# Patient Record
Sex: Female | Born: 1945 | Race: White | Hispanic: No | Marital: Married | Smoking: Never smoker
Health system: Southern US, Community
[De-identification: ages and names within clinical notes are randomized; demographics above are authoritative.]

## PROBLEM LIST (undated history)

## (undated) ENCOUNTER — Inpatient Hospital Stay: Payer: Medicare Other

## (undated) DIAGNOSIS — I509 Heart failure, unspecified: Secondary | ICD-10-CM

## (undated) DIAGNOSIS — C801 Malignant (primary) neoplasm, unspecified: Secondary | ICD-10-CM

## (undated) DIAGNOSIS — B019 Varicella without complication: Secondary | ICD-10-CM

## (undated) DIAGNOSIS — I00 Rheumatic fever without heart involvement: Secondary | ICD-10-CM

## (undated) DIAGNOSIS — E119 Type 2 diabetes mellitus without complications: Secondary | ICD-10-CM

## (undated) DIAGNOSIS — I219 Acute myocardial infarction, unspecified: Secondary | ICD-10-CM

## (undated) DIAGNOSIS — G4730 Sleep apnea, unspecified: Secondary | ICD-10-CM

## (undated) DIAGNOSIS — N390 Urinary tract infection, site not specified: Secondary | ICD-10-CM

## (undated) DIAGNOSIS — Z9861 Coronary angioplasty status: Secondary | ICD-10-CM

## (undated) DIAGNOSIS — C50919 Malignant neoplasm of unspecified site of unspecified female breast: Secondary | ICD-10-CM

## (undated) DIAGNOSIS — I519 Heart disease, unspecified: Secondary | ICD-10-CM

## (undated) DIAGNOSIS — I2510 Atherosclerotic heart disease of native coronary artery without angina pectoris: Secondary | ICD-10-CM

## (undated) DIAGNOSIS — N1831 Chronic kidney disease, stage 3a: Secondary | ICD-10-CM

## (undated) DIAGNOSIS — K219 Gastro-esophageal reflux disease without esophagitis: Secondary | ICD-10-CM

## (undated) DIAGNOSIS — Z973 Presence of spectacles and contact lenses: Secondary | ICD-10-CM

## (undated) DIAGNOSIS — M8580 Other specified disorders of bone density and structure, unspecified site: Secondary | ICD-10-CM

## (undated) DIAGNOSIS — J449 Chronic obstructive pulmonary disease, unspecified: Secondary | ICD-10-CM

## (undated) DIAGNOSIS — M1990 Unspecified osteoarthritis, unspecified site: Secondary | ICD-10-CM

## (undated) DIAGNOSIS — E785 Hyperlipidemia, unspecified: Secondary | ICD-10-CM

## (undated) DIAGNOSIS — I10 Essential (primary) hypertension: Secondary | ICD-10-CM

## (undated) DIAGNOSIS — K635 Polyp of colon: Secondary | ICD-10-CM

## (undated) DIAGNOSIS — I208 Other forms of angina pectoris: Secondary | ICD-10-CM

## (undated) DIAGNOSIS — T7840XA Allergy, unspecified, initial encounter: Secondary | ICD-10-CM

## (undated) DIAGNOSIS — T8859XA Other complications of anesthesia, initial encounter: Secondary | ICD-10-CM

## (undated) HISTORY — DX: Urinary tract infection, site not specified: N39.0

## (undated) HISTORY — DX: Malignant (primary) neoplasm, unspecified: C80.1

## (undated) HISTORY — DX: Unspecified osteoarthritis, unspecified site: M19.90

## (undated) HISTORY — PX: TUBAL LIGATION: SHX77

## (undated) HISTORY — DX: Essential (primary) hypertension: I10

## (undated) HISTORY — DX: Heart disease, unspecified: I51.9

## (undated) HISTORY — PX: TONSILLECTOMY: SUR1361

## (undated) HISTORY — DX: Other specified disorders of bone density and structure, unspecified site: M85.80

## (undated) HISTORY — DX: Polyp of colon: K63.5

## (undated) HISTORY — DX: Rheumatic fever without heart involvement: I00

## (undated) HISTORY — DX: Hyperlipidemia, unspecified: E78.5

## (undated) HISTORY — DX: Varicella without complication: B01.9

## (undated) HISTORY — DX: Chronic obstructive pulmonary disease, unspecified: J44.9

## (undated) HISTORY — PX: BREAST BIOPSY: SHX20

## (undated) HISTORY — DX: Heart failure, unspecified: I50.9

## (undated) HISTORY — DX: Allergy, unspecified, initial encounter: T78.40XA

## (undated) HISTORY — PX: BREAST SURGERY: SHX581

## (undated) HISTORY — DX: Atherosclerotic heart disease of native coronary artery without angina pectoris: I25.10

## (undated) HISTORY — DX: Gastro-esophageal reflux disease without esophagitis: K21.9

---
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
## 1989-11-16 DIAGNOSIS — C50919 Malignant neoplasm of unspecified site of unspecified female breast: Secondary | ICD-10-CM

## 1989-11-16 DIAGNOSIS — C801 Malignant (primary) neoplasm, unspecified: Secondary | ICD-10-CM

## 1989-11-16 HISTORY — DX: Malignant neoplasm of unspecified site of unspecified female breast: C50.919

## 1989-11-16 HISTORY — PX: MASTECTOMY: SHX3

## 1989-11-16 HISTORY — DX: Malignant (primary) neoplasm, unspecified: C80.1

## 2004-09-30 ENCOUNTER — Ambulatory Visit: Payer: Self-pay

## 2004-10-31 ENCOUNTER — Ambulatory Visit: Payer: Self-pay

## 2005-07-02 ENCOUNTER — Encounter: Payer: Self-pay | Admitting: General Practice

## 2005-07-17 ENCOUNTER — Encounter: Payer: Self-pay | Admitting: General Practice

## 2005-12-23 ENCOUNTER — Ambulatory Visit: Payer: Self-pay | Admitting: Family Medicine

## 2006-01-27 ENCOUNTER — Ambulatory Visit: Payer: Self-pay | Admitting: Otolaryngology

## 2006-09-27 ENCOUNTER — Ambulatory Visit: Payer: Self-pay | Admitting: Gynecology

## 2006-10-18 ENCOUNTER — Ambulatory Visit: Payer: Self-pay | Admitting: Gynecology

## 2006-11-16 DIAGNOSIS — I2510 Atherosclerotic heart disease of native coronary artery without angina pectoris: Secondary | ICD-10-CM

## 2006-11-16 HISTORY — DX: Atherosclerotic heart disease of native coronary artery without angina pectoris: I25.10

## 2007-02-10 ENCOUNTER — Ambulatory Visit: Payer: Self-pay | Admitting: Internal Medicine

## 2007-02-11 ENCOUNTER — Other Ambulatory Visit: Payer: Self-pay

## 2007-02-11 ENCOUNTER — Inpatient Hospital Stay: Payer: Self-pay | Admitting: Internal Medicine

## 2007-02-13 ENCOUNTER — Other Ambulatory Visit: Payer: Self-pay

## 2007-02-13 ENCOUNTER — Inpatient Hospital Stay: Payer: Self-pay | Admitting: Internal Medicine

## 2007-03-02 ENCOUNTER — Ambulatory Visit: Payer: Self-pay | Admitting: Gastroenterology

## 2007-03-03 ENCOUNTER — Ambulatory Visit: Payer: Self-pay | Admitting: Gastroenterology

## 2007-03-17 ENCOUNTER — Encounter: Payer: Self-pay | Admitting: Internal Medicine

## 2007-04-17 ENCOUNTER — Encounter: Payer: Self-pay | Admitting: Internal Medicine

## 2007-05-17 ENCOUNTER — Encounter: Payer: Self-pay | Admitting: Internal Medicine

## 2007-06-16 ENCOUNTER — Ambulatory Visit: Payer: Self-pay | Admitting: Internal Medicine

## 2007-06-17 ENCOUNTER — Encounter: Payer: Self-pay | Admitting: Internal Medicine

## 2007-07-18 ENCOUNTER — Encounter: Payer: Self-pay | Admitting: Internal Medicine

## 2007-08-04 ENCOUNTER — Ambulatory Visit: Payer: Self-pay | Admitting: Family Medicine

## 2007-08-17 ENCOUNTER — Encounter: Payer: Self-pay | Admitting: Internal Medicine

## 2007-10-04 ENCOUNTER — Ambulatory Visit: Payer: Self-pay | Admitting: Urology

## 2007-10-12 ENCOUNTER — Inpatient Hospital Stay: Payer: Self-pay | Admitting: Internal Medicine

## 2007-10-12 ENCOUNTER — Other Ambulatory Visit: Payer: Self-pay

## 2007-10-20 ENCOUNTER — Other Ambulatory Visit: Payer: Self-pay

## 2007-10-21 ENCOUNTER — Ambulatory Visit: Payer: Self-pay | Admitting: Family Medicine

## 2007-11-01 ENCOUNTER — Ambulatory Visit: Payer: Self-pay | Admitting: Family Medicine

## 2007-11-16 ENCOUNTER — Ambulatory Visit: Payer: Self-pay | Admitting: Family Medicine

## 2008-01-31 ENCOUNTER — Ambulatory Visit: Payer: Self-pay | Admitting: Internal Medicine

## 2008-02-15 DIAGNOSIS — K635 Polyp of colon: Secondary | ICD-10-CM

## 2008-02-15 HISTORY — DX: Polyp of colon: K63.5

## 2008-02-21 ENCOUNTER — Ambulatory Visit: Payer: Self-pay | Admitting: Gastroenterology

## 2008-06-06 ENCOUNTER — Ambulatory Visit: Payer: Self-pay | Admitting: Internal Medicine

## 2008-10-17 ENCOUNTER — Ambulatory Visit: Payer: Self-pay | Admitting: Internal Medicine

## 2008-11-26 ENCOUNTER — Ambulatory Visit: Payer: Self-pay | Admitting: Internal Medicine

## 2008-12-05 LAB — HM COLONOSCOPY: HM Colonoscopy: NORMAL

## 2009-02-19 ENCOUNTER — Ambulatory Visit: Payer: Self-pay | Admitting: Internal Medicine

## 2009-02-26 ENCOUNTER — Ambulatory Visit: Payer: Self-pay | Admitting: Internal Medicine

## 2009-08-16 ENCOUNTER — Ambulatory Visit: Payer: Self-pay | Admitting: Oncology

## 2009-08-30 ENCOUNTER — Ambulatory Visit: Payer: Self-pay | Admitting: Oncology

## 2009-09-16 ENCOUNTER — Ambulatory Visit: Payer: Self-pay | Admitting: Oncology

## 2009-10-21 ENCOUNTER — Ambulatory Visit: Payer: Self-pay | Admitting: Internal Medicine

## 2009-11-16 DIAGNOSIS — M8580 Other specified disorders of bone density and structure, unspecified site: Secondary | ICD-10-CM

## 2009-11-16 HISTORY — DX: Other specified disorders of bone density and structure, unspecified site: M85.80

## 2009-11-27 ENCOUNTER — Ambulatory Visit: Payer: Self-pay | Admitting: Internal Medicine

## 2010-03-10 ENCOUNTER — Other Ambulatory Visit: Payer: Self-pay | Admitting: Internal Medicine

## 2010-05-13 ENCOUNTER — Other Ambulatory Visit: Payer: Self-pay | Admitting: Internal Medicine

## 2010-05-13 ENCOUNTER — Ambulatory Visit: Payer: Self-pay | Admitting: Internal Medicine

## 2010-06-02 ENCOUNTER — Ambulatory Visit: Payer: Self-pay | Admitting: Internal Medicine

## 2010-12-01 ENCOUNTER — Ambulatory Visit: Payer: Self-pay | Admitting: Internal Medicine

## 2011-02-10 ENCOUNTER — Other Ambulatory Visit: Payer: Self-pay | Admitting: Internal Medicine

## 2011-08-04 ENCOUNTER — Other Ambulatory Visit: Payer: Self-pay | Admitting: Internal Medicine

## 2011-08-05 MED ORDER — OMEPRAZOLE 20 MG PO CPDR: 20.0000 mg | DELAYED_RELEASE_CAPSULE | Freq: Every day | ORAL | Status: DC

## 2011-11-04 ENCOUNTER — Other Ambulatory Visit: Payer: Self-pay | Admitting: *Deleted

## 2011-11-04 MED ORDER — AMLODIPINE BESYLATE 5 MG PO TABS: 5.0000 mg | ORAL_TABLET | Freq: Every day | ORAL | Status: DC

## 2011-11-04 MED ORDER — SPIRONOLACTONE 50 MG PO TABS: 50.0000 mg | ORAL_TABLET | Freq: Every day | ORAL | Status: DC

## 2011-11-17 HISTORY — PX: CORONARY ANGIOPLASTY WITH STENT PLACEMENT: SHX49

## 2011-11-20 ENCOUNTER — Encounter: Payer: Self-pay | Admitting: Internal Medicine

## 2011-11-20 ENCOUNTER — Ambulatory Visit (INDEPENDENT_AMBULATORY_CARE_PROVIDER_SITE_OTHER): Payer: PRIVATE HEALTH INSURANCE | Admitting: Internal Medicine

## 2011-11-20 DIAGNOSIS — M810 Age-related osteoporosis without current pathological fracture: Secondary | ICD-10-CM

## 2011-11-20 DIAGNOSIS — I209 Angina pectoris, unspecified: Secondary | ICD-10-CM

## 2011-11-20 DIAGNOSIS — Z13220 Encounter for screening for lipoid disorders: Secondary | ICD-10-CM

## 2011-11-20 DIAGNOSIS — I208 Other forms of angina pectoris: Secondary | ICD-10-CM

## 2011-11-20 DIAGNOSIS — Z0000 Encounter for general adult medical examination without abnormal findings: Secondary | ICD-10-CM

## 2011-11-20 DIAGNOSIS — Z124 Encounter for screening for malignant neoplasm of cervix: Secondary | ICD-10-CM

## 2011-11-20 DIAGNOSIS — M8580 Other specified disorders of bone density and structure, unspecified site: Secondary | ICD-10-CM

## 2011-11-20 DIAGNOSIS — Z1211 Encounter for screening for malignant neoplasm of colon: Secondary | ICD-10-CM

## 2011-11-20 DIAGNOSIS — H6690 Otitis media, unspecified, unspecified ear: Secondary | ICD-10-CM

## 2011-11-20 DIAGNOSIS — M949 Disorder of cartilage, unspecified: Secondary | ICD-10-CM

## 2011-11-20 DIAGNOSIS — H748X9 Other specified disorders of middle ear and mastoid, unspecified ear: Secondary | ICD-10-CM

## 2011-11-20 DIAGNOSIS — E785 Hyperlipidemia, unspecified: Secondary | ICD-10-CM

## 2011-11-20 DIAGNOSIS — C801 Malignant (primary) neoplasm, unspecified: Secondary | ICD-10-CM

## 2011-11-20 MED ORDER — ISOSORBIDE MONONITRATE ER 30 MG PO TB24: 60.0000 mg | ORAL_TABLET | Freq: Every day | ORAL | Status: DC

## 2011-11-20 MED ORDER — HYDROCODONE-ACETAMINOPHEN 5-500 MG PO TABS: 1.0000 | ORAL_TABLET | Freq: Four times a day (QID) | ORAL | Status: AC | PRN

## 2011-11-20 MED ORDER — AMOXICILLIN-POT CLAVULANATE 875-125 MG PO TABS: 1.0000 | ORAL_TABLET | Freq: Two times a day (BID) | ORAL | Status: AC

## 2011-11-20 NOTE — Progress Notes (Signed)
Subjective:    Patient ID: Stacie Mcdonald, female    DOB: [birth year], 66 y.o.   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  She has 3 complaints today.  Her first is that she has had sinus congestion, cough, voice changes and left ear pain for over 2 weeks with no improvement except in the ear pain which increased initially and then imporved a few days ago. Her second complaint is of chronic neck pain accompanied by decreased and painful ROM.  She has a history of prophylactic mastectomy and attributes the pain to the disruption of muscles on her anterior chest and shoulder.  The pain is improved with weekly massage. Her third compliant is neck and jaw pain that is brought on with mild exertion.  It has been occurring several times per day , without diaphoresis or nausea.  She is scheduled to see he cardiologist on Monday.   Past Medical History  Diagnosis Date  . Arthritis   . Asthma   . Cancer   . Chicken pox   . GERD (gastroesophageal reflux disease)   . Allergy   . Heart disease   . Hypertension   . Hyperlipidemia   . Rheumatic fever   . Urinary tract infection   . CAD (coronary artery disease)     50% LAD occlusion 2009,  stent in mid LAD 2008 for 95%     Past Surgical History  Procedure Date  . Breast surgery     right mastectomy    Review of Systems  Constitutional: Negative for fever, chills, appetite change, fatigue and unexpected weight change.  HENT: Negative for ear pain, congestion, sore throat, trouble swallowing, neck pain, voice change and sinus pressure.   Eyes: Negative for visual disturbance.  Respiratory: Negative for cough, shortness of breath, wheezing and stridor.   Cardiovascular: Negative for chest pain, palpitations and leg swelling.  Gastrointestinal: Negative for nausea, vomiting, abdominal pain, diarrhea, constipation, blood in stool, abdominal distention and anal  bleeding.  Genitourinary: Negative for dysuria and flank pain.  Musculoskeletal: Negative for myalgias, arthralgias and gait problem.  Skin: Negative for color change and rash.  Neurological: Negative for dizziness and headaches.  Hematological: Negative for adenopathy. Does not bruise/bleed easily.  Psychiatric/Behavioral: Negative for suicidal ideas, sleep disturbance and dysphoric mood. The patient is not nervous/anxious.    BP 140/86  Pulse 96  Temp(Src) 98.6 F (37 C) (Oral)  Resp 14  Ht 5\' 4"  (1.626 m)  Wt 173 lb (78.472 kg)  BMI 29.70 kg/m2  SpO2 97%     Objective:   Physical Exam  Constitutional: She is oriented to person, place, and time. She appears well-developed and well-nourished.  HENT:  Right Ear: Hearing and tympanic membrane normal.  Left Ear: There is hemotympanum.  Mouth/Throat: Oropharynx is clear and moist.  Eyes: EOM are normal. Pupils are equal, round, and reactive to light. No scleral icterus.  Neck: Normal range of motion. Neck supple. No JVD present. No thyromegaly present.  Cardiovascular: Normal rate, regular rhythm, normal heart sounds and intact distal pulses.   Pulmonary/Chest: Effort normal and breath sounds normal.  Abdominal: Soft. Bowel sounds are normal. She exhibits no mass. There is no tenderness.  Genitourinary: Vagina normal and uterus normal. No vaginal discharge found.  Musculoskeletal: Normal range of motion. She exhibits no edema.  Lymphadenopathy:    She  has no cervical adenopathy.  Neurological: She is alert and oriented to person, place, and time.  Skin: Skin is warm and dry.  Psychiatric: She has a normal mood and affect.       Assessment & Plan:   Angina of effort She is fully aware that her current symptoms are highly suggestive of angian adn is scheduled to see Dr. Juliann Pares on Monday/  Hemotympanum Her TM appears intact but is blood red.  Given recent symptoms, I suspect she ruptured and has started to heal.  will cover  with augmentin and decongestant and refer to Vernie Murders for evaluation.   Screening for cervical cancer She is not due for PAP smear, so pelvic was deferred.   Hyperlipidemia LDL goal < 70 LDL was 70, HDL 47 by March 2012.  Normal LFTs  Osteopenia due to cancer therapy T score -2.1 by last DEXA Jan 2011.  Continue alendronate,  Repeat this year  Breast Cancer S/p Right mastectomy, XRT and chemo in in 1991. Unilateral mammogams on left due every Jan .     Updated Medication List Outpatient Encounter Prescriptions as of 11/20/2011  Medication Sig Dispense Refill  . alendronate (FOSAMAX) 70 MG tablet Take 70 mg by mouth every 7 (seven) days. Take with a full glass of water on an empty stomach.       Marland Kitchen amLODipine (NORVASC) 5 MG tablet Take 1 tablet (5 mg total) by mouth daily.  30 tablet  11  . aspirin 81 MG tablet Take 160 mg by mouth daily.        . budesonide-formoterol (SYMBICORT) 80-4.5 MCG/ACT inhaler Inhale into the lungs. 2 inhalations 2 x day       . clopidogrel (PLAVIX) 75 MG tablet Take 75 mg by mouth daily.        . isosorbide mononitrate (IMDUR) 30 MG 24 hr tablet Take 2 tablets (60 mg total) by mouth daily.  30 tablet  11  . levalbuterol (XOPENEX HFA) 45 MCG/ACT inhaler Inhale 1-2 puffs into the lungs every 4 (four) hours as needed.        Marland Kitchen losartan (COZAAR) 100 MG tablet Take 100 mg by mouth daily.        . metoprolol (TOPROL XL) 200 MG 24 hr tablet Take 200 mg by mouth daily.        . montelukast (SINGULAIR) 10 MG tablet Take 10 mg by mouth daily.        . niacin 500 MG tablet Take 500 mg by mouth daily.        . nitrofurantoin (MACRODANTIN) 100 MG capsule Take 100 mg by mouth daily.        Marland Kitchen omeprazole (PRILOSEC) 20 MG capsule Take 1 capsule (20 mg total) by mouth daily.  30 capsule  3  . pravastatin (PRAVACHOL) 40 MG tablet Take 40 mg by mouth daily.        Marland Kitchen spironolactone (ALDACTONE) 50 MG tablet Take 1 tablet (50 mg total) by mouth daily.  30 tablet  2  . DISCONTD:  isosorbide mononitrate (IMDUR) 30 MG 24 hr tablet Take 30 mg by mouth daily.        Marland Kitchen amoxicillin-clavulanate (AUGMENTIN) 875-125 MG per tablet Take 1 tablet by mouth 2 (two) times daily.  14 tablet  0  . HYDROcodone-acetaminophen (VICODIN) 5-500 MG per tablet Take 1 tablet by mouth every 6 (six) hours as needed (cough).  30 tablet  0

## 2011-11-22 ENCOUNTER — Encounter: Payer: Self-pay | Admitting: Internal Medicine

## 2011-11-22 DIAGNOSIS — E785 Hyperlipidemia, unspecified: Secondary | ICD-10-CM | POA: Insufficient documentation

## 2011-11-22 DIAGNOSIS — Z860101 Personal history of adenomatous and serrated colon polyps: Secondary | ICD-10-CM | POA: Insufficient documentation

## 2011-11-22 DIAGNOSIS — Z86010 Personal history of colonic polyps: Secondary | ICD-10-CM | POA: Insufficient documentation

## 2011-11-22 DIAGNOSIS — Z853 Personal history of malignant neoplasm of breast: Secondary | ICD-10-CM | POA: Insufficient documentation

## 2011-11-22 DIAGNOSIS — I208 Other forms of angina pectoris: Secondary | ICD-10-CM | POA: Insufficient documentation

## 2011-11-22 DIAGNOSIS — M810 Age-related osteoporosis without current pathological fracture: Secondary | ICD-10-CM | POA: Insufficient documentation

## 2011-11-22 DIAGNOSIS — I2089 Other forms of angina pectoris: Secondary | ICD-10-CM | POA: Insufficient documentation

## 2011-11-22 DIAGNOSIS — Z124 Encounter for screening for malignant neoplasm of cervix: Secondary | ICD-10-CM | POA: Insufficient documentation

## 2011-11-22 DIAGNOSIS — I2510 Atherosclerotic heart disease of native coronary artery without angina pectoris: Secondary | ICD-10-CM | POA: Insufficient documentation

## 2011-11-22 DIAGNOSIS — M818 Other osteoporosis without current pathological fracture: Secondary | ICD-10-CM | POA: Insufficient documentation

## 2011-11-22 DIAGNOSIS — H748X9 Other specified disorders of middle ear and mastoid, unspecified ear: Secondary | ICD-10-CM | POA: Insufficient documentation

## 2011-11-22 NOTE — Patient Instructions (Signed)
You left eardrum appears to have ruptured.  I am treating you with antibiotics and decongnestantn adn referring you to Dr. Elenore Rota for further evaluation

## 2011-11-22 NOTE — Assessment & Plan Note (Signed)
LDL was 70, HDL 47 by March 2012.  Normal LFTs

## 2011-11-22 NOTE — Assessment & Plan Note (Signed)
S/p Right mastectomy, XRT and chemo in in 1991. Unilateral mammogams on left due every Jan .

## 2011-11-22 NOTE — Assessment & Plan Note (Signed)
T score -2.1 by last DEXA Jan 2011.  Continue alendronate,  Repeat this year

## 2011-11-22 NOTE — Assessment & Plan Note (Deleted)
By prior DEXA,  Continue alendronate.  Will request prior report

## 2011-11-22 NOTE — Assessment & Plan Note (Signed)
She is fully aware that her current symptoms are highly suggestive of angian adn is scheduled to see Dr. Juliann Pares on Monday/

## 2011-11-22 NOTE — Assessment & Plan Note (Signed)
Her TM appears intact but is blood red.  Given recent symptoms, I suspect she ruptured and has started to heal.  will cover with augmentin and decongestant and refer to Vernie Murders for evaluation.

## 2011-11-22 NOTE — Assessment & Plan Note (Signed)
She is not due for PAP smear, so pelvic was deferred.

## 2011-11-23 ENCOUNTER — Other Ambulatory Visit (HOSPITAL_COMMUNITY)
Admission: RE | Admit: 2011-11-23 | Discharge: 2011-11-23 | Disposition: A | Discharge status: Home / Self Care | Payer: PRIVATE HEALTH INSURANCE | Source: Ambulatory Visit | Attending: Internal Medicine | Admitting: Internal Medicine

## 2011-11-23 ENCOUNTER — Other Ambulatory Visit: Payer: Self-pay | Admitting: Internal Medicine

## 2011-11-23 DIAGNOSIS — Z1159 Encounter for screening for other viral diseases: Secondary | ICD-10-CM | POA: Insufficient documentation

## 2011-11-23 DIAGNOSIS — Z01419 Encounter for gynecological examination (general) (routine) without abnormal findings: Secondary | ICD-10-CM | POA: Insufficient documentation

## 2011-11-23 LAB — CBC WITH DIFFERENTIAL/PLATELET
Basophil %: 0.4 %
Eosinophil #: 0.3 10*3/uL (ref 0.0–0.7)
Eosinophil %: 3.1 %
HCT: 39.6 % (ref 35.0–47.0)
Lymphocyte #: 2 10*3/uL (ref 1.0–3.6)
Lymphocyte %: 22.2 %
MCV: 90 fL (ref 80–100)
Monocyte %: 7.5 %
Neutrophil #: 5.9 10*3/uL (ref 1.4–6.5)
Neutrophil %: 66.8 %
Platelet: 267 10*3/uL (ref 150–440)
RBC: 4.41 10*6/uL (ref 3.80–5.20)
RDW: 13.6 % (ref 11.5–14.5)
WBC: 8.9 10*3/uL (ref 3.6–11.0)

## 2011-11-23 LAB — BASIC METABOLIC PANEL
BUN: 12 mg/dL (ref 7–18)
Chloride: 106 mmol/L (ref 98–107)
Creatinine: 0.56 mg/dL — ABNORMAL LOW (ref 0.60–1.30)
EGFR (African American): >60
EGFR (Non-African Amer.): >60
Glucose: 106 mg/dL — ABNORMAL HIGH (ref 65–99)
Potassium: 4 mmol/L (ref 3.5–5.1)

## 2011-11-23 NOTE — Progress Notes (Signed)
Addended by: Jobie Quaker on: 11/23/2011 10:56 AM   Modules accepted: Orders

## 2011-11-24 ENCOUNTER — Ambulatory Visit: Payer: Self-pay | Admitting: Internal Medicine

## 2011-11-25 ENCOUNTER — Ambulatory Visit: Payer: Self-pay | Admitting: Internal Medicine

## 2011-11-26 ENCOUNTER — Telehealth: Payer: Self-pay | Admitting: Internal Medicine

## 2011-11-26 DIAGNOSIS — C50919 Malignant neoplasm of unspecified site of unspecified female breast: Secondary | ICD-10-CM

## 2011-11-26 LAB — BASIC METABOLIC PANEL
BUN: 16 mg/dL (ref 7–18)
Calcium, Total: 8.5 mg/dL (ref 8.5–10.1)
Co2: 29 mmol/L (ref 21–32)
EGFR (African American): >60
EGFR (Non-African Amer.): >60
Glucose: 104 mg/dL — ABNORMAL HIGH (ref 65–99)
Osmolality: 283 (ref 275–301)
Potassium: 4 mmol/L (ref 3.5–5.1)

## 2011-11-26 LAB — CK TOTAL AND CKMB (NOT AT ARMC): CK, Total: 119 U/L (ref 21–215)

## 2011-11-26 NOTE — Telephone Encounter (Signed)
[phone] Pt called wants an order for mammogram  Pt will make her own appointment at [facility]  Pt needs rx for test strips and lancets The gluco machine is   One touch  One touch ultra mini   Test strips delica lancet  [facility]

## 2011-11-27 ENCOUNTER — Other Ambulatory Visit: Payer: Self-pay | Admitting: *Deleted

## 2011-11-27 MED ORDER — GLUCOSE BLOOD VI STRP: ORAL_STRIP | Status: AC

## 2011-11-27 MED ORDER — ONETOUCH DELICA LANCETS MISC: Status: DC

## 2011-11-27 NOTE — Telephone Encounter (Signed)
rx for mammogram on printer

## 2011-11-27 NOTE — Telephone Encounter (Signed)
Test strips and lancets called to [facility] pharmacy.  Pt still needs order for mammogram.

## 2011-12-01 ENCOUNTER — Telehealth: Payer: Self-pay | Admitting: *Deleted

## 2011-12-01 DIAGNOSIS — C50919 Malignant neoplasm of unspecified site of unspecified female breast: Secondary | ICD-10-CM

## 2011-12-01 NOTE — Telephone Encounter (Signed)
On printer

## 2011-12-01 NOTE — Telephone Encounter (Signed)
Order was sent to [facility] for a screening mammogram, pt needs a diagnostic due to her history of breast cancer.  Please re-order.  Pt has appt on 2/25 at 2:00.

## 2011-12-04 NOTE — Telephone Encounter (Signed)
Order faxed to [facility] 

## 2011-12-15 ENCOUNTER — Other Ambulatory Visit: Payer: Self-pay | Admitting: *Deleted

## 2011-12-15 MED ORDER — NIACIN 500 MG PO TABS: 500.0000 mg | ORAL_TABLET | Freq: Every day | ORAL | Status: DC

## 2011-12-18 ENCOUNTER — Ambulatory Visit: Payer: Self-pay | Admitting: Internal Medicine

## 2012-01-11 ENCOUNTER — Ambulatory Visit: Payer: Self-pay | Admitting: Internal Medicine

## 2012-01-11 ENCOUNTER — Telehealth: Payer: Self-pay | Admitting: Internal Medicine

## 2012-01-11 MED ORDER — ALPRAZOLAM 0.5 MG PO TBDP: 0.5000 mg | ORAL_TABLET | Freq: Every evening | ORAL | Status: AC | PRN

## 2012-01-11 NOTE — Telephone Encounter (Signed)
[phone] Pt came in  Pt is flying to [place]  Pt hates flying.  She would like to get something for her nerves and something to help her sleep [facility] Pt stated she always gets an upper resportory infection from flying and wanted to know if she could get something to help prevent this

## 2012-01-11 NOTE — Telephone Encounter (Signed)
Patient notified of message.  Rx has been called in.

## 2012-01-11 NOTE — Telephone Encounter (Signed)
There is nothing to prevent a respiratory infection ; we do not recommend prophylactic abx.  She should take Purell with her,  Turn the air vent off above her seat,  And take a mask with her to wear if she is seated near someone who is coughing.  You can call her in alprazolam 0.5 mg one tablet daily as needed for anxiety or sleep.  #20 no refills

## 2012-01-15 ENCOUNTER — Ambulatory Visit: Payer: Self-pay | Admitting: Internal Medicine

## 2012-01-25 ENCOUNTER — Encounter: Payer: Self-pay | Admitting: Internal Medicine

## 2012-02-08 ENCOUNTER — Other Ambulatory Visit: Payer: Self-pay | Admitting: Internal Medicine

## 2012-02-08 MED ORDER — LOSARTAN POTASSIUM 100 MG PO TABS: 100.0000 mg | ORAL_TABLET | Freq: Every day | ORAL | Status: DC

## 2012-02-08 MED ORDER — METOPROLOL SUCCINATE ER 200 MG PO TB24: 200.0000 mg | ORAL_TABLET | Freq: Every day | ORAL | Status: DC

## 2012-02-08 MED ORDER — ALENDRONATE SODIUM 70 MG PO TABS: 70.0000 mg | ORAL_TABLET | ORAL | Status: DC

## 2012-02-08 MED ORDER — SPIRONOLACTONE 50 MG PO TABS: 50.0000 mg | ORAL_TABLET | Freq: Every day | ORAL | Status: DC

## 2012-02-08 MED ORDER — OMEPRAZOLE 20 MG PO CPDR: 20.0000 mg | DELAYED_RELEASE_CAPSULE | Freq: Every day | ORAL | Status: DC

## 2012-03-30 ENCOUNTER — Ambulatory Visit: Payer: Self-pay | Admitting: Urology

## 2012-04-05 ENCOUNTER — Telehealth: Payer: Self-pay | Admitting: Internal Medicine

## 2012-04-05 DIAGNOSIS — S22009A Unspecified fracture of unspecified thoracic vertebra, initial encounter for closed fracture: Secondary | ICD-10-CM | POA: Insufficient documentation

## 2012-04-05 HISTORY — DX: Unspecified fracture of unspecified thoracic vertebra, initial encounter for closed fracture: S22.009A

## 2012-04-05 NOTE — Telephone Encounter (Signed)
Vertebral fractures should heal on their own.   If she is having a lot of pain she needs to be seen so we can deal with it

## 2012-04-05 NOTE — Telephone Encounter (Signed)
[phone] Pt came in.  She had xray last Thursday @ [facility] Dr cope ordered this.  She had a compress fracture on the t11.  Pt wanted to know if she needed to do anything about this or will it heal on its own.

## 2012-04-05 NOTE — Telephone Encounter (Signed)
Never mind about "if"" ,  She needs to be seen, unless her oncologist is handling the workup of the fracture. Stacie Mcdonald

## 2012-04-06 NOTE — Telephone Encounter (Signed)
Left message asking patient to return my call.

## 2012-04-07 ENCOUNTER — Telehealth: Payer: Self-pay | Admitting: Internal Medicine

## 2012-04-07 NOTE — Telephone Encounter (Signed)
Returning a call from [employer] at office from yesterday.  Has a compression fracture in T-11.  Wants to know if there is anything she needs to do for this.  PLEASE CALL PT BACK AT [phone] (WORK NUMBER UNTIL 6 PM).

## 2012-04-07 NOTE — Telephone Encounter (Signed)
Please call patient back again.  My message befoe is the same,. i would like to know if her oncologisit is aware of the fracture and she needs to see either him or me.

## 2012-04-07 NOTE — Telephone Encounter (Signed)
She stated no one is handling the work up, appt has been scheduled with Dr. Darrick Huntsman

## 2012-04-07 NOTE — Telephone Encounter (Signed)
Spoke with patient, she made an appt with Dr. Darrick Huntsman

## 2012-04-12 ENCOUNTER — Encounter: Payer: Self-pay | Admitting: Internal Medicine

## 2012-04-12 ENCOUNTER — Ambulatory Visit (INDEPENDENT_AMBULATORY_CARE_PROVIDER_SITE_OTHER): Payer: PRIVATE HEALTH INSURANCE | Admitting: Internal Medicine

## 2012-04-12 VITALS — BP 140/76 | HR 86 | Temp 98.7°F | Resp 16 | Wt 178.2 lb

## 2012-04-12 DIAGNOSIS — M542 Cervicalgia: Secondary | ICD-10-CM

## 2012-04-12 DIAGNOSIS — S22009A Unspecified fracture of unspecified thoracic vertebra, initial encounter for closed fracture: Secondary | ICD-10-CM

## 2012-04-12 DIAGNOSIS — G8929 Other chronic pain: Secondary | ICD-10-CM

## 2012-04-12 DIAGNOSIS — M8580 Other specified disorders of bone density and structure, unspecified site: Secondary | ICD-10-CM

## 2012-04-12 DIAGNOSIS — I2510 Atherosclerotic heart disease of native coronary artery without angina pectoris: Secondary | ICD-10-CM

## 2012-04-12 DIAGNOSIS — M899 Disorder of bone, unspecified: Secondary | ICD-10-CM

## 2012-04-12 DIAGNOSIS — I10 Essential (primary) hypertension: Secondary | ICD-10-CM

## 2012-04-12 MED ORDER — DIAZEPAM 5 MG PO TABS: 5.0000 mg | ORAL_TABLET | Freq: Two times a day (BID) | ORAL | Status: DC | PRN

## 2012-04-12 MED ORDER — TRAMADOL HCL 50 MG PO TABS: 50.0000 mg | ORAL_TABLET | Freq: Four times a day (QID) | ORAL | Status: AC | PRN

## 2012-04-12 NOTE — Patient Instructions (Addendum)
We are ordering plain films of neck,  Followed by cervical and thoracic MRI to look more closely at that fracture.  We need some fasting labs, and vitamin d level   You can use the valium for muscle spasm and to help you relax ,.  You can use tramadol to help with pain (ok to combine with ibuprofen/motrin/alleve) or with tylenol

## 2012-04-12 NOTE — Progress Notes (Signed)
Patient ID: Stacie Mcdonald, female   DOB: [birth year], 66 y.o.   MRN: [id number] Patient Active Problem List  Diagnoses  . Angina of effort  . Hemotympanum  . Screening for cervical cancer  . Breast Cancer  . CAD (coronary artery disease)  . Colon polyps  . Osteopenia due to cancer therapy  . Hyperlipidemia LDL goal < 70  . Thoracic vertebral fracture  . Accelerated hypertension  . Vertebral compression fracture, initial encounter  . Chronic neck pain    Subjective:  CC:   Chief Complaint  Patient presents with  . Vertebral Fracture    T11    HPI:   Stacie Mcdonald a 66 y.o. female who presents For initial encounter following the report of a T12 vertebral fracture found during routine screening cxr done by Dr. Achilles Dunk due to chronic use of Septra.  She had no history of trauma but had reported a 3 week history of severe lower thoracic back pain .  She has been exercising with a community program called [employer] 4 days per week on a concrete floor and had attributed her fracture to the impact of jumping on this hard surface. .   Separate issue is 3 month history of intermittent neck pain which radiates to both shoulders accompanied by episodes of numbness in both hands.  The numbness is always more pronounced in the morning. She has no history of neck injury, carpal tunnel syndrome, or shoulder injruy.  There has been no prior evaluation .    Past Medical History  Diagnosis Date  . Arthritis   . Asthma   . Chicken pox   . GERD (gastroesophageal reflux disease)   . Allergy   . Heart disease   . Hypertension   . Hyperlipidemia   . Rheumatic fever   . Urinary tract infection   . CAD (coronary artery disease) 2008    50% LAD occlusion 2009,  stent in mid LAD 2008 for 95%   . Colon polyps April 2009    by Colonoscopy, Marva Panda  . Osteopenia due to cancer therapy Jan 2011    T score -2.1  . Breast Cancer 1991    R mastectomy. XRT , chemo    Past Surgical History  Procedure  Date  . Breast surgery     right mastectomy  . Coronary angioplasty with stent placement Jan 2013    mid RCA 75% occlusion resolved to 0%, [facility]         The following portions of the patient's history were reviewed and updated as appropriate: Allergies, current medications, and problem list.    Review of Systems:   12 Pt  review of systems was negative except those addressed in the HPI,     History   Social History  . Marital Status: Married    Spouse Name: N/A    Number of Children: N/A  . Years of Education: N/A   Occupational History  . Not on file.   Social History Main Topics  . Smoking status: Never Smoker   . Smokeless tobacco: Never Used  . Alcohol Use: No  . Drug Use: No  . Sexually Active: Not on file   Other Topics Concern  . Not on file   Social History Narrative  . No narrative on file    Objective:  BP 140/76  Pulse 86  Temp(Src) 98.7 F (37.1 C) (Oral)  Resp 16  Wt 178 lb 4 oz (80.854 kg)  SpO2 95%  General appearance: alert,  cooperative and appears stated age Ears: normal TM's and external ear canals both ears Throat: lips, mucosa, and tongue normal; teeth and gums normal Neck: no adenopathy, no carotid bruit, supple, symmetrical, trachea midline and thyroid not enlarged, symmetric, no tenderness/mass/nodules Back: symmetric, no curvature. ROM normal. No CVA tenderness. Lungs: clear to auscultation bilaterally Heart: regular rate and rhythm, S1, S2 normal, no murmur, click, rub or gallop Abdomen: soft, non-tender; bowel sounds normal; no masses,  no organomegaly Pulses: 2+ and symmetric Skin: Skin color, texture, turgor normal. No rashes or lesions Lymph nodes: Cervical, supraclavicular, and axillary nodes normal.  Assessment and Plan:  CAD (coronary artery disease) She underwent PTCA on Nov 26 2011 for unstable angina and had a 75% stenosis of mid RCA resoloved with stent by [facility].  Continue asa, Plavix. .   Accelerated  hypertension Poorly controlled,  Renal arteries were assessed per Dr. Glennis Brink eval during cardiac cath Jan 2013 due to poorly controlled HTN despite use of 4 medications and were reportedly normal.  No changes today   Osteopenia due to cancer therapy Managed with alendronate.  She had a vertebral fracture of T12 last week withoout history of trauma.  MRI ordered given history of BRCA and chemotherapy.   Thoracic vertebral fracture In the setting of osteopenia from chemotherapy for treatment of  breast cancer. She had a vertebral fracture of T12 last week withoout history of trauma.  MRI ordered given history of BRCA and chemotherapy.   Vertebral compression fracture, initial encounter Her pain has nearly resolved and there is no indication for kyphoplasty.  I am concerned that this may be a pathologic fracture given her history of BRCA and currrent alendronate use.  MRI ordered.   Chronic neck pain Palin films were ordered to screen for disk disease given her complaints of pain and parasthesias of both hands.  Plain films were normal.     Updated Medication List Outpatient Encounter Prescriptions as of 04/12/2012  Medication Sig Dispense Refill  . alendronate (FOSAMAX) 70 MG tablet Take 1 tablet (70 mg total) by mouth every 7 (seven) days. Take with a full glass of water on an empty stomach.  12 tablet  3  . amLODipine (NORVASC) 5 MG tablet Take 1 tablet (5 mg total) by mouth daily.  30 tablet  11  . aspirin 81 MG tablet Take 160 mg by mouth daily.        . clopidogrel (PLAVIX) 75 MG tablet Take 75 mg by mouth daily.        Marland Kitchen glucose blood (ONE TOUCH ULTRA TEST) test strip Check blood sugar once a day  100 each  3  . isosorbide mononitrate (IMDUR) 30 MG 24 hr tablet Take 2 tablets (60 mg total) by mouth daily.  30 tablet  11  . losartan (COZAAR) 100 MG tablet Take 1 tablet (100 mg total) by mouth daily.  90 tablet  3  . metoprolol (TOPROL XL) 200 MG 24 hr tablet Take 1 tablet (200 mg  total) by mouth daily.  90 tablet  3  . niacin 500 MG tablet Take 1 tablet (500 mg total) by mouth daily.  90 tablet  3  . nitrofurantoin (MACRODANTIN) 100 MG capsule Take 100 mg by mouth daily.        Marland Kitchen omeprazole (PRILOSEC) 20 MG capsule Take 1 capsule (20 mg total) by mouth daily.  90 capsule  3  . ONETOUCH DELICA LANCETS MISC Check blood sugar once a day.  100 each  3  . pravastatin (PRAVACHOL) 20 MG tablet Take 20 mg by mouth daily.      Marland Kitchen spironolactone (ALDACTONE) 50 MG tablet Take 1 tablet (50 mg total) by mouth daily.  90 tablet  3  . diazepam (VALIUM) 5 MG tablet Take 1 tablet (5 mg total) by mouth every 12 (twelve) hours as needed for anxiety.  30 tablet  1  . traMADol (ULTRAM) 50 MG tablet Take 1 tablet (50 mg total) by mouth every 6 (six) hours as needed for pain.  60 tablet  1  . DISCONTD: budesonide-formoterol (SYMBICORT) 80-4.5 MCG/ACT inhaler Inhale into the lungs. 2 inhalations 2 x day       . DISCONTD: levalbuterol (XOPENEX HFA) 45 MCG/ACT inhaler Inhale 1-2 puffs into the lungs every 4 (four) hours as needed.        Marland Kitchen DISCONTD: montelukast (SINGULAIR) 10 MG tablet Take 10 mg by mouth daily.        Marland Kitchen DISCONTD: pravastatin (PRAVACHOL) 40 MG tablet Take 40 mg by mouth daily.           Orders Placed This Encounter  Procedures  . DG Cervical Spine 1 View    No Follow-up on file.

## 2012-04-14 ENCOUNTER — Ambulatory Visit: Payer: Self-pay | Admitting: Internal Medicine

## 2012-04-14 ENCOUNTER — Encounter: Payer: Self-pay | Admitting: Internal Medicine

## 2012-04-14 DIAGNOSIS — IMO0001 Reserved for inherently not codable concepts without codable children: Secondary | ICD-10-CM | POA: Insufficient documentation

## 2012-04-14 DIAGNOSIS — I10 Essential (primary) hypertension: Secondary | ICD-10-CM | POA: Insufficient documentation

## 2012-04-14 DIAGNOSIS — M542 Cervicalgia: Secondary | ICD-10-CM | POA: Insufficient documentation

## 2012-04-14 LAB — LIPID PANEL
Cholesterol: 161 mg/dL (ref 0–200)
HDL Cholesterol: 40 mg/dL (ref 40–60)
VLDL Cholesterol, Calc: 22 mg/dL (ref 5–40)

## 2012-04-14 LAB — COMPREHENSIVE METABOLIC PANEL
BUN: 13 mg/dL (ref 7–18)
Bilirubin,Total: 0.6 mg/dL (ref 0.2–1.0)
Chloride: 105 mmol/L (ref 98–107)
Creatinine: 0.55 mg/dL — ABNORMAL LOW (ref 0.60–1.30)
EGFR (African American): >60
EGFR (Non-African Amer.): >60
Glucose: 106 mg/dL — ABNORMAL HIGH (ref 65–99)
Osmolality: 278 (ref 275–301)
Potassium: 4 mmol/L (ref 3.5–5.1)
SGOT(AST): 22 U/L (ref 15–37)
Sodium: 139 mmol/L (ref 136–145)

## 2012-04-14 NOTE — Assessment & Plan Note (Addendum)
Managed with alendronate.  She had a vertebral fracture of T12 last week withoout history of trauma.  MRI ordered given history of BRCA and chemotherapy.

## 2012-04-14 NOTE — Assessment & Plan Note (Signed)
In the setting of osteopenia from chemotherapy for treatment of  breast cancer. She had a vertebral fracture of T12 last week withoout history of trauma.  MRI ordered given history of BRCA and chemotherapy.

## 2012-04-14 NOTE — Assessment & Plan Note (Signed)
Her pain has nearly resolved and there is no indication for kyphoplasty.  I am concerned that this may be a pathologic fracture given her history of BRCA and currrent alendronate use.  MRI ordered.

## 2012-04-14 NOTE — Assessment & Plan Note (Signed)
Palin films were ordered to screen for disk disease given her complaints of pain and parasthesias of both hands.  Plain films were normal.

## 2012-04-14 NOTE — Assessment & Plan Note (Addendum)
Poorly controlled,  Renal arteries were assessed per Dr. Glennis Brink eval during cardiac cath Jan 2013 due to poorly controlled HTN despite use of 4 medications and were reportedly normal.  No changes today

## 2012-04-14 NOTE — Assessment & Plan Note (Signed)
She underwent PTCA on Nov 26 2011 for unstable angina and had a 75% stenosis of mid RCA resoloved with stent by [facility].  Continue asa, Plavix. Marland Kitchen

## 2012-04-15 ENCOUNTER — Telehealth: Payer: Self-pay | Admitting: Internal Medicine

## 2012-04-15 MED ORDER — PRAVASTATIN SODIUM 40 MG PO TABS: 40.0000 mg | ORAL_TABLET | Freq: Every day | ORAL | Status: DC

## 2012-04-15 NOTE — Telephone Encounter (Signed)
Stacie Mcdonald cholesterol is elevated relative to goal and i have sent new rx for 40 mg pravastatin dose to [facility].  (Stacie Mcdonald LDL  was 99, goal is < 70) All other labs fine except vit d a little low.  Needs to take 2000 units of vit D3 daily for a month , then reduce to 1000 units daily   Neck x ray were normal.  No signs of cervical spine degenerative changes.

## 2012-04-18 NOTE — Telephone Encounter (Signed)
Left detailed message notifying patient.

## 2012-04-25 ENCOUNTER — Encounter: Payer: Self-pay | Admitting: Internal Medicine

## 2012-05-17 ENCOUNTER — Other Ambulatory Visit: Payer: Self-pay | Admitting: Internal Medicine

## 2012-05-17 MED ORDER — CLOPIDOGREL BISULFATE 75 MG PO TABS: 75.0000 mg | ORAL_TABLET | Freq: Every day | ORAL | Status: DC

## 2012-07-04 ENCOUNTER — Encounter: Payer: Self-pay | Admitting: Internal Medicine

## 2012-09-23 ENCOUNTER — Other Ambulatory Visit: Payer: Self-pay | Admitting: Internal Medicine

## 2012-09-23 DIAGNOSIS — R58 Hemorrhage, not elsewhere classified: Secondary | ICD-10-CM

## 2012-09-27 ENCOUNTER — Other Ambulatory Visit (INDEPENDENT_AMBULATORY_CARE_PROVIDER_SITE_OTHER): Payer: PRIVATE HEALTH INSURANCE

## 2012-09-27 DIAGNOSIS — R58 Hemorrhage, not elsewhere classified: Secondary | ICD-10-CM

## 2012-09-27 LAB — CBC WITH DIFFERENTIAL/PLATELET
Basophils Relative: 0.5 % (ref 0.0–3.0)
Eosinophils Relative: 1.3 % (ref 0.0–5.0)
HCT: 40.3 % (ref 36.0–46.0)
MCV: 89.6 fl (ref 78.0–100.0)
Monocytes Absolute: 0.5 10*3/uL (ref 0.1–1.0)
Monocytes Relative: 6.9 % (ref 3.0–12.0)
Neutrophils Relative %: 53.1 % (ref 43.0–77.0)
RBC: 4.5 Mil/uL (ref 3.87–5.11)
WBC: 7.2 10*3/uL (ref 4.5–10.5)

## 2012-09-27 LAB — PROTIME-INR: Prothrombin Time: 10.3 s (ref 10.2–12.4)

## 2012-10-24 ENCOUNTER — Other Ambulatory Visit: Payer: Self-pay

## 2012-10-24 MED ORDER — AMLODIPINE BESYLATE 5 MG PO TABS: 5.0000 mg | ORAL_TABLET | Freq: Every day | ORAL | Status: DC

## 2012-10-24 NOTE — Telephone Encounter (Signed)
Refill request for Amlodipine 5 mg sent electronic to [facility] advised patient needs OV for further refills.

## 2012-11-07 ENCOUNTER — Other Ambulatory Visit: Payer: Self-pay | Admitting: Internal Medicine

## 2012-11-07 DIAGNOSIS — I208 Other forms of angina pectoris: Secondary | ICD-10-CM

## 2012-11-07 MED ORDER — ISOSORBIDE MONONITRATE ER 30 MG PO TB24: 60.0000 mg | ORAL_TABLET | Freq: Every day | ORAL | Status: DC

## 2012-11-07 NOTE — Telephone Encounter (Signed)
Isosorbide MN ER 60 mg TA  Take one tablet by mouth daily  # 90

## 2012-11-07 NOTE — Telephone Encounter (Signed)
Med filled.  

## 2012-12-05 ENCOUNTER — Ambulatory Visit (INDEPENDENT_AMBULATORY_CARE_PROVIDER_SITE_OTHER): Payer: 59 | Admitting: Internal Medicine

## 2012-12-05 ENCOUNTER — Encounter: Payer: Self-pay | Admitting: Internal Medicine

## 2012-12-05 VITALS — BP 128/64 | HR 80 | Temp 98.6°F | Resp 16 | Ht 64.0 in | Wt 181.2 lb

## 2012-12-05 DIAGNOSIS — Z1331 Encounter for screening for depression: Secondary | ICD-10-CM

## 2012-12-05 DIAGNOSIS — Z1239 Encounter for other screening for malignant neoplasm of breast: Secondary | ICD-10-CM

## 2012-12-05 DIAGNOSIS — M899 Disorder of bone, unspecified: Secondary | ICD-10-CM

## 2012-12-05 DIAGNOSIS — S22009A Unspecified fracture of unspecified thoracic vertebra, initial encounter for closed fracture: Secondary | ICD-10-CM

## 2012-12-05 DIAGNOSIS — C801 Malignant (primary) neoplasm, unspecified: Secondary | ICD-10-CM

## 2012-12-05 DIAGNOSIS — Z8781 Personal history of (healed) traumatic fracture: Secondary | ICD-10-CM

## 2012-12-05 DIAGNOSIS — M8580 Other specified disorders of bone density and structure, unspecified site: Secondary | ICD-10-CM

## 2012-12-05 DIAGNOSIS — Z0000 Encounter for general adult medical examination without abnormal findings: Secondary | ICD-10-CM | POA: Insufficient documentation

## 2012-12-05 NOTE — Assessment & Plan Note (Addendum)
While taking alendronate, DEXA scan needed.

## 2012-12-05 NOTE — Assessment & Plan Note (Signed)
She is 12 years out, no recurrence  Breast exam done today and mammogram ordered.

## 2012-12-05 NOTE — Progress Notes (Signed)
Patient ID: Stacie Mcdonald, female   DOB: [birth year], 67 y.o.   MRN: [id number]  Subjective:     Stacie Mcdonald is a 67 y.o. female and is here for a comprehensive physical exam. The patient reports no problems.  History   Social History  . Marital Status: Married    Spouse Name: N/A    Number of Children: N/A  . Years of Education: N/A   Occupational History  . Not on file.   Social History Main Topics  . Smoking status: Never Smoker   . Smokeless tobacco: Never Used  . Alcohol Use: No  . Drug Use: No  . Sexually Active: Not on file   Other Topics Concern  . Not on file   Social History Narrative  . No narrative on file   Health Maintenance  Topic Date Due  . Tetanus/tdap  01/05/1964  . Colonoscopy  01/04/1995  . Zostavax  01/04/2005  . Pneumococcal Polysaccharide Vaccine Age 38 And Over  01/04/2010  . Influenza Vaccine  07/17/2012  . Mammogram  01/10/2014    The following portions of the patient's history were reviewed and updated as appropriate: allergies, current medications, past family history, past medical history, past social history, past surgical history and problem list.  Review of Systems A comprehensive review of systems was negative.   Objective:     BP 128/64  Pulse 80  Temp 98.6 F (37 C) (Oral)  Resp 16  Ht 5\' 4"  (1.626 m)  Wt 181 lb 4 oz (82.214 kg)  BMI 31.11 kg/m2  SpO2 93%  General Appearance:    Alert, cooperative, no distress, appears stated age  Head:    Normocephalic, without obvious abnormality, atraumatic  Eyes:    PERRL, conjunctiva/corneas clear, EOM's intact, fundi    benign, both eyes  Ears:    Normal TM's and external ear canals, both ears  Nose:   Nares normal, septum midline, mucosa normal, no drainage    or sinus tenderness  Throat:   Lips, mucosa, and tongue normal; teeth and gums normal  Neck:   Supple, symmetrical, trachea midline, no adenopathy;    thyroid:  no enlargement/tenderness/nodules; no carotid   bruit or JVD    Back:     Symmetric, no curvature, ROM normal, no CVA tenderness  Lungs:     Clear to auscultation bilaterally, respirations unlabored  Chest Wall:    No tenderness or deformity   Heart:    Regular rate and rhythm, S1 and S2 normal, no murmur, rub   or gallop  Breast Exam:    No tenderness, masses, or nipple abnormality  Abdomen:     Soft, non-tender, bowel sounds active all four quadrants,    no masses, no organomegaly  Genitalia:    Normal female without lesion, discharge or tenderness  Rectal:    Normal tone, normal prostate, no masses or tenderness;   guaiac negative stool  Extremities:   Extremities normal, atraumatic, no cyanosis or edema  Pulses:   2+ and symmetric all extremities  Skin:   Skin color, texture, turgor normal, no rashes or lesions  Lymph nodes:   Cervical, supraclavicular, and axillary nodes normal  Neurologic:   CNII-XII intact, normal strength, sensation and reflexes    throughout     Assessment:    Healthy female exam. Plan:   Breast Cancer She is 12 years out, no recurrence  Breast exam done today and mammogram ordered.   Vertebral compression fracture, initial encounter Pelvic and PAP  were done today.   Thoracic vertebral fracture While taking alendronate, DEXA scan needed.   Osteopenia due to cancer therapy previously on EVista in 2008-09, followed by alendronate therapy in 210   Updated Medication List Outpatient Encounter Prescriptions as of 12/05/2012  Medication Sig Dispense Refill  . alendronate (FOSAMAX) 70 MG tablet Take 1 tablet (70 mg total) by mouth every 7 (seven) days. Take with a full glass of water on an empty stomach.  12 tablet  3  . amLODipine (NORVASC) 5 MG tablet Take 1 tablet (5 mg total) by mouth daily.  30 tablet  2  . aspirin 81 MG tablet Take 160 mg by mouth daily.        . clopidogrel (PLAVIX) 75 MG tablet Take 1 tablet (75 mg total) by mouth daily.  90 tablet  3  . isosorbide mononitrate (IMDUR) 30 MG 24 hr tablet Take  2 tablets (60 mg total) by mouth daily.  30 tablet  11  . losartan (COZAAR) 100 MG tablet Take 1 tablet (100 mg total) by mouth daily.  90 tablet  3  . metoprolol (TOPROL XL) 200 MG 24 hr tablet Take 1 tablet (200 mg total) by mouth daily.  90 tablet  3  . niacin 500 MG tablet Take 1 tablet (500 mg total) by mouth daily.  90 tablet  3  . nitrofurantoin (MACRODANTIN) 100 MG capsule Take 100 mg by mouth daily.        Marland Kitchen omeprazole (PRILOSEC) 20 MG capsule Take 1 capsule (20 mg total) by mouth daily.  90 capsule  3  . ONETOUCH DELICA LANCETS MISC Check blood sugar once a day.  100 each  3  . solifenacin (VESICARE) 10 MG tablet Take 10 mg by mouth daily.      Marland Kitchen spironolactone (ALDACTONE) 50 MG tablet Take 1 tablet (50 mg total) by mouth daily.  90 tablet  3  . [DISCONTINUED] pravastatin (PRAVACHOL) 40 MG tablet Take 1 tablet (40 mg total) by mouth daily.  90 tablet  3

## 2012-12-05 NOTE — Patient Instructions (Addendum)
i will track down when we started fosamax, and if it has been 5 years we will take a holdiay from it  Bone Density and mammogram TBC.    To raise your HDL , think more mediterranean style diet (add a glass of wine with or before dinner)  This is  my version of a  "Low GI"  Diet:  It is not ultra low carb, but will still lower your blood sugars and allow you to lose 5 to 10 lbs per month if you follow it carefully. All of the foods can be found at grocery stores and in bulk at [employer].  The Atkins protein bars and shakes are available in more varieties at [employer], [employer] and [employer].     7 AM Breakfast:  Low carbohydrate Protein  Shakes (I recommend the EAS AdvantEdge "Carb Control" shakes  Or the low carb shakes by Atkins.   Both are available everywhere:  In  cases at [employer]Js  Or in 4 packs at grocery stores and pharmacies  2.5 carbs  (Alternative is  a toasted Arnold's Sandwhich Thin w/ peanut butter, a "Bagel Thin" with cream cheese and salmon) or  a scrambled egg burrito made with a low carb tortilla .  Avoid cereal and bananas, oatmeal too unless you are cooking the old fashioned kind that takes 30-40 minutes to prepare.  the rest is overly processed, has minimal fiber, and is loaded with carbohydrates!   10 AM: Protein bar by Atkins (the snack size, under 200 cal).  There are many varieties , available widely again or in bulk in limited varieties at BJs)  Other so called "protein bars" tend to be loaded with carbohydrates.  Remember, in food advertising, the word "energy" is synonymous for " carbohydrate."  Lunch: sandwich of [place], (or any lunchmeat, grilled meat or canned tuna), fresh avocado, mayonnaise  and cheese on a lower carbohydrate pita bread, flatbread, or tortilla . Ok to use regular mayonnaise. The bread is the only source or carbohydrate that can be decreased (Joseph's makes a pita bread and a flat bread that are 50 cal and 4 net carbs ; Toufayan makes a low carb flatbread  that's 100 cal and 9 net carbs  and  Mission makes a low carb whole wheat tortilla  That is 210 cal and 6 net carbs)  3 PM:  Mid day :  Another protein bar,  Or a  cheese stick (100 cal, 0 carbs),  Or 1 ounce of  almonds, walnuts, pistachios, pecans, peanuts,  Macadamia nuts. Or a Dannon light n Fit greek yogurt, 80 cal 8 net carbs . Avoid "granola"; the dried cranberries and raisins are loaded with carbohydrates. Mixed nuts ok if no raisins or cranberries or dried fruit.      6 PM  Dinner:  "mean and green:"  Meat/chicken/fish or a high protein legume; , with a green salad, and a low GI  Veggie (broccoli, cauliflower, green beans, spinach, brussel sprouts. Lima beans) : Avoid "Low fat dressings, as well as Reyne Dumas and [address]! They are loaded with sugar! Instead use ranch, vinagrette,  Blue cheese, etc.  There is a low carb pasta by Dreamfield's available at [employer] [facility] that is acceptable and tastes great. Try Michel Angel's chicken piccata over low carb pasta. The chicken dish is 0 carbs, and can be found in frozen section at BJs and [employer]. Also try [employer] "Carnitas" (pulled pork, no sauce,  0 carbs) and his pot  roast.   both are in the refrigerated section at BJs   Dreamfield's makes a low carb pasta only 5 g/serving.  Available at all grocery stores,  And tastes like normal pasta  9 PM snack : Breyer's "low carb" fudgsicle or  ice cream bar (Carb Smart line), or  Weight Watcher's ice cream bar , or another "no sugar added" ice cream;a serving of fresh berries/cherries with whipped cream (Avoid bananas, pineapple, grapes  and watermelon on a regular basis because they are high in sugar)   Remember that snack Substitutions should be less than 10 carbs per serving and meals < 20 carbs. Remember to subtract fiber grams and sugar alcohols to get the "net carbs."

## 2012-12-05 NOTE — Assessment & Plan Note (Signed)
Pelvic and PAP were done today.

## 2012-12-05 NOTE — Assessment & Plan Note (Signed)
previously on EVista in 2008-09, followed by alendronate therapy in 210

## 2013-01-12 ENCOUNTER — Telehealth: Payer: Self-pay | Admitting: Internal Medicine

## 2013-01-12 NOTE — Telephone Encounter (Signed)
My old records from prior practice indicate that she started takign alendronate dec 2011 wo we can continue it until end of 2016.  hgba1c was checked and was the upper end of normal.  This is highly predictive of future development of diabetes.  Low carb diet and exercise will help prevent it.

## 2013-01-13 NOTE — Telephone Encounter (Signed)
Pt states she has a bone density in two weeks will hold off on the Alendronate until the results of that come back.

## 2013-01-24 ENCOUNTER — Other Ambulatory Visit: Payer: Self-pay | Admitting: *Deleted

## 2013-01-24 ENCOUNTER — Ambulatory Visit: Payer: Self-pay | Admitting: Internal Medicine

## 2013-01-24 MED ORDER — SPIRONOLACTONE 50 MG PO TABS: 50.0000 mg | ORAL_TABLET | Freq: Every day | ORAL | Status: DC

## 2013-01-24 NOTE — Telephone Encounter (Signed)
Med filled.  

## 2013-01-27 ENCOUNTER — Encounter: Payer: Self-pay | Admitting: Internal Medicine

## 2013-01-27 ENCOUNTER — Telehealth: Payer: Self-pay | Admitting: Internal Medicine

## 2013-01-27 NOTE — Telephone Encounter (Signed)
T scores on recent DEXA scan have improved slightly,  Continue weekly alendronate for osteoporosis

## 2013-01-27 NOTE — Telephone Encounter (Signed)
Pt notified of results

## 2013-02-10 ENCOUNTER — Encounter: Payer: Self-pay | Admitting: Internal Medicine

## 2013-02-27 ENCOUNTER — Other Ambulatory Visit: Payer: Self-pay | Admitting: *Deleted

## 2013-02-27 DIAGNOSIS — I10 Essential (primary) hypertension: Secondary | ICD-10-CM | POA: Diagnosis not present

## 2013-02-27 DIAGNOSIS — R0609 Other forms of dyspnea: Secondary | ICD-10-CM | POA: Diagnosis not present

## 2013-02-27 DIAGNOSIS — I2510 Atherosclerotic heart disease of native coronary artery without angina pectoris: Secondary | ICD-10-CM | POA: Diagnosis not present

## 2013-02-27 DIAGNOSIS — I209 Angina pectoris, unspecified: Secondary | ICD-10-CM | POA: Diagnosis not present

## 2013-02-27 MED ORDER — NIACIN 500 MG PO TABS: 500.0000 mg | ORAL_TABLET | Freq: Every day | ORAL | Status: DC

## 2013-02-27 MED ORDER — METOPROLOL SUCCINATE ER 200 MG PO TB24: 200.0000 mg | ORAL_TABLET | Freq: Every day | ORAL | Status: DC

## 2013-02-27 MED ORDER — LOSARTAN POTASSIUM 100 MG PO TABS: 100.0000 mg | ORAL_TABLET | Freq: Every day | ORAL | Status: DC

## 2013-02-27 NOTE — Telephone Encounter (Signed)
Med filled.  

## 2013-02-27 NOTE — Telephone Encounter (Signed)
Refill request  Omeprazole 20 MG  #90  Take one capsule (20 mg total) by mouth daily

## 2013-03-06 ENCOUNTER — Other Ambulatory Visit: Payer: Self-pay | Admitting: *Deleted

## 2013-03-06 ENCOUNTER — Encounter: Payer: Self-pay | Admitting: *Deleted

## 2013-03-06 MED ORDER — OMEPRAZOLE 20 MG PO CPDR: 20.0000 mg | DELAYED_RELEASE_CAPSULE | Freq: Every day | ORAL | Status: DC

## 2013-03-10 ENCOUNTER — Telehealth: Payer: Self-pay | Admitting: *Deleted

## 2013-03-10 ENCOUNTER — Telehealth: Payer: Self-pay | Admitting: Internal Medicine

## 2013-03-10 NOTE — Telephone Encounter (Signed)
Notified patient sent to pharmacy

## 2013-03-10 NOTE — Telephone Encounter (Signed)
I cannot reorder one touch delica test strips through [facility] anymore.  Please call her pharmacy bc I do not know if the substitution you picked will fit her meter.r

## 2013-03-10 NOTE — Telephone Encounter (Signed)
Refill Request  One Touch Ultra Test strip  #100  Test daily

## 2013-03-10 NOTE — Telephone Encounter (Signed)
Called into pharmacy as patient stated she needed for monitoring CBG.

## 2013-03-10 NOTE — Telephone Encounter (Signed)
Refill Request  One Touch Delica Lancets   #100  Check blood sugar once daily

## 2013-03-10 NOTE — Telephone Encounter (Signed)
See prior unrouted response from me.

## 2013-03-10 NOTE — Telephone Encounter (Signed)
Please advise if correct test strips not in meds and orders for patient.

## 2013-03-10 NOTE — Telephone Encounter (Signed)
Caller: Tawni/Patient; Phone: [phone]; Reason for Call: Patient returning call from [facility] regarding using One Touch.  States she is using a One Touch meter.  Info to office for staff review/callback.  May reach patient at [phone].  Krs/can

## 2013-03-28 DIAGNOSIS — R339 Retention of urine, unspecified: Secondary | ICD-10-CM | POA: Diagnosis not present

## 2013-03-28 DIAGNOSIS — N3020 Other chronic cystitis without hematuria: Secondary | ICD-10-CM | POA: Diagnosis not present

## 2013-05-11 DIAGNOSIS — R350 Frequency of micturition: Secondary | ICD-10-CM | POA: Diagnosis not present

## 2013-05-11 DIAGNOSIS — R3989 Other symptoms and signs involving the genitourinary system: Secondary | ICD-10-CM | POA: Diagnosis not present

## 2013-05-11 DIAGNOSIS — N3020 Other chronic cystitis without hematuria: Secondary | ICD-10-CM | POA: Diagnosis not present

## 2013-05-11 DIAGNOSIS — R339 Retention of urine, unspecified: Secondary | ICD-10-CM | POA: Diagnosis not present

## 2013-05-22 ENCOUNTER — Ambulatory Visit: Payer: Self-pay | Admitting: Internal Medicine

## 2013-05-22 DIAGNOSIS — Z7902 Long term (current) use of antithrombotics/antiplatelets: Secondary | ICD-10-CM | POA: Diagnosis not present

## 2013-05-22 DIAGNOSIS — J449 Chronic obstructive pulmonary disease, unspecified: Secondary | ICD-10-CM | POA: Diagnosis not present

## 2013-05-22 DIAGNOSIS — E785 Hyperlipidemia, unspecified: Secondary | ICD-10-CM | POA: Diagnosis not present

## 2013-05-22 DIAGNOSIS — I2510 Atherosclerotic heart disease of native coronary artery without angina pectoris: Secondary | ICD-10-CM | POA: Diagnosis not present

## 2013-05-22 DIAGNOSIS — Z9221 Personal history of antineoplastic chemotherapy: Secondary | ICD-10-CM | POA: Diagnosis not present

## 2013-05-22 DIAGNOSIS — I10 Essential (primary) hypertension: Secondary | ICD-10-CM | POA: Diagnosis not present

## 2013-05-22 DIAGNOSIS — G4733 Obstructive sleep apnea (adult) (pediatric): Secondary | ICD-10-CM | POA: Diagnosis not present

## 2013-05-22 DIAGNOSIS — M810 Age-related osteoporosis without current pathological fracture: Secondary | ICD-10-CM | POA: Diagnosis not present

## 2013-05-22 DIAGNOSIS — I200 Unstable angina: Secondary | ICD-10-CM | POA: Diagnosis not present

## 2013-05-22 DIAGNOSIS — Z923 Personal history of irradiation: Secondary | ICD-10-CM | POA: Diagnosis not present

## 2013-05-22 DIAGNOSIS — Z853 Personal history of malignant neoplasm of breast: Secondary | ICD-10-CM | POA: Diagnosis not present

## 2013-05-22 DIAGNOSIS — Z8249 Family history of ischemic heart disease and other diseases of the circulatory system: Secondary | ICD-10-CM | POA: Diagnosis not present

## 2013-05-22 DIAGNOSIS — E669 Obesity, unspecified: Secondary | ICD-10-CM | POA: Diagnosis not present

## 2013-05-22 DIAGNOSIS — K219 Gastro-esophageal reflux disease without esophagitis: Secondary | ICD-10-CM | POA: Diagnosis not present

## 2013-05-22 DIAGNOSIS — Z9861 Coronary angioplasty status: Secondary | ICD-10-CM | POA: Diagnosis not present

## 2013-05-22 DIAGNOSIS — Z9010 Acquired absence of unspecified breast and nipple: Secondary | ICD-10-CM | POA: Diagnosis not present

## 2013-05-22 DIAGNOSIS — Z7982 Long term (current) use of aspirin: Secondary | ICD-10-CM | POA: Diagnosis not present

## 2013-05-22 LAB — COMPREHENSIVE METABOLIC PANEL
Albumin: 4.1 g/dL (ref 3.4–5.0)
BUN: 15 mg/dL (ref 7–18)
Bilirubin,Total: 0.3 mg/dL (ref 0.2–1.0)
Calcium, Total: 9 mg/dL (ref 8.5–10.1)
Chloride: 106 mmol/L (ref 98–107)
Co2: 27 mmol/L (ref 21–32)
Glucose: 115 mg/dL — ABNORMAL HIGH (ref 65–99)
Osmolality: 279 (ref 275–301)
Potassium: 4.4 mmol/L (ref 3.5–5.1)
SGOT(AST): 24 U/L (ref 15–37)
Sodium: 139 mmol/L (ref 136–145)
Total Protein: 7.4 g/dL (ref 6.4–8.2)

## 2013-05-22 LAB — CBC WITH DIFFERENTIAL/PLATELET
Basophil #: 0.1 10*3/uL (ref 0.0–0.1)
Eosinophil #: 0.1 10*3/uL (ref 0.0–0.7)
HGB: 13.5 g/dL (ref 12.0–16.0)
Lymphocyte #: 2.3 10*3/uL (ref 1.0–3.6)
Lymphocyte %: 32.4 %
MCHC: 34.1 g/dL (ref 32.0–36.0)
Monocyte %: 7.2 %
Neutrophil #: 4.1 10*3/uL (ref 1.4–6.5)
Platelet: 242 10*3/uL (ref 150–440)
RBC: 4.49 10*6/uL (ref 3.80–5.20)
RDW: 14.2 % (ref 11.5–14.5)

## 2013-05-22 LAB — CK TOTAL AND CKMB (NOT AT ARMC): CK, Total: 111 U/L (ref 21–215)

## 2013-05-23 DIAGNOSIS — E785 Hyperlipidemia, unspecified: Secondary | ICD-10-CM | POA: Diagnosis not present

## 2013-05-23 DIAGNOSIS — Z853 Personal history of malignant neoplasm of breast: Secondary | ICD-10-CM | POA: Diagnosis not present

## 2013-05-23 DIAGNOSIS — I2510 Atherosclerotic heart disease of native coronary artery without angina pectoris: Secondary | ICD-10-CM | POA: Diagnosis not present

## 2013-05-23 DIAGNOSIS — K219 Gastro-esophageal reflux disease without esophagitis: Secondary | ICD-10-CM | POA: Diagnosis not present

## 2013-05-23 DIAGNOSIS — E669 Obesity, unspecified: Secondary | ICD-10-CM | POA: Diagnosis not present

## 2013-05-23 DIAGNOSIS — I200 Unstable angina: Secondary | ICD-10-CM | POA: Diagnosis not present

## 2013-05-23 LAB — BASIC METABOLIC PANEL
Anion Gap: 8 (ref 7–16)
BUN: 15 mg/dL (ref 7–18)
Calcium, Total: 8.9 mg/dL (ref 8.5–10.1)
Chloride: 104 mmol/L (ref 98–107)
Co2: 25 mmol/L (ref 21–32)
EGFR (African American): >60
Glucose: 107 mg/dL — ABNORMAL HIGH (ref 65–99)
Potassium: 4.4 mmol/L (ref 3.5–5.1)
Sodium: 137 mmol/L (ref 136–145)

## 2013-05-30 ENCOUNTER — Other Ambulatory Visit: Payer: Self-pay | Admitting: *Deleted

## 2013-05-30 MED ORDER — CLOPIDOGREL BISULFATE 75 MG PO TABS: 75.0000 mg | ORAL_TABLET | Freq: Every day | ORAL | Status: DC

## 2013-06-01 DIAGNOSIS — I209 Angina pectoris, unspecified: Secondary | ICD-10-CM | POA: Diagnosis not present

## 2013-06-01 DIAGNOSIS — I2510 Atherosclerotic heart disease of native coronary artery without angina pectoris: Secondary | ICD-10-CM | POA: Diagnosis not present

## 2013-06-02 ENCOUNTER — Other Ambulatory Visit: Payer: Self-pay | Admitting: *Deleted

## 2013-06-02 MED ORDER — ALENDRONATE SODIUM 70 MG PO TABS: 70.0000 mg | ORAL_TABLET | ORAL | Status: DC

## 2013-06-05 DIAGNOSIS — N390 Urinary tract infection, site not specified: Secondary | ICD-10-CM | POA: Diagnosis not present

## 2013-06-05 DIAGNOSIS — N3020 Other chronic cystitis without hematuria: Secondary | ICD-10-CM | POA: Diagnosis not present

## 2013-06-15 DIAGNOSIS — I2510 Atherosclerotic heart disease of native coronary artery without angina pectoris: Secondary | ICD-10-CM | POA: Diagnosis not present

## 2013-06-15 DIAGNOSIS — I200 Unstable angina: Secondary | ICD-10-CM | POA: Diagnosis not present

## 2013-06-15 DIAGNOSIS — I10 Essential (primary) hypertension: Secondary | ICD-10-CM | POA: Diagnosis not present

## 2013-06-21 ENCOUNTER — Ambulatory Visit: Payer: Self-pay | Admitting: Internal Medicine

## 2013-06-21 DIAGNOSIS — I200 Unstable angina: Secondary | ICD-10-CM | POA: Diagnosis not present

## 2013-06-21 DIAGNOSIS — I2510 Atherosclerotic heart disease of native coronary artery without angina pectoris: Secondary | ICD-10-CM | POA: Diagnosis not present

## 2013-06-21 LAB — BASIC METABOLIC PANEL
Anion Gap: 5 — ABNORMAL LOW (ref 7–16)
BUN: 17 mg/dL (ref 7–18)
Co2: 27 mmol/L (ref 21–32)
Creatinine: 0.83 mg/dL (ref 0.60–1.30)
EGFR (Non-African Amer.): >60
Glucose: 103 mg/dL — ABNORMAL HIGH (ref 65–99)
Osmolality: 281 (ref 275–301)
Potassium: 4.2 mmol/L (ref 3.5–5.1)

## 2013-06-22 LAB — BASIC METABOLIC PANEL
BUN: 10 mg/dL (ref 7–18)
Co2: 25 mmol/L (ref 21–32)
Creatinine: 0.56 mg/dL — ABNORMAL LOW (ref 0.60–1.30)
EGFR (Non-African Amer.): >60
Sodium: 140 mmol/L (ref 136–145)

## 2013-06-22 LAB — CK TOTAL AND CKMB (NOT AT ARMC)
CK, Total: 91 U/L (ref 21–215)
CK-MB: 1.4 ng/mL (ref 0.5–3.6)

## 2013-07-06 DIAGNOSIS — I209 Angina pectoris, unspecified: Secondary | ICD-10-CM | POA: Diagnosis not present

## 2013-07-06 DIAGNOSIS — I2510 Atherosclerotic heart disease of native coronary artery without angina pectoris: Secondary | ICD-10-CM | POA: Diagnosis not present

## 2013-08-30 ENCOUNTER — Other Ambulatory Visit: Payer: Self-pay | Admitting: *Deleted

## 2013-08-30 MED ORDER — OMEPRAZOLE 20 MG PO CPDR: 20.0000 mg | DELAYED_RELEASE_CAPSULE | Freq: Every day | ORAL | Status: DC

## 2013-09-18 ENCOUNTER — Other Ambulatory Visit: Payer: Self-pay | Admitting: *Deleted

## 2013-09-18 MED ORDER — CLOPIDOGREL BISULFATE 75 MG PO TABS: 75.0000 mg | ORAL_TABLET | Freq: Every day | ORAL | Status: DC

## 2013-09-29 DIAGNOSIS — L570 Actinic keratosis: Secondary | ICD-10-CM | POA: Diagnosis not present

## 2013-09-29 DIAGNOSIS — L821 Other seborrheic keratosis: Secondary | ICD-10-CM | POA: Diagnosis not present

## 2013-10-25 ENCOUNTER — Other Ambulatory Visit: Payer: Self-pay | Admitting: *Deleted

## 2013-10-25 MED ORDER — NIACIN 500 MG PO TABS: 500.0000 mg | ORAL_TABLET | Freq: Every day | ORAL | Status: DC

## 2013-11-27 ENCOUNTER — Other Ambulatory Visit: Payer: Self-pay | Admitting: Internal Medicine

## 2013-11-27 NOTE — Telephone Encounter (Signed)
Appt 12/05/13.

## 2013-12-04 ENCOUNTER — Encounter (INDEPENDENT_AMBULATORY_CARE_PROVIDER_SITE_OTHER): Payer: Self-pay

## 2013-12-04 ENCOUNTER — Encounter: Payer: Self-pay | Admitting: Internal Medicine

## 2013-12-04 ENCOUNTER — Ambulatory Visit (INDEPENDENT_AMBULATORY_CARE_PROVIDER_SITE_OTHER): Payer: 59 | Admitting: Internal Medicine

## 2013-12-04 VITALS — BP 144/82 | HR 92 | Temp 98.8°F | Resp 16 | Ht 63.0 in | Wt 181.0 lb

## 2013-12-04 DIAGNOSIS — I10 Essential (primary) hypertension: Secondary | ICD-10-CM

## 2013-12-04 DIAGNOSIS — Z0000 Encounter for general adult medical examination without abnormal findings: Secondary | ICD-10-CM

## 2013-12-04 DIAGNOSIS — K635 Polyp of colon: Secondary | ICD-10-CM

## 2013-12-04 DIAGNOSIS — E785 Hyperlipidemia, unspecified: Secondary | ICD-10-CM

## 2013-12-04 DIAGNOSIS — S22009A Unspecified fracture of unspecified thoracic vertebra, initial encounter for closed fracture: Secondary | ICD-10-CM

## 2013-12-04 DIAGNOSIS — R6889 Other general symptoms and signs: Secondary | ICD-10-CM

## 2013-12-04 DIAGNOSIS — I2510 Atherosclerotic heart disease of native coronary artery without angina pectoris: Secondary | ICD-10-CM

## 2013-12-04 DIAGNOSIS — D126 Benign neoplasm of colon, unspecified: Secondary | ICD-10-CM

## 2013-12-04 DIAGNOSIS — Z0001 Encounter for general adult medical examination with abnormal findings: Secondary | ICD-10-CM

## 2013-12-04 NOTE — Progress Notes (Signed)
Patient ID: Stacie Mcdonald Mcdonald, female   DOB: 10-Feb-1946, 68 y.o.   MRN: [id number] The patient is here for annual Medicare wellness examination and management of other chronic and acute problems.  She had cardiac catheterization  In August 2014 by Dr Clayborn Bigness after presenting with  unstable angina with 2 stents placed.  Still on plavix for DES placements in the proximal part of the RCA (3 total in the RCA) and one in the distal   Still having atypical chest pain with emotion,  Still having dyspnea.  Has 5 grandchildren at home because one if hospitalized in the ICU at [facility]  Has not tried ntg bc it feels different, and does nt last.  Has appt with [facility]. In Feb.  Takes a PPI daily   Some anxiety over daughter's difficulties.    Pre diabetes   aic 5.9  .  Checks sugars post prandial s are < 120     The risk factors are reflected in the social history.  The roster of all physicians providing medical care to patient - is listed in the Snapshot section of the chart.  Activities of daily living:  The patient is 100% independent in all ADLs: dressing, toileting, feeding as well as independent mobility  Home safety : The patient has smoke detectors in the home. They wear seatbelts.  There are no firearms at home. There is no violence in the home.   There is no risks for hepatitis, STDs or HIV. There is no   history of blood transfusion. They have no travel history to infectious disease endemic areas of the world.  The patient has seen their dentist in the last six month. They have seen their eye doctor in the last year. They admit to slight hearing difficulty with regard to whispered voices and some television programs.  They have deferred audiologic testing in the last year.  They do not  have excessive sun exposure. Discussed the need for sun protection: hats, long sleeves and use of sunscreen if there is significant sun exposure.   Diet: the importance of a healthy diet is discussed. They do have a  healthy diet.  The benefits of regular aerobic exercise were discussed. She walks 4 times per week ,  20 minutes.   Depression screen: there are no signs or vegative symptoms of depression- irritability, change in appetite, anhedonia, sadness/tearfullness.  Cognitive assessment: the patient manages all their financial and personal affairs and is actively engaged. They could relate day,date,year and events; recalled 2/3 objects at 3 minutes; performed clock-face test normally.  The following portions of the patient's history were reviewed and updated as appropriate: allergies, current medications, past family history, past medical history,  past surgical history, past social history  and problem list.  Visual acuity was not assessed per patient preference since she has regular follow up with her ophthalmologist. Hearing and body mass index were assessed and reviewed.   During the course of the visit the patient was educated and counseled about appropriate screening and preventive services including : fall prevention , diabetes screening, nutrition counseling, colorectal cancer screening, and recommended immunizations.    Objective:  BP 144/82  Pulse 92  Temp(Src) 98.8 F (37.1 C) (Oral)  Resp 16  Ht 5\' 3"  (1.6 m)  Wt 181 lb (82.101 kg)  BMI 32.07 kg/m2  SpO2 94%  General Appearance:    Alert, cooperative, no distress, appears stated age  Head:    Normocephalic, without obvious abnormality, atraumatic  Eyes:  PERRL, conjunctiva/corneas clear, EOM's intact, fundi    benign, both eyes  Ears:    Normal TM's and external ear canals, both ears  Nose:   Nares normal, septum midline, mucosa normal, no drainage    or sinus tenderness  Throat:   Lips, mucosa, and tongue normal; teeth and gums normal  Neck:   Supple, symmetrical, trachea midline, no adenopathy;    thyroid:  no enlargement/tenderness/nodules; no carotid   bruit or JVD  Back:     Symmetric, no curvature, ROM normal, no CVA  tenderness  Lungs:     Clear to auscultation bilaterally, respirations unlabored  Chest Wall:    No tenderness or deformity   Heart:    Regular rate and rhythm, S1 and S2 normal, no murmur, rub   or gallop  Breast Exam:    Right mastectomy with reconstruction noted, left breast without masses, or nipple abnormality  Abdomen:     Soft, non-tender, bowel sounds active all four quadrants,    no masses, no organomegaly  Extremities:   Extremities normal, atraumatic, no cyanosis or edema  Pulses:   2+ and symmetric all extremities  Skin:   Skin color, texture, turgor normal, no rashes or lesions  Lymph nodes:   Cervical, supraclavicular, and axillary nodes normal  Neurologic:   CNII-XII intact, normal strength, sensation and reflexes    throughout    Assessment and Plan:   Colon polyps Due for colonoscopy due to history of polyps ,  Her last colonoscopy was normal in  2010 by Loistine Simas  CAD (coronary artery disease) She continues to have mild substernal chest pain which occurs at rest and with emotional stress. She underwent  recent cardiac catheterization and stent placement. x2 in the RCA  She's taking Ranexa.  has not had post procedure stress test or echocardiogram. She has not tried sublingual nitroglycerin for her pain. He follows up with Dr. Clayborn Bigness next month.  Hyperlipidemia LDL goal < 70 Is not taking a statin despite known coronary artery disease. She will return for fasting lipids. I will recommend statin therapy regardless of her cholesterol levels once I see her liver and kidney functions.  Accelerated hypertension Well controlled on current regimen. Renal function due for assessment , no changes today.  Routine general medical examination at a health care facility Annual comprehensive exam was done including breast, excluding pelvic and PAP smear. All screenings have been addressed .   Thoracic vertebral fracture previously on EVista in 2008-09, followed by  alendronate therapy in 2010.  Bone density test in March 2014 showed improving osteopenia.     Updated Medication List Outpatient Encounter Prescriptions as of 12/04/2013  Medication Sig  . alendronate (FOSAMAX) 70 MG tablet Take 1 tablet (70 mg total) by mouth every 7 (seven) days. Take with a full glass of water on an empty stomach.  Marland Kitchen aspirin 81 MG tablet Take 160 mg by mouth daily.    . clopidogrel (PLAVIX) 75 MG tablet Take 1 tablet (75 mg total) by mouth daily.  Marland Kitchen losartan (COZAAR) 100 MG tablet Take 1 tablet (100 mg total) by mouth daily.  . metoprolol (TOPROL-XL) 200 MG 24 hr tablet Take 1 tablet by mouth daily.  . niacin 500 MG tablet Take 1 tablet (500 mg total) by mouth daily.  . nitrofurantoin (MACRODANTIN) 100 MG capsule Take 100 mg by mouth daily.    Marland Kitchen omeprazole (PRILOSEC) 20 MG capsule Take 1 capsule (20 mg total) by mouth daily.  Glory Rosebush  DELICA LANCETS MISC Check blood sugar once a day.  . ranolazine (RANEXA) 500 MG 12 hr tablet Take 500 mg by mouth 2 (two) times daily.  Marland Kitchen amLODipine (NORVASC) 5 MG tablet Take 1 tablet (5 mg total) by mouth daily.  . isosorbide mononitrate (IMDUR) 30 MG 24 hr tablet Take 2 tablets (60 mg total) by mouth daily.  . solifenacin (VESICARE) 10 MG tablet Take 10 mg by mouth daily.  Marland Kitchen spironolactone (ALDACTONE) 50 MG tablet Take 1 tablet (50 mg total) by mouth daily.

## 2013-12-04 NOTE — Progress Notes (Signed)
Pre-visit discussion using our clinic review tool. No additional management support is needed unless otherwise documented below in the visit note.  

## 2013-12-04 NOTE — Patient Instructions (Signed)
You had your annual wellness exam today  We will schedule your unilateral (left sided) mammogram in March  You are due for your 10 year Tetanus vaccine , according to our records (check with Employee health)    Please get fasting labs soon at the hospital  Return in 6 months for follow up on hypertension

## 2013-12-04 NOTE — Assessment & Plan Note (Signed)
Due for colonoscopy due to history of polyps ,  Her last colonoscopy was normal in  2010 by Loistine Simas

## 2013-12-05 DIAGNOSIS — Z0001 Encounter for general adult medical examination with abnormal findings: Secondary | ICD-10-CM | POA: Insufficient documentation

## 2013-12-05 NOTE — Assessment & Plan Note (Addendum)
Is not taking a statin despite known coronary artery disease. She will return for fasting lipids. I will recommend statin therapy regardless of her cholesterol levels once I see her liver and kidney functions.

## 2013-12-05 NOTE — Assessment & Plan Note (Signed)
She continues to have mild substernal chest pain which occurs at rest and with emotional stress. She underwent  recent cardiac catheterization and stent placement. x2 in the RCA  She's taking Ranexa.  has not had post procedure stress test or echocardiogram. She has not tried sublingual nitroglycerin for her pain. He follows up with Dr. Clayborn Bigness next month.

## 2013-12-05 NOTE — Assessment & Plan Note (Signed)
Annual comprehensive exam was done including breast, excluding pelvic and PAP smear. All screenings have been addressed .  

## 2013-12-05 NOTE — Assessment & Plan Note (Signed)
previously on EVista in 2008-09, followed by alendronate therapy in 2010.  Bone density test in March 2014 showed improving osteopenia.

## 2013-12-05 NOTE — Assessment & Plan Note (Signed)
Well controlled on current regimen. Renal function due for assessment , no changes today. 

## 2013-12-21 DIAGNOSIS — I209 Angina pectoris, unspecified: Secondary | ICD-10-CM | POA: Diagnosis not present

## 2013-12-21 DIAGNOSIS — I25810 Atherosclerosis of coronary artery bypass graft(s) without angina pectoris: Secondary | ICD-10-CM | POA: Diagnosis not present

## 2013-12-21 DIAGNOSIS — I10 Essential (primary) hypertension: Secondary | ICD-10-CM | POA: Diagnosis not present

## 2013-12-25 ENCOUNTER — Ambulatory Visit: Payer: Self-pay | Admitting: Internal Medicine

## 2013-12-25 ENCOUNTER — Other Ambulatory Visit: Payer: Self-pay | Admitting: Internal Medicine

## 2013-12-25 DIAGNOSIS — R5381 Other malaise: Secondary | ICD-10-CM | POA: Diagnosis not present

## 2013-12-25 DIAGNOSIS — Z79899 Other long term (current) drug therapy: Secondary | ICD-10-CM | POA: Diagnosis not present

## 2013-12-25 DIAGNOSIS — R5383 Other fatigue: Secondary | ICD-10-CM | POA: Diagnosis not present

## 2013-12-25 LAB — HEPATIC FUNCTION PANEL
ALT: 40 U/L — AB (ref 7–35)
ALT: 70 U/L — AB (ref 7–35)
AST: 17 U/L (ref 13–35)
AST: 17 U/L (ref 13–35)
Alkaline Phosphatase: 71 U/L (ref 25–125)
BILIRUBIN, TOTAL: 0.5 mg/dL

## 2013-12-25 LAB — CBC WITH DIFFERENTIAL/PLATELET
BASOS ABS: 0 10*3/uL (ref 0.0–0.1)
Basophil %: 0.7 %
Eosinophil #: 0.1 10*3/uL (ref 0.0–0.7)
Eosinophil %: 1.6 %
HCT: 38.5 % (ref 35.0–47.0)
HGB: 12.8 g/dL (ref 12.0–16.0)
LYMPHS ABS: 2 10*3/uL (ref 1.0–3.6)
Lymphocyte %: 27.7 %
MCH: 29.8 pg (ref 26.0–34.0)
MCHC: 33.2 g/dL (ref 32.0–36.0)
MCV: 90 fL (ref 80–100)
MONO ABS: 0.5 x10 3/mm (ref 0.2–0.9)
MONOS PCT: 7.3 %
NEUTROS ABS: 4.5 10*3/uL (ref 1.4–6.5)
Neutrophil %: 62.7 %
Platelet: 205 10*3/uL (ref 150–440)
RBC: 4.29 10*6/uL (ref 3.80–5.20)
RDW: 14.4 % (ref 11.5–14.5)
WBC: 7.2 10*3/uL (ref 3.6–11.0)

## 2013-12-25 LAB — COMPREHENSIVE METABOLIC PANEL
ANION GAP: 8 (ref 7–16)
Albumin: 3.7 g/dL (ref 3.4–5.0)
Alkaline Phosphatase: 71 U/L
BILIRUBIN TOTAL: 0.5 mg/dL (ref 0.2–1.0)
BUN: 17 mg/dL (ref 7–18)
CREATININE: 0.55 mg/dL — AB (ref 0.60–1.30)
Calcium, Total: 8.8 mg/dL (ref 8.5–10.1)
Chloride: 107 mmol/L (ref 98–107)
Co2: 24 mmol/L (ref 21–32)
EGFR (African American): >60
EGFR (Non-African Amer.): >60
Glucose: 111 mg/dL — ABNORMAL HIGH (ref 65–99)
OSMOLALITY: 280 (ref 275–301)
Potassium: 4 mmol/L (ref 3.5–5.1)
SGOT(AST): 17 U/L (ref 15–37)
SGPT (ALT): 40 U/L (ref 12–78)
Sodium: 139 mmol/L (ref 136–145)
Total Protein: 7 g/dL (ref 6.4–8.2)

## 2013-12-25 LAB — LIPID PANEL
Cholesterol: 163 mg/dL (ref 0–200)
Cholesterol: 163 mg/dL (ref 0–200)
HDL Cholesterol: 45 mg/dL (ref 40–60)
HDL: 45 mg/dL (ref 35–70)
LDL CHOLESTEROL, CALC: 100 mg/dL (ref 0–100)
LDL Cholesterol: 100 mg/dL
TRIGLYCERIDES: 88 mg/dL (ref 0–200)
TRIGLYCERIDES: 88 mg/dL (ref 40–160)
VLDL CHOLESTEROL, CALC: 18 mg/dL (ref 5–40)

## 2013-12-25 LAB — TSH
TSH: 2.73 u[IU]/mL (ref 0.41–5.90)
Thyroid Stimulating Horm: 2.73 u[IU]/mL

## 2013-12-25 LAB — CBC AND DIFFERENTIAL
HCT: 38 % (ref 36–46)
Hemoglobin: 12.8 g/dL (ref 12.0–16.0)
PLATELETS: 205 10*3/uL (ref 150–399)
WBC: 7.2 10^3/mL

## 2013-12-25 LAB — BASIC METABOLIC PANEL
BUN: 17 mg/dL (ref 4–21)
Creatinine: 0.6 mg/dL (ref 0.5–1.1)
Glucose: 111 mg/dL
POTASSIUM: 4 mmol/L (ref 3.4–5.3)
Sodium: 139 mmol/L (ref 137–147)

## 2013-12-26 ENCOUNTER — Telehealth: Payer: Self-pay | Admitting: Internal Medicine

## 2013-12-26 DIAGNOSIS — E785 Hyperlipidemia, unspecified: Secondary | ICD-10-CM

## 2013-12-26 MED ORDER — ATORVASTATIN CALCIUM 40 MG PO TABS: 40.0000 mg | ORAL_TABLET | Freq: Every day | ORAL | Status: DC

## 2013-12-26 NOTE — Telephone Encounter (Signed)
Your cholesterol, liver and kidney function have been reviewed. American college of Cardiology guidelines recommend starting patients with clinical heart diseaseWho are  < 69 yrs old to be placed on high intensity therapy .  Therefore, generic lipitor,  Atorvastatin,  40 mg dose, is  Recommended.  I will send rx to your pharmacy. If you develop significant muscle aching or joint pain that was not present prio o starting the medication, please let me know ASAP.  You will need to have repeat labs done in 8 weeks (fasting) at the office.   Regards,  Dr. Derrel Nip

## 2013-12-26 NOTE — Assessment & Plan Note (Signed)
New ACC guidelines recommend starting patients aged 69 or higher with clinical ASCVD on moderate intensity therapy and those aged < 75 on high intensity therapy Atorvastatin 40 mg dose recommended

## 2014-01-22 ENCOUNTER — Encounter: Payer: Self-pay | Admitting: Internal Medicine

## 2014-01-22 DIAGNOSIS — R7989 Other specified abnormal findings of blood chemistry: Secondary | ICD-10-CM

## 2014-02-02 ENCOUNTER — Ambulatory Visit: Payer: Self-pay | Admitting: Internal Medicine

## 2014-02-05 ENCOUNTER — Ambulatory Visit: Payer: Self-pay | Admitting: Gastroenterology

## 2014-02-05 DIAGNOSIS — Z800 Family history of malignant neoplasm of digestive organs: Secondary | ICD-10-CM | POA: Diagnosis not present

## 2014-02-05 DIAGNOSIS — Z86010 Personal history of colonic polyps: Secondary | ICD-10-CM | POA: Diagnosis not present

## 2014-02-07 ENCOUNTER — Telehealth: Payer: Self-pay | Admitting: Internal Medicine

## 2014-02-07 NOTE — Telephone Encounter (Signed)
The patient is asking to be referred to pulmonary rehab for shortness of breath

## 2014-02-08 ENCOUNTER — Ambulatory Visit: Payer: Self-pay | Admitting: Internal Medicine

## 2014-02-08 ENCOUNTER — Encounter: Payer: Self-pay | Admitting: Internal Medicine

## 2014-02-08 ENCOUNTER — Ambulatory Visit (INDEPENDENT_AMBULATORY_CARE_PROVIDER_SITE_OTHER): Payer: 59 | Admitting: Internal Medicine

## 2014-02-08 VITALS — BP 140/80 | HR 101 | Temp 99.1°F | Resp 18

## 2014-02-08 DIAGNOSIS — R0989 Other specified symptoms and signs involving the circulatory and respiratory systems: Secondary | ICD-10-CM

## 2014-02-08 DIAGNOSIS — E785 Hyperlipidemia, unspecified: Secondary | ICD-10-CM

## 2014-02-08 DIAGNOSIS — R7989 Other specified abnormal findings of blood chemistry: Secondary | ICD-10-CM

## 2014-02-08 DIAGNOSIS — R0689 Other abnormalities of breathing: Secondary | ICD-10-CM

## 2014-02-08 DIAGNOSIS — R0600 Dyspnea, unspecified: Secondary | ICD-10-CM

## 2014-02-08 DIAGNOSIS — S22009A Unspecified fracture of unspecified thoracic vertebra, initial encounter for closed fracture: Secondary | ICD-10-CM

## 2014-02-08 DIAGNOSIS — R0602 Shortness of breath: Secondary | ICD-10-CM | POA: Diagnosis not present

## 2014-02-08 DIAGNOSIS — R0601 Orthopnea: Secondary | ICD-10-CM | POA: Diagnosis not present

## 2014-02-08 DIAGNOSIS — J90 Pleural effusion, not elsewhere classified: Secondary | ICD-10-CM | POA: Diagnosis not present

## 2014-02-08 DIAGNOSIS — R0609 Other forms of dyspnea: Secondary | ICD-10-CM

## 2014-02-08 DIAGNOSIS — F458 Other somatoform disorders: Secondary | ICD-10-CM

## 2014-02-08 DIAGNOSIS — I2510 Atherosclerotic heart disease of native coronary artery without angina pectoris: Secondary | ICD-10-CM | POA: Diagnosis not present

## 2014-02-08 DIAGNOSIS — F449 Dissociative and conversion disorder, unspecified: Secondary | ICD-10-CM

## 2014-02-08 DIAGNOSIS — I517 Cardiomegaly: Secondary | ICD-10-CM | POA: Diagnosis not present

## 2014-02-08 LAB — COMPREHENSIVE METABOLIC PANEL
ALT: 82 U/L — ABNORMAL HIGH (ref 0–35)
AST: 52 U/L — AB (ref 0–37)
Albumin: 3.9 g/dL (ref 3.5–5.2)
Alkaline Phosphatase: 80 U/L (ref 39–117)
BILIRUBIN TOTAL: 0.5 mg/dL (ref 0.3–1.2)
BUN: 16 mg/dL (ref 6–23)
CALCIUM: 8.7 mg/dL (ref 8.4–10.5)
CO2: 26 mEq/L (ref 19–32)
CREATININE: 0.7 mg/dL (ref 0.4–1.2)
Chloride: 108 mEq/L (ref 96–112)
GFR: 91.16 mL/min (ref >60.00)
Glucose, Bld: 139 mg/dL — ABNORMAL HIGH (ref 70–99)
Potassium: 3.7 mEq/L (ref 3.5–5.1)
Sodium: 140 mEq/L (ref 135–145)
Total Protein: 6.3 g/dL (ref 6.0–8.3)

## 2014-02-08 LAB — HEPATIC FUNCTION PANEL
ALBUMIN: 3.9 g/dL (ref 3.5–5.2)
ALK PHOS: 80 U/L (ref 39–117)
ALT: 82 U/L — ABNORMAL HIGH (ref 0–35)
AST: 52 U/L — ABNORMAL HIGH (ref 0–37)
Bilirubin, Direct: 0.1 mg/dL (ref 0.0–0.3)
Total Bilirubin: 0.5 mg/dL (ref 0.3–1.2)
Total Protein: 6.3 g/dL (ref 6.0–8.3)

## 2014-02-08 LAB — FERRITIN: FERRITIN: 58.3 ng/mL (ref 10.0–291.0)

## 2014-02-08 LAB — BRAIN NATRIURETIC PEPTIDE: Pro B Natriuretic peptide (BNP): 599 pg/mL — ABNORMAL HIGH (ref 0.0–100.0)

## 2014-02-08 MED ORDER — POTASSIUM CHLORIDE CRYS ER 20 MEQ PO TBCR: 20.0000 meq | EXTENDED_RELEASE_TABLET | Freq: Every day | ORAL | Status: DC

## 2014-02-08 MED ORDER — FUROSEMIDE 20 MG PO TABS: 20.0000 mg | ORAL_TABLET | Freq: Every day | ORAL | Status: DC

## 2014-02-08 NOTE — Telephone Encounter (Signed)
Please put patient in at 2. Patient aware.

## 2014-02-08 NOTE — Progress Notes (Signed)
Patient ID: Stacie Mcdonald, female   DOB: 11-22-1945, 68 y.o.   MRN: [id number]   Patient Active Problem List   Diagnosis Date Noted  . Dyspnea and respiratory abnormality 02/11/2014  . Encounter for preventative adult health care exam with abnormal findings 12/05/2013  . Routine general medical examination at a health care facility 12/05/2012  . Accelerated hypertension 04/14/2012  . Vertebral compression fracture, initial encounter 04/14/2012  . Chronic neck pain 04/14/2012  . Thoracic vertebral fracture 04/05/2012  . Angina of effort 11/22/2011  . Screening for cervical cancer 11/22/2011  . Hyperlipidemia LDL goal < 70 11/22/2011  . Breast Cancer   . CAD (coronary artery disease)   . Colon polyps   . Osteopenia due to cancer therapy     Subjective:  CC:   Chief Complaint  Patient presents with  . Acute Visit  . Shortness of Breath    HPI:   Stacie Mcdonald is a 68 y.o. female who presents for  Progressive dyspnea since February .  Travelled to [place] and actually felt better while in [place] , could walk up two flights of stairs without difficulty.  However since she has returned now she can't walk from the front to the back of the house without breathing hard.   Quit her exercise  class at [employer] (4 times per week ) last week due to dyspnea getting so bad she couldn't keep up.  Had significant orthopnea last night .Has not had any chest or jaw pain but does have CAD and takes Ranexa for chronic anginal pain.   No history of tobacco use ,  Takes macrobid for chronic UTIs.  Has been taking symbicort and xopenex prescribed by Dr Kathyrn Sheriff for presumed asthma based on office spirometry  But has never had formal PFTs  Had cardiac catheterization  In August 2014 by Dr Clayborn Bigness after presenting with  unstable angina with stents placed.  Still on plavix for DESl.  DES nt placement in the proximal of the RCA (3 total in the RCA) and one in the distal    Past Medical History  Diagnosis  Date  . Arthritis   . Asthma   . Chicken pox   . GERD (gastroesophageal reflux disease)   . Allergy   . Heart disease   . Hypertension   . Hyperlipidemia   . Rheumatic fever   . Urinary tract infection   . CAD (coronary artery disease) 2008    50% LAD occlusion 2009,  stent in mid LAD 2008 for 95%   . Colon polyps April 2009    by Colonoscopy, Gustavo Lah  . Osteopenia due to cancer therapy Jan 2011    T score -2.1  . Breast Cancer 1991    R mastectomy. XRT , chemo    Past Surgical History  Procedure Laterality Date  . Breast surgery      right mastectomy  . Coronary angioplasty with stent placement  Jan 2013    mid RCA 75% occlusion resolved to 0%, [place]       The following portions of the patient's history were reviewed and updated as appropriate: Allergies, current medications, and problem list.    Review of Systems:   Patient denies headache, fevers, malaise, unintentional weight loss, skin rash, eye pain, sinus congestion and sinus pain, sore throat, dysphagia,  hemoptysis , cough, dyspnea, wheezing, chest pain, palpitations, orthopnea, edema, abdominal pain, nausea, melena, diarrhea, constipation, flank pain, dysuria, hematuria, urinary  Frequency, nocturia, numbness, tingling, seizures,  Focal weakness,  Loss of consciousness,  Tremor, insomnia, depression, anxiety, and suicidal ideation.     History   Social History  . Marital Status: Married    Spouse Name: N/A    Number of Children: N/A  . Years of Education: N/A   Occupational History  . Not on file.   Social History Main Topics  . Smoking status: Never Smoker   . Smokeless tobacco: Never Used  . Alcohol Use: No  . Drug Use: No  . Sexual Activity: Not on file   Other Topics Concern  . Not on file   Social History Narrative  . No narrative on file    Objective:  Filed Vitals:   02/08/14 1402  BP: 140/80  Pulse: 101  Temp: 99.1 F (37.3 C)  Resp: 18     General appearance: alert,  cooperative and appears stated age Ears: normal TM's and external ear canals both ears Throat: lips, mucosa, and tongue normal; teeth and gums normal Neck: no adenopathy, no carotid bruit, supple, symmetrical, trachea midline and thyroid not enlarged, symmetric, no tenderness/mass/nodules Back: symmetric, no curvature. ROM normal. No CVA tenderness. Lungs: clear to auscultation bilaterally Heart: regular rate and rhythm, S1, S2 normal, no murmur, click, rub or gallop Abdomen: soft, non-tender; bowel sounds normal; no masses,  no organomegaly Pulses: 2+ and symmetric Skin: Skin color, texture, turgor normal. No rashes or lesions Lymph nodes: Cervical, supraclavicular, and axillary nodes normal.  Assessment and Plan:  Thoracic vertebral fracture previously on EVista in 2008-09, followed by alendronate therapy in 2010.  Bone density test in March 2014 showed improving osteopenia.      Dyspnea and respiratory abnormality CT of chest with contrast ruled out PE but noted cardiomegaly intersititial edema.  BNP was 500.  Patient was prescribed furosemide 20 mg daily and potassium chloride 20 meQ  ,  Discussed with Dr Clayborn Bigness, who will reevaluate patient witih ECHO  A total of 40 minutes was spent with patient more than half of which was spent in counseling, reviewing records from other prviders and coordination of care.  Updated Medication List Outpatient Encounter Prescriptions as of 02/08/2014  Medication Sig  . alendronate (FOSAMAX) 70 MG tablet Take 1 tablet (70 mg total) by mouth every 7 (seven) days. Take with a full glass of water on an empty stomach.  Marland Kitchen aspirin 81 MG tablet Take 160 mg by mouth daily.    . clopidogrel (PLAVIX) 75 MG tablet TAKE ONE TABLET BY MOUTH DAILY  . losartan (COZAAR) 100 MG tablet Take 1 tablet (100 mg total) by mouth daily.  . metoprolol (TOPROL-XL) 200 MG 24 hr tablet Take 1 tablet by mouth daily.  . niacin 500 MG tablet Take 1 tablet (500 mg total) by  mouth daily.  . nitrofurantoin (MACRODANTIN) 100 MG capsule Take 100 mg by mouth daily.    Glory Rosebush DELICA LANCETS MISC Check blood sugar once a day.  . ranolazine (RANEXA) 500 MG 12 hr tablet Take 500 mg by mouth 2 (two) times daily.  Marland Kitchen amLODipine (NORVASC) 5 MG tablet Take 1 tablet (5 mg total) by mouth daily.  Marland Kitchen atorvastatin (LIPITOR) 40 MG tablet Take 1 tablet (40 mg total) by mouth daily.  . furosemide (LASIX) 20 MG tablet Take 1 tablet (20 mg total) by mouth daily.  . isosorbide mononitrate (IMDUR) 30 MG 24 hr tablet Take 2 tablets (60 mg total) by mouth daily.  Marland Kitchen omeprazole (PRILOSEC) 20 MG capsule Take 1 capsule (20 mg total) by mouth daily.  Marland Kitchen  potassium chloride SA (K-DUR,KLOR-CON) 20 MEQ tablet Take 1 tablet (20 mEq total) by mouth daily.  . solifenacin (VESICARE) 10 MG tablet Take 10 mg by mouth daily.  Marland Kitchen spironolactone (ALDACTONE) 50 MG tablet Take 1 tablet (50 mg total) by mouth daily.      Updated Medication List Outpatient Encounter Prescriptions as of 02/08/2014  Medication Sig  . alendronate (FOSAMAX) 70 MG tablet Take 1 tablet (70 mg total) by mouth every 7 (seven) days. Take with a full glass of water on an empty stomach.  Marland Kitchen aspirin 81 MG tablet Take 160 mg by mouth daily.    . clopidogrel (PLAVIX) 75 MG tablet TAKE ONE TABLET BY MOUTH DAILY  . losartan (COZAAR) 100 MG tablet Take 1 tablet (100 mg total) by mouth daily.  . metoprolol (TOPROL-XL) 200 MG 24 hr tablet Take 1 tablet by mouth daily.  . niacin 500 MG tablet Take 1 tablet (500 mg total) by mouth daily.  . nitrofurantoin (MACRODANTIN) 100 MG capsule Take 100 mg by mouth daily.    Glory Rosebush DELICA LANCETS MISC Check blood sugar once a day.  . ranolazine (RANEXA) 500 MG 12 hr tablet Take 500 mg by mouth 2 (two) times daily.  Marland Kitchen amLODipine (NORVASC) 5 MG tablet Take 1 tablet (5 mg total) by mouth daily.  Marland Kitchen atorvastatin (LIPITOR) 40 MG tablet Take 1 tablet (40 mg total) by mouth daily.  . furosemide (LASIX) 20  MG tablet Take 1 tablet (20 mg total) by mouth daily.  . isosorbide mononitrate (IMDUR) 30 MG 24 hr tablet Take 2 tablets (60 mg total) by mouth daily.  Marland Kitchen omeprazole (PRILOSEC) 20 MG capsule Take 1 capsule (20 mg total) by mouth daily.  . potassium chloride SA (K-DUR,KLOR-CON) 20 MEQ tablet Take 1 tablet (20 mEq total) by mouth daily.  . solifenacin (VESICARE) 10 MG tablet Take 10 mg by mouth daily.  Marland Kitchen spironolactone (ALDACTONE) 50 MG tablet Take 1 tablet (50 mg total) by mouth daily.     Orders Placed This Encounter  Procedures  . CT Chest W Contrast  . D-Dimer, Quantitative  . B Nat Peptide  . Pulmonary function test    No Follow-up on file.

## 2014-02-08 NOTE — Progress Notes (Signed)
Pre-visit discussion using our clinic review tool. No additional management support is needed unless otherwise documented below in the visit note.  

## 2014-02-08 NOTE — Patient Instructions (Signed)
I am starting you on low dose lasix and potassium for the edema CT of chest to rule out PE  If normal,  PFTS..  If normal, repeat ECHO/stress test ([facility])

## 2014-02-08 NOTE — Assessment & Plan Note (Signed)
previously on EVista in 2008-09, followed by alendronate therapy in 2010.  Bone density test in March 2014 showed improving osteopenia.

## 2014-02-08 NOTE — Telephone Encounter (Signed)
Appointment made

## 2014-02-09 ENCOUNTER — Telehealth: Payer: Self-pay | Admitting: Internal Medicine

## 2014-02-09 LAB — ANTI-SMITH ANTIBODY: ENA SM AB SER-ACNC: NEGATIVE

## 2014-02-09 LAB — IRON AND TIBC
%SAT: 17 % — AB (ref 20–55)
Iron: 53 ug/dL (ref 42–145)
TIBC: 316 ug/dL (ref 250–470)
UIBC: 263 ug/dL (ref 125–400)

## 2014-02-09 LAB — D-DIMER, QUANTITATIVE (NOT AT ARMC): D DIMER QUANT: 0.5 ug{FEU}/mL — AB (ref 0.00–0.48)

## 2014-02-09 LAB — ANA: Anti Nuclear Antibody(ANA): NEGATIVE

## 2014-02-09 NOTE — Telephone Encounter (Signed)
Patient notified and lab scheduled.

## 2014-02-09 NOTE — Telephone Encounter (Signed)
Her CT suggested heart failure as the cause of her symptoms  I have spoken with dr Clayborn Bigness and he's going to arrange a follow up with her to reasses her heart . In the meantime take the daily lasix and potassium and weigh yourself daily . Return in one week for a BMET

## 2014-02-10 LAB — HM DIABETES EYE EXAM

## 2014-02-11 ENCOUNTER — Telehealth: Payer: Self-pay | Admitting: Internal Medicine

## 2014-02-11 DIAGNOSIS — J984 Other disorders of lung: Secondary | ICD-10-CM | POA: Insufficient documentation

## 2014-02-11 NOTE — Assessment & Plan Note (Signed)
CT of chest with contrast ruled out PE but noted cardiomegaly intersititial edema.  BNP was 500.  Patient was prescribed furosemide 20 mg daily and potassium chloride 20 meQ  ,  Discussed with Dr Clayborn Bigness, who will reevaluate patient witih ECHO

## 2014-02-11 NOTE — Telephone Encounter (Signed)
Your labs are normal except   Your liver enzymes are a little elevated, which can be a sign of inflammation of the liver due to various reasons (which include viral hepatitis,  Statin intolerance,  autoimmune hepatitis and fatty liver ).  I would like you to return in a month for a lab draw to run the liver test again along with some additional tests to rule out the other causes.   You do not need to be fasting, but should call the office for a lab appt.

## 2014-02-12 ENCOUNTER — Encounter: Payer: Self-pay | Admitting: *Deleted

## 2014-02-12 ENCOUNTER — Telehealth: Payer: Self-pay | Admitting: Internal Medicine

## 2014-02-12 DIAGNOSIS — I2510 Atherosclerotic heart disease of native coronary artery without angina pectoris: Secondary | ICD-10-CM

## 2014-02-12 DIAGNOSIS — R0609 Other forms of dyspnea: Secondary | ICD-10-CM | POA: Diagnosis not present

## 2014-02-12 DIAGNOSIS — R0689 Other abnormalities of breathing: Secondary | ICD-10-CM

## 2014-02-12 DIAGNOSIS — I10 Essential (primary) hypertension: Secondary | ICD-10-CM | POA: Diagnosis not present

## 2014-02-12 DIAGNOSIS — R0600 Dyspnea, unspecified: Secondary | ICD-10-CM

## 2014-02-12 DIAGNOSIS — I5032 Chronic diastolic (congestive) heart failure: Secondary | ICD-10-CM | POA: Diagnosis not present

## 2014-02-12 NOTE — Telephone Encounter (Signed)
Letter mailed to patient.

## 2014-02-15 ENCOUNTER — Other Ambulatory Visit (INDEPENDENT_AMBULATORY_CARE_PROVIDER_SITE_OTHER): Payer: 59

## 2014-02-15 DIAGNOSIS — E785 Hyperlipidemia, unspecified: Secondary | ICD-10-CM

## 2014-02-15 LAB — LIPID PANEL
Cholesterol: 187 mg/dL (ref 0–200)
HDL: 44.6 mg/dL (ref >39.00)
LDL CALC: 112 mg/dL — AB (ref 0–99)
Total CHOL/HDL Ratio: 4
Triglycerides: 154 mg/dL — ABNORMAL HIGH (ref 0.0–149.0)
VLDL: 30.8 mg/dL (ref 0.0–40.0)

## 2014-02-18 ENCOUNTER — Encounter: Payer: Self-pay | Admitting: Internal Medicine

## 2014-02-18 MED ORDER — ATORVASTATIN CALCIUM 80 MG PO TABS: 40.0000 mg | ORAL_TABLET | Freq: Every day | ORAL | Status: DC

## 2014-02-18 NOTE — Addendum Note (Signed)
Addended by: Crecencio Mc on: 02/18/2014 06:15 PM   Modules accepted: Orders

## 2014-02-18 NOTE — Assessment & Plan Note (Signed)
Dose increased to 80 mg for LDL of 112.

## 2014-02-19 ENCOUNTER — Encounter: Payer: Self-pay | Admitting: Internal Medicine

## 2014-02-19 NOTE — Telephone Encounter (Signed)
Please advise 

## 2014-02-19 NOTE — Telephone Encounter (Signed)
Patient has been notified she can postpone PFT.

## 2014-02-20 ENCOUNTER — Encounter: Payer: Self-pay | Admitting: Internal Medicine

## 2014-02-27 ENCOUNTER — Other Ambulatory Visit: Payer: Self-pay | Admitting: Internal Medicine

## 2014-03-05 DIAGNOSIS — I10 Essential (primary) hypertension: Secondary | ICD-10-CM | POA: Diagnosis not present

## 2014-03-05 DIAGNOSIS — I509 Heart failure, unspecified: Secondary | ICD-10-CM | POA: Diagnosis not present

## 2014-03-08 ENCOUNTER — Encounter: Payer: Self-pay | Admitting: Internal Medicine

## 2014-03-12 ENCOUNTER — Other Ambulatory Visit: Payer: Self-pay | Admitting: Internal Medicine

## 2014-03-14 ENCOUNTER — Other Ambulatory Visit: Payer: Self-pay | Admitting: Internal Medicine

## 2014-03-16 ENCOUNTER — Encounter: Payer: Self-pay | Admitting: Internal Medicine

## 2014-03-16 MED ORDER — NIACIN 500 MG PO TABS: 500.0000 mg | ORAL_TABLET | Freq: Every day | ORAL | Status: DC

## 2014-03-16 MED ORDER — OMEPRAZOLE 20 MG PO CPDR: DELAYED_RELEASE_CAPSULE | ORAL | Status: DC

## 2014-03-16 MED ORDER — ALENDRONATE SODIUM 70 MG PO TABS: 70.0000 mg | ORAL_TABLET | ORAL | Status: DC

## 2014-03-16 MED ORDER — LOSARTAN POTASSIUM 100 MG PO TABS: ORAL_TABLET | ORAL | Status: DC

## 2014-03-16 MED ORDER — METOPROLOL SUCCINATE ER 200 MG PO TB24: ORAL_TABLET | ORAL | Status: DC

## 2014-03-16 NOTE — Telephone Encounter (Signed)
Ok refill? 

## 2014-03-18 MED ORDER — NITROFURANTOIN MACROCRYSTAL 100 MG PO CAPS
100.0000 mg | ORAL_CAPSULE | Freq: Every day | ORAL | Status: DC
Start: ? — End: 2015-01-23

## 2014-03-20 ENCOUNTER — Other Ambulatory Visit (INDEPENDENT_AMBULATORY_CARE_PROVIDER_SITE_OTHER): Payer: Medicare Other

## 2014-03-20 ENCOUNTER — Other Ambulatory Visit: Payer: Self-pay | Admitting: Internal Medicine

## 2014-03-20 ENCOUNTER — Telehealth: Payer: Self-pay | Admitting: *Deleted

## 2014-03-20 DIAGNOSIS — Z79899 Other long term (current) drug therapy: Secondary | ICD-10-CM | POA: Diagnosis not present

## 2014-03-20 DIAGNOSIS — E785 Hyperlipidemia, unspecified: Secondary | ICD-10-CM

## 2014-03-20 DIAGNOSIS — E559 Vitamin D deficiency, unspecified: Secondary | ICD-10-CM | POA: Diagnosis not present

## 2014-03-20 LAB — COMPREHENSIVE METABOLIC PANEL
ALBUMIN: 4.2 g/dL (ref 3.5–5.2)
ALT: 29 U/L (ref 0–35)
AST: 23 U/L (ref 0–37)
Alkaline Phosphatase: 64 U/L (ref 39–117)
BUN: 16 mg/dL (ref 6–23)
CO2: 28 mEq/L (ref 19–32)
Calcium: 9.5 mg/dL (ref 8.4–10.5)
Chloride: 101 mEq/L (ref 96–112)
Creatinine, Ser: 0.7 mg/dL (ref 0.4–1.2)
GFR: 96 mL/min (ref >60.00)
GLUCOSE: 101 mg/dL — AB (ref 70–99)
POTASSIUM: 4 meq/L (ref 3.5–5.1)
Sodium: 139 mEq/L (ref 135–145)
TOTAL PROTEIN: 7.2 g/dL (ref 6.0–8.3)
Total Bilirubin: 0.5 mg/dL (ref 0.2–1.2)

## 2014-03-20 NOTE — Telephone Encounter (Signed)
What labs and dx?  

## 2014-03-21 ENCOUNTER — Encounter: Payer: Self-pay | Admitting: Internal Medicine

## 2014-03-21 LAB — LIPID PANEL
Cholesterol: 201 mg/dL — ABNORMAL HIGH (ref 0–200)
HDL: 49 mg/dL (ref >39)
LDL CALC: 123 mg/dL — AB (ref 0–99)
TRIGLYCERIDES: 145 mg/dL (ref <150)
Total CHOL/HDL Ratio: 4.1 Ratio
VLDL: 29 mg/dL (ref 0–40)

## 2014-03-21 LAB — VITAMIN D 25 HYDROXY (VIT D DEFICIENCY, FRACTURES): Vit D, 25-Hydroxy: 41 ng/mL (ref 30–89)

## 2014-03-30 ENCOUNTER — Encounter: Payer: Self-pay | Admitting: Internal Medicine

## 2014-04-10 ENCOUNTER — Encounter: Payer: Self-pay | Admitting: Internal Medicine

## 2014-04-12 LAB — HM COLONOSCOPY: HM Colonoscopy: NORMAL

## 2014-06-04 ENCOUNTER — Ambulatory Visit: Payer: 59 | Admitting: Internal Medicine

## 2014-07-05 DIAGNOSIS — R0602 Shortness of breath: Secondary | ICD-10-CM | POA: Diagnosis not present

## 2014-07-05 DIAGNOSIS — I10 Essential (primary) hypertension: Secondary | ICD-10-CM | POA: Diagnosis not present

## 2014-07-05 DIAGNOSIS — E785 Hyperlipidemia, unspecified: Secondary | ICD-10-CM | POA: Diagnosis not present

## 2014-07-05 DIAGNOSIS — E669 Obesity, unspecified: Secondary | ICD-10-CM | POA: Diagnosis not present

## 2014-08-17 ENCOUNTER — Telehealth: Payer: Self-pay | Admitting: Internal Medicine

## 2014-08-17 MED ORDER — METOPROLOL SUCCINATE ER 200 MG PO TB24: ORAL_TABLET | ORAL | Status: DC

## 2014-08-17 MED ORDER — OMEPRAZOLE 20 MG PO CPDR: DELAYED_RELEASE_CAPSULE | ORAL | Status: DC

## 2014-08-17 NOTE — Telephone Encounter (Signed)
Forms in red folder last OV 3/15 ok to fill?

## 2014-08-17 NOTE — Telephone Encounter (Signed)
Ok to refill,  Refill sent  

## 2014-08-17 NOTE — Telephone Encounter (Signed)
The patient brought in a a prescription request form to be filled out for Metoprolol and Omeprazole . The request were put in Dr. Lupita Dawn Box.

## 2014-08-20 NOTE — Telephone Encounter (Signed)
Pt.notified

## 2014-10-05 DIAGNOSIS — L720 Epidermal cyst: Secondary | ICD-10-CM | POA: Diagnosis not present

## 2014-10-05 DIAGNOSIS — L578 Other skin changes due to chronic exposure to nonionizing radiation: Secondary | ICD-10-CM | POA: Diagnosis not present

## 2014-10-05 DIAGNOSIS — I8393 Asymptomatic varicose veins of bilateral lower extremities: Secondary | ICD-10-CM | POA: Diagnosis not present

## 2014-10-05 DIAGNOSIS — L859 Epidermal thickening, unspecified: Secondary | ICD-10-CM | POA: Diagnosis not present

## 2014-10-05 DIAGNOSIS — D2339 Other benign neoplasm of skin of other parts of face: Secondary | ICD-10-CM | POA: Diagnosis not present

## 2014-10-05 DIAGNOSIS — D179 Benign lipomatous neoplasm, unspecified: Secondary | ICD-10-CM | POA: Diagnosis not present

## 2014-10-05 DIAGNOSIS — L821 Other seborrheic keratosis: Secondary | ICD-10-CM | POA: Diagnosis not present

## 2014-10-05 DIAGNOSIS — D1800 Hemangioma unspecified site: Secondary | ICD-10-CM | POA: Diagnosis not present

## 2014-10-05 DIAGNOSIS — Z1283 Encounter for screening for malignant neoplasm of skin: Secondary | ICD-10-CM | POA: Diagnosis not present

## 2014-11-22 ENCOUNTER — Encounter: Payer: Self-pay | Admitting: *Deleted

## 2014-11-22 NOTE — Telephone Encounter (Signed)
From: Chivon T [place] Sent: 08/05/2014 9:07 PM EDT To: Aviva Kluver Subject: RE:Flu Shot Clinic Saturday August 11, 2014  I took flu shot at [facility] Sept 16, 2015.  ----- Message ----- From: Genia Plants Sent: 08/04/2014 6:37 PM EDT To: Sallyanne T [place] Subject: Flu Shot Clinic Saturday August 11, 2014  [facility] will hold a [facility] on Saturday, September 26 from 9 am to noon. In order to plan appropriately, we ask you to please call the office to schedule an appointment time during the clinic. Our schedulers are ready to assist you, Monday through Friday, 8 am to 5 pm at [phone]. Thank you for choosing [facility] for your healthcare needs.

## 2015-01-22 DIAGNOSIS — I10 Essential (primary) hypertension: Secondary | ICD-10-CM | POA: Diagnosis not present

## 2015-01-22 DIAGNOSIS — I5032 Chronic diastolic (congestive) heart failure: Secondary | ICD-10-CM | POA: Diagnosis not present

## 2015-01-22 DIAGNOSIS — R0602 Shortness of breath: Secondary | ICD-10-CM | POA: Diagnosis not present

## 2015-01-22 DIAGNOSIS — E669 Obesity, unspecified: Secondary | ICD-10-CM | POA: Diagnosis not present

## 2015-01-23 ENCOUNTER — Encounter: Payer: Self-pay | Admitting: Internal Medicine

## 2015-01-23 MED ORDER — NITROFURANTOIN MACROCRYSTAL 100 MG PO CAPS: 100.0000 mg | ORAL_CAPSULE | Freq: Every day | ORAL | Status: DC

## 2015-01-23 NOTE — Telephone Encounter (Signed)
See mychart message. Has appt scheduled 02/11/15, last visit 02/08/14, ok refill?

## 2015-01-23 NOTE — Telephone Encounter (Signed)
Ok to refill,  Refill sent  

## 2015-02-11 ENCOUNTER — Ambulatory Visit (INDEPENDENT_AMBULATORY_CARE_PROVIDER_SITE_OTHER): Payer: Medicare Other | Admitting: Internal Medicine

## 2015-02-11 ENCOUNTER — Encounter: Payer: Self-pay | Admitting: Internal Medicine

## 2015-02-11 VITALS — BP 130/70 | HR 88 | Temp 99.0°F | Ht 62.75 in | Wt 184.5 lb

## 2015-02-11 DIAGNOSIS — Z23 Encounter for immunization: Secondary | ICD-10-CM | POA: Diagnosis not present

## 2015-02-11 DIAGNOSIS — Z0000 Encounter for general adult medical examination without abnormal findings: Secondary | ICD-10-CM | POA: Diagnosis not present

## 2015-02-11 LAB — HM DIABETES FOOT EXAM: HM Diabetic Foot Exam: NORMAL

## 2015-02-11 NOTE — Patient Instructions (Signed)

## 2015-02-11 NOTE — Progress Notes (Signed)
Pre visit review using our clinic review tool, if applicable. No additional management support is needed unless otherwise documented below in the visit note. 

## 2015-02-11 NOTE — Progress Notes (Signed)
Patient ID: Stacie Mcdonald, female   DOB: 06/09/1946, 69 y.o.   MRN: [id number]  The patient is here for annual Medicare wellness examination and management of other chronic and acute problems.  She is s/p right mastectomy with reconstruction ,  No longer followed by [facility].   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  Has been working in the yard  A lot since then Occasional insomnia,  No other issues Up to date on colonoscopy.  Needs mammogram screening unilateral left.   The risk factors are reflected in the social history.  The roster of all physicians providing medical care to patient - is listed in the Snapshot section of the chart.  Activities of daily living:  The patient is 100% independent in all ADLs: dressing, toileting, feeding as well as independent mobility  Home safety : The patient has smoke detectors in the home. They wear seatbelts.  There are no firearms at home. There is no violence in the home.   There is no risks for hepatitis, STDs or HIV. There is no   history of blood transfusion. They have no travel history to infectious disease endemic areas of the world.  The patient has seen their dentist in the last six month. They have seen their eye doctor in the last year. They admit to slight hearing difficulty with regard to whispered voices and some television programs.  They have deferred audiologic testing in the last year.  They do not  have excessive sun exposure. Discussed the need for sun protection: hats, long sleeves and use of sunscreen if there is significant sun exposure.   Diet: the importance of a healthy diet is discussed. They do have a healthy diet.  The benefits of regular aerobic exercise were discussed. She walks 4 times per week ,  20 minutes.   Depression screen: there are no signs or vegative symptoms of depression- irritability, change in appetite, anhedonia, sadness/tearfullness.  Cognitive assessment: the patient manages all their  financial and personal affairs and is actively engaged. They could relate day,date,year and events; recalled 2/3 objects at 3 minutes; performed clock-face test normally.  The following portions of the patient's history were reviewed and updated as appropriate: allergies, current medications, past family history, past medical history,  past surgical history, past social history  and problem list.  Visual acuity was not assessed per patient preference since she has regular follow up with her ophthalmologist. Hearing and body mass index were assessed and reviewed.   During the course of the visit the patient was educated and counseled about appropriate screening and preventive services including : fall prevention , diabetes screening, nutrition counseling, colorectal cancer screening, and recommended immunizations.  Review of Systems  Patient denies headache, fevers, malaise, unintentional weight loss, skin rash, eye pain, sinus congestion and sinus pain, sore throat, dysphagia,  hemoptysis , cough, dyspnea, wheezing, chest pain, palpitations, orthopnea, edema, abdominal pain, nausea, melena, diarrhea, constipation, flank pain, dysuria, hematuria, urinary  Frequency, nocturia, numbness, tingling, seizures,  Focal weakness, Loss of consciousness,  Tremor, insomnia, depression, anxiety, and suicidal ideation.    Objective :  BP 130/70 mmHg  Pulse 88  Temp(Src) 99 F (37.2 C) (Oral)  Ht 5' 2.75" (1.594 m)  Wt 184 lb 8 oz (83.689 kg)  BMI 32.94 kg/m2  SpO2 95%  General appearance: alert, cooperative and appears stated age Head: Normocephalic, without obvious abnormality, atraumatic Eyes: conjunctivae/corneas clear. PERRL, EOM's intact. Fundi benign. Ears: normal TM's and external  ear canals both ears Nose: Nares normal. Septum midline. Mucosa normal. No drainage or sinus tenderness. Throat: lips, mucosa, and tongue normal; teeth and gums normal Neck: no adenopathy, no carotid bruit, no JVD,  supple, symmetrical, trachea midline and thyroid not enlarged, symmetric, no tenderness/mass/nodules Lungs: clear to auscultation bilaterally Breasts: right mastectomy with flap reconstruction,, no masses or tenderness Heart: regular rate and rhythm, S1, S2 normal, no murmur, click, rub or gallop Abdomen: soft, non-tender; bowel sounds normal; no masses,  no organomegaly Extremities: extremities normal, atraumatic, no cyanosis or edema Pulses: 2+ and symmetric Skin: Skin color, texture, turgor normal. No rashes or lesions Neurologic: Alert and oriented X 3, normal strength and tone. Normal symmetric reflexes. Normal coordination and gait.   Assessment and Plan:   Problem List Items Addressed This Visit      Unprioritized   Routine general medical examination at a health care facility - Primary    Annual Medicare wellness  exam was done as well as a comprehensive physical exam and management of acute and chronic conditions .  During the course of the visit the patient was educated and counseled about appropriate screening and preventive services including : fall prevention , diabetes screening, nutrition counseling, colorectal cancer screening, and recommended immunizations.  Printed recommendations for health maintenance screenings was given.        Other Visit Diagnoses    Need for prophylactic vaccination against Streptococcus pneumoniae (pneumococcus)        Relevant Orders    Pneumococcal conjugate vaccine 13-valent (Completed)

## 2015-02-13 DIAGNOSIS — Z23 Encounter for immunization: Secondary | ICD-10-CM | POA: Diagnosis not present

## 2015-02-13 NOTE — Assessment & Plan Note (Signed)

## 2015-02-14 ENCOUNTER — Telehealth: Payer: Self-pay | Admitting: *Deleted

## 2015-02-14 DIAGNOSIS — I10 Essential (primary) hypertension: Secondary | ICD-10-CM

## 2015-02-14 DIAGNOSIS — E559 Vitamin D deficiency, unspecified: Secondary | ICD-10-CM

## 2015-02-14 DIAGNOSIS — E785 Hyperlipidemia, unspecified: Secondary | ICD-10-CM

## 2015-02-14 DIAGNOSIS — R739 Hyperglycemia, unspecified: Secondary | ICD-10-CM

## 2015-02-14 DIAGNOSIS — Z1159 Encounter for screening for other viral diseases: Secondary | ICD-10-CM

## 2015-02-14 DIAGNOSIS — R5383 Other fatigue: Secondary | ICD-10-CM

## 2015-02-14 NOTE — Telephone Encounter (Signed)
Pt coming in tomorrow what labs and dx?  

## 2015-02-15 ENCOUNTER — Other Ambulatory Visit (INDEPENDENT_AMBULATORY_CARE_PROVIDER_SITE_OTHER): Payer: Medicare Other

## 2015-02-15 DIAGNOSIS — R5383 Other fatigue: Secondary | ICD-10-CM

## 2015-02-15 DIAGNOSIS — Z1159 Encounter for screening for other viral diseases: Secondary | ICD-10-CM | POA: Diagnosis not present

## 2015-02-15 DIAGNOSIS — I10 Essential (primary) hypertension: Secondary | ICD-10-CM

## 2015-02-15 DIAGNOSIS — E559 Vitamin D deficiency, unspecified: Secondary | ICD-10-CM | POA: Diagnosis not present

## 2015-02-15 DIAGNOSIS — R739 Hyperglycemia, unspecified: Secondary | ICD-10-CM | POA: Diagnosis not present

## 2015-02-15 DIAGNOSIS — E785 Hyperlipidemia, unspecified: Secondary | ICD-10-CM | POA: Diagnosis not present

## 2015-02-15 LAB — COMPREHENSIVE METABOLIC PANEL
ALT: 59 U/L — ABNORMAL HIGH (ref 0–35)
AST: 35 U/L (ref 0–37)
Albumin: 4.5 g/dL (ref 3.5–5.2)
Alkaline Phosphatase: 63 U/L (ref 39–117)
BUN: 13 mg/dL (ref 6–23)
CALCIUM: 9.8 mg/dL (ref 8.4–10.5)
CHLORIDE: 99 meq/L (ref 96–112)
CO2: 33 meq/L — AB (ref 19–32)
Creatinine, Ser: 0.64 mg/dL (ref 0.40–1.20)
GFR: 97.47 mL/min (ref >60.00)
GLUCOSE: 113 mg/dL — AB (ref 70–99)
Potassium: 3.8 mEq/L (ref 3.5–5.1)
SODIUM: 137 meq/L (ref 135–145)
TOTAL PROTEIN: 7.6 g/dL (ref 6.0–8.3)
Total Bilirubin: 0.6 mg/dL (ref 0.2–1.2)

## 2015-02-15 LAB — LIPID PANEL
CHOL/HDL RATIO: 5
Cholesterol: 198 mg/dL (ref 0–200)
HDL: 39.6 mg/dL (ref >39.00)
LDL Cholesterol: 125 mg/dL — ABNORMAL HIGH (ref 0–99)
NonHDL: 158.4
Triglycerides: 168 mg/dL — ABNORMAL HIGH (ref 0.0–149.0)
VLDL: 33.6 mg/dL (ref 0.0–40.0)

## 2015-02-15 LAB — MICROALBUMIN / CREATININE URINE RATIO
CREATININE, U: 15.9 mg/dL
Microalb Creat Ratio: 4.4 mg/g (ref 0.0–30.0)
Microalb, Ur: <0.7 mg/dL (ref 0.0–1.9)

## 2015-02-15 LAB — VITAMIN D 25 HYDROXY (VIT D DEFICIENCY, FRACTURES): VITD: 23.95 ng/mL — ABNORMAL LOW (ref 30.00–100.00)

## 2015-02-15 LAB — HEMOGLOBIN A1C: HEMOGLOBIN A1C: 6.7 % — AB (ref 4.6–6.5)

## 2015-02-15 LAB — TSH: TSH: 4.08 u[IU]/mL (ref 0.35–4.50)

## 2015-02-16 LAB — HEPATITIS C ANTIBODY: HCV Ab: NEGATIVE

## 2015-02-17 ENCOUNTER — Encounter: Payer: Self-pay | Admitting: Internal Medicine

## 2015-02-17 DIAGNOSIS — E119 Type 2 diabetes mellitus without complications: Secondary | ICD-10-CM | POA: Insufficient documentation

## 2015-03-07 NOTE — Progress Notes (Signed)
Patient received Pneumo Vax on 02/11/15 and tolerated well as prescribed by MD.

## 2015-03-08 NOTE — H&P (Signed)
PATIENT NAME:  Stacie Mcdonald, Jossalin T MR#:  [id number] DATE OF BIRTH:  03/23/1946  DATE OF ADMISSION:  05/22/2013  PRIMARY CARE PHYSICIAN:  Dr. Derrel Nip.   INDICATION:  Unstable angina.   HISTORY OF PRESENT ILLNESS:  Mrs. Stacie Mcdonald is a 69 year old white female who presented with recent onset of chest pain, tightness, shortness of breath while at home.  It had gotten progressively worse over the last day or two with discomfort, weakness, fatigue and chest tightness.  She had given a history of worsening symptoms over the last several days, but in the last 48 hours it had gotten much worse so the patient called and asked to come in for unstable symptoms and probably requiring cardiac cath.  The patient has a known history of coronary artery disease, angina, angioplasty and stenting, GERD as well as recurrent symptoms of shortness of breath, dyspnea on exertion, leg edema.  She was recently seen about two months ago and was doing reasonably well with just minor vague complaints, but she said it accelerated in the last few weeks and presents for cardiac catheterization.   REVIEW OF SYSTEMS:  No blackout spells or syncope.  Denies nausea or vomiting.  No fever, no chills.  No sweats.  No weight loss.  No weight gain.  No hemoptysis, hematemesis.  No bright red blood per rectum.   PAST MEDICAL HISTORY:  Hypertension, tachycardia, hyperlipidemia, GERD, history of GI bleed, osteoporosis, coronary artery disease, breast cancer, angina, obesity, possible obstructive sleep apnea.   PAST SURGICAL HISTORY:  Tonsillectomy, breast reconstructive surgery, mastectomy, PCI and stent on multiple occasions, status post chemo and XRT.   FAMILY HISTORY:  Hypertension.  SOCIAL HISTORY:  Married, children, grandchildren, semiretired.  Works part-time at the hospital.  No smoking or alcohol consumption.   MEDICATIONS:   tablet effervescent, aspirin 81 mg a day, Plavix 75 mg a day, losartan 100 mg a day, Toprol-XL extended-release 24  hour succinate 200 mg a day, Lasix 20 mg a day, nitrofurantoin 100 mg once a day, K-Lor 20 mEq a day, niacin 500 a day, Pravachol 40 a day, Imdur 24 hour release 30 mg a day, omeprazole 20 mg a day, alendronate 70 mg a week, Xopenex as needed inhalers, spironolactone 25 a day, Nasonex 50 mcg once a day, singular 100 mg a day, Symbicort 80/4.5 once a day.   ALLERGIES:  None.   PHYSICAL EXAMINATION: VITAL SIGNS:  Blood pressure was 150/80, pulse of 70, respiratory rate of 16, afebrile.  HEENT:  Normocephalic, atraumatic.  Pupils equal and reactive to light.  NECK:  Supple.  No significant JVD, bruits, lymphadenopathy.  LUNGS:  Clear to auscultation and percussion without significant wheezing, rhonchi or rales.  HEART:  Regular rate and rhythm.  Positive bowel sounds.  No rebound, guarding or tenderness.  EXTREMITIES:  Within normal limits.  NEUROLOGIC:  Intact.  SKIN:  Normal.  She had trace edema.   LABORATORY DATA:  Pending.   IMPRESSION:  1.  Unstable angina.  2.  Angina.  3.  Coronary artery disease.  4.  Obesity.  5.  Hypertension.  6.  Hyperlipidemia.  7.  Edema.  8.  Known coronary artery disease.  9.  Chronic obstructive pulmonary disease.  10.  Asthma.  11.  History of breast cancer.   PLAN:  Proceed with cardiac cath today for further evaluation and management with intention to treat if she has significant obstructive disease.    ____________________________ Loran Senters. Clayborn Bigness, MD ddc:ea D: 05/22/2013 16:07:57 ET  T: 05/22/2013 18:37:08 ET JOB#: [id number]  cc: Keaghan Bowens D. Clayborn Bigness, MD, <Dictator> Yolonda Kida MD ELECTRONICALLY SIGNED 05/30/2013 22:05

## 2015-03-10 NOTE — H&P (Signed)
PATIENT NAME:  Stacie Mcdonald, Stacie Mcdonald MR#:  [id number] DATE OF BIRTH:  06/24/1946  DATE OF ADMISSION:  11/25/2011  REFERRING PHYSICIAN: Deborra Medina, MD   INDICATION: Chest pain, angina, unstable angina, poorly controlled hypertension.   HISTORY OF PRESENT ILLNESS: Stacie Mcdonald is a 69 year old white female with a known history of coronary artery disease status post angioplasty and stenting in the past, hypertension, hyperlipidemia, obesity, asthma, and breast cancer who presented with recurrent chest pain and angina, appears to be unstable in nature with worsening at rest and with exertion. She has shortness of breath. She has had more jaw pain with any activity. She has no energy. Blood pressure has been poorly controlled as well with blood pressure reading today in the office of 184/88. She has had known disease in the past with angioplasty and stenting in the past and presents with worsening symptoms that have progressed and begun to limit her activity. She has been compliant with her medications. She has had her Imdur added by her primary doctor and increased in dose to help with anginal symptoms but the symptoms have persisted and actually worse so she presented to me for further evaluation and need for possible cardiac cath and further evaluation of her hypertension.   REVIEW OF SYSTEMS: No blackout spells. No syncope. No nausea or vomiting. No fever. No chills. No sweats. No weight loss. No weight gain. No hemoptysis or hematemesis. No bright red blood per rectum. No vision change or hearing change. Denies sputum production or cough. She has had shortness of breath, angina, jaw pain, weakness, fatigue.   PAST MEDICAL HISTORY:  1. Coronary disease. 2. Hypertension. 3. Hyperlipidemia. 4. Obesity. 5. Asthma. 6. Breast cancer. 7. Gastroesophageal reflux disease.   PAST SURGICAL HISTORY:  1. Mastectomy. 2. Breast reduction. 3. Chemo. 4. XRT. 5. PCI and stent.  6. T and A.  FAMILY HISTORY:  Coronary disease, myocardial infarction, angina, chronic obstructive pulmonary disease. Mother died of a heart attack at a young age   SOCIAL HISTORY: She is married, still works as an Therapist, sports. Has children and grandchildren. No smoking or alcohol consumption.   MEDICATIONS:  1. Xopenex p.r.n.  2. Singulair 10 mg a day.  3. Alendronate 70 mg a week.  4. Plavix 75 mg a day.  5. Aspirin 81 mg a day.  6. Toprol 200 a day.  7. Imdur 60 a day.  8. Niacin 500 a day.  9. Pravachol 40 a day.  10. Losartan 100 mg a day.  11. Spironolactone 25 a day.  12. Amlodipine 5 a day.   13. Nitrofurantoin 100 mg a day.  14. Omeprazole 20 a day.   ALLERGIES: She states none.   PHYSICAL EXAMINATION:   VITAL SIGNS: In the office, the patient's blood pressure was 180/90, pulse 95, respiratory rate 16, afebrile.   HEENT: Normocephalic, atraumatic. Pupils equal, reactive to light.   NECK: Supple. No JVD, bruits, or adenopathy.   LUNGS: Clear to auscultation and percussion with mild rhonchi. No wheezing or rales. Adequate air movement.   HEART: Regular rate and rhythm, slightly tachycardic. Systolic ejection murmur left sternal border. Positive S4. No S3. PMI nondisplaced.   ABDOMEN: Benign. Positive bowel sounds. No rebound, guarding, or tenderness.   EXTREMITIES: No cyanosis, clubbing, or edema.   NEUROLOGIC: Intact.   SKIN: Normal.   LABORATORY, DIAGNOSTIC, AND RADIOLOGICAL DATA: Met B and CBC were basically unremarkable. EKG normal sinus rhythm, nonspecific ST-T wave changes.   ASSESSMENT:  1. Unstable angina.  2. Angina.  3. Coronary disease.  4. Poorly controlled hypertension. 5. Obesity. 6. Shortness of breath.  7. Hyperlipidemia.  8. Gastroesophageal reflux disease.   PLAN:  1. Proceed with direct cardiac cath to fully evaluate cardiac risk and anatomy. Will probably proceed with cath if we find significant lesion. Will proceed with intention to treat interventionally with angioplasty  and stenting. Will also investigate renal arteries because of her poorly controlled hypertension. She is on multiple antihypertensives and still is poorly controlled at this point. Evaluate for possible renal artery stenosis.  2. Recommend weight loss and exercise.  3. Continue lipid management.  4. Continue Plavix and aspirin. 5. Continue losartan. She is unable to tolerate an ACE.    6. Continue spironolactone.  7. Will base further evaluation on results of the catheter.   ____________________________ Loran Senters. Clayborn Bigness, MD ddc:drc Mcdonald: 11/25/2011 14:00:44 ET T: 11/25/2011 14:24:25 ET JOB#: [id number]  cc: Nastassia Bazaldua Mcdonald. Clayborn Bigness, MD, <Dictator> Yolonda Kida MD ELECTRONICALLY SIGNED 12/03/2011 14:17

## 2015-03-18 ENCOUNTER — Encounter: Payer: Self-pay | Admitting: Internal Medicine

## 2015-04-08 ENCOUNTER — Other Ambulatory Visit: Payer: Self-pay | Admitting: Internal Medicine

## 2015-04-08 DIAGNOSIS — Z1231 Encounter for screening mammogram for malignant neoplasm of breast: Secondary | ICD-10-CM

## 2015-04-18 ENCOUNTER — Other Ambulatory Visit: Payer: Self-pay | Admitting: *Deleted

## 2015-04-18 MED ORDER — NITROFURANTOIN MACROCRYSTAL 100 MG PO CAPS: 100.0000 mg | ORAL_CAPSULE | Freq: Every day | ORAL | Status: DC

## 2015-04-18 NOTE — Telephone Encounter (Signed)
Ok refill? Last visit 02/11/15

## 2015-04-18 NOTE — Telephone Encounter (Signed)
90 day supply authorized and sent   

## 2015-04-19 ENCOUNTER — Ambulatory Visit
Admission: RE | Admit: 2015-04-19 | Discharge: 2015-04-19 | Disposition: A | Discharge status: Home / Self Care | Payer: Medicare Other | Source: Ambulatory Visit | Attending: Internal Medicine | Admitting: Internal Medicine

## 2015-04-19 ENCOUNTER — Other Ambulatory Visit: Payer: Self-pay | Admitting: Internal Medicine

## 2015-04-19 DIAGNOSIS — Z1231 Encounter for screening mammogram for malignant neoplasm of breast: Secondary | ICD-10-CM

## 2015-04-19 DIAGNOSIS — Z9221 Personal history of antineoplastic chemotherapy: Secondary | ICD-10-CM | POA: Diagnosis not present

## 2015-04-19 DIAGNOSIS — Z853 Personal history of malignant neoplasm of breast: Secondary | ICD-10-CM | POA: Insufficient documentation

## 2015-04-19 DIAGNOSIS — Z9011 Acquired absence of right breast and nipple: Secondary | ICD-10-CM | POA: Insufficient documentation

## 2015-04-22 ENCOUNTER — Encounter: Payer: Self-pay | Admitting: *Deleted

## 2015-06-13 ENCOUNTER — Encounter: Payer: Self-pay | Admitting: Internal Medicine

## 2015-06-14 ENCOUNTER — Other Ambulatory Visit: Payer: Self-pay | Admitting: Internal Medicine

## 2015-06-14 MED ORDER — ALPRAZOLAM 0.5 MG PO TABS: 0.5000 mg | ORAL_TABLET | Freq: Every evening | ORAL | Status: DC | PRN

## 2015-06-14 NOTE — Progress Notes (Signed)
Script has been called to pharmacy as requested.

## 2015-08-13 DIAGNOSIS — R000 Tachycardia, unspecified: Secondary | ICD-10-CM | POA: Diagnosis not present

## 2015-08-13 DIAGNOSIS — I5032 Chronic diastolic (congestive) heart failure: Secondary | ICD-10-CM | POA: Diagnosis not present

## 2015-08-13 DIAGNOSIS — K219 Gastro-esophageal reflux disease without esophagitis: Secondary | ICD-10-CM | POA: Diagnosis not present

## 2015-08-13 DIAGNOSIS — Z853 Personal history of malignant neoplasm of breast: Secondary | ICD-10-CM | POA: Diagnosis not present

## 2015-08-13 DIAGNOSIS — R634 Abnormal weight loss: Secondary | ICD-10-CM | POA: Diagnosis not present

## 2015-08-13 DIAGNOSIS — E669 Obesity, unspecified: Secondary | ICD-10-CM | POA: Diagnosis not present

## 2015-08-13 DIAGNOSIS — J449 Chronic obstructive pulmonary disease, unspecified: Secondary | ICD-10-CM | POA: Diagnosis not present

## 2015-08-13 DIAGNOSIS — I209 Angina pectoris, unspecified: Secondary | ICD-10-CM | POA: Diagnosis not present

## 2015-08-13 DIAGNOSIS — I2510 Atherosclerotic heart disease of native coronary artery without angina pectoris: Secondary | ICD-10-CM | POA: Diagnosis not present

## 2015-09-09 ENCOUNTER — Ambulatory Visit (INDEPENDENT_AMBULATORY_CARE_PROVIDER_SITE_OTHER): Payer: Medicare Other | Admitting: Nurse Practitioner

## 2015-09-09 VITALS — BP 118/66 | HR 105 | Temp 100.1°F | Resp 12 | Ht 62.75 in | Wt 178.6 lb

## 2015-09-09 DIAGNOSIS — T360X5A Adverse effect of penicillins, initial encounter: Secondary | ICD-10-CM | POA: Diagnosis not present

## 2015-09-09 DIAGNOSIS — K047 Periapical abscess without sinus: Secondary | ICD-10-CM | POA: Diagnosis not present

## 2015-09-09 MED ORDER — ALPRAZOLAM 0.5 MG PO TABS: 0.5000 mg | ORAL_TABLET | Freq: Every evening | ORAL | Status: DC | PRN

## 2015-09-09 MED ORDER — CLINDAMYCIN HCL 150 MG PO CAPS: 150.0000 mg | ORAL_CAPSULE | Freq: Three times a day (TID) | ORAL | Status: DC

## 2015-09-09 MED ORDER — METHYLPREDNISOLONE 4 MG PO TABS: ORAL_TABLET | ORAL | Status: DC

## 2015-09-09 NOTE — Progress Notes (Signed)
Pre visit review using our clinic review tool, if applicable. No additional management support is needed unless otherwise documented below in the visit note. 

## 2015-09-09 NOTE — Patient Instructions (Signed)
Call us at the first sign of diarrhea!   Please take a probiotic ( Align, Floraque or Culturelle) while you are on the antibiotic to prevent a serious antibiotic associated diarrhea  Called clostirudium dificile colitis and a vaginal yeast infection.   Clindamycin take 3 tablets 3 x a day for 7 days.   Prednisone with breakfast or lunch at the latest.  6 tablets on day 1, 5 tablets on day 2, 4 tablets on day 3, 3 tablets on day 4, 2 tablets day 5, 1 tablet on day 6...done! Take tablets all together not spaced out Don't take with NSAIDs (Ibuprofen, Aleve, Naproxen, Meloxicam ect...)  Call us if anything new happens or changes!

## 2015-09-09 NOTE — Progress Notes (Signed)
Patient ID: Stacie Mcdonald, female    DOB: May 18, 1946  Age: 69 y.o. MRN: [id number]  CC: Rash   HPI Stacie Mcdonald presents for CC of rash after taking pcn x 2 days.   1) Pt had dental work last week and was taking penicillin for an abscess from a root canal. She reports after stopping the medication she broke out into urticaria all over (neck down). Very pruritic. She reports the dentist told her to see [place] and he would suggest another medication if we needed. She is taking benadryl with little relief. Denies trouble breathing, speaking, or swallowing.    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   She has past surgical history that includes Breast surgery and Coronary angioplasty with stent (Jan 2013).   Her family history includes Cancer in her father and maternal aunt; Cancer (age of onset: 45) in her sister; Heart disease (age of onset: 72) in her father; Heart disease (age of onset: 30) in her mother.She reports that she has never smoked. She has never used smokeless tobacco. She reports that she does not drink alcohol or use illicit drugs.  Outpatient Prescriptions Prior to Visit  Medication Sig Dispense Refill  . alendronate (FOSAMAX) 70 MG tablet Take 1 tablet (70 mg total) by mouth every 7 (seven) days. Take with a full glass of water on an empty stomach. 12 tablet 3  . aspirin 81 MG tablet Take 160 mg by mouth daily.      . clopidogrel (PLAVIX) 75 MG tablet TAKE ONE TABLET BY MOUTH DAILY 90 tablet 1  . furosemide (LASIX) 20 MG tablet Take 1 tablet (20 mg total) by mouth daily. (Patient taking differently: Take 40 mg by mouth daily. ) 30 tablet 3  . isosorbide mononitrate (IMDUR) 30  MG 24 hr tablet Take 2 tablets (60 mg total) by mouth daily. 30 tablet 11  . losartan (COZAAR) 100 MG tablet TAKE 1 TABLET (100 MG TOTAL) BY MOUTH DAILY. 90 tablet 1  . metoprolol (TOPROL-XL) 200 MG 24 hr tablet TAKE 1 TABLET BY MOUTH DAILY. 90 tablet 1  . niacin 500 MG tablet Take 1 tablet (500 mg total) by mouth daily. 90 tablet 1  . nitrofurantoin (MACRODANTIN) 100 MG capsule Take 1 capsule (100 mg total) by mouth daily. 90 capsule 1  . omeprazole (PRILOSEC) 20 MG capsule Take 1 capsule (20 mg total) by mouth daily. 90 capsule 1  . ONE TOUCH ULTRA TEST test strip CHECK DAILY 100 each 2  . ONETOUCH DELICA LANCETS 08M MISC CHECK DAILY 100 each 2  . potassium chloride SA (K-DUR,KLOR-CON) 20 MEQ tablet Take 1 tablet (20 mEq total) by mouth daily. 30 tablet 3  . ALPRAZolam (XANAX) 0.5 MG tablet Take 1 tablet (0.5 mg total) by mouth at bedtime as needed for anxiety. 30 tablet 0   No facility-administered medications prior to visit.    ROS Review of Systems  Constitutional: Negative for fever, chills, diaphoresis, fatigue and unexpected weight change.  HENT: Negative for tinnitus and trouble swallowing.   Eyes: Negative for visual disturbance.  Respiratory: Negative for cough, chest tightness, shortness of breath and wheezing.   Cardiovascular: Negative for chest pain, palpitations and leg swelling.  Gastrointestinal: Negative for nausea, vomiting, abdominal pain and  diarrhea.  Genitourinary: Negative for dysuria, hematuria, vaginal discharge and vaginal pain.  Musculoskeletal: Negative for myalgias, back pain, arthralgias and gait problem.  Skin: Positive for rash. Negative for color change.  Neurological: Negative for dizziness, weakness, numbness and headaches.  Psychiatric/Behavioral: Positive for sleep disturbance. Negative for suicidal ideas. The patient is not nervous/anxious.        From pruritis    Objective:  BP 118/66 mmHg  Pulse 105  Temp(Src) 100.1 F (37.8 C)  Resp 12   Ht 5' 2.75" (1.594 m)  Wt 178 lb 9.6 oz (81.012 kg)  BMI 31.88 kg/m2  SpO2 94%  Physical Exam  Constitutional: She is oriented to person, place, and time. She appears well-developed and well-nourished. No distress.  HENT:  Head: Normocephalic and atraumatic.  Mouth/Throat: Oropharynx is clear and moist.  Tongue appears normal limits  Neurological: She is alert and oriented to person, place, and time. No cranial nerve deficit. Coordination normal.  Skin: Rash noted. Rash is urticarial. She is not diaphoretic.   Assessment & Plan:   Stacie Mcdonald was seen today for rash.  Diagnoses and all orders for this visit:  Allergic reaction to penicillin, initial encounter  Abscessed tooth  Other orders -     clindamycin (CLEOCIN) 150 MG capsule; Take 1 capsule (150 mg total) by mouth 3 (three) times daily. -     methylPREDNISolone (MEDROL) 4 MG tablet; Take 6 tablets by mouth with breakfast or lunch and decrease by 1 tablet each day until gone. -     ALPRAZolam (XANAX) 0.5 MG tablet; Take 1 tablet (0.5 mg total) by mouth at bedtime as needed for anxiety.  I am having Stacie Mcdonald start on clindamycin and methylPREDNISolone. I am also having her maintain her aspirin, isosorbide mononitrate, clopidogrel, furosemide, potassium chloride SA, ONE TOUCH ULTRA TEST, ONETOUCH DELICA LANCETS 80X, losartan, niacin, alendronate, metoprolol, omeprazole, nitrofurantoin, and ALPRAZolam.  Meds ordered this encounter  Medications  . clindamycin (CLEOCIN) 150 MG capsule    Sig: Take 1 capsule (150 mg total) by mouth 3 (three) times daily.    Dispense:  63 capsule    Refill:  0    Order Specific Question:  Supervising Provider    Answer:  Derrel Nip, TERESA L [[id number]]  . methylPREDNISolone (MEDROL) 4 MG tablet    Sig: Take 6 tablets by mouth with breakfast or lunch and decrease by 1 tablet each day until gone.    Dispense:  21 tablet    Refill:  0    Order Specific Question:  Supervising Provider    Answer:  Deborra Medina L [[id number]]  . ALPRAZolam (XANAX) 0.5 MG tablet    Sig: Take 1 tablet (0.5 mg total) by mouth at bedtime as needed for anxiety.    Dispense:  30 tablet    Refill:  0    Order Specific Question:  Supervising Provider    Answer:  Crecencio Mc [[id number]]     Follow-up: Return if symptoms worsen or fail to improve.

## 2015-09-18 ENCOUNTER — Encounter: Payer: Self-pay | Admitting: Nurse Practitioner

## 2015-09-18 DIAGNOSIS — K047 Periapical abscess without sinus: Secondary | ICD-10-CM | POA: Insufficient documentation

## 2015-09-18 DIAGNOSIS — T360X5A Adverse effect of penicillins, initial encounter: Secondary | ICD-10-CM | POA: Insufficient documentation

## 2015-09-18 NOTE — Assessment & Plan Note (Addendum)
Area of gum is still purulent. Will switch to Clindamycin 3 capsules 3 x daily (instructions were incorrect and clarified with pharmacist). STRONGLY encouraged probiotics and asked pt to inform us if diarrhea occurs. Will follow as needed.

## 2015-09-18 NOTE — Assessment & Plan Note (Signed)
Pt has had penicillin with success previously. Pt has hives allover. Added pcn to allergy list, pt is well appearing and seems to be stable. She is having trouble sleeping from pruritis. Will start on Prednisone and asked her to take benadryl at night. Will follow if changes to symptoms occur or worsening. Asked her to call 911 if trouble with breathing, speaking (someone call for her), or tongue/lip swelling.

## 2015-10-04 ENCOUNTER — Telehealth: Payer: Self-pay

## 2015-10-04 MED ORDER — NITROFURANTOIN MACROCRYSTAL 100 MG PO CAPS: 100.0000 mg | ORAL_CAPSULE | Freq: Every day | ORAL | Status: DC

## 2015-10-04 NOTE — Telephone Encounter (Signed)
Pt is requesting a refill on Nitrofurantoin. Are you ok with this?

## 2015-10-04 NOTE — Telephone Encounter (Signed)
90 day supply authorized and sent   

## 2015-10-07 DIAGNOSIS — L853 Xerosis cutis: Secondary | ICD-10-CM | POA: Diagnosis not present

## 2015-10-07 DIAGNOSIS — I8393 Asymptomatic varicose veins of bilateral lower extremities: Secondary | ICD-10-CM | POA: Diagnosis not present

## 2015-10-07 DIAGNOSIS — R21 Rash and other nonspecific skin eruption: Secondary | ICD-10-CM | POA: Diagnosis not present

## 2015-10-07 DIAGNOSIS — L821 Other seborrheic keratosis: Secondary | ICD-10-CM | POA: Diagnosis not present

## 2015-10-07 DIAGNOSIS — Z1283 Encounter for screening for malignant neoplasm of skin: Secondary | ICD-10-CM | POA: Diagnosis not present

## 2015-10-07 DIAGNOSIS — L820 Inflamed seborrheic keratosis: Secondary | ICD-10-CM | POA: Diagnosis not present

## 2016-02-20 DIAGNOSIS — E669 Obesity, unspecified: Secondary | ICD-10-CM | POA: Diagnosis not present

## 2016-02-20 DIAGNOSIS — J449 Chronic obstructive pulmonary disease, unspecified: Secondary | ICD-10-CM | POA: Diagnosis not present

## 2016-02-20 DIAGNOSIS — R000 Tachycardia, unspecified: Secondary | ICD-10-CM | POA: Diagnosis not present

## 2016-02-20 DIAGNOSIS — K219 Gastro-esophageal reflux disease without esophagitis: Secondary | ICD-10-CM | POA: Diagnosis not present

## 2016-02-20 DIAGNOSIS — I5032 Chronic diastolic (congestive) heart failure: Secondary | ICD-10-CM | POA: Diagnosis not present

## 2016-02-20 DIAGNOSIS — I209 Angina pectoris, unspecified: Secondary | ICD-10-CM | POA: Diagnosis not present

## 2016-02-20 DIAGNOSIS — R0602 Shortness of breath: Secondary | ICD-10-CM | POA: Diagnosis not present

## 2016-02-20 DIAGNOSIS — E785 Hyperlipidemia, unspecified: Secondary | ICD-10-CM | POA: Diagnosis not present

## 2016-02-20 DIAGNOSIS — I2510 Atherosclerotic heart disease of native coronary artery without angina pectoris: Secondary | ICD-10-CM | POA: Diagnosis not present

## 2016-03-06 DIAGNOSIS — R0602 Shortness of breath: Secondary | ICD-10-CM | POA: Diagnosis not present

## 2016-03-06 DIAGNOSIS — R0609 Other forms of dyspnea: Secondary | ICD-10-CM | POA: Diagnosis not present

## 2016-03-06 DIAGNOSIS — I209 Angina pectoris, unspecified: Secondary | ICD-10-CM | POA: Diagnosis not present

## 2016-03-10 DIAGNOSIS — I2510 Atherosclerotic heart disease of native coronary artery without angina pectoris: Secondary | ICD-10-CM | POA: Diagnosis not present

## 2016-03-10 DIAGNOSIS — R0602 Shortness of breath: Secondary | ICD-10-CM | POA: Diagnosis not present

## 2016-03-12 DIAGNOSIS — R0602 Shortness of breath: Secondary | ICD-10-CM | POA: Diagnosis not present

## 2016-03-16 DIAGNOSIS — R634 Abnormal weight loss: Secondary | ICD-10-CM | POA: Diagnosis not present

## 2016-03-16 DIAGNOSIS — J449 Chronic obstructive pulmonary disease, unspecified: Secondary | ICD-10-CM | POA: Diagnosis not present

## 2016-03-16 DIAGNOSIS — I5032 Chronic diastolic (congestive) heart failure: Secondary | ICD-10-CM | POA: Diagnosis not present

## 2016-03-16 DIAGNOSIS — Z853 Personal history of malignant neoplasm of breast: Secondary | ICD-10-CM | POA: Diagnosis not present

## 2016-03-16 DIAGNOSIS — E785 Hyperlipidemia, unspecified: Secondary | ICD-10-CM | POA: Diagnosis not present

## 2016-03-16 DIAGNOSIS — K219 Gastro-esophageal reflux disease without esophagitis: Secondary | ICD-10-CM | POA: Diagnosis not present

## 2016-03-16 DIAGNOSIS — R0602 Shortness of breath: Secondary | ICD-10-CM | POA: Diagnosis not present

## 2016-03-16 DIAGNOSIS — R000 Tachycardia, unspecified: Secondary | ICD-10-CM | POA: Diagnosis not present

## 2016-03-16 DIAGNOSIS — I2510 Atherosclerotic heart disease of native coronary artery without angina pectoris: Secondary | ICD-10-CM | POA: Diagnosis not present

## 2016-03-16 DIAGNOSIS — I5042 Chronic combined systolic (congestive) and diastolic (congestive) heart failure: Secondary | ICD-10-CM | POA: Diagnosis not present

## 2016-03-16 DIAGNOSIS — E669 Obesity, unspecified: Secondary | ICD-10-CM | POA: Diagnosis not present

## 2016-03-16 DIAGNOSIS — I209 Angina pectoris, unspecified: Secondary | ICD-10-CM | POA: Diagnosis not present

## 2016-03-31 DIAGNOSIS — R0609 Other forms of dyspnea: Secondary | ICD-10-CM | POA: Diagnosis not present

## 2016-03-31 DIAGNOSIS — J449 Chronic obstructive pulmonary disease, unspecified: Secondary | ICD-10-CM | POA: Diagnosis not present

## 2016-03-31 DIAGNOSIS — R05 Cough: Secondary | ICD-10-CM | POA: Diagnosis not present

## 2016-04-15 ENCOUNTER — Encounter: Payer: Self-pay | Admitting: Nurse Practitioner

## 2016-04-16 MED ORDER — ALPRAZOLAM 0.5 MG PO TABS: 0.5000 mg | ORAL_TABLET | Freq: Every evening | ORAL | Status: DC | PRN

## 2016-04-16 NOTE — Telephone Encounter (Signed)
Ok to call in alprazolam for her [place] flight,  without appt.  I know patient well.

## 2016-04-17 ENCOUNTER — Other Ambulatory Visit: Payer: Self-pay

## 2016-05-06 ENCOUNTER — Encounter: Payer: Medicare Other | Admitting: Internal Medicine

## 2016-05-27 DIAGNOSIS — H25813 Combined forms of age-related cataract, bilateral: Secondary | ICD-10-CM | POA: Diagnosis not present

## 2016-06-08 ENCOUNTER — Ambulatory Visit (INDEPENDENT_AMBULATORY_CARE_PROVIDER_SITE_OTHER): Payer: Medicare Other | Admitting: Internal Medicine

## 2016-06-08 ENCOUNTER — Encounter: Payer: Self-pay | Admitting: Internal Medicine

## 2016-06-08 VITALS — BP 122/62 | HR 88 | Temp 98.8°F | Ht 63.0 in | Wt 177.0 lb

## 2016-06-08 DIAGNOSIS — R0689 Other abnormalities of breathing: Secondary | ICD-10-CM

## 2016-06-08 DIAGNOSIS — M8580 Other specified disorders of bone density and structure, unspecified site: Secondary | ICD-10-CM

## 2016-06-08 DIAGNOSIS — M899 Disorder of bone, unspecified: Secondary | ICD-10-CM

## 2016-06-08 DIAGNOSIS — R0600 Dyspnea, unspecified: Secondary | ICD-10-CM

## 2016-06-08 DIAGNOSIS — E1169 Type 2 diabetes mellitus with other specified complication: Secondary | ICD-10-CM

## 2016-06-08 DIAGNOSIS — Z79899 Other long term (current) drug therapy: Secondary | ICD-10-CM

## 2016-06-08 DIAGNOSIS — E119 Type 2 diabetes mellitus without complications: Secondary | ICD-10-CM | POA: Diagnosis not present

## 2016-06-08 DIAGNOSIS — Z853 Personal history of malignant neoplasm of breast: Secondary | ICD-10-CM

## 2016-06-08 DIAGNOSIS — E785 Hyperlipidemia, unspecified: Secondary | ICD-10-CM

## 2016-06-08 DIAGNOSIS — Z0000 Encounter for general adult medical examination without abnormal findings: Secondary | ICD-10-CM | POA: Diagnosis not present

## 2016-06-08 DIAGNOSIS — I10 Essential (primary) hypertension: Secondary | ICD-10-CM

## 2016-06-08 NOTE — Progress Notes (Signed)
Patient ID: Stacie Mcdonald, female    DOB: 1945/12/16  Age: 70 y.o. MRN: [id number]  The patient is here for annual Medicare wellness examination but has not been seen for diabetes follow up since March 2016.   History of breast cancer 1991 s/p r breast mastectomy, chemo and XRT all at [facility].  Has  Been receiving unilateral left mammograms annually in Jan and is overdue for  left breast mammogram No longer followed at [facility] 2013 5 yr follow up due due to Stacie Mcdonald in 2 aunts Osteopenia . Stopped alendronate  Last DEXA 2014 No falls   The risk factors are reflected in the social history.  The roster of all physicians providing medical care to patient - is listed in the Snapshot section of the chart.  Activities of daily living:  The patient is 100% independent in all ADLs: dressing, toileting, feeding as well as independent mobility  Home safety : The patient has smoke detectors in the home. They wear seatbelts.  There are no firearms at home. There is no violence in the home.   There is no risks for hepatitis, STDs or HIV. There is no   history of blood transfusion. They have no travel history to infectious disease endemic areas of the world.  The patient has seen their dentist in the last six month. They have seen their eye doctor in the last year. They admit to slight hearing difficulty with regard to whispered voices and some television programs.  They have deferred audiologic testing in the last year.  They do not  have excessive sun exposure. Discussed the need for sun protection: hats, long sleeves and use of sunscreen if there is significant sun exposure.   Diet: the importance of a healthy diet is discussed. They do have a healthy diet..  Has reduced carbs IN DIET y cutting out bread  The benefits of regular aerobic exercise were discussed. She goes to an exercise class at 6 am every morning using dancing,   weights and bands.  At the [facility] in [place],    Depression screen: there  are no signs or vegative symptoms of depression- irritability, change in appetite, anhedonia, sadness/tearfullness.  Cognitive assessment: the patient manages all their financial and personal affairs and is actively engaged. They could relate day,date,year and events; recalled 2/3 objects at 3 minutes; performed clock-face test normally.  The following portions of the patient's history were reviewed and updated as appropriate: allergies, current medications, past family history, past medical history,  past surgical history, past social history  and problem list.  Visual acuity was not assessed per patient preference since she has regular follow up with her ophthalmologist. Hearing and body mass index were assessed and reviewed.   During the course of the visit the patient was educated and counseled about appropriate screening and preventive services including : fall prevention , diabetes screening, nutrition counseling, colorectal cancer screening, and recommended immunizations.    CC: The primary encounter diagnosis was Type 2 diabetes, HbA1c goal < 7% (Stacie Mcdonald). Diagnoses of History of breast cancer, Long-term use of high-risk medication, Hyperlipidemia due to type 2 diabetes mellitus (Stacie Mcdonald), Essential hypertension, Diabetes mellitus without complication (Stacie Mcdonald), Personal history of breast cancer, Dyspnea and respiratory abnormality, Medicare annual wellness visit, subsequent, and Osteopenia due to cancer therapy were also pertinent to this visit. Breathing improved.   7 lb wt loss since last visit with me    DM: lost to follow up.  Wants to return for  fasting labs despite being very overdue DM a1c 6.7 last year.  .  Checks BS rarely   Readings have been 120 fasting , rarely below 80 , and post  prandials reportedly 140 or lower.  Annual Eye exam last week early cataracts  .   foot exam normal   Saw Pulmonology and Cardiology in May for shortness of breath.  Attributed to effects of  adriamycin and  macrobid on heart and lungs   Stress test was borderline abnormal during exercise myoview (positive EKG changes, no ischemia, end EF 45-50%) medical therapy advised  History Stacie Mcdonald has a past medical history of Allergy; Arthritis; Asthma; Breast Cancer (1991); CAD (coronary artery disease) (2008); Chicken pox; Colon polyps (April 2009); GERD (gastroesophageal reflux disease); Heart disease; Hyperlipidemia; Hypertension; Osteopenia due to cancer therapy (Jan 2011); Rheumatic fever; and Urinary tract infection.   She has a past surgical history that includes Coronary angioplasty with stent (Jan 2013); Breast surgery; Breast biopsy; and Tonsillectomy.   Her family history includes Cancer in her father and maternal aunt; Diabetes in her sister; Heart disease (age of onset: 37) in her father; Heart disease (age of onset: 77) in her mother.She reports that she has never smoked. She has never used smokeless tobacco. She reports that she drinks alcohol. She reports that she does not use drugs.  Outpatient Medications Prior to Visit  Medication Sig Dispense Refill  . aspirin 81 MG tablet Take 160 mg by mouth daily.      . clopidogrel (PLAVIX) 75 MG tablet TAKE ONE TABLET BY MOUTH DAILY 90 tablet 1  . furosemide (LASIX) 20 MG tablet Take 1 tablet (20 mg total) by mouth daily. (Patient taking differently: Take 40 mg by mouth daily. ) 30 tablet 3  . isosorbide mononitrate (IMDUR) 30 MG 24 hr tablet Take 2 tablets (60 mg total) by mouth daily. 30 tablet 11  . losartan (COZAAR) 100 MG tablet TAKE 1 TABLET (100 MG TOTAL) BY MOUTH DAILY. 90 tablet 1  . metoprolol (TOPROL-XL) 200 MG 24 hr tablet TAKE 1 TABLET BY MOUTH DAILY. 90 tablet 1  . niacin 500 MG tablet Take 1 tablet (500 mg total) by mouth daily. 90 tablet 1  . nitrofurantoin (MACRODANTIN) 100 MG capsule Take 1 capsule (100 mg total) by mouth daily. 90 capsule 1  . omeprazole (PRILOSEC) 20 MG capsule Take 1 capsule (20 mg total) by mouth daily. 90 capsule  1  . ONE TOUCH ULTRA TEST test strip CHECK DAILY 100 each 2  . ONETOUCH DELICA LANCETS 99991111 MISC CHECK DAILY 100 each 2  . potassium chloride SA (K-DUR,KLOR-CON) 20 MEQ tablet Take 1 tablet (20 mEq total) by mouth daily. 30 tablet 3  . alendronate (FOSAMAX) 70 MG tablet Take 1 tablet (70 mg total) by mouth every 7 (seven) days. Take with a full glass of water on an empty stomach. (Patient not taking: Reported on 06/08/2016) 12 tablet 3  . ALPRAZolam (XANAX) 0.5 MG tablet Take 1 tablet (0.5 mg total) by mouth at bedtime as needed for anxiety. (Patient not taking: Reported on 06/08/2016) 30 tablet 0  . clindamycin (CLEOCIN) 150 MG capsule Take 1 capsule (150 mg total) by mouth 3 (three) times daily. 63 capsule 0  . methylPREDNISolone (MEDROL) 4 MG tablet Take 6 tablets by mouth with breakfast or lunch and decrease by 1 tablet each day until gone. 21 tablet 0   No facility-administered medications prior to visit.     Review of Systems   Patient  denies headache, fevers, malaise, unintentional weight loss, skin rash, eye pain, sinus congestion and sinus pain, sore throat, dysphagia,  hemoptysis , cough, dyspnea, wheezing, chest pain, palpitations, orthopnea, edema, abdominal pain, nausea, melena, diarrhea, constipation, flank pain, dysuria, hematuria, urinary  Frequency, nocturia, numbness, tingling, seizures,  Focal weakness, Loss of consciousness,  Tremor, insomnia, depression, anxiety, and suicidal ideation.      Objective:  BP 122/62 (BP Location: Left Arm, Patient Position: Sitting, Cuff Size: Large)   Pulse 88   Temp 98.8 F (37.1 C) (Oral)   Ht 5\' 3"  (1.6 m)   Wt 177 lb (80.3 kg)   BMI 31.35 kg/m   Physical Exam  General appearance: alert, cooperative and appears stated age Ears: normal TM's and external ear canals both ears Throat: lips, mucosa, and tongue normal; teeth and gums normal Neck: no adenopathy, no carotid bruit, supple, symmetrical, trachea midline and thyroid not  enlarged, symmetric, no tenderness/mass/nodules Back: symmetric, no curvature. ROM normal. No CVA tenderness. Lungs: clear to auscultation bilaterally Heart: regular rate and rhythm, S1, S2 normal, no murmur, click, rub or gallop Abdomen: soft, non-tender; bowel sounds normal; no masses,  no organomegaly Pulses: 2+ and symmetric Skin: Skin color, texture, turgor normal. No rashes or lesions Lymph nodes: Cervical, supraclavicular, and axillary nodes normal. Foot exam:  Nails are well trimmed,  No callouses,  Sensation intact to microfilament   Assessment & Plan:   Problem List Items Addressed This Visit    Personal history of breast cancer    contineu annual screening with left unilateral mammogram, ordered today       Osteopenia due to cancer therapy    T score -2.2 in 2014.  Will repeat this year and treat if osteoporosis is present.      Relevant Orders   DG Bone Density   Essential hypertension    Well controlled on current regimen. Renal function is overdue , no changes today.      Relevant Medications   digoxin (LANOXIN) 0.125 MG tablet   Medicare annual wellness visit, subsequent    Annual Medicare wellness  exam was done  and management of acute and chronic conditions .  During the course of the visit the patient was educated and counseled about appropriate screening and preventive services including : fall prevention , diabetes screening, nutrition counseling, colorectal cancer screening, and recommended immunizations.  Printed recommendations for health maintenance screenings was given.        Dyspnea and respiratory abnormality    Secondary to ischemic cardiomyopathy with possible contribution from prior therapy with adriamycin.  Not limiting her activity,  Medical management advised by cardiology      Diabetes mellitus without complication ([place])    Lost to follow up for over a year.  On carb modified diet. And losing weight . She will return ofr a1 c, lipids and  UA  Lab Results  Component Value Date   HGBA1C 6.7 (H) 02/15/2015   Lab Results  Component Value Date   CHOL 198 02/15/2015   HDL 39.60 02/15/2015   LDLCALC 125 (H) 02/15/2015   TRIG 168.0 (H) 02/15/2015   CHOLHDL 5 02/15/2015   Lab Results  Component Value Date   MICROALBUR <0.7 02/15/2015           Other Visit Diagnoses    Type 2 diabetes, HbA1c goal < 7% (HCC)    -  Primary   Relevant Orders   Comprehensive metabolic panel   Hemoglobin A1c   Lipid panel  Microalbumin / creatinine urine ratio   History of breast cancer       Relevant Orders   MM Digital Screening Unilat L   Long-term use of high-risk medication       Relevant Orders   CBC with Differential/Platelet   Vitamin B12   Hyperlipidemia due to type 2 diabetes mellitus ([place])       Relevant Medications   digoxin (LANOXIN) 0.125 MG tablet   Other Relevant Orders   LDL cholesterol, direct      I have discontinued Ms. Ranshaw alendronate, clindamycin, methylPREDNISolone, and ALPRAZolam. I am also having her maintain her aspirin, isosorbide mononitrate, clopidogrel, furosemide, potassium chloride SA, ONE TOUCH ULTRA TEST, ONETOUCH DELICA LANCETS 99991111, losartan, niacin, metoprolol, omeprazole, nitrofurantoin, and digoxin.  Meds ordered this encounter  Medications  . digoxin (LANOXIN) 0.125 MG tablet    Sig: Take 0.125 mg by mouth daily.    Medications Discontinued During This Encounter  Medication Reason  . clindamycin (CLEOCIN) 150 MG capsule Completed Course  . methylPREDNISolone (MEDROL) 4 MG tablet Completed Course  . alendronate (FOSAMAX) 70 MG tablet   . ALPRAZolam (XANAX) 0.5 MG tablet     Follow-up: Return in about 3 months (around 09/08/2016) for FASTING LABS ASAP ,  3 MONTH OV .   Crecencio Mc, MD

## 2016-06-08 NOTE — Patient Instructions (Addendum)
Congrats!!! On the weight loss. Your goal is 168 lbs to get BMI < 30   Please return ASAP for fasting labs  I will order your mammogram   You might want to try a premixed protein drink called Premier Protein shake in the morning.  It is less $$$ and very low sugar.  160 cal  30 g protein  1 g sugar 50% calcium needs  (available at [employer], [employer]  Etc)   Also try these  low carb breads for daily use  Joseph's Pita breads and lavash breads Flat Out  Sandwich Thins (Arnold) Toufayan wraps [employer] makes a whole wheat tortilla that is 6 net carbs     To make a low carb chip :  Take the Joseph's Lavash or Pita bread,  Or the [employer] Low carb whole wheat tortilla   Place on metal cookie sheet  Brush with olive oil  Sprinkle garlic powder (NOT garlic salt), grated parmesan cheese, mediterranean seasoning , or all of them?  Bake at 300  or 30 minutes         Menopause is a normal process in which your reproductive ability comes to an end. This process happens gradually over a span of months to years, usually between the ages of 65 and 65. Menopause is complete when you have missed 12 consecutive menstrual periods. It is important to talk with your health care provider about some of the most common conditions that affect postmenopausal women, such as heart disease, cancer, and bone loss (osteoporosis). Adopting a healthy lifestyle and getting preventive care can help to promote your health and wellness. Those actions can also lower your chances of developing some of these common conditions. WHAT SHOULD I KNOW ABOUT MENOPAUSE? During menopause, you may experience a number of symptoms, such as:  Moderate-to-severe hot flashes.  Night sweats.  Decrease in sex drive.  Mood swings.  Headaches.  Tiredness.  Irritability.  Memory problems.  Insomnia. Choosing to treat or not to treat menopausal changes is an individual decision that you make with your health care  provider. WHAT SHOULD I KNOW ABOUT HORMONE REPLACEMENT THERAPY AND SUPPLEMENTS? Hormone therapy products are effective for treating symptoms that are associated with menopause, such as hot flashes and night sweats. Hormone replacement carries certain risks, especially as you become older. If you are thinking about using estrogen or estrogen with progestin treatments, discuss the benefits and risks with your health care provider. WHAT SHOULD I KNOW ABOUT HEART DISEASE AND STROKE? Heart disease, heart attack, and stroke become more likely as you age. This may be due, in part, to the hormonal changes that your body experiences during menopause. These can affect how your body processes dietary fats, triglycerides, and cholesterol. Heart attack and stroke are both medical emergencies. There are many things that you can do to help prevent heart disease and stroke:  Have your blood pressure checked at least every 1-2 years. High blood pressure causes heart disease and increases the risk of stroke.  If you are 69-52 years old, ask your health care provider if you should take aspirin to prevent a heart attack or a stroke.  Do not use any tobacco products, including cigarettes, chewing tobacco, or electronic cigarettes. If you need help quitting, ask your health care provider.  It is important to eat a healthy diet and maintain a healthy weight.  Be sure to include plenty of vegetables, fruits, low-fat dairy products, and lean protein.  Avoid eating foods that are high  in solid fats, added sugars, or salt (sodium).  Get regular exercise. This is one of the most important things that you can do for your health.  Try to exercise for at least 150 minutes each week. The type of exercise that you do should increase your heart rate and make you sweat. This is known as moderate-intensity exercise.  Try to do strengthening exercises at least twice each week. Do these in addition to the moderate-intensity  exercise.  Know your numbers.Ask your health care provider to check your cholesterol and your blood glucose. Continue to have your blood tested as directed by your health care provider. WHAT SHOULD I KNOW ABOUT CANCER SCREENING? There are several types of cancer. Take the following steps to reduce your risk and to catch any cancer development as early as possible. Breast Cancer  Practice breast self-awareness.  This means understanding how your breasts normally appear and feel.  It also means doing regular breast self-exams. Let your health care provider know about any changes, no matter how small.  If you are 69 or older, have a clinician do a breast exam (clinical breast exam or CBE) every year. Depending on your age, family history, and medical history, it may be recommended that you also have a yearly breast X-ray (mammogram).  If you have a family history of breast cancer, talk with your health care provider about genetic screening.  If you are at high risk for breast cancer, talk with your health care provider about having an MRI and a mammogram every year.  Breast cancer (BRCA) gene test is recommended for women who have family members with BRCA-related cancers. Results of the assessment will determine the need for genetic counseling and BRCA1 and for BRCA2 testing. BRCA-related cancers include these types:  Breast. This occurs in males or females.  Ovarian.  Tubal. This may also be called fallopian tube cancer.  Cancer of the abdominal or pelvic lining (peritoneal cancer).  Prostate.  Pancreatic. Cervical, Uterine, and Ovarian Cancer Your health care provider may recommend that you be screened regularly for cancer of the pelvic organs. These include your ovaries, uterus, and vagina. This screening involves a pelvic exam, which includes checking for microscopic changes to the surface of your cervix (Pap test).  For women ages 21-65, health care providers may recommend a  pelvic exam and a Pap test every three years. For women ages 26-65, they may recommend the Pap test and pelvic exam, combined with testing for human papilloma virus (HPV), every five years. Some types of HPV increase your risk of cervical cancer. Testing for HPV may also be done on women of any age who have unclear Pap test results.  Other health care providers may not recommend any screening for nonpregnant women who are considered low risk for pelvic cancer and have no symptoms. Ask your health care provider if a screening pelvic exam is right for you.  If you have had past treatment for cervical cancer or a condition that could lead to cancer, you need Pap tests and screening for cancer for at least 20 years after your treatment. If Pap tests have been discontinued for you, your risk factors (such as having a new sexual partner) need to be reassessed to determine if you should start having screenings again. Some women have medical problems that increase the chance of getting cervical cancer. In these cases, your health care provider may recommend that you have screening and Pap tests more often.  If you have a  family history of uterine cancer or ovarian cancer, talk with your health care provider about genetic screening.  If you have vaginal bleeding after reaching menopause, tell your health care provider.  There are currently no reliable tests available to screen for ovarian cancer. Lung Cancer Lung cancer screening is recommended for adults 56-59 years old who are at high risk for lung cancer because of a history of smoking. A yearly low-dose CT scan of the lungs is recommended if you:  Currently smoke.  Have a history of at least 30 pack-years of smoking and you currently smoke or have quit within the past 15 years. A pack-year is smoking an average of one pack of cigarettes per day for one year. Yearly screening should:  Continue until it has been 15 years since you quit.  Stop if you  develop a health problem that would prevent you from having lung cancer treatment. Colorectal Cancer  This type of cancer can be detected and can often be prevented.  Routine colorectal cancer screening usually begins at age 63 and continues through age 40.  If you have risk factors for colon cancer, your health care provider may recommend that you be screened at an earlier age.  If you have a family history of colorectal cancer, talk with your health care provider about genetic screening.  Your health care provider may also recommend using home test kits to check for hidden blood in your stool.  A small camera at the end of a tube can be used to examine your colon directly (sigmoidoscopy or colonoscopy). This is done to check for the earliest forms of colorectal cancer.  Direct examination of the colon should be repeated every 5-10 years until age 54. However, if early forms of precancerous polyps or small growths are found or if you have a family history or genetic risk for colorectal cancer, you may need to be screened more often. Skin Cancer  Check your skin from head to toe regularly.  Monitor any moles. Be sure to tell your health care provider:  About any new moles or changes in moles, especially if there is a change in a mole's shape or color.  If you have a mole that is larger than the size of a pencil eraser.  If any of your family members has a history of skin cancer, especially at a young age, talk with your health care provider about genetic screening.  Always use sunscreen. Apply sunscreen liberally and repeatedly throughout the day.  Whenever you are outside, protect yourself by wearing long sleeves, pants, a wide-brimmed hat, and sunglasses. WHAT SHOULD I KNOW ABOUT OSTEOPOROSIS? Osteoporosis is a condition in which bone destruction happens more quickly than new bone creation. After menopause, you may be at an increased risk for osteoporosis. To help prevent  osteoporosis or the bone fractures that can happen because of osteoporosis, the following is recommended:  If you are 92-48 years old, get at least 1,000 mg of calcium and at least 600 mg of vitamin D per day.  If you are older than age 23 but younger than age 10, get at least 1,200 mg of calcium and at least 600 mg of vitamin D per day.  If you are older than age 61, get at least 1,200 mg of calcium and at least 800 mg of vitamin D per day. Smoking and excessive alcohol intake increase the risk of osteoporosis. Eat foods that are rich in calcium and vitamin D, and do weight-bearing exercises several  times each week as directed by your health care provider. WHAT SHOULD I KNOW ABOUT HOW MENOPAUSE AFFECTS [place]? Depression may occur at any age, but it is more common as you become older. Common symptoms of depression include:  Low or sad mood.  Changes in sleep patterns.  Changes in appetite or eating patterns.  Feeling an overall lack of motivation or enjoyment of activities that you previously enjoyed.  Frequent crying spells. Talk with your health care provider if you think that you are experiencing depression. WHAT SHOULD I KNOW ABOUT IMMUNIZATIONS? It is important that you get and maintain your immunizations. These include:  Tetanus, diphtheria, and pertussis (Tdap) booster vaccine.  Influenza every year before the flu season begins.  Pneumonia vaccine.  Shingles vaccine. Your health care provider may also recommend other immunizations.   This information is not intended to replace advice given to you by your health care provider. Make sure you discuss any questions you have with your health care provider.   Document Released: 12/25/2005 Document Revised: 11/23/2014 Document Reviewed: 07/05/2014 Elsevier Interactive Patient Education [employer].

## 2016-06-09 NOTE — Assessment & Plan Note (Signed)
Well controlled on current regimen. Renal function is overdue , no changes today. 

## 2016-06-09 NOTE — Assessment & Plan Note (Signed)
T score -2.2 in 2014.  Will repeat this year and treat if osteoporosis is present.

## 2016-06-09 NOTE — Assessment & Plan Note (Signed)
Lost to follow up for over a year.  On carb modified diet. And losing weight . She will return ofr a1 c, lipids and UA  Lab Results  Component Value Date   HGBA1C 6.7 (H) 02/15/2015   Lab Results  Component Value Date   CHOL 198 02/15/2015   HDL 39.60 02/15/2015   LDLCALC 125 (H) 02/15/2015   TRIG 168.0 (H) 02/15/2015   CHOLHDL 5 02/15/2015   Lab Results  Component Value Date   MICROALBUR <0.7 02/15/2015

## 2016-06-09 NOTE — Assessment & Plan Note (Signed)
Annual Medicare wellness  exam was done  and management of acute and chronic conditions .  During the course of the visit the patient was educated and counseled about appropriate screening and preventive services including : fall prevention , diabetes screening, nutrition counseling, colorectal cancer screening, and recommended immunizations.  Printed recommendations for health maintenance screenings was given.

## 2016-06-09 NOTE — Assessment & Plan Note (Signed)
contineu annual screening with left unilateral mammogram, ordered today

## 2016-06-09 NOTE — Assessment & Plan Note (Signed)
Secondary to ischemic cardiomyopathy with possible contribution from prior therapy with adriamycin.  Not limiting her activity,  Medical management advised by cardiology

## 2016-06-15 ENCOUNTER — Other Ambulatory Visit (INDEPENDENT_AMBULATORY_CARE_PROVIDER_SITE_OTHER): Payer: Medicare Other

## 2016-06-15 DIAGNOSIS — E785 Hyperlipidemia, unspecified: Secondary | ICD-10-CM

## 2016-06-15 DIAGNOSIS — E1169 Type 2 diabetes mellitus with other specified complication: Secondary | ICD-10-CM

## 2016-06-15 DIAGNOSIS — E119 Type 2 diabetes mellitus without complications: Secondary | ICD-10-CM | POA: Diagnosis not present

## 2016-06-15 DIAGNOSIS — Z79899 Other long term (current) drug therapy: Secondary | ICD-10-CM | POA: Diagnosis not present

## 2016-06-15 LAB — COMPREHENSIVE METABOLIC PANEL
ALBUMIN: 4.1 g/dL (ref 3.5–5.2)
ALT: 36 U/L — ABNORMAL HIGH (ref 0–35)
AST: 23 U/L (ref 0–37)
Alkaline Phosphatase: 51 U/L (ref 39–117)
BUN: 17 mg/dL (ref 6–23)
CHLORIDE: 107 meq/L (ref 96–112)
CO2: 26 meq/L (ref 19–32)
CREATININE: 0.55 mg/dL (ref 0.40–1.20)
Calcium: 9.3 mg/dL (ref 8.4–10.5)
GFR: 115.66 mL/min (ref >60.00)
Glucose, Bld: 113 mg/dL — ABNORMAL HIGH (ref 70–99)
Potassium: 4.2 mEq/L (ref 3.5–5.1)
SODIUM: 140 meq/L (ref 135–145)
Total Bilirubin: 0.5 mg/dL (ref 0.2–1.2)
Total Protein: 7.1 g/dL (ref 6.0–8.3)

## 2016-06-15 LAB — CBC WITH DIFFERENTIAL/PLATELET
BASOS PCT: 0.6 % (ref 0.0–3.0)
Basophils Absolute: 0 10*3/uL (ref 0.0–0.1)
EOS PCT: 2.7 % (ref 0.0–5.0)
Eosinophils Absolute: 0.2 10*3/uL (ref 0.0–0.7)
HCT: 39.8 % (ref 36.0–46.0)
HEMOGLOBIN: 13.3 g/dL (ref 12.0–15.0)
LYMPHS ABS: 2.3 10*3/uL (ref 0.7–4.0)
Lymphocytes Relative: 32 % (ref 12.0–46.0)
MCHC: 33.5 g/dL (ref 30.0–36.0)
MCV: 89.2 fl (ref 78.0–100.0)
MONO ABS: 0.5 10*3/uL (ref 0.1–1.0)
Monocytes Relative: 7 % (ref 3.0–12.0)
NEUTROS PCT: 57.7 % (ref 43.0–77.0)
Neutro Abs: 4.1 10*3/uL (ref 1.4–7.7)
Platelets: 229 10*3/uL (ref 150.0–400.0)
RBC: 4.46 Mil/uL (ref 3.87–5.11)
RDW: 14.2 % (ref 11.5–15.5)
WBC: 7.1 10*3/uL (ref 4.0–10.5)

## 2016-06-15 LAB — LIPID PANEL
CHOL/HDL RATIO: 5
Cholesterol: 178 mg/dL (ref 0–200)
HDL: 36.4 mg/dL — AB (ref >39.00)
LDL CALC: 116 mg/dL — AB (ref 0–99)
NONHDL: 141.27
Triglycerides: 125 mg/dL (ref 0.0–149.0)
VLDL: 25 mg/dL (ref 0.0–40.0)

## 2016-06-15 LAB — MICROALBUMIN / CREATININE URINE RATIO
Creatinine,U: 164.9 mg/dL
Microalb Creat Ratio: 1 mg/g (ref 0.0–30.0)
Microalb, Ur: 1.7 mg/dL (ref 0.0–1.9)

## 2016-06-15 LAB — VITAMIN B12: VITAMIN B 12: 1029 pg/mL — AB (ref 211–911)

## 2016-06-15 LAB — HEMOGLOBIN A1C: Hgb A1c MFr Bld: 6.5 % (ref 4.6–6.5)

## 2016-06-15 LAB — LDL CHOLESTEROL, DIRECT: Direct LDL: 135 mg/dL

## 2016-06-16 ENCOUNTER — Encounter: Payer: Self-pay | Admitting: Internal Medicine

## 2016-07-02 DIAGNOSIS — J449 Chronic obstructive pulmonary disease, unspecified: Secondary | ICD-10-CM | POA: Diagnosis not present

## 2016-07-07 ENCOUNTER — Other Ambulatory Visit: Payer: Self-pay | Admitting: Internal Medicine

## 2016-07-07 ENCOUNTER — Ambulatory Visit
Admission: RE | Admit: 2016-07-07 | Discharge: 2016-07-07 | Disposition: A | Discharge status: Home / Self Care | Payer: Medicare Other | Source: Ambulatory Visit | Attending: Internal Medicine | Admitting: Internal Medicine

## 2016-07-07 DIAGNOSIS — Z853 Personal history of malignant neoplasm of breast: Secondary | ICD-10-CM

## 2016-07-07 DIAGNOSIS — Z1239 Encounter for other screening for malignant neoplasm of breast: Secondary | ICD-10-CM

## 2016-07-07 DIAGNOSIS — M8589 Other specified disorders of bone density and structure, multiple sites: Secondary | ICD-10-CM | POA: Insufficient documentation

## 2016-07-07 DIAGNOSIS — Z1231 Encounter for screening mammogram for malignant neoplasm of breast: Secondary | ICD-10-CM | POA: Insufficient documentation

## 2016-07-07 DIAGNOSIS — M85862 Other specified disorders of bone density and structure, left lower leg: Secondary | ICD-10-CM | POA: Diagnosis not present

## 2016-07-07 DIAGNOSIS — M8580 Other specified disorders of bone density and structure, unspecified site: Secondary | ICD-10-CM

## 2016-07-07 DIAGNOSIS — M899 Disorder of bone, unspecified: Secondary | ICD-10-CM | POA: Diagnosis not present

## 2016-07-07 HISTORY — DX: Malignant neoplasm of unspecified site of unspecified female breast: C50.919

## 2016-07-08 ENCOUNTER — Encounter: Payer: Self-pay | Admitting: Internal Medicine

## 2016-07-09 ENCOUNTER — Encounter: Payer: Self-pay | Admitting: Internal Medicine

## 2016-08-05 ENCOUNTER — Ambulatory Visit (INDEPENDENT_AMBULATORY_CARE_PROVIDER_SITE_OTHER): Payer: Medicare Other | Admitting: Family Medicine

## 2016-08-05 ENCOUNTER — Encounter: Payer: Self-pay | Admitting: Family Medicine

## 2016-08-05 DIAGNOSIS — J0100 Acute maxillary sinusitis, unspecified: Secondary | ICD-10-CM | POA: Diagnosis not present

## 2016-08-05 MED ORDER — GUAIFENESIN-CODEINE 100-10 MG/5ML PO SOLN: 10.0000 mL | Freq: Three times a day (TID) | ORAL | 0 refills | Status: DC | PRN

## 2016-08-05 MED ORDER — DOXYCYCLINE HYCLATE 100 MG PO TABS: 100.0000 mg | ORAL_TABLET | Freq: Two times a day (BID) | ORAL | 0 refills | Status: DC

## 2016-08-05 NOTE — Patient Instructions (Signed)
Nice to meet you. You likely have a sinus infection. We are going to treat you with doxycycline. You can take the codeine cough syrup for cough at night. If this makes you too drowsy or nauseous please let us know. If you develop shortness of breath, cough productive of blood, persistent fevers, or any new or changing symptoms please seek medical attention.

## 2016-08-05 NOTE — Assessment & Plan Note (Signed)
Patient's symptoms most consistent with sinusitis. Patient with maxillary sinus congestion and fever and purulent discharge. Mildly tachycardic today. Lung exam normal. BP stable. Patient with penicillin allergy thus we'll treat with doxycycline. Codeine cough syrup for cough. Warned that this could make her drowsy. If not improving over the next several days she'll follow-up. Given return precautions.

## 2016-08-05 NOTE — Progress Notes (Signed)
  Tommi Rumps, MD Phone: [phone]  Stacie Mcdonald is a 70 y.o. female who presents today for same-day visit.  Patient reports 8 days of symptoms. Started with sore throat followed by nasal and sinus congestion and ear pain. Has been coughing as well. Can get anything up with her cough. She's blowing greenish yellowish blood tinged mucus out of her nose. Temperatures of 100F at home. Notes yesterday she had some clear drainage and mucus buildup from her eyes and they're matted shut though this is resolved today. She's not had any trouble breathing. No sick contacts.  PMH: nonsmoker.   ROS see history of present illness  Objective  Physical Exam Vitals:   08/05/16 1310  BP: 138/84  Pulse: (!) 103  Temp: (!) 100.4 F (38 C)    BP Readings from Last 3 Encounters:  08/05/16 138/84  06/08/16 122/62  09/09/15 118/66   Wt Readings from Last 3 Encounters:  08/05/16 177 lb 9.6 oz (80.6 kg)  06/08/16 177 lb (80.3 kg)  09/09/15 178 lb 9.6 oz (81 kg)    Physical Exam  Constitutional: No distress.  HENT:  Head: Normocephalic and atraumatic.  Mild oropharyngeal erythema with no exudate, right TM mildly erythematous though no purulent fluid, normal left TM  Eyes: Conjunctivae are normal. Pupils are equal, round, and reactive to light.  Neck: Neck supple.  Cardiovascular:  Tachycardic, no murmurs, rubs, or gallops  Pulmonary/Chest: Effort normal and breath sounds normal.  Lymphadenopathy:    She has no cervical adenopathy.  Neurological: She is alert. Gait normal.  Skin: Skin is warm and dry. She is not diaphoretic.     Assessment/Plan: Please see individual problem list.  Sinusitis, acute maxillary Patient's symptoms most consistent with sinusitis. Patient with maxillary sinus congestion and fever and purulent discharge. Mildly tachycardic today. Lung exam normal. BP stable. Patient with penicillin allergy thus we'll treat with doxycycline. Codeine cough syrup for  cough. Warned that this could make her drowsy. If not improving over the next several days she'll follow-up. Given return precautions.   No orders of the defined types were placed in this encounter.   Meds ordered this encounter  Medications  . doxycycline (VIBRA-TABS) 100 MG tablet    Sig: Take 1 tablet (100 mg total) by mouth 2 (two) times daily.    Dispense:  14 tablet    Refill:  0  . guaiFENesin-codeine 100-10 MG/5ML syrup    Sig: Take 10 mLs by mouth 3 (three) times daily as needed for cough.    Dispense:  120 mL    Refill:  0    Tommi Rumps, MD [place]

## 2016-08-05 NOTE — Progress Notes (Signed)
Pre visit review using our clinic review tool, if applicable. No additional management support is needed unless otherwise documented below in the visit note. 

## 2016-08-20 ENCOUNTER — Ambulatory Visit: Payer: Medicare Other

## 2016-09-10 ENCOUNTER — Encounter: Payer: Self-pay | Admitting: Internal Medicine

## 2016-09-10 ENCOUNTER — Ambulatory Visit (INDEPENDENT_AMBULATORY_CARE_PROVIDER_SITE_OTHER): Payer: Medicare Other | Admitting: Internal Medicine

## 2016-09-10 VITALS — BP 128/70 | HR 79 | Temp 98.4°F | Resp 12 | Ht 63.0 in | Wt 175.0 lb

## 2016-09-10 DIAGNOSIS — E785 Hyperlipidemia, unspecified: Secondary | ICD-10-CM

## 2016-09-10 DIAGNOSIS — I10 Essential (primary) hypertension: Secondary | ICD-10-CM

## 2016-09-10 DIAGNOSIS — E119 Type 2 diabetes mellitus without complications: Secondary | ICD-10-CM | POA: Diagnosis not present

## 2016-09-10 DIAGNOSIS — F5102 Adjustment insomnia: Secondary | ICD-10-CM

## 2016-09-10 DIAGNOSIS — E559 Vitamin D deficiency, unspecified: Secondary | ICD-10-CM | POA: Diagnosis not present

## 2016-09-10 MED ORDER — FREESTYLE LANCETS MISC: 3 refills | Status: DC

## 2016-09-10 MED ORDER — ALPRAZOLAM 0.5 MG PO TABS: 0.5000 mg | ORAL_TABLET | Freq: Every evening | ORAL | 3 refills | Status: DC | PRN

## 2016-09-10 NOTE — Progress Notes (Signed)
Subjective:  Patient ID: Stacie Mcdonald, female    DOB: 1946-06-10  Age: 70 y.o. MRN: [id number]  CC: The primary encounter diagnosis was Hyperlipidemia with target low density lipoprotein (LDL) cholesterol less than 70 mg/dL. Diagnoses of Diabetes mellitus without complication ([place]), Essential hypertension, Vitamin D deficiency, and Insomnia due to stress were also pertinent to this visit.  HPI Stacie Mcdonald presents for  3 month  follow up on diabetes.  Patient has no complaints today.  Patient is following a low glycemic index diet and taking all prescribed medications regularly without side effects.  Fasting sugars have been under less than 120 most of the time and post prandials have been under 160 except on rare occasions. Patient is exercising about 3 times per week and intentionally trying to lose weight .  Patient has had an eye exam in the last 12 months and checks feet regularly for signs of infection.  Patient does not walk barefoot outside,  And denies any numbness tingling or burning in feet. Patient is up to date on all recommended vaccinations   Lab Results  Component Value Date   HGBA1C 6.5 06/15/2016    Some new insomnia , early wakenings  After 3 hours bladder wakes her up,  Can't sleep for hours.She is aggravated by the loss of control she has experienced regarding the management of her kitchen renovation.  Her sister in law is running roughshod over her preferences and she has bee unable to ding a way to advocate for herself.       Outpatient Medications Prior to Visit  Medication Sig Dispense Refill  . aspirin 81 MG tablet Take 160 mg by mouth daily.      . clopidogrel (PLAVIX) 75 MG tablet TAKE ONE TABLET BY MOUTH DAILY 90 tablet 1  . digoxin (LANOXIN) 0.125 MG tablet Take 0.125 mg by mouth daily.    . furosemide (LASIX) 20 MG tablet Take 1 tablet (20 mg total) by mouth daily. (Patient taking differently: Take 40 mg by mouth daily. ) 30 tablet 3  . isosorbide  mononitrate (IMDUR) 30 MG 24 hr tablet Take 2 tablets (60 mg total) by mouth daily. 30 tablet 11  . losartan (COZAAR) 100 MG tablet TAKE 1 TABLET (100 MG TOTAL) BY MOUTH DAILY. 90 tablet 1  . metoprolol (TOPROL-XL) 200 MG 24 hr tablet TAKE 1 TABLET BY MOUTH DAILY. 90 tablet 1  . niacin 500 MG tablet Take 1 tablet (500 mg total) by mouth daily. 90 tablet 1  . nitrofurantoin (MACRODANTIN) 100 MG capsule Take 1 capsule (100 mg total) by mouth daily. 90 capsule 1  . omeprazole (PRILOSEC) 20 MG capsule Take 1 capsule (20 mg total) by mouth daily. 90 capsule 1  . ONE TOUCH ULTRA TEST test strip CHECK DAILY 100 each 2  . ONETOUCH DELICA LANCETS 99991111 MISC CHECK DAILY 100 each 2  . potassium chloride SA (K-DUR,KLOR-CON) 20 MEQ tablet Take 1 tablet (20 mEq total) by mouth daily. 30 tablet 3  . doxycycline (VIBRA-TABS) 100 MG tablet Take 1 tablet (100 mg total) by mouth 2 (two) times daily. 14 tablet 0  . guaiFENesin-codeine 100-10 MG/5ML syrup Take 10 mLs by mouth 3 (three) times daily as needed for cough. 120 mL 0   No facility-administered medications prior to visit.     Review of Systems;  Patient denies headache, fevers, malaise, unintentional weight loss, skin rash, eye pain, sinus congestion and sinus pain, sore throat, dysphagia,  hemoptysis , cough,  dyspnea, wheezing, chest pain, palpitations, orthopnea, edema, abdominal pain, nausea, melena, diarrhea, constipation, flank pain, dysuria, hematuria, urinary  Frequency, nocturia, numbness, tingling, seizures,  Focal weakness, Loss of consciousness,  Tremor, insomnia, depression, anxiety, and suicidal ideation.      Objective:  BP 128/70   Pulse 79   Temp 98.4 F (36.9 C) (Oral)   Resp 12   Ht 5\' 3"  (1.6 m)   Wt 175 lb (79.4 kg)   SpO2 98%   BMI 31.00 kg/m   BP Readings from Last 3 Encounters:  09/10/16 128/70  08/05/16 138/84  06/08/16 122/62    Wt Readings from Last 3 Encounters:  09/10/16 175 lb (79.4 kg)  08/05/16 177 lb 9.6 oz  (80.6 kg)  06/08/16 177 lb (80.3 kg)    General appearance: alert, cooperative and appears stated age Ears: normal TM's and external ear canals both ears Throat: lips, mucosa, and tongue normal; teeth and gums normal Neck: no adenopathy, no carotid bruit, supple, symmetrical, trachea midline and thyroid not enlarged, symmetric, no tenderness/mass/nodules Back: symmetric, no curvature. ROM normal. No CVA tenderness. Lungs: clear to auscultation bilaterally Heart: regular rate and rhythm, S1, S2 normal, no murmur, click, rub or gallop Abdomen: soft, non-tender; bowel sounds normal; no masses,  no organomegaly Pulses: 2+ and symmetric Skin: Skin color, texture, turgor normal. No rashes or lesions Lymph nodes: Cervical, supraclavicular, and axillary nodes normal.  Lab Results  Component Value Date   HGBA1C 6.5 06/15/2016   HGBA1C 6.7 (H) 02/15/2015    Lab Results  Component Value Date   CREATININE 0.55 06/15/2016   CREATININE 0.64 02/15/2015   CREATININE 0.7 03/20/2014    Lab Results  Component Value Date   WBC 7.1 06/15/2016   HGB 13.3 06/15/2016   HCT 39.8 06/15/2016   PLT 229.0 06/15/2016   GLUCOSE 113 (H) 06/15/2016   CHOL 178 06/15/2016   TRIG 125.0 06/15/2016   HDL 36.40 (L) 06/15/2016   LDLDIRECT 135.0 06/15/2016   LDLCALC 116 (H) 06/15/2016   ALT 36 (H) 06/15/2016   AST 23 06/15/2016   NA 140 06/15/2016   K 4.2 06/15/2016   CL 107 06/15/2016   CREATININE 0.55 06/15/2016   BUN 17 06/15/2016   CO2 26 06/15/2016   TSH 4.08 02/15/2015   INR 1.0 09/27/2012   HGBA1C 6.5 06/15/2016   MICROALBUR 1.7 06/15/2016    Dg Bone Density  Result Date: 07/07/2016 EXAM: DUAL X-RAY ABSORPTIOMETRY (DXA) FOR BONE MINERAL DENSITY IMPRESSION: Dear Dr. Derrel Nip, Your patient Stacie Mcdonald completed a BMD test on 07/07/2016 using the [place] (analysis version: 14.10) manufactured by [employer]. The following summarizes the results of our evaluation. PATIENT  BIOGRAPHICAL: Name: Mcdonald, Stacie T Patient ID: [id number] Birth Date: 08-18-1946 Height: 63.0 in. Gender: Female Exam Date: 07/07/2016 Weight: 177.0 lbs. Indications: Caucasian, Family History of Fracture, Height Loss, History of Breast Cancer, Family Hist. (Parent hip fracture) Fractures: Treatments: ASPRIN 81 MG, omeprazole, plavix, Vit D ASSESSMENT: The BMD measured at Femur Neck Left is 0.723 g/cm2 with a T-score of -2.3. This patient is considered osteopenic according to [facility]) criteria. Site Region Measured Measured WHO Young Adult BMD Date       Age      Classification T-score AP Spine L1-L4 6/07/[age] 71.5 Normal -0.6 1.115 g/cm2 AP Spine L1-L4 01/24/2013 68.0 Normal -0.7 1.104 g/cm2 AP Spine L1-L4 11/27/2009 64.8 Osteopenia -1.3 1.030 g/cm2 DualFemur Neck Left 07/07/2016 71.5 Osteopenia -2.3 0.723 g/cm2 DualFemur Neck Left  01/24/2013 68.0 Osteopenia -2.2 0.728 g/cm2 DualFemur Neck Left 11/27/2009 64.8 Osteopenia -2.1 0.750 g/cm2 World Health Organization [facility]) criteria for post-menopausal, Caucasian Women: Normal:       T-score at or above -1 SD Osteopenia:   T-score between -1 and -2.5 SD Osteoporosis: T-score at or below -2.5 SD RECOMMENDATIONS: [place] recommends that FDA-approved medical therapies be considered in postmenopausal women and men age 22 or older with a: 1. Hip or vertebral (clinical or morphometric) fracture. 2. T-score of < -2.5 at the spine or hip. 3. Ten-year fracture probability by FRAX of 3% or greater for hip fracture or 20% or greater for major osteoporotic fracture. All treatment decisions require clinical judgment and consideration of individual patient factors, including patient preferences, co-morbidities, previous drug use, risk factors not captured in the FRAX model (e.g. falls, vitamin D deficiency, increased bone turnover, interval significant decline in bone density) and possible under - or over-estimation of fracture risk  by FRAX. All patients should ensure an adequate intake of dietary calcium (1200 mg/d) and vitamin D (800 IU daily) unless contraindicated. FOLLOW-UP: People with diagnosed cases of osteoporosis or at high risk for fracture should have regular bone mineral density tests. For patients eligible for Medicare, routine testing is allowed once every 2 years. The testing frequency can be increased to one year for patients who have rapidly progressing disease, those who are receiving or discontinuing medical therapy to restore bone mass, or have additional risk factors. I have reviewed this report, and agree with the above findings. [facility] Radiology Dear Dr. Derrel Nip, Your patient Keelyn T Mcdonald completed a FRAX assessment on 07/07/2016 using the [place] (analysis version: 14.10) manufactured by [employer]. The following summarizes the results of our evaluation. PATIENT BIOGRAPHICAL: Name: Mcdonald, Dream T Patient ID: [id number] Birth Date: [birth year] Height:    63.0 in. Gender:     [age]    [age]       Weight:    177.0 lbs. Ethnicity:  White                            Exam Date: 07/07/2016 FRAX* RESULTS:  (version: 3.5) 10-year Probability of Fracture1 Major Osteoporotic Fracture2 Hip Fracture 21.4% 8.1% Population: [place] (Caucasian) Risk Factors: Family Hist. (Parent hip fracture) Based on Femur (Left) Neck BMD 1 -The 10-year probability of fracture may be lower than reported if the patient has received treatment. 2 -Major Osteoporotic Fracture: Clinical Spine, Forearm, Hip or Shoulder *FRAX is a Materials engineer of the [employer] for Metabolic Bone Disease, a [place] (WHO) [facility]. ASSESSMENT: The probability of a major osteoporotic fracture is 21.4% within the next ten years. The probability of a hip fracture is 8.1% within the next ten years. . Electronically Signed   By: Kerby Moors M.D.   On: 07/07/2016 10:11   Mm Screening  Breast Tomo Uni L  Result Date: 07/07/2016 CLINICAL DATA:  Screening. EXAM: 2D DIGITAL SCREENING UNILATERAL LEFT MAMMOGRAM WITH CAD AND ADJUNCT TOMO COMPARISON:  Previous exam(s). ACR Breast Density Category c: The breast tissue is heterogeneously dense, which may obscure small masses. FINDINGS: There are no findings suspicious for malignancy. Images were processed with CAD. IMPRESSION: No mammographic evidence of malignancy. A result letter of this screening mammogram will be mailed directly to the patient. RECOMMENDATION: Screening mammogram in one year. (Code:SM-B-01Y) BI-RADS CATEGORY  1:  Negative. Electronically Signed   By: Lajean Manes M.D.   On: 07/07/2016 11:22    Assessment & Plan:   Problem List Items Addressed This Visit    Essential hypertension    Well controlled on current regimen. Renal function stable, no changes today.  Lab Results  Component Value Date   CREATININE 0.55 06/15/2016   Lab Results  Component Value Date   NA 140 06/15/2016   K 4.2 06/15/2016   CL 107 06/15/2016   CO2 26 06/15/2016         Relevant Orders   Comprehensive metabolic panel   Diabetes mellitus without complication ([place])    Historically well-controlled on carbohydrate restricted diet.   hemoglobin A1c has been consistently at or  less than 7.0 . Patient is up-to-date on eye exams and foot exam is normal today. Patient is due for urine microalbumin to creatinine ratio at next visit. Patient is tolerating statin therapy for CAD risk reduction and on ACE/ARB for reduction in proteinuria.  Current labs are due in December  Lab Results  Component Value Date   HGBA1C 6.5 06/15/2016   Lab Results  Component Value Date   CHOL 178 06/15/2016   HDL 36.40 (L) 06/15/2016   LDLCALC 116 (H) 06/15/2016   LDLDIRECT 135.0 06/15/2016   TRIG 125.0 06/15/2016   CHOLHDL 5 06/15/2016   Lab Results  Component Value Date   MICROALBUR 1.7 06/15/2016          Relevant Orders   Comprehensive  metabolic panel   Lipid panel   Hemoglobin A1c   Insomnia due to stress    Trial of alprazolam for early waking The risks and benefits of benzodiazepine use were discussed with patient today including excessive sedation leading to respiratory depression,  impaired thinking/driving, and addiction.  Patient was advised to avoid concurrent use with alcohol, to use medication only as needed and not to share with others  .       Hyperlipidemia with target low density lipoprotein (LDL) cholesterol less than 70 mg/dL - Primary   Relevant Orders   LDL cholesterol, direct    Other Visit Diagnoses    Vitamin D deficiency       Relevant Orders   VITAMIN D 25 Hydroxy (Vit-D Deficiency, Fractures)     A total of 25 minutes of face to face time was spent with patient more than half of which was spent in counselling and coordination of care    I have discontinued Ms. Berteau doxycycline and guaiFENesin-codeine. I am also having her start on ALPRAZolam and freestyle. Additionally, I am having her maintain her aspirin, isosorbide mononitrate, clopidogrel, furosemide, potassium chloride SA, ONE TOUCH ULTRA TEST, ONETOUCH DELICA LANCETS 99991111, losartan, niacin, metoprolol, omeprazole, nitrofurantoin, and digoxin.  Meds ordered this encounter  Medications  . ALPRAZolam (XANAX) 0.5 MG tablet    Sig: Take 1 tablet (0.5 mg total) by mouth at bedtime as needed for anxiety or sleep.    Dispense:  30 tablet    Refill:  3  . Lancets (FREESTYLE) lancets    Sig: uSE TO CHECK SUGARS ONCE DAILY e11.9    Dispense:  100 each    Refill:  3    Medications Discontinued During This Encounter  Medication Reason  . doxycycline (VIBRA-TABS) 100 MG tablet Completed Course  . guaiFENesin-codeine 100-10 MG/5ML syrup Completed Course    Follow-up: Return in about 6 months (around 03/11/2017) for fasting labs in December .   Crecencio Mc, MD

## 2016-09-10 NOTE — Patient Instructions (Signed)
You are doing well !  Return for fasting labs in December  Adding alprazolam to take as needed for sleep issues  Remember these phrases:  " I take full responsibility for the compromise in taste caused by my decision "  "It has sentimental value to me "

## 2016-09-10 NOTE — Progress Notes (Signed)
Pre-visit discussion using our clinic review tool. No additional management support is needed unless otherwise documented below in the visit note.  

## 2016-09-12 DIAGNOSIS — F5102 Adjustment insomnia: Secondary | ICD-10-CM | POA: Insufficient documentation

## 2016-09-12 NOTE — Assessment & Plan Note (Signed)
Well controlled on current regimen. Renal function stable, no changes today.  Lab Results  Component Value Date   CREATININE 0.55 06/15/2016   Lab Results  Component Value Date   NA 140 06/15/2016   K 4.2 06/15/2016   CL 107 06/15/2016   CO2 26 06/15/2016

## 2016-09-12 NOTE — Assessment & Plan Note (Signed)
Historically well-controlled on carbohydrate restricted diet.   hemoglobin A1c has been consistently at or  less than 7.0 . Patient is up-to-date on eye exams and foot exam is normal today. Patient is due for urine microalbumin to creatinine ratio at next visit. Patient is tolerating statin therapy for CAD risk reduction and on ACE/ARB for reduction in proteinuria.  Current labs are due in December  Lab Results  Component Value Date   HGBA1C 6.5 06/15/2016   Lab Results  Component Value Date   CHOL 178 06/15/2016   HDL 36.40 (L) 06/15/2016   LDLCALC 116 (H) 06/15/2016   LDLDIRECT 135.0 06/15/2016   TRIG 125.0 06/15/2016   CHOLHDL 5 06/15/2016   Lab Results  Component Value Date   MICROALBUR 1.7 06/15/2016

## 2016-09-12 NOTE — Assessment & Plan Note (Signed)
Trial of alprazolam for early waking The risks and benefits of benzodiazepine use were discussed with patient today including excessive sedation leading to respiratory depression,  impaired thinking/driving, and addiction.  Patient was advised to avoid concurrent use with alcohol, to use medication only as needed and not to share with others  .

## 2016-09-16 DIAGNOSIS — I209 Angina pectoris, unspecified: Secondary | ICD-10-CM | POA: Diagnosis not present

## 2016-09-16 DIAGNOSIS — R0602 Shortness of breath: Secondary | ICD-10-CM | POA: Diagnosis not present

## 2016-09-16 DIAGNOSIS — J449 Chronic obstructive pulmonary disease, unspecified: Secondary | ICD-10-CM | POA: Diagnosis not present

## 2016-09-16 DIAGNOSIS — E669 Obesity, unspecified: Secondary | ICD-10-CM | POA: Diagnosis not present

## 2016-09-16 DIAGNOSIS — I5042 Chronic combined systolic (congestive) and diastolic (congestive) heart failure: Secondary | ICD-10-CM | POA: Diagnosis not present

## 2016-09-16 DIAGNOSIS — R634 Abnormal weight loss: Secondary | ICD-10-CM | POA: Diagnosis not present

## 2016-09-16 DIAGNOSIS — Z853 Personal history of malignant neoplasm of breast: Secondary | ICD-10-CM | POA: Diagnosis not present

## 2016-09-16 DIAGNOSIS — R000 Tachycardia, unspecified: Secondary | ICD-10-CM | POA: Diagnosis not present

## 2016-09-16 DIAGNOSIS — I5032 Chronic diastolic (congestive) heart failure: Secondary | ICD-10-CM | POA: Diagnosis not present

## 2016-09-16 DIAGNOSIS — K219 Gastro-esophageal reflux disease without esophagitis: Secondary | ICD-10-CM | POA: Diagnosis not present

## 2016-09-16 DIAGNOSIS — I2510 Atherosclerotic heart disease of native coronary artery without angina pectoris: Secondary | ICD-10-CM | POA: Diagnosis not present

## 2016-09-16 DIAGNOSIS — E785 Hyperlipidemia, unspecified: Secondary | ICD-10-CM | POA: Diagnosis not present

## 2016-09-21 ENCOUNTER — Encounter: Payer: Self-pay | Admitting: Internal Medicine

## 2016-11-02 ENCOUNTER — Other Ambulatory Visit: Payer: Medicare Other

## 2016-11-19 DIAGNOSIS — R0602 Shortness of breath: Secondary | ICD-10-CM | POA: Diagnosis not present

## 2016-11-19 DIAGNOSIS — R05 Cough: Secondary | ICD-10-CM | POA: Diagnosis not present

## 2016-11-19 DIAGNOSIS — J439 Emphysema, unspecified: Secondary | ICD-10-CM | POA: Diagnosis not present

## 2016-11-19 DIAGNOSIS — J986 Disorders of diaphragm: Secondary | ICD-10-CM | POA: Diagnosis not present

## 2016-11-20 ENCOUNTER — Other Ambulatory Visit: Payer: Self-pay | Admitting: Specialist

## 2016-11-20 DIAGNOSIS — R0602 Shortness of breath: Secondary | ICD-10-CM

## 2016-11-20 DIAGNOSIS — J986 Disorders of diaphragm: Secondary | ICD-10-CM

## 2016-11-26 ENCOUNTER — Ambulatory Visit
Admission: RE | Admit: 2016-11-26 | Discharge: 2016-11-26 | Disposition: A | Discharge status: Home / Self Care | Payer: Medicare Other | Source: Ambulatory Visit | Attending: Specialist | Admitting: Specialist

## 2016-11-26 DIAGNOSIS — R0602 Shortness of breath: Secondary | ICD-10-CM | POA: Insufficient documentation

## 2016-11-26 DIAGNOSIS — J986 Disorders of diaphragm: Secondary | ICD-10-CM | POA: Diagnosis not present

## 2016-12-15 ENCOUNTER — Other Ambulatory Visit (INDEPENDENT_AMBULATORY_CARE_PROVIDER_SITE_OTHER): Payer: Medicare Other

## 2016-12-15 DIAGNOSIS — I10 Essential (primary) hypertension: Secondary | ICD-10-CM

## 2016-12-15 DIAGNOSIS — E785 Hyperlipidemia, unspecified: Secondary | ICD-10-CM | POA: Diagnosis not present

## 2016-12-15 DIAGNOSIS — E559 Vitamin D deficiency, unspecified: Secondary | ICD-10-CM

## 2016-12-15 DIAGNOSIS — E119 Type 2 diabetes mellitus without complications: Secondary | ICD-10-CM | POA: Diagnosis not present

## 2016-12-15 LAB — VITAMIN D 25 HYDROXY (VIT D DEFICIENCY, FRACTURES): VITD: 39.02 ng/mL (ref 30.00–100.00)

## 2016-12-15 LAB — LIPID PANEL
CHOL/HDL RATIO: 5
Cholesterol: 191 mg/dL (ref 0–200)
HDL: 35.6 mg/dL — ABNORMAL LOW (ref >39.00)
LDL CALC: 117 mg/dL — AB (ref 0–99)
NONHDL: 155.08
Triglycerides: 192 mg/dL — ABNORMAL HIGH (ref 0.0–149.0)
VLDL: 38.4 mg/dL (ref 0.0–40.0)

## 2016-12-15 LAB — COMPREHENSIVE METABOLIC PANEL
ALK PHOS: 56 U/L (ref 39–117)
ALT: 33 U/L (ref 0–35)
AST: 21 U/L (ref 0–37)
Albumin: 4.3 g/dL (ref 3.5–5.2)
BILIRUBIN TOTAL: 0.5 mg/dL (ref 0.2–1.2)
BUN: 17 mg/dL (ref 6–23)
CO2: 29 meq/L (ref 19–32)
CREATININE: 0.5 mg/dL (ref 0.40–1.20)
Calcium: 9.4 mg/dL (ref 8.4–10.5)
Chloride: 106 mEq/L (ref 96–112)
GFR: 128.92 mL/min (ref >60.00)
GLUCOSE: 110 mg/dL — AB (ref 70–99)
Potassium: 4.8 mEq/L (ref 3.5–5.1)
SODIUM: 140 meq/L (ref 135–145)
TOTAL PROTEIN: 7 g/dL (ref 6.0–8.3)

## 2016-12-15 LAB — LDL CHOLESTEROL, DIRECT: Direct LDL: 133 mg/dL

## 2016-12-15 LAB — HEMOGLOBIN A1C: Hgb A1c MFr Bld: 6.5 % (ref 4.6–6.5)

## 2016-12-17 ENCOUNTER — Encounter: Payer: Self-pay | Admitting: Internal Medicine

## 2016-12-23 DIAGNOSIS — L821 Other seborrheic keratosis: Secondary | ICD-10-CM | POA: Diagnosis not present

## 2016-12-23 DIAGNOSIS — D492 Neoplasm of unspecified behavior of bone, soft tissue, and skin: Secondary | ICD-10-CM | POA: Diagnosis not present

## 2016-12-25 DIAGNOSIS — R51 Headache: Secondary | ICD-10-CM | POA: Diagnosis not present

## 2016-12-25 DIAGNOSIS — M9902 Segmental and somatic dysfunction of thoracic region: Secondary | ICD-10-CM | POA: Diagnosis not present

## 2016-12-25 DIAGNOSIS — M5414 Radiculopathy, thoracic region: Secondary | ICD-10-CM | POA: Diagnosis not present

## 2016-12-25 DIAGNOSIS — M9901 Segmental and somatic dysfunction of cervical region: Secondary | ICD-10-CM | POA: Diagnosis not present

## 2016-12-28 DIAGNOSIS — M9901 Segmental and somatic dysfunction of cervical region: Secondary | ICD-10-CM | POA: Diagnosis not present

## 2016-12-28 DIAGNOSIS — M5414 Radiculopathy, thoracic region: Secondary | ICD-10-CM | POA: Diagnosis not present

## 2016-12-28 DIAGNOSIS — R51 Headache: Secondary | ICD-10-CM | POA: Diagnosis not present

## 2016-12-28 DIAGNOSIS — M9902 Segmental and somatic dysfunction of thoracic region: Secondary | ICD-10-CM | POA: Diagnosis not present

## 2016-12-29 DIAGNOSIS — M9901 Segmental and somatic dysfunction of cervical region: Secondary | ICD-10-CM | POA: Diagnosis not present

## 2016-12-29 DIAGNOSIS — R51 Headache: Secondary | ICD-10-CM | POA: Diagnosis not present

## 2016-12-29 DIAGNOSIS — M5414 Radiculopathy, thoracic region: Secondary | ICD-10-CM | POA: Diagnosis not present

## 2016-12-29 DIAGNOSIS — M9902 Segmental and somatic dysfunction of thoracic region: Secondary | ICD-10-CM | POA: Diagnosis not present

## 2016-12-30 DIAGNOSIS — M5414 Radiculopathy, thoracic region: Secondary | ICD-10-CM | POA: Diagnosis not present

## 2016-12-30 DIAGNOSIS — M9902 Segmental and somatic dysfunction of thoracic region: Secondary | ICD-10-CM | POA: Diagnosis not present

## 2016-12-30 DIAGNOSIS — R51 Headache: Secondary | ICD-10-CM | POA: Diagnosis not present

## 2016-12-30 DIAGNOSIS — M9901 Segmental and somatic dysfunction of cervical region: Secondary | ICD-10-CM | POA: Diagnosis not present

## 2017-01-01 DIAGNOSIS — M5414 Radiculopathy, thoracic region: Secondary | ICD-10-CM | POA: Diagnosis not present

## 2017-01-01 DIAGNOSIS — M9901 Segmental and somatic dysfunction of cervical region: Secondary | ICD-10-CM | POA: Diagnosis not present

## 2017-01-01 DIAGNOSIS — R51 Headache: Secondary | ICD-10-CM | POA: Diagnosis not present

## 2017-01-01 DIAGNOSIS — M9902 Segmental and somatic dysfunction of thoracic region: Secondary | ICD-10-CM | POA: Diagnosis not present

## 2017-01-04 DIAGNOSIS — M9902 Segmental and somatic dysfunction of thoracic region: Secondary | ICD-10-CM | POA: Diagnosis not present

## 2017-01-04 DIAGNOSIS — R51 Headache: Secondary | ICD-10-CM | POA: Diagnosis not present

## 2017-01-04 DIAGNOSIS — M5414 Radiculopathy, thoracic region: Secondary | ICD-10-CM | POA: Diagnosis not present

## 2017-01-04 DIAGNOSIS — M9901 Segmental and somatic dysfunction of cervical region: Secondary | ICD-10-CM | POA: Diagnosis not present

## 2017-01-05 DIAGNOSIS — M9901 Segmental and somatic dysfunction of cervical region: Secondary | ICD-10-CM | POA: Diagnosis not present

## 2017-01-05 DIAGNOSIS — M9902 Segmental and somatic dysfunction of thoracic region: Secondary | ICD-10-CM | POA: Diagnosis not present

## 2017-01-05 DIAGNOSIS — R51 Headache: Secondary | ICD-10-CM | POA: Diagnosis not present

## 2017-01-05 DIAGNOSIS — M5414 Radiculopathy, thoracic region: Secondary | ICD-10-CM | POA: Diagnosis not present

## 2017-01-06 DIAGNOSIS — M5414 Radiculopathy, thoracic region: Secondary | ICD-10-CM | POA: Diagnosis not present

## 2017-01-06 DIAGNOSIS — R51 Headache: Secondary | ICD-10-CM | POA: Diagnosis not present

## 2017-01-06 DIAGNOSIS — M9901 Segmental and somatic dysfunction of cervical region: Secondary | ICD-10-CM | POA: Diagnosis not present

## 2017-01-06 DIAGNOSIS — M9902 Segmental and somatic dysfunction of thoracic region: Secondary | ICD-10-CM | POA: Diagnosis not present

## 2017-01-11 DIAGNOSIS — M9902 Segmental and somatic dysfunction of thoracic region: Secondary | ICD-10-CM | POA: Diagnosis not present

## 2017-01-11 DIAGNOSIS — M9901 Segmental and somatic dysfunction of cervical region: Secondary | ICD-10-CM | POA: Diagnosis not present

## 2017-01-11 DIAGNOSIS — R51 Headache: Secondary | ICD-10-CM | POA: Diagnosis not present

## 2017-01-11 DIAGNOSIS — M5414 Radiculopathy, thoracic region: Secondary | ICD-10-CM | POA: Diagnosis not present

## 2017-01-13 DIAGNOSIS — M9902 Segmental and somatic dysfunction of thoracic region: Secondary | ICD-10-CM | POA: Diagnosis not present

## 2017-01-13 DIAGNOSIS — M5414 Radiculopathy, thoracic region: Secondary | ICD-10-CM | POA: Diagnosis not present

## 2017-01-13 DIAGNOSIS — M9901 Segmental and somatic dysfunction of cervical region: Secondary | ICD-10-CM | POA: Diagnosis not present

## 2017-01-13 DIAGNOSIS — R51 Headache: Secondary | ICD-10-CM | POA: Diagnosis not present

## 2017-01-18 DIAGNOSIS — R51 Headache: Secondary | ICD-10-CM | POA: Diagnosis not present

## 2017-01-18 DIAGNOSIS — M5414 Radiculopathy, thoracic region: Secondary | ICD-10-CM | POA: Diagnosis not present

## 2017-01-18 DIAGNOSIS — M9901 Segmental and somatic dysfunction of cervical region: Secondary | ICD-10-CM | POA: Diagnosis not present

## 2017-01-18 DIAGNOSIS — M9902 Segmental and somatic dysfunction of thoracic region: Secondary | ICD-10-CM | POA: Diagnosis not present

## 2017-01-20 DIAGNOSIS — R51 Headache: Secondary | ICD-10-CM | POA: Diagnosis not present

## 2017-01-20 DIAGNOSIS — M9902 Segmental and somatic dysfunction of thoracic region: Secondary | ICD-10-CM | POA: Diagnosis not present

## 2017-01-20 DIAGNOSIS — M5414 Radiculopathy, thoracic region: Secondary | ICD-10-CM | POA: Diagnosis not present

## 2017-01-20 DIAGNOSIS — M9901 Segmental and somatic dysfunction of cervical region: Secondary | ICD-10-CM | POA: Diagnosis not present

## 2017-01-25 DIAGNOSIS — R51 Headache: Secondary | ICD-10-CM | POA: Diagnosis not present

## 2017-01-25 DIAGNOSIS — M9902 Segmental and somatic dysfunction of thoracic region: Secondary | ICD-10-CM | POA: Diagnosis not present

## 2017-01-25 DIAGNOSIS — M5414 Radiculopathy, thoracic region: Secondary | ICD-10-CM | POA: Diagnosis not present

## 2017-01-25 DIAGNOSIS — M9901 Segmental and somatic dysfunction of cervical region: Secondary | ICD-10-CM | POA: Diagnosis not present

## 2017-01-27 DIAGNOSIS — M5414 Radiculopathy, thoracic region: Secondary | ICD-10-CM | POA: Diagnosis not present

## 2017-01-27 DIAGNOSIS — M9901 Segmental and somatic dysfunction of cervical region: Secondary | ICD-10-CM | POA: Diagnosis not present

## 2017-01-27 DIAGNOSIS — M9902 Segmental and somatic dysfunction of thoracic region: Secondary | ICD-10-CM | POA: Diagnosis not present

## 2017-01-27 DIAGNOSIS — R51 Headache: Secondary | ICD-10-CM | POA: Diagnosis not present

## 2017-01-29 DIAGNOSIS — M9901 Segmental and somatic dysfunction of cervical region: Secondary | ICD-10-CM | POA: Diagnosis not present

## 2017-01-29 DIAGNOSIS — M9902 Segmental and somatic dysfunction of thoracic region: Secondary | ICD-10-CM | POA: Diagnosis not present

## 2017-01-29 DIAGNOSIS — R51 Headache: Secondary | ICD-10-CM | POA: Diagnosis not present

## 2017-01-29 DIAGNOSIS — M5414 Radiculopathy, thoracic region: Secondary | ICD-10-CM | POA: Diagnosis not present

## 2017-02-01 DIAGNOSIS — M9901 Segmental and somatic dysfunction of cervical region: Secondary | ICD-10-CM | POA: Diagnosis not present

## 2017-02-01 DIAGNOSIS — M5414 Radiculopathy, thoracic region: Secondary | ICD-10-CM | POA: Diagnosis not present

## 2017-02-01 DIAGNOSIS — M9902 Segmental and somatic dysfunction of thoracic region: Secondary | ICD-10-CM | POA: Diagnosis not present

## 2017-02-01 DIAGNOSIS — R51 Headache: Secondary | ICD-10-CM | POA: Diagnosis not present

## 2017-02-03 DIAGNOSIS — R51 Headache: Secondary | ICD-10-CM | POA: Diagnosis not present

## 2017-02-03 DIAGNOSIS — M9902 Segmental and somatic dysfunction of thoracic region: Secondary | ICD-10-CM | POA: Diagnosis not present

## 2017-02-03 DIAGNOSIS — M9901 Segmental and somatic dysfunction of cervical region: Secondary | ICD-10-CM | POA: Diagnosis not present

## 2017-02-03 DIAGNOSIS — M5414 Radiculopathy, thoracic region: Secondary | ICD-10-CM | POA: Diagnosis not present

## 2017-02-08 DIAGNOSIS — M9901 Segmental and somatic dysfunction of cervical region: Secondary | ICD-10-CM | POA: Diagnosis not present

## 2017-02-08 DIAGNOSIS — M9902 Segmental and somatic dysfunction of thoracic region: Secondary | ICD-10-CM | POA: Diagnosis not present

## 2017-02-08 DIAGNOSIS — R51 Headache: Secondary | ICD-10-CM | POA: Diagnosis not present

## 2017-02-08 DIAGNOSIS — M5414 Radiculopathy, thoracic region: Secondary | ICD-10-CM | POA: Diagnosis not present

## 2017-02-10 DIAGNOSIS — M9901 Segmental and somatic dysfunction of cervical region: Secondary | ICD-10-CM | POA: Diagnosis not present

## 2017-02-10 DIAGNOSIS — R51 Headache: Secondary | ICD-10-CM | POA: Diagnosis not present

## 2017-02-10 DIAGNOSIS — M9902 Segmental and somatic dysfunction of thoracic region: Secondary | ICD-10-CM | POA: Diagnosis not present

## 2017-02-10 DIAGNOSIS — M5414 Radiculopathy, thoracic region: Secondary | ICD-10-CM | POA: Diagnosis not present

## 2017-02-15 DIAGNOSIS — R51 Headache: Secondary | ICD-10-CM | POA: Diagnosis not present

## 2017-02-15 DIAGNOSIS — M5414 Radiculopathy, thoracic region: Secondary | ICD-10-CM | POA: Diagnosis not present

## 2017-02-15 DIAGNOSIS — M9902 Segmental and somatic dysfunction of thoracic region: Secondary | ICD-10-CM | POA: Diagnosis not present

## 2017-02-15 DIAGNOSIS — M9901 Segmental and somatic dysfunction of cervical region: Secondary | ICD-10-CM | POA: Diagnosis not present

## 2017-02-19 DIAGNOSIS — M9902 Segmental and somatic dysfunction of thoracic region: Secondary | ICD-10-CM | POA: Diagnosis not present

## 2017-02-19 DIAGNOSIS — M9901 Segmental and somatic dysfunction of cervical region: Secondary | ICD-10-CM | POA: Diagnosis not present

## 2017-02-19 DIAGNOSIS — M5414 Radiculopathy, thoracic region: Secondary | ICD-10-CM | POA: Diagnosis not present

## 2017-02-19 DIAGNOSIS — R51 Headache: Secondary | ICD-10-CM | POA: Diagnosis not present

## 2017-02-22 DIAGNOSIS — M5414 Radiculopathy, thoracic region: Secondary | ICD-10-CM | POA: Diagnosis not present

## 2017-02-22 DIAGNOSIS — M9901 Segmental and somatic dysfunction of cervical region: Secondary | ICD-10-CM | POA: Diagnosis not present

## 2017-02-22 DIAGNOSIS — M9902 Segmental and somatic dysfunction of thoracic region: Secondary | ICD-10-CM | POA: Diagnosis not present

## 2017-02-22 DIAGNOSIS — R51 Headache: Secondary | ICD-10-CM | POA: Diagnosis not present

## 2017-03-01 DIAGNOSIS — R51 Headache: Secondary | ICD-10-CM | POA: Diagnosis not present

## 2017-03-01 DIAGNOSIS — M9901 Segmental and somatic dysfunction of cervical region: Secondary | ICD-10-CM | POA: Diagnosis not present

## 2017-03-01 DIAGNOSIS — M5414 Radiculopathy, thoracic region: Secondary | ICD-10-CM | POA: Diagnosis not present

## 2017-03-01 DIAGNOSIS — M9902 Segmental and somatic dysfunction of thoracic region: Secondary | ICD-10-CM | POA: Diagnosis not present

## 2017-03-08 DIAGNOSIS — R51 Headache: Secondary | ICD-10-CM | POA: Diagnosis not present

## 2017-03-08 DIAGNOSIS — M9901 Segmental and somatic dysfunction of cervical region: Secondary | ICD-10-CM | POA: Diagnosis not present

## 2017-03-08 DIAGNOSIS — M5414 Radiculopathy, thoracic region: Secondary | ICD-10-CM | POA: Diagnosis not present

## 2017-03-08 DIAGNOSIS — M9902 Segmental and somatic dysfunction of thoracic region: Secondary | ICD-10-CM | POA: Diagnosis not present

## 2017-03-11 ENCOUNTER — Ambulatory Visit: Payer: Medicare Other | Admitting: Internal Medicine

## 2017-03-15 ENCOUNTER — Ambulatory Visit: Payer: Medicare Other | Admitting: Internal Medicine

## 2017-03-23 DIAGNOSIS — M9901 Segmental and somatic dysfunction of cervical region: Secondary | ICD-10-CM | POA: Diagnosis not present

## 2017-03-23 DIAGNOSIS — M5414 Radiculopathy, thoracic region: Secondary | ICD-10-CM | POA: Diagnosis not present

## 2017-03-23 DIAGNOSIS — M9902 Segmental and somatic dysfunction of thoracic region: Secondary | ICD-10-CM | POA: Diagnosis not present

## 2017-03-23 DIAGNOSIS — R51 Headache: Secondary | ICD-10-CM | POA: Diagnosis not present

## 2017-03-29 DIAGNOSIS — R634 Abnormal weight loss: Secondary | ICD-10-CM | POA: Diagnosis not present

## 2017-03-29 DIAGNOSIS — I209 Angina pectoris, unspecified: Secondary | ICD-10-CM | POA: Diagnosis not present

## 2017-03-29 DIAGNOSIS — K219 Gastro-esophageal reflux disease without esophagitis: Secondary | ICD-10-CM | POA: Diagnosis not present

## 2017-03-29 DIAGNOSIS — I10 Essential (primary) hypertension: Secondary | ICD-10-CM | POA: Diagnosis not present

## 2017-03-29 DIAGNOSIS — R000 Tachycardia, unspecified: Secondary | ICD-10-CM | POA: Diagnosis not present

## 2017-03-29 DIAGNOSIS — I2510 Atherosclerotic heart disease of native coronary artery without angina pectoris: Secondary | ICD-10-CM | POA: Diagnosis not present

## 2017-03-29 DIAGNOSIS — R0602 Shortness of breath: Secondary | ICD-10-CM | POA: Diagnosis not present

## 2017-03-29 DIAGNOSIS — J449 Chronic obstructive pulmonary disease, unspecified: Secondary | ICD-10-CM | POA: Diagnosis not present

## 2017-03-29 DIAGNOSIS — R002 Palpitations: Secondary | ICD-10-CM | POA: Diagnosis not present

## 2017-03-29 DIAGNOSIS — E669 Obesity, unspecified: Secondary | ICD-10-CM | POA: Diagnosis not present

## 2017-03-29 DIAGNOSIS — I5042 Chronic combined systolic (congestive) and diastolic (congestive) heart failure: Secondary | ICD-10-CM | POA: Diagnosis not present

## 2017-03-29 DIAGNOSIS — E782 Mixed hyperlipidemia: Secondary | ICD-10-CM | POA: Diagnosis not present

## 2017-04-06 DIAGNOSIS — M5414 Radiculopathy, thoracic region: Secondary | ICD-10-CM | POA: Diagnosis not present

## 2017-04-06 DIAGNOSIS — R51 Headache: Secondary | ICD-10-CM | POA: Diagnosis not present

## 2017-04-06 DIAGNOSIS — M9902 Segmental and somatic dysfunction of thoracic region: Secondary | ICD-10-CM | POA: Diagnosis not present

## 2017-04-06 DIAGNOSIS — M9901 Segmental and somatic dysfunction of cervical region: Secondary | ICD-10-CM | POA: Diagnosis not present

## 2017-04-26 ENCOUNTER — Other Ambulatory Visit: Payer: Self-pay | Admitting: Internal Medicine

## 2017-04-26 NOTE — Telephone Encounter (Signed)
Refilled: 09/10/2016 Last OV: 09/10/2016 Next OV: 06/08/2017

## 2017-04-26 NOTE — Telephone Encounter (Signed)
Refilled for two months until office visit

## 2017-04-27 ENCOUNTER — Other Ambulatory Visit: Payer: Self-pay | Admitting: Internal Medicine

## 2017-04-27 DIAGNOSIS — M5414 Radiculopathy, thoracic region: Secondary | ICD-10-CM | POA: Diagnosis not present

## 2017-04-27 DIAGNOSIS — R51 Headache: Secondary | ICD-10-CM | POA: Diagnosis not present

## 2017-04-27 DIAGNOSIS — M9902 Segmental and somatic dysfunction of thoracic region: Secondary | ICD-10-CM | POA: Diagnosis not present

## 2017-04-27 DIAGNOSIS — M9901 Segmental and somatic dysfunction of cervical region: Secondary | ICD-10-CM | POA: Diagnosis not present

## 2017-04-27 NOTE — Telephone Encounter (Signed)
Already refilled on 04/26/2017.

## 2017-04-27 NOTE — Telephone Encounter (Signed)
Rx faxed to pharmacy  

## 2017-05-18 DIAGNOSIS — R51 Headache: Secondary | ICD-10-CM | POA: Diagnosis not present

## 2017-05-18 DIAGNOSIS — M9902 Segmental and somatic dysfunction of thoracic region: Secondary | ICD-10-CM | POA: Diagnosis not present

## 2017-05-18 DIAGNOSIS — M5414 Radiculopathy, thoracic region: Secondary | ICD-10-CM | POA: Diagnosis not present

## 2017-05-18 DIAGNOSIS — M9901 Segmental and somatic dysfunction of cervical region: Secondary | ICD-10-CM | POA: Diagnosis not present

## 2017-06-03 DIAGNOSIS — J439 Emphysema, unspecified: Secondary | ICD-10-CM | POA: Diagnosis not present

## 2017-06-03 DIAGNOSIS — R0602 Shortness of breath: Secondary | ICD-10-CM | POA: Diagnosis not present

## 2017-06-03 DIAGNOSIS — J986 Disorders of diaphragm: Secondary | ICD-10-CM | POA: Diagnosis not present

## 2017-06-08 ENCOUNTER — Ambulatory Visit (INDEPENDENT_AMBULATORY_CARE_PROVIDER_SITE_OTHER): Payer: Medicare Other

## 2017-06-08 ENCOUNTER — Encounter: Payer: Self-pay | Admitting: Internal Medicine

## 2017-06-08 VITALS — BP 140/78 | HR 78 | Temp 98.4°F | Resp 16 | Ht 63.0 in | Wt 176.4 lb

## 2017-06-08 DIAGNOSIS — Z0000 Encounter for general adult medical examination without abnormal findings: Secondary | ICD-10-CM

## 2017-06-08 DIAGNOSIS — Z1239 Encounter for other screening for malignant neoplasm of breast: Secondary | ICD-10-CM

## 2017-06-08 DIAGNOSIS — E785 Hyperlipidemia, unspecified: Secondary | ICD-10-CM

## 2017-06-08 DIAGNOSIS — E1169 Type 2 diabetes mellitus with other specified complication: Secondary | ICD-10-CM | POA: Diagnosis not present

## 2017-06-08 DIAGNOSIS — Z1231 Encounter for screening mammogram for malignant neoplasm of breast: Secondary | ICD-10-CM

## 2017-06-08 DIAGNOSIS — Z23 Encounter for immunization: Secondary | ICD-10-CM | POA: Diagnosis not present

## 2017-06-08 DIAGNOSIS — Z1331 Encounter for screening for depression: Secondary | ICD-10-CM

## 2017-06-08 DIAGNOSIS — E119 Type 2 diabetes mellitus without complications: Secondary | ICD-10-CM | POA: Diagnosis not present

## 2017-06-08 DIAGNOSIS — Z79899 Other long term (current) drug therapy: Secondary | ICD-10-CM | POA: Diagnosis not present

## 2017-06-08 DIAGNOSIS — Z1389 Encounter for screening for other disorder: Secondary | ICD-10-CM | POA: Diagnosis not present

## 2017-06-08 LAB — LIPID PANEL
CHOLESTEROL: 193 mg/dL (ref 0–200)
HDL: 36.4 mg/dL — ABNORMAL LOW (ref >39.00)
LDL Cholesterol: 124 mg/dL — ABNORMAL HIGH (ref 0–99)
NONHDL: 156.32
Total CHOL/HDL Ratio: 5
Triglycerides: 163 mg/dL — ABNORMAL HIGH (ref 0.0–149.0)
VLDL: 32.6 mg/dL (ref 0.0–40.0)

## 2017-06-08 LAB — COMPREHENSIVE METABOLIC PANEL
ALK PHOS: 53 U/L (ref 39–117)
ALT: 36 U/L — ABNORMAL HIGH (ref 0–35)
AST: 23 U/L (ref 0–37)
Albumin: 4.4 g/dL (ref 3.5–5.2)
BUN: 14 mg/dL (ref 6–23)
CHLORIDE: 102 meq/L (ref 96–112)
CO2: 30 mEq/L (ref 19–32)
CREATININE: 0.67 mg/dL (ref 0.40–1.20)
Calcium: 9.5 mg/dL (ref 8.4–10.5)
GFR: 91.85 mL/min (ref >60.00)
GLUCOSE: 107 mg/dL — AB (ref 70–99)
POTASSIUM: 4.2 meq/L (ref 3.5–5.1)
SODIUM: 141 meq/L (ref 135–145)
TOTAL PROTEIN: 7.3 g/dL (ref 6.0–8.3)
Total Bilirubin: 0.7 mg/dL (ref 0.2–1.2)

## 2017-06-08 LAB — VITAMIN B12: Vitamin B-12: 611 pg/mL (ref 211–911)

## 2017-06-08 LAB — HEMOGLOBIN A1C: HEMOGLOBIN A1C: 6.3 % (ref 4.6–6.5)

## 2017-06-08 LAB — LDL CHOLESTEROL, DIRECT: Direct LDL: 142 mg/dL

## 2017-06-08 NOTE — Progress Notes (Signed)
Subjective:   Stacie Mcdonald is a 71 y.o. female who presents for Medicare Annual (Subsequent) preventive examination.  Review of Systems:   Cardiac Risk Factors include: advanced age (>45men, >6 women);hypertension;diabetes mellitus     Objective:     Vitals: BP 140/78 (BP Location: Left Arm, Patient Position: Sitting, Cuff Size: Normal)   Pulse 78   Temp 98.4 F (36.9 C) (Oral)   Resp 16   Ht 5\' 3"  (1.6 m)   Wt 176 lb 6.4 oz (80 kg)   SpO2 95%   BMI 31.25 kg/m   Body mass index is 31.25 kg/m.   Tobacco History  Smoking Status  . Never Smoker  Smokeless Tobacco  . Never Used     Counseling given: Not Answered   Past Medical History:  Diagnosis Date  . Allergy   . Arthritis   . Asthma   . Breast Cancer 1991   R mastectomy. XRT , chemo  . Breast cancer ([place]) 1991   RT MASTECTOMY  . CAD (coronary artery disease) 2008   50% LAD occlusion 2009,  stent in mid LAD 2008 for 95%   . Chicken pox   . Colon polyps April 2009   by Colonoscopy, Gustavo Lah  . GERD (gastroesophageal reflux disease)   . Heart disease   . Hyperlipidemia   . Hypertension   . 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 Rheumatic fever   . Urinary tract infection    Past Surgical History:  Procedure Laterality Date  . BREAST BIOPSY     right breast reconstruction S/P mastectomy  . BREAST SURGERY     right mastectomy  . CORONARY ANGIOPLASTY WITH STENT PLACEMENT  Jan 2013   mid RCA 75% occlusion resolved to 0%, [place]  . MASTECTOMY Right 1991   BREAST CA  . TONSILLECTOMY     Family History  Problem Relation Age of Onset  . Heart disease Mother 37       massive MI  . Heart disease Father 10       AMI  . Cancer Father        metastatic lung CA  . Diabetes Sister   . Cancer Maternal Aunt   . Breast cancer Maternal Aunt 50   History  Sexual Activity  . Sexual activity: Yes    Outpatient Encounter Prescriptions as of 06/08/2017  Medication Sig  .  ALPRAZolam (XANAX) 0.5 MG tablet TAKE 1 TABLET AT BEDTIME AS NEEDED FOR ANXIETY.  Marland Kitchen aspirin 81 MG tablet Take 81 mg by mouth daily.   . clopidogrel (PLAVIX) 75 MG tablet TAKE ONE TABLET BY MOUTH DAILY  . digoxin (LANOXIN) 0.125 MG tablet Take 0.125 mg by mouth daily.  . furosemide (LASIX) 20 MG tablet Take 1 tablet (20 mg total) by mouth daily. (Patient taking differently: Take 40 mg by mouth daily. )  . isosorbide mononitrate (IMDUR) 30 MG 24 hr tablet Take 2 tablets (60 mg total) by mouth daily.  . Lancets (FREESTYLE) lancets uSE TO CHECK SUGARS ONCE DAILY e11.9  . losartan (COZAAR) 100 MG tablet TAKE 1 TABLET (100 MG TOTAL) BY MOUTH DAILY.  . metoprolol (TOPROL-XL) 200 MG 24 hr tablet TAKE 1 TABLET BY MOUTH DAILY.  . niacin 500 MG tablet Take 1 tablet (500 mg total) by mouth daily.  . nitrofurantoin (MACRODANTIN) 100 MG capsule Take 1 capsule (100 mg total) by mouth daily.  Marland Kitchen omeprazole (PRILOSEC) 20 MG capsule Take 1  capsule (20 mg total) by mouth daily.  . ONE TOUCH ULTRA TEST test strip CHECK DAILY  . ONETOUCH DELICA LANCETS 54Y MISC CHECK DAILY  . potassium chloride SA (K-DUR,KLOR-CON) 20 MEQ tablet Take 1 tablet (20 mEq total) by mouth daily.   No facility-administered encounter medications on file as of 06/08/2017.     Activities of Daily Living In your present state of health, do you have any difficulty performing the following activities: 06/08/2017 06/08/2016  Hearing? N N  Vision? N N  Difficulty concentrating or making decisions? N N  Walking or climbing stairs? Y Y  Dressing or bathing? N N  Doing errands, shopping? N N  Preparing Food and eating ? N -  Using the Toilet? N -  In the past six months, have you accidently leaked urine? N -  Do you have problems with loss of bowel control? N -  Managing your Medications? N -  Managing your Finances? N -  Housekeeping or managing your Housekeeping? N -  Some recent data might be hidden    Patient Care Team: Crecencio Mc, MD as PCP - General (Internal Medicine)    Assessment:    This is a routine wellness examination for Stacie Mcdonald. The goal of the wellness visit is to assist the patient how to close the gaps in care and create a preventative care plan for the patient.   The roster of all physicians providing medical care to patient is listed in the Snapshot section of the chart.  Osteoporosis risk reviewed.    Safety issues reviewed; Smoke and carbon monoxide detectors in the home. No firearms in the home.  Wears seatbelts when driving or riding with others. Patient does wear sunscreen or protective clothing when in direct sunlight. No violence in the home.  Depression- PHQ 2 &9 complete.  No signs/symptoms or verbal communication regarding little pleasure in doing things, feeling down, depressed or hopeless. No changes in sleeping, energy, eating, concentrating.  No thoughts of self harm or harm towards others.  Time spent on this topic is 10 minutes.   Patient is alert, normal appearance, oriented to person/place/and time.  Correctly identified the president of the [place], recall of 3/3 words, and performing simple calculations. Displays appropriate judgement and can read correct time from watch face.   No new identified risk were noted.  No failures at ADL's or IADL's.    BMI- discussed the importance of a healthy diet, water intake and the benefits of aerobic exercise. Educational material provided.   24 hour diet recall: Breakfast: Eggs, omellete, toast Lunch: Fruit, crackers, cheese Dinner: [place] sausage, green vegetable Snack: Grapefruit Daily fluid intake: 2 cups of caffeine, 4-6 cups of water  Dental- every 6 months.  Dr. Norman Herrlich  Eye- Visual acuity not assessed per patient preference since they have regular follow up with the ophthalmologist.  Wears corrective lenses.  Sleep patterns- Sleeps 5-6 hours at night.  Wakes feeling rested.   Pneumococcal 23 vaccine  Administered L deltoid,  tolerated well. Educational material provided.  Mammogram ordered per patient request.    Patient Concerns: None at this time. Follow up with PCP as needed.  Exercise Activities and Dietary recommendations Current Exercise Habits: Home exercise routine, Type of exercise: strength training/weights;stretching;calisthenics, Time (Minutes): 60, Frequency (Times/Week): 4, Weekly Exercise (Minutes/Week): 240, Intensity: Moderate  Goals    . Increase lean proteins          Low carb foods      Fall Risk  Fall Risk  06/08/2017 06/08/2016 02/11/2015 12/05/2012  Falls in the past year? No No No No   Depression Screen PHQ 2/9 Scores 06/08/2017 06/08/2016 02/11/2015 12/05/2012  PHQ - 2 Score 0 0 0 0  PHQ- 9 Score 0 - - -     Cognitive Function MMSE - Mini Mental State Exam 06/08/2017  Orientation to time 5  Orientation to Place 5  Registration 3  Attention/ Calculation 5  Recall 3  Language- name 2 objects 2  Language- repeat 1  Language- follow 3 step command 3  Language- read & follow direction 1  Write a sentence 1  Copy design 1  Total score 30        Immunization History  Administered Date(s) Administered  . Influenza Split 09/03/2013, 08/01/2014  . Influenza-Unspecified 08/16/2012  . Pneumococcal Conjugate-13 02/13/2015  . Pneumococcal Polysaccharide-23 12/05/2009, 06/08/2017  . Td 03/13/2010  . Tdap 12/06/2003   Screening Tests Health Maintenance  Topic Date Due  . OPHTHALMOLOGY EXAM  02/11/2015  . PNA vac Low Risk Adult (2 of 2 - PPSV23) 02/13/2016  . HEMOGLOBIN A1C  06/14/2017  . INFLUENZA VACCINE  06/16/2017  . FOOT EXAM  09/10/2017  . MAMMOGRAM  07/07/2018  . TETANUS/TDAP  03/13/2020  . COLONOSCOPY  04/12/2024  . DEXA SCAN  Completed  . Hepatitis C Screening  Completed      Plan:    End of life planning; Advance aging; Advanced directives discussed. Copy of current HCPOA/Living Will requested.    I have personally reviewed and noted the following in the  patient's chart:   . Medical and social history . Use of alcohol, tobacco or illicit drugs  . Current medications and supplements . Functional ability and status . Nutritional status . Physical activity . Advanced directives . List of other physicians . Hospitalizations, surgeries, and ER visits in previous 12 months . Vitals . Screenings to include cognitive, depression, and falls . Referrals and appointments  In addition, I have reviewed and discussed with patient certain preventive protocols, quality metrics, and best practice recommendations. A written personalized care plan for preventive services as well as general preventive health recommendations were provided to patient.     Varney Biles, LPN  05/07/6332

## 2017-06-08 NOTE — Patient Instructions (Addendum)
  Ms. Stacie Mcdonald , Thank you for taking time to come for your Medicare Wellness Visit. I appreciate your ongoing commitment to your health goals. Please review the following plan we discussed and let me know if I can assist you in the future.   Follow up with Dr. Derrel Nip as needed.    Bring a copy of your [facility] and/or Living Will to be scanned into chart.  Have a great day!  These are the goals we discussed: Goals    . Increase lean proteins          Low carb foods       This is a list of the screening recommended for you and due dates:  Health Maintenance  Topic Date Due  . Eye exam for diabetics  02/11/2015  . Pneumonia vaccines (2 of 2 - PPSV23) 02/13/2016  . Hemoglobin A1C  06/14/2017  . Flu Shot  06/16/2017  . Complete foot exam   09/10/2017  . Mammogram  07/07/2018  . Tetanus Vaccine  03/13/2020  . Colon Cancer Screening  04/12/2024  . DEXA scan (bone density measurement)  Completed  .  Hepatitis C: One time screening is recommended by Center for Disease Control  (CDC) for  adults born from [birth year] through 1966.   Completed

## 2017-06-08 NOTE — Progress Notes (Signed)
Care was provided under my supervision. I agree with the management as indicated in the note.  Polette Nofsinger DO  

## 2017-06-17 ENCOUNTER — Encounter: Payer: Self-pay | Admitting: Internal Medicine

## 2017-06-17 ENCOUNTER — Ambulatory Visit (INDEPENDENT_AMBULATORY_CARE_PROVIDER_SITE_OTHER): Payer: Medicare Other | Admitting: Internal Medicine

## 2017-06-17 DIAGNOSIS — B3789 Other sites of candidiasis: Secondary | ICD-10-CM | POA: Diagnosis not present

## 2017-06-17 DIAGNOSIS — E119 Type 2 diabetes mellitus without complications: Secondary | ICD-10-CM | POA: Diagnosis not present

## 2017-06-17 DIAGNOSIS — E785 Hyperlipidemia, unspecified: Secondary | ICD-10-CM

## 2017-06-17 DIAGNOSIS — L247 Irritant contact dermatitis due to plants, except food: Secondary | ICD-10-CM | POA: Diagnosis not present

## 2017-06-17 DIAGNOSIS — E113393 Type 2 diabetes mellitus with moderate nonproliferative diabetic retinopathy without macular edema, bilateral: Secondary | ICD-10-CM | POA: Diagnosis not present

## 2017-06-17 LAB — HM DIABETES EYE EXAM

## 2017-06-17 MED ORDER — ALPRAZOLAM 0.5 MG PO TABS: ORAL_TABLET | ORAL | 3 refills | Status: DC

## 2017-06-17 MED ORDER — NYSTATIN 100000 UNIT/GM EX POWD: Freq: Two times a day (BID) | CUTANEOUS | 0 refills | Status: DC

## 2017-06-17 MED ORDER — PREDNISONE 20 MG PO TABS: 20.0000 mg | ORAL_TABLET | Freq: Every day | ORAL | 0 refills | Status: DC

## 2017-06-17 NOTE — Patient Instructions (Addendum)
Talk to Dr Jacqlyn Larsen about changing your macrobid to an altenrative antibiotic   I have prescribed Nystatin powder for your groin rash   I have also prescribed prednisone 20 mg x 5 days for your poison ivy   You cholesterol is above goal given your heart history  There is an alternative to statin therapy for management of hyperlipidemia.  It is called Repatha,  And it has a very low side effect profile because of its mechanism of action.  It is in a class of drugs called a PCSK9 inhibitor.   Repatha is a monoclonal human antibody that binds to the body's natural inhibitor of the LDL receptors (PCSK9) , preventing the inhibitor from inhibiting!  This effectively increases  the number of LDL receptors available to clear the bad cholesterol (LDL)  from the bloodstream.  It is injected into the skin (at home)  twice per month . The data of its efficacy is very good.  If you would be interested in trying it,  I will try to get it authorized

## 2017-06-17 NOTE — Progress Notes (Signed)
Subjective:  Patient ID: Stacie Mcdonald, female    DOB: 09-15-1946  Age: 71 y.o. MRN: [id number]  CC: Diagnoses of Candida rash of groin, Hyperlipidemia with target low density lipoprotein (LDL) cholesterol less than 70 mg/dL, Irritant contact dermatitis due to plant, and Diabetes mellitus without complication ([place]) were pertinent to this visit.  HPI Stacie Mcdonald presents for 6 month follow up on diabetes and other chronic issues .  Patient has no complaints today.  Patient is following a low glycemic index diet and taking all prescribed medications regularly without side effects.  Fasting sugars have been under less than 140 most of the time and post prandials have been under 160 except on rare occasions. Patient is exercising about 3 times per week and intentionally trying to lose weight .  Patient has had an eye exam in the last 12 months and checks feet regularly for signs of infection.  Patient does not walk barefoot outside,  And denies an numbness tingling or burning in feet. Patient is up to date on all recommended vaccinations  Eye exam today by Dr Gloriann Loan   Cc: insomnia  Managed with alprazolam. Husband is sick. History of colon  and prostate cancer  Now diagnosed with cirrhosis ,  CEA is elevated , needs a pacemaker,   Patient has restrictive lung disease,  Also taking daily macrobid for the past several years for suppression of recurrent IT , started by Dr Jacqlyn Larsen . Advised to discuss with him, re altERNATIVE MEDS.   Poison ivy on wrists and forearms two  weeks ago Yeast infection in groin for 2 weeks   Right shoulder improving with massage and chiropractor,  Numbness runs all the way into her hand with pain   Health maintenance   History of BRCA s/p R mastectomy due rfor L mammogram now  DEXA 2017 Colonoscopy 2015 [facility].  Moderate restrictive lung disease unchanged  Right hemodiaphragm paralysis CAD: cardiac cath 2008 ptcs sten in LAD . Statin  intolerance due to severe myalgias.   myoview April 2017 borderline normal    Lab Results  Component Value Date   HGBA1C 6.3 06/08/2017        Outpatient Medications Prior to Visit  Medication Sig Dispense Refill  . aspirin 81 MG tablet Take 81 mg by mouth daily.     . clopidogrel (PLAVIX) 75 MG tablet TAKE ONE TABLET BY MOUTH DAILY 90 tablet 1  . digoxin (LANOXIN) 0.125 MG tablet Take 0.125 mg by mouth daily.    . furosemide (LASIX) 20 MG tablet Take 1 tablet (20 mg total) by mouth daily. (Patient taking differently: Take 40 mg by mouth daily. ) 30 tablet 3  . isosorbide mononitrate (IMDUR) 30 MG 24 hr tablet Take 2 tablets (60 mg total) by mouth daily. (Patient taking differently: Take 30 mg by mouth daily. ) 30 tablet 11  . Lancets (FREESTYLE) lancets uSE TO CHECK SUGARS ONCE DAILY e11.9 100 each 3  . losartan (COZAAR) 100 MG tablet TAKE 1 TABLET (100 MG TOTAL) BY MOUTH DAILY. 90 tablet 1  . metoprolol (TOPROL-XL) 200 MG 24 hr tablet TAKE 1 TABLET BY MOUTH DAILY. 90 tablet 1  . niacin 500 MG tablet Take 1 tablet (500 mg total) by mouth daily. 90 tablet 1  . nitrofurantoin (MACRODANTIN) 100 MG capsule Take 1 capsule (100 mg total) by mouth daily. 90 capsule 1  . omeprazole (PRILOSEC) 20 MG capsule Take 1 capsule (20 mg total) by  mouth daily. 90 capsule 1  . ONE TOUCH ULTRA TEST test strip CHECK DAILY 100 each 2  . ONETOUCH DELICA LANCETS 53G MISC CHECK DAILY 100 each 2  . potassium chloride SA (K-DUR,KLOR-CON) 20 MEQ tablet Take 1 tablet (20 mEq total) by mouth daily. 30 tablet 3  . ALPRAZolam (XANAX) 0.5 MG tablet TAKE 1 TABLET AT BEDTIME AS NEEDED FOR ANXIETY. 30 tablet 1   No facility-administered medications prior to visit.     Review of Systems;  Patient denies headache, fevers, malaise, unintentional weight loss, skin rash, eye pain, sinus congestion and sinus pain, sore throat, dysphagia,  hemoptysis , cough, dyspnea, wheezing, chest pain, palpitations, orthopnea, edema,  abdominal pain, nausea, melena, diarrhea, constipation, flank pain, dysuria, hematuria, urinary  Frequency, nocturia, numbness, tingling, seizures,  Focal weakness, Loss of consciousness,  Tremor, insomnia, depression, anxiety, and suicidal ideation.      Objective:  BP (!) 148/80 (BP Location: Left Arm, Patient Position: Sitting, Cuff Size: Large)   Pulse 80   Temp 99 F (37.2 C) (Oral)   Resp 15   Ht _0  (1.6 m)   Wt 176 lb (79.8 kg)   SpO2 94%   BMI 31.18 kg/m   BP Readings from Last 3 Encounters:  06/17/17 (!) 148/80  06/08/17 140/78  09/10/16 128/70    Wt Readings from Last 3 Encounters:  06/17/17 176 lb (79.8 kg)  06/08/17 176 lb 6.4 oz (80 kg)  09/10/16 175 lb (79.4 kg)    General appearance: alert, cooperative and appears stated age Ears: normal TM's and external ear canals both ears Throat: lips, mucosa, and tongue normal; teeth and gums normal Neck: no adenopathy, no carotid bruit, supple, symmetrical, trachea midline and thyroid not enlarged, symmetric, no tenderness/mass/nodules Back: symmetric, no curvature. ROM normal. No CVA tenderness. Lungs: clear to auscultation bilaterally Heart: regular rate and rhythm, S1, S2 normal, no murmur, click, rub or gallop Abdomen: soft, non-tender; bowel sounds normal; no masses,  no organomegaly Pulses: 2+ and symmetric Skin: Skin color, texture, turgor normal. No rashes or lesions Lymph nodes: Cervical, supraclavicular, and axillary nodes normal.  Lab Results  Component Value Date   HGBA1C 6.3 06/08/2017   HGBA1C 6.5 12/15/2016   HGBA1C 6.5 06/15/2016    Lab Results  Component Value Date   CREATININE 0.67 06/08/2017   CREATININE 0.50 12/15/2016   CREATININE 0.55 06/15/2016    Lab Results  Component Value Date   WBC 7.1 06/15/2016   HGB 13.3 06/15/2016   HCT 39.8 06/15/2016   PLT 229.0 06/15/2016   GLUCOSE 107 (H) 06/08/2017   CHOL 193 06/08/2017   TRIG 163.0 (H) 06/08/2017   HDL 36.40 (L) 06/08/2017    LDLDIRECT 142.0 06/08/2017   LDLCALC 124 (H) 06/08/2017   ALT 36 (H) 06/08/2017   AST 23 06/08/2017   NA 141 06/08/2017   K 4.2 06/08/2017   CL 102 06/08/2017   CREATININE 0.67 06/08/2017   BUN 14 06/08/2017   CO2 30 06/08/2017   TSH 4.08 02/15/2015   INR 1.0 09/27/2012   HGBA1C 6.3 06/08/2017   MICROALBUR 1.7 06/15/2016    Dg Sniff Test  Result Date: 11/26/2016 CLINICAL DATA:  Shortness of breath EXAM: CHEST FLUOROSCOPY TECHNIQUE: Real-time fluoroscopic evaluation of the chest was performed. FLUOROSCOPY TIME:  Fluoroscopy Time:  30 seconds Radiation Exposure Index (if provided by the fluoroscopic device): 868 micro Gy per meters squared Number of Acquired Spot Images: 1 video loop COMPARISON:  Report of a chest x-ray  of November 20, 2015 FINDINGS: At rest the right hemidiaphragm is higher than the left. With quiet breathing there is no significant movement of the right hemidiaphragm. With a cough or the sniff maneuvers there is paradoxical motion of the right hemidiaphragm. Left hemidiaphragmatic motion is normal. IMPRESSION: Paradoxical motion of the elevated right hemidiaphragm with respiratory maneuvers. This is consistent with right hemidiaphragm paralysis. Electronically Signed   By: David  Mcdonald M.D.   On: 11/26/2016 08:59    Assessment & Plan:   Problem List Items Addressed This Visit    Irritant contact dermatitis due to plant    Poison ivy,  Affecting both wrists. prednisone taper , steroid cream.       Hyperlipidemia with target low density lipoprotein (LDL) cholesterol less than 70 mg/dL    She is statin intolerant. Discussed alternative therpay with  Repatha.   Lab Results  Component Value Date   CHOL 193 06/08/2017   HDL 36.40 (L) 06/08/2017   LDLCALC 124 (H) 06/08/2017   LDLDIRECT 142.0 06/08/2017   TRIG 163.0 (H) 06/08/2017   CHOLHDL 5 06/08/2017   Lab Results  Component Value Date   ALT 36 (H) 06/08/2017   AST 23 06/08/2017   ALKPHOS 53 06/08/2017    BILITOT 0.7 06/08/2017          Relevant Medications   nitroGLYCERIN (NITROSTAT) 0.4 MG SL tablet   Diabetes mellitus without complication ([facility])    Historically well-controlled on carbohydrate restricted diet.   hemoglobin A1c has been consistently at or  less than 7.0 . Patient is up-to-date on eye exams and foot exam is normal today. Patient is due for urine microalbumin to creatinine ratio at next visit. Patient is intolerant of  statin therapy for CAD risk reduction.  She is taking  ACE/ARB for reduction in proteinuria.    Lab Results  Component Value Date   HGBA1C 6.3 06/08/2017   Lab Results  Component Value Date   CHOL 193 06/08/2017   HDL 36.40 (L) 06/08/2017   LDLCALC 124 (H) 06/08/2017   LDLDIRECT 142.0 06/08/2017   TRIG 163.0 (H) 06/08/2017   CHOLHDL 5 06/08/2017   Lab Results  Component Value Date   MICROALBUR 1.7 06/15/2016          Candida rash of groin    Nystatin powder bid. Followed by Raquel James      Relevant Medications   nystatin (MYCOSTATIN/NYSTOP) powder      I am having [name] start on predniSONE and nystatin. I am also having her maintain her aspirin, isosorbide mononitrate, clopidogrel, furosemide, potassium chloride SA, ONE TOUCH ULTRA TEST, ONETOUCH DELICA LANCETS 64P, losartan, niacin, metoprolol, omeprazole, nitrofurantoin, digoxin, freestyle, nitroGLYCERIN, and ALPRAZolam.  Meds ordered this encounter  Medications  . nitroGLYCERIN (NITROSTAT) 0.4 MG SL tablet    Sig: Place under the tongue.  . predniSONE (DELTASONE) 20 MG tablet    Sig: Take 1 tablet (20 mg total) by mouth daily with breakfast.    Dispense:  5 tablet    Refill:  0  . nystatin (MYCOSTATIN/NYSTOP) powder    Sig: Apply topically 2 (two) times daily.    Dispense:  15 g    Refill:  0  . ALPRAZolam (XANAX) 0.5 MG tablet    Sig: TAKE 1 TABLET AT BEDTIME AS NEEDED FOR ANXIETY.    Dispense:  30 tablet    Refill:  3    Medications Discontinued During This Encounter    Medication Reason  . ALPRAZolam (XANAX) 0.5  MG tablet Reorder    Follow-up: No Follow-up on file.   Crecencio Mc, MD

## 2017-06-19 DIAGNOSIS — B3789 Other sites of candidiasis: Secondary | ICD-10-CM | POA: Insufficient documentation

## 2017-06-19 DIAGNOSIS — L247 Irritant contact dermatitis due to plants, except food: Secondary | ICD-10-CM | POA: Insufficient documentation

## 2017-06-19 NOTE — Assessment & Plan Note (Signed)
Nystatin powder bid. Followed by Raquel James

## 2017-06-19 NOTE — Assessment & Plan Note (Signed)
Poison ivy,  Affecting both wrists. prednisone taper , steroid cream.

## 2017-06-19 NOTE — Assessment & Plan Note (Signed)
Historically well-controlled on carbohydrate restricted diet.   hemoglobin A1c has been consistently at or  less than 7.0 . Patient is up-to-date on eye exams and foot exam is normal today. Patient is due for urine microalbumin to creatinine ratio at next visit. Patient is intolerant of  statin therapy for CAD risk reduction.  She is taking  ACE/ARB for reduction in proteinuria.    Lab Results  Component Value Date   HGBA1C 6.3 06/08/2017   Lab Results  Component Value Date   CHOL 193 06/08/2017   HDL 36.40 (L) 06/08/2017   LDLCALC 124 (H) 06/08/2017   LDLDIRECT 142.0 06/08/2017   TRIG 163.0 (H) 06/08/2017   CHOLHDL 5 06/08/2017   Lab Results  Component Value Date   MICROALBUR 1.7 06/15/2016

## 2017-06-19 NOTE — Assessment & Plan Note (Signed)
She is statin intolerant. Discussed alternative therpay with  Repatha.   Lab Results  Component Value Date   CHOL 193 06/08/2017   HDL 36.40 (L) 06/08/2017   LDLCALC 124 (H) 06/08/2017   LDLDIRECT 142.0 06/08/2017   TRIG 163.0 (H) 06/08/2017   CHOLHDL 5 06/08/2017   Lab Results  Component Value Date   ALT 36 (H) 06/08/2017   AST 23 06/08/2017   ALKPHOS 53 06/08/2017   BILITOT 0.7 06/08/2017

## 2017-06-22 DIAGNOSIS — R51 Headache: Secondary | ICD-10-CM | POA: Diagnosis not present

## 2017-06-22 DIAGNOSIS — M9902 Segmental and somatic dysfunction of thoracic region: Secondary | ICD-10-CM | POA: Diagnosis not present

## 2017-06-22 DIAGNOSIS — M5414 Radiculopathy, thoracic region: Secondary | ICD-10-CM | POA: Diagnosis not present

## 2017-06-22 DIAGNOSIS — M9901 Segmental and somatic dysfunction of cervical region: Secondary | ICD-10-CM | POA: Diagnosis not present

## 2017-07-05 ENCOUNTER — Other Ambulatory Visit: Payer: Self-pay | Admitting: Internal Medicine

## 2017-07-05 DIAGNOSIS — Z1231 Encounter for screening mammogram for malignant neoplasm of breast: Secondary | ICD-10-CM

## 2017-07-13 ENCOUNTER — Ambulatory Visit
Admission: RE | Admit: 2017-07-13 | Discharge: 2017-07-13 | Disposition: A | Discharge status: Home / Self Care | Payer: Medicare Other | Source: Ambulatory Visit | Attending: Internal Medicine | Admitting: Internal Medicine

## 2017-07-13 DIAGNOSIS — Z1231 Encounter for screening mammogram for malignant neoplasm of breast: Secondary | ICD-10-CM | POA: Diagnosis not present

## 2017-07-20 DIAGNOSIS — M9901 Segmental and somatic dysfunction of cervical region: Secondary | ICD-10-CM | POA: Diagnosis not present

## 2017-07-20 DIAGNOSIS — R51 Headache: Secondary | ICD-10-CM | POA: Diagnosis not present

## 2017-07-20 DIAGNOSIS — M5414 Radiculopathy, thoracic region: Secondary | ICD-10-CM | POA: Diagnosis not present

## 2017-07-20 DIAGNOSIS — M9902 Segmental and somatic dysfunction of thoracic region: Secondary | ICD-10-CM | POA: Diagnosis not present

## 2017-08-11 NOTE — Telephone Encounter (Signed)
Error

## 2017-08-16 NOTE — Telephone Encounter (Signed)
Error

## 2017-08-17 DIAGNOSIS — M5414 Radiculopathy, thoracic region: Secondary | ICD-10-CM | POA: Diagnosis not present

## 2017-08-17 DIAGNOSIS — M9901 Segmental and somatic dysfunction of cervical region: Secondary | ICD-10-CM | POA: Diagnosis not present

## 2017-08-17 DIAGNOSIS — R51 Headache: Secondary | ICD-10-CM | POA: Diagnosis not present

## 2017-08-17 DIAGNOSIS — M9902 Segmental and somatic dysfunction of thoracic region: Secondary | ICD-10-CM | POA: Diagnosis not present

## 2017-09-03 ENCOUNTER — Ambulatory Visit (INDEPENDENT_AMBULATORY_CARE_PROVIDER_SITE_OTHER): Payer: Medicare Other | Admitting: *Deleted

## 2017-09-03 DIAGNOSIS — Z23 Encounter for immunization: Secondary | ICD-10-CM

## 2017-09-21 DIAGNOSIS — M5414 Radiculopathy, thoracic region: Secondary | ICD-10-CM | POA: Diagnosis not present

## 2017-09-21 DIAGNOSIS — M9902 Segmental and somatic dysfunction of thoracic region: Secondary | ICD-10-CM | POA: Diagnosis not present

## 2017-09-21 DIAGNOSIS — R51 Headache: Secondary | ICD-10-CM | POA: Diagnosis not present

## 2017-09-21 DIAGNOSIS — M9901 Segmental and somatic dysfunction of cervical region: Secondary | ICD-10-CM | POA: Diagnosis not present

## 2017-09-27 DIAGNOSIS — Z853 Personal history of malignant neoplasm of breast: Secondary | ICD-10-CM | POA: Diagnosis not present

## 2017-09-27 DIAGNOSIS — R002 Palpitations: Secondary | ICD-10-CM | POA: Diagnosis not present

## 2017-09-27 DIAGNOSIS — I10 Essential (primary) hypertension: Secondary | ICD-10-CM | POA: Diagnosis not present

## 2017-09-27 DIAGNOSIS — J449 Chronic obstructive pulmonary disease, unspecified: Secondary | ICD-10-CM | POA: Diagnosis not present

## 2017-09-27 DIAGNOSIS — E782 Mixed hyperlipidemia: Secondary | ICD-10-CM | POA: Diagnosis not present

## 2017-09-27 DIAGNOSIS — R0602 Shortness of breath: Secondary | ICD-10-CM | POA: Diagnosis not present

## 2017-09-27 DIAGNOSIS — I5032 Chronic diastolic (congestive) heart failure: Secondary | ICD-10-CM | POA: Diagnosis not present

## 2017-09-27 DIAGNOSIS — K219 Gastro-esophageal reflux disease without esophagitis: Secondary | ICD-10-CM | POA: Diagnosis not present

## 2017-09-27 DIAGNOSIS — I5042 Chronic combined systolic (congestive) and diastolic (congestive) heart failure: Secondary | ICD-10-CM | POA: Diagnosis not present

## 2017-09-27 DIAGNOSIS — I2510 Atherosclerotic heart disease of native coronary artery without angina pectoris: Secondary | ICD-10-CM | POA: Diagnosis not present

## 2017-09-27 DIAGNOSIS — I209 Angina pectoris, unspecified: Secondary | ICD-10-CM | POA: Diagnosis not present

## 2017-09-27 DIAGNOSIS — R000 Tachycardia, unspecified: Secondary | ICD-10-CM | POA: Diagnosis not present

## 2017-11-24 DIAGNOSIS — E113393 Type 2 diabetes mellitus with moderate nonproliferative diabetic retinopathy without macular edema, bilateral: Secondary | ICD-10-CM | POA: Diagnosis not present

## 2017-12-01 DIAGNOSIS — M9902 Segmental and somatic dysfunction of thoracic region: Secondary | ICD-10-CM | POA: Diagnosis not present

## 2017-12-01 DIAGNOSIS — M5414 Radiculopathy, thoracic region: Secondary | ICD-10-CM | POA: Diagnosis not present

## 2017-12-01 DIAGNOSIS — R51 Headache: Secondary | ICD-10-CM | POA: Diagnosis not present

## 2017-12-01 DIAGNOSIS — M9901 Segmental and somatic dysfunction of cervical region: Secondary | ICD-10-CM | POA: Diagnosis not present

## 2017-12-20 ENCOUNTER — Ambulatory Visit: Payer: Medicare Other | Admitting: Internal Medicine

## 2017-12-22 DIAGNOSIS — D225 Melanocytic nevi of trunk: Secondary | ICD-10-CM | POA: Diagnosis not present

## 2017-12-22 DIAGNOSIS — C44222 Squamous cell carcinoma of skin of right ear and external auricular canal: Secondary | ICD-10-CM | POA: Diagnosis not present

## 2017-12-22 DIAGNOSIS — L821 Other seborrheic keratosis: Secondary | ICD-10-CM | POA: Diagnosis not present

## 2017-12-22 DIAGNOSIS — D2272 Melanocytic nevi of left lower limb, including hip: Secondary | ICD-10-CM | POA: Diagnosis not present

## 2017-12-22 DIAGNOSIS — D2262 Melanocytic nevi of left upper limb, including shoulder: Secondary | ICD-10-CM | POA: Diagnosis not present

## 2017-12-22 DIAGNOSIS — D485 Neoplasm of uncertain behavior of skin: Secondary | ICD-10-CM | POA: Diagnosis not present

## 2017-12-23 DIAGNOSIS — R0602 Shortness of breath: Secondary | ICD-10-CM | POA: Diagnosis not present

## 2017-12-23 DIAGNOSIS — J449 Chronic obstructive pulmonary disease, unspecified: Secondary | ICD-10-CM | POA: Diagnosis not present

## 2017-12-28 DIAGNOSIS — M9901 Segmental and somatic dysfunction of cervical region: Secondary | ICD-10-CM | POA: Diagnosis not present

## 2017-12-28 DIAGNOSIS — M9902 Segmental and somatic dysfunction of thoracic region: Secondary | ICD-10-CM | POA: Diagnosis not present

## 2017-12-28 DIAGNOSIS — M5414 Radiculopathy, thoracic region: Secondary | ICD-10-CM | POA: Diagnosis not present

## 2017-12-28 DIAGNOSIS — R51 Headache: Secondary | ICD-10-CM | POA: Diagnosis not present

## 2017-12-29 ENCOUNTER — Ambulatory Visit
Admission: EM | Admit: 2017-12-29 | Discharge: 2017-12-29 | Disposition: A | Discharge status: Home / Self Care | Payer: Medicare Other | Attending: Family Medicine | Admitting: Family Medicine

## 2017-12-29 ENCOUNTER — Other Ambulatory Visit: Payer: Self-pay

## 2017-12-29 ENCOUNTER — Encounter: Payer: Self-pay | Admitting: Emergency Medicine

## 2017-12-29 DIAGNOSIS — J45909 Unspecified asthma, uncomplicated: Secondary | ICD-10-CM | POA: Insufficient documentation

## 2017-12-29 DIAGNOSIS — Z7902 Long term (current) use of antithrombotics/antiplatelets: Secondary | ICD-10-CM | POA: Insufficient documentation

## 2017-12-29 DIAGNOSIS — Z885 Allergy status to narcotic agent status: Secondary | ICD-10-CM | POA: Diagnosis not present

## 2017-12-29 DIAGNOSIS — Z880 Allergy status to penicillin: Secondary | ICD-10-CM | POA: Diagnosis not present

## 2017-12-29 DIAGNOSIS — I4519 Other right bundle-branch block: Secondary | ICD-10-CM | POA: Insufficient documentation

## 2017-12-29 DIAGNOSIS — I10 Essential (primary) hypertension: Secondary | ICD-10-CM | POA: Insufficient documentation

## 2017-12-29 DIAGNOSIS — R0789 Other chest pain: Secondary | ICD-10-CM | POA: Diagnosis not present

## 2017-12-29 DIAGNOSIS — G8929 Other chronic pain: Secondary | ICD-10-CM | POA: Insufficient documentation

## 2017-12-29 DIAGNOSIS — E119 Type 2 diabetes mellitus without complications: Secondary | ICD-10-CM | POA: Insufficient documentation

## 2017-12-29 DIAGNOSIS — M542 Cervicalgia: Secondary | ICD-10-CM | POA: Insufficient documentation

## 2017-12-29 DIAGNOSIS — M546 Pain in thoracic spine: Secondary | ICD-10-CM | POA: Diagnosis not present

## 2017-12-29 DIAGNOSIS — M8580 Other specified disorders of bone density and structure, unspecified site: Secondary | ICD-10-CM | POA: Insufficient documentation

## 2017-12-29 DIAGNOSIS — E785 Hyperlipidemia, unspecified: Secondary | ICD-10-CM | POA: Insufficient documentation

## 2017-12-29 DIAGNOSIS — Z7982 Long term (current) use of aspirin: Secondary | ICD-10-CM | POA: Diagnosis not present

## 2017-12-29 DIAGNOSIS — R079 Chest pain, unspecified: Secondary | ICD-10-CM | POA: Diagnosis not present

## 2017-12-29 DIAGNOSIS — Z79899 Other long term (current) drug therapy: Secondary | ICD-10-CM | POA: Diagnosis not present

## 2017-12-29 DIAGNOSIS — K219 Gastro-esophageal reflux disease without esophagitis: Secondary | ICD-10-CM | POA: Diagnosis not present

## 2017-12-29 DIAGNOSIS — Z9011 Acquired absence of right breast and nipple: Secondary | ICD-10-CM | POA: Diagnosis not present

## 2017-12-29 DIAGNOSIS — Z853 Personal history of malignant neoplasm of breast: Secondary | ICD-10-CM | POA: Diagnosis not present

## 2017-12-29 DIAGNOSIS — I25119 Atherosclerotic heart disease of native coronary artery with unspecified angina pectoris: Secondary | ICD-10-CM | POA: Diagnosis not present

## 2017-12-29 LAB — TROPONIN I: Troponin I: <0.03 ng/mL (ref <0.03)

## 2017-12-29 MED ORDER — TRAMADOL HCL 50 MG PO TABS: 50.0000 mg | ORAL_TABLET | Freq: Three times a day (TID) | ORAL | 0 refills | Status: DC | PRN

## 2017-12-29 MED ORDER — BACLOFEN 10 MG PO TABS: 10.0000 mg | ORAL_TABLET | Freq: Three times a day (TID) | ORAL | 0 refills | Status: DC

## 2017-12-29 NOTE — ED Provider Notes (Signed)
[facility]    CSN: [id number] Arrival date & time: 12/29/17  1012  History   Chief Complaint Chief Complaint  Patient presents with  . Back Pain   HPI   72 year old female with a complicated past medical history presents with neck and upper back pain.  Patient reports a 4-day history of neck pain and upper back pain.  No known inciting factor.  Worse with activity, particularly left head rotation and extension.  No recent fall, trauma, injury.  She is taken Tylenol and some of her husband's hydrocodone without significant improvement.  She reports radiation down the left arm.  Patient has an extensive cardiac history.  She states that she has had some angina recently but nothing out of the norm.  She currently denies chest pain.  She has had ongoing shortness of breath but states that this is at her baseline as well.  She is followed by pulmonology.  Additionally, patient states that she has been getting chiropractic treatment as well as massage therapy without lasting improvement.  No other associated symptoms.  No other complaints at this time.  Past Medical History:  Diagnosis Date  . Allergy   . Arthritis   . Asthma   . Breast Cancer 1991   R mastectomy. XRT , chemo  . Breast cancer ([place]) 1991   RT MASTECTOMY  . CAD (coronary artery disease) 2008   50% LAD occlusion 2009,  stent in mid LAD 2008 for 95%   . Chicken pox   . Colon polyps April 2009   by Colonoscopy, Gustavo Lah  . GERD (gastroesophageal reflux disease)   . Heart disease   . Hyperlipidemia   . Hypertension   . 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 Rheumatic fever   . Urinary tract infection     Patient Active Problem List   Diagnosis Date Noted  . Candida rash of groin 06/19/2017  . Irritant contact dermatitis due to plant 06/19/2017  . Insomnia due to stress 09/12/2016  . Allergic reaction to penicillin 09/18/2015  . Diabetes mellitus without complication ([place])  67/67/2094  . Lung disease, restrictive 02/11/2014  . Medicare annual wellness visit, subsequent 12/05/2012  . Essential hypertension 04/14/2012  . Vertebral compression fracture, initial encounter 04/14/2012  . Chronic neck pain 04/14/2012  . Thoracic vertebral fracture ([place]) 04/05/2012  . Angina of effort ([place]) 11/22/2011  . Screening for cervical cancer 11/22/2011  . Hyperlipidemia with target low density lipoprotein (LDL) cholesterol less than 70 mg/dL 11/22/2011  . Personal history of breast cancer   . CAD (coronary artery disease)   . Colon polyps   . Osteopenia due to cancer therapy     Past Surgical History:  Procedure Laterality Date  . BREAST BIOPSY     right breast reconstruction S/P mastectomy  . BREAST SURGERY     right mastectomy  . CORONARY ANGIOPLASTY WITH STENT PLACEMENT  Jan 2013   mid RCA 75% occlusion resolved to 0%, [place]  . MASTECTOMY Right 1991   BREAST CA  . TONSILLECTOMY      OB History    Gravida Para Term Preterm AB Living   4 4           SAB TAB Ectopic Multiple Live Births                   Home Medications    Prior to Admission medications   Medication Sig Start Date End Date Taking?  Authorizing Provider  ALPRAZolam Duanne Moron) 0.5 MG tablet TAKE 1 TABLET AT BEDTIME AS NEEDED FOR ANXIETY. 06/17/17  Yes Crecencio Mc, MD  aspirin 81 MG tablet Take 81 mg by mouth daily.    Yes [provider]  clopidogrel (PLAVIX) 75 MG tablet TAKE ONE TABLET BY MOUTH DAILY 12/25/13  Yes Crecencio Mc, MD  digoxin (LANOXIN) 0.125 MG tablet Take 0.125 mg by mouth daily.   Yes [provider]  furosemide (LASIX) 20 MG tablet Take 1 tablet (20 mg total) by mouth daily. Patient taking differently: Take 40 mg by mouth daily.  02/08/14  Yes Crecencio Mc, MD  isosorbide mononitrate (IMDUR) 30 MG 24 hr tablet Take 2 tablets (60 mg total) by mouth daily. Patient taking differently: Take 30 mg by mouth daily.  11/07/12  Yes Crecencio Mc, MD    losartan (COZAAR) 100 MG tablet TAKE 1 TABLET (100 MG TOTAL) BY MOUTH DAILY. 03/16/14  Yes Crecencio Mc, MD  metoprolol (TOPROL-XL) 200 MG 24 hr tablet TAKE 1 TABLET BY MOUTH DAILY. 08/17/14  Yes Crecencio Mc, MD  niacin 500 MG tablet Take 1 tablet (500 mg total) by mouth daily. 03/16/14  Yes Crecencio Mc, MD  nitroGLYCERIN (NITROSTAT) 0.4 MG SL tablet Place under the tongue. 04/01/17 04/01/18 Yes [provider]  potassium chloride SA (K-DUR,KLOR-CON) 20 MEQ tablet Take 1 tablet (20 mEq total) by mouth daily. 02/08/14  Yes Crecencio Mc, MD  baclofen (LIORESAL) 10 MG tablet Take 1 tablet (10 mg total) by mouth 3 (three) times daily. 12/29/17   Coral Spikes, DO  Lancets (FREESTYLE) lancets uSE TO CHECK SUGARS ONCE DAILY e11.9 09/10/16   Crecencio Mc, MD  nitrofurantoin (MACRODANTIN) 100 MG capsule Take 1 capsule (100 mg total) by mouth daily. 10/04/15   Crecencio Mc, MD  omeprazole (PRILOSEC) 20 MG capsule Take 1 capsule (20 mg total) by mouth daily. 08/17/14   Crecencio Mc, MD  ONE TOUCH ULTRA TEST test strip CHECK DAILY 03/12/14   Crecencio Mc, MD  [facility] DELICA LANCETS 56L MISC CHECK DAILY 03/14/14   Crecencio Mc, MD  traMADol (ULTRAM) 50 MG tablet Take 1 tablet (50 mg total) by mouth every 8 (eight) hours as needed. 12/29/17   Coral Spikes, DO    Family History Family History  Problem Relation Age of Onset  . Heart disease Mother 29       massive MI  . Heart disease Father 55       AMI  . Cancer Father        metastatic lung CA  . Diabetes Sister   . Cancer Maternal Aunt   . Breast cancer Maternal Aunt 104  . Breast cancer Cousin        68s  . Breast cancer Cousin        72s    Social History Social History   Tobacco Use  . Smoking status: Never Smoker  . Smokeless tobacco: Never Used  Substance Use Topics  . Alcohol use: Yes    Comment: occasional wine  . Drug use: No     Allergies   Penicillins and Hydrocodone-acetaminophen   Review of  Systems Review of Systems  Respiratory: Positive for shortness of breath.   Cardiovascular: Positive for chest pain.  Musculoskeletal: Positive for back pain and neck pain.   Physical Exam Triage Vital Signs ED Triage Vitals  Enc Vitals Group     BP  12/29/17 1042 (!) 152/71     Pulse Rate 12/29/17 1042 86     Resp 12/29/17 1042 16     Temp 12/29/17 1042 98.1 F (36.7 C)     Temp Source 12/29/17 1042 Oral     SpO2 12/29/17 1042 97 %     Weight 12/29/17 1041 176 lb (79.8 kg)     Height 12/29/17 1041 5\' 4"  (1.626 m)     Head Circumference --      Peak Flow --      Pain Score 12/29/17 1042 10     Pain Loc --      Pain Edu? --      Excl. in [place]? --    Updated Vital Signs BP (!) 152/71 (BP Location: Left Arm)   Pulse 86   Temp 98.1 F (36.7 C) (Oral)   Resp 16   Ht 5\' 4"  (1.626 m)   Wt 176 lb (79.8 kg)   SpO2 97%   BMI 30.21 kg/m   Physical Exam  Constitutional: She is oriented to person, place, and time. She appears well-developed and well-nourished. No distress.  HENT:  Head: Normocephalic and atraumatic.  Neck:  Decreased range of motion in left rotation and extension secondary to pain.  Cardiovascular: Normal rate and regular rhythm.  Pulmonary/Chest: Effort normal and breath sounds normal. She has no wheezes. She has no rales.  Musculoskeletal:  Patient with tension/spasm noted around the left scapula.  Neurological: She is alert and oriented to person, place, and time.  Psychiatric: She has a normal mood and affect. Her behavior is normal.  Nursing note and vitals reviewed.  UC Treatments / Results  Labs (all labs ordered are listed, but only abnormal results are displayed) Labs Reviewed  TROPONIN I    EKG Interpretation: Normal sinus rhythm with rate of 87.  And complete right bundle branch block.  Q waves noted in V1 and V2.  Nonspecific T wave changes.  Radiology No results found.  Procedures Procedures (including critical care time)  Medications  Ordered in UC Medications - No data to display   Initial Impression / Assessment and Plan / UC Course  I have reviewed the triage vital signs and the nursing notes.  Pertinent labs & imaging results that were available during my care of the patient were reviewed by me and considered in my medical decision making (see chart for details).     72 year old female presents with back and neck pain.  She has had some recent angina and so troponin was done to ensure no recent cardiac event.  Troponin negative.  Patient well-appearing.  Appears musculoskeletal in origin.  Treating with baclofen and tramadol.  No NSAIDs given cardiac history.  Final Clinical Impressions(s) / UC Diagnoses   Final diagnoses:  Acute left-sided thoracic back pain  Neck pain    ED Discharge Orders        Ordered    baclofen (LIORESAL) 10 MG tablet  3 times daily     12/29/17 1200    traMADol (ULTRAM) 50 MG tablet  Every 8 hours PRN     12/29/17 1200     Controlled Substance Prescriptions [place] Controlled Substance Registry consulted? No   Coral Spikes, [place] 12/29/17 1205

## 2017-12-29 NOTE — ED Triage Notes (Addendum)
Patient in today c/o mid back pain x 4-5 days. Patient states she has had this before and thinks she has a pinched nerve. Patient states she doesn't think it is cardiac, but does have 4 stents and states the pain goes down her left arm sometimes.

## 2017-12-29 NOTE — Discharge Instructions (Signed)
Rest.  Use the medications as needed.  Take care  Dr. Lacinda Axon

## 2017-12-30 DIAGNOSIS — M9901 Segmental and somatic dysfunction of cervical region: Secondary | ICD-10-CM | POA: Diagnosis not present

## 2017-12-30 DIAGNOSIS — M5414 Radiculopathy, thoracic region: Secondary | ICD-10-CM | POA: Diagnosis not present

## 2017-12-30 DIAGNOSIS — M9902 Segmental and somatic dysfunction of thoracic region: Secondary | ICD-10-CM | POA: Diagnosis not present

## 2017-12-30 DIAGNOSIS — R51 Headache: Secondary | ICD-10-CM | POA: Diagnosis not present

## 2017-12-31 DIAGNOSIS — M9901 Segmental and somatic dysfunction of cervical region: Secondary | ICD-10-CM | POA: Diagnosis not present

## 2017-12-31 DIAGNOSIS — M5414 Radiculopathy, thoracic region: Secondary | ICD-10-CM | POA: Diagnosis not present

## 2017-12-31 DIAGNOSIS — R51 Headache: Secondary | ICD-10-CM | POA: Diagnosis not present

## 2017-12-31 DIAGNOSIS — M9902 Segmental and somatic dysfunction of thoracic region: Secondary | ICD-10-CM | POA: Diagnosis not present

## 2018-01-05 ENCOUNTER — Other Ambulatory Visit: Payer: Self-pay | Admitting: Internal Medicine

## 2018-01-05 NOTE — Telephone Encounter (Signed)
Refilled: 06/17/2017 Last OV: 06/17/2017 Next OV: 02/01/2018

## 2018-01-06 ENCOUNTER — Encounter: Payer: Self-pay | Admitting: Internal Medicine

## 2018-01-06 ENCOUNTER — Ambulatory Visit (INDEPENDENT_AMBULATORY_CARE_PROVIDER_SITE_OTHER): Payer: Medicare Other | Admitting: Internal Medicine

## 2018-01-06 ENCOUNTER — Ambulatory Visit (INDEPENDENT_AMBULATORY_CARE_PROVIDER_SITE_OTHER): Payer: Medicare Other

## 2018-01-06 ENCOUNTER — Ambulatory Visit: Payer: Medicare Other

## 2018-01-06 VITALS — BP 136/90 | HR 98 | Temp 98.9°F | Ht 64.0 in

## 2018-01-06 DIAGNOSIS — M25512 Pain in left shoulder: Secondary | ICD-10-CM

## 2018-01-06 DIAGNOSIS — C44722 Squamous cell carcinoma of skin of right lower limb, including hip: Secondary | ICD-10-CM | POA: Diagnosis not present

## 2018-01-06 DIAGNOSIS — M47814 Spondylosis without myelopathy or radiculopathy, thoracic region: Secondary | ICD-10-CM | POA: Diagnosis not present

## 2018-01-06 DIAGNOSIS — M542 Cervicalgia: Secondary | ICD-10-CM | POA: Diagnosis not present

## 2018-01-06 DIAGNOSIS — M546 Pain in thoracic spine: Secondary | ICD-10-CM | POA: Diagnosis not present

## 2018-01-06 DIAGNOSIS — T300 Burn of unspecified body region, unspecified degree: Secondary | ICD-10-CM

## 2018-01-06 DIAGNOSIS — R9389 Abnormal findings on diagnostic imaging of other specified body structures: Secondary | ICD-10-CM

## 2018-01-06 DIAGNOSIS — J986 Disorders of diaphragm: Secondary | ICD-10-CM | POA: Diagnosis not present

## 2018-01-06 DIAGNOSIS — M47812 Spondylosis without myelopathy or radiculopathy, cervical region: Secondary | ICD-10-CM | POA: Diagnosis not present

## 2018-01-06 MED ORDER — MUPIROCIN 2 % EX OINT: 1.0000 "application " | TOPICAL_OINTMENT | Freq: Two times a day (BID) | CUTANEOUS | 2 refills | Status: DC

## 2018-01-06 MED ORDER — CYCLOBENZAPRINE HCL 5 MG PO TABS: 5.0000 mg | ORAL_TABLET | Freq: Every evening | ORAL | 1 refills | Status: DC | PRN

## 2018-01-06 MED ORDER — DOXYCYCLINE HYCLATE 100 MG PO TABS: 100.0000 mg | ORAL_TABLET | Freq: Two times a day (BID) | ORAL | 0 refills | Status: DC

## 2018-01-06 MED ORDER — OXYCODONE-ACETAMINOPHEN 5-325 MG PO TABS: 1.0000 | ORAL_TABLET | Freq: Two times a day (BID) | ORAL | 0 refills | Status: DC | PRN

## 2018-01-06 MED ORDER — PREDNISONE 10 MG PO TABS: 10.0000 mg | ORAL_TABLET | Freq: Every day | ORAL | 0 refills | Status: DC

## 2018-01-06 NOTE — Telephone Encounter (Signed)
Printed, signed and faxed.  

## 2018-01-06 NOTE — Patient Instructions (Addendum)
F/u in 2 weeks with PCP  Back Pain, Adult Back pain is very common. The pain often gets better over time. The cause of back pain is usually not dangerous. Most people can learn to manage their back pain on their own. Follow these instructions at home: Watch your back pain for any changes. The following actions may help to lessen any pain you are feeling:  Stay active. Start with short walks on flat ground if you can. Try to walk farther each day.  Exercise regularly as told by your doctor. Exercise helps your back heal faster. It also helps avoid future injury by keeping your muscles strong and flexible.  Do not sit, drive, or stand in one place for more than 30 minutes.  Do not stay in bed. Resting more than 1-2 days can slow down your recovery.  Be careful when you bend or lift an object. Use good form when lifting: ? Bend at your knees. ? Keep the object close to your body. ? Do not twist.  Sleep on a firm mattress. Lie on your side, and bend your knees. If you lie on your back, put a pillow under your knees.  Take medicines only as told by your doctor.  Put ice on the injured area. ? Put ice in a plastic bag. ? Place a towel between your skin and the bag. ? Leave the ice on for 20 minutes, 2-3 times a day for the first 2-3 days. After that, you can switch between ice and heat packs.  Avoid feeling anxious or stressed. Find good ways to deal with stress, such as exercise.  Maintain a healthy weight. Extra weight puts stress on your back.  Contact a doctor if:  You have pain that does not go away with rest or medicine.  You have worsening pain that goes down into your legs or buttocks.  You have pain that does not get better in one week.  You have pain at night.  You lose weight.  You have a fever or chills. Get help right away if:  You cannot control when you poop (bowel movement) or pee (urinate).  Your arms or legs feel weak.  Your arms or legs lose feeling  (numbness).  You feel sick to your stomach (nauseous) or throw up (vomit).  You have belly (abdominal) pain.  You feel like you may pass out (faint). This information is not intended to replace advice given to you by your health care provider. Make sure you discuss any questions you have with your health care provider. Document Released: 04/20/2008 Document Revised: 04/09/2016 Document Reviewed: 03/06/2014 Elsevier Interactive Patient Education  2018 [employer].  Cervical Radiculopathy Cervical radiculopathy means that a nerve in the neck is pinched or bruised. This can cause pain or loss of feeling (numbness) that runs from your neck to your arm and fingers. Follow these instructions at home: Managing pain  Take over-the-counter and prescription medicines only as told by your doctor.  If directed, put ice on the injured or painful area. ? Put ice in a plastic bag. ? Place a towel between your skin and the bag. ? Leave the ice on for 20 minutes, 2-3 times per day.  If ice does not help, you can try using heat. Take a warm shower or warm bath, or use a heat pack as told by your doctor.  You may try a gentle neck and shoulder massage. Activity  Rest as needed. Follow instructions from your doctor about any  activities to avoid.  Do exercises as told by your doctor or physical therapist. General instructions  If you were given a soft collar, wear it as told by your doctor.  Use a flat pillow when you sleep.  Keep all follow-up visits as told by your doctor. This is important. Contact a doctor if:  Your condition does not improve with treatment. Get help right away if:  Your pain gets worse and is not controlled with medicine.  You lose feeling or feel weak in your hand, arm, face, or leg.  You have a fever.  You have a stiff neck.  You cannot control when you poop or pee (have incontinence).  You have trouble with walking, balance, or talking. This information is  not intended to replace advice given to you by your health care provider. Make sure you discuss any questions you have with your health care provider. Document Released: 10/22/2011 Document Revised: 04/09/2016 Document Reviewed: 12/27/2014 Elsevier Interactive Patient Education  2018 [place], Adult A burn is an injury to the skin or the tissues under the skin. There are three types of burns:  First degree. These burns may cause the skin to be red and slightly swollen.  Second degree. These burns are very painful and cause the skin to be very red. The skin may also leak fluid, look shiny, and develop blisters.  Third degree. These burns cause permanent damage. They either turn the skin white or black and make it look charred, dry, and leathery.  Taking care of your burn properly can help to prevent pain and infection. It can also help the burn to heal more quickly. What are the risks? Complications from burns include:  Damage to the skin.  Reduced blood flow near the injury.  Dead tissue.  Scarring.  Problems with movement, if the burn happened near a joint or on the hands or feet.  Severe burns can lead to problems that affect the whole body, such as:  Fluid loss.  Less blood circulating in the body.  Inability to maintain a normal core body temperature (thermoregulation).  Infection.  Shock.  Problems breathing.  How to care for a first-degree burn Right after a burn:  Rinse or soak the burn under cool water until the pain stops. Do not put ice on your burn. This can cause more damage.  Lightly cover the burn with a sterile cloth (dressing). Burn care  Follow instructions from your health care provider about: ? How to clean and take care of the burn. ? When to change and remove the dressing.  Check your burn every day for signs of infection. Check for: ? More redness, swelling, or pain. ? Warmth. ? Pus or a bad smell. Medicine  Take  over-the-counter and prescription medicines only as told by your health care provider.  If you were prescribed antibiotic medicine, take or apply it as told by your health care provider. Do not stop using the antibiotic even if your condition improves. General instructions  To prevent infection, do not put butter, oil, or other home remedies on your burn.  Do not rub your burn, even when you are cleaning it.  Protect your burn from the sun. How to care for a second-degree burn Right after a burn:  Rinse or soak the burn under cool water. Do this for several minutes. Do not put ice on your burn. This can cause more damage.  Lightly cover the burn with a sterile cloth (dressing).  Burn care  Raise (elevate) the injured area above the level of your heart while sitting or lying down.  Follow instructions from your health care provider about: ? How to clean and take care of the burn. ? When to change and remove the dressing.  Check your burn every day for signs of infection. Check for: ? More redness, swelling, or pain. ? Warmth. ? Pus or a bad smell. Medicine   Take over-the-counter and prescription medicines only as told by your health care provider.  If you were prescribed antibiotic medicine, take or apply it as told by your health care provider. Do not stop using the antibiotic even if your condition improves. General instructions  To prevent infection: ? Do not put butter, oil, or other home remedies on the burn. ? Do not scratch or pick at the burn. ? Do not break any blisters. ? Do not peel skin.  Do not rub your burn, even when you are cleaning it.  Protect your burn from the sun. How to care for a third-degree burn Right after a burn:  Lightly cover the burn with gauze.  Seek immediate medical attention. Burn care  Raise (elevate) the injured area above the level of your heart while sitting or lying down.  Drink enough fluid to keep your urine clear or pale  yellow.  Rest as told by your health care provider. Do not participate in sports or other physical activities until your health care provider approves.  Follow instructions from your health care provider about: ? How to clean and take care of the burn. ? When to change and remove the dressing.  Check your burn every day for signs of infection. Check for: ? More redness, swelling, or pain. ? Warmth. ? Pus or a bad smell. Medicine  Take over-the-counter and prescription medicines only as told by your health care provider.  If you were prescribed antibiotic medicine, take or apply it as told by your health care provider. Do not stop using the antibiotic even if your condition improves. General instructions  To prevent infection: ? Do not put butter, oil, or other home remedies on the burn. ? Do not scratch or pick at the burn. ? Do not break any blisters. ? Do not peel skin. ? Do not rub your burn, even when you are cleaning it.  Protect your burn from the sun.  Keep all follow-up visits as told by your health care provider. This is important. Contact a health care provider if:  Your condition does not improve.  Your condition gets worse.  You have a fever.  Your burn changes in appearance or develops black or red spots.  Your burn feels warm to the touch.  Your pain is not controlled with medicine. Get help right away if:  You have redness, swelling, or pain at the site of the burn.  You have fluid, blood, or pus coming from your burn.  You have red streaks near the burn.  You have severe pain. This information is not intended to replace advice given to you by your health care provider. Make sure you discuss any questions you have with your health care provider. Document Released: 11/02/2005 Document Revised: 05/24/2016 Document Reviewed: 04/21/2016 Elsevier Interactive Patient Education  2018 [employer].

## 2018-01-06 NOTE — Progress Notes (Signed)
Pre visit review using our clinic review tool, if applicable. No additional management support is needed unless otherwise documented below in the visit note. 

## 2018-01-09 ENCOUNTER — Encounter: Payer: Self-pay | Admitting: Internal Medicine

## 2018-01-09 DIAGNOSIS — R9389 Abnormal findings on diagnostic imaging of other specified body structures: Secondary | ICD-10-CM

## 2018-01-09 DIAGNOSIS — R918 Other nonspecific abnormal finding of lung field: Secondary | ICD-10-CM | POA: Insufficient documentation

## 2018-01-09 DIAGNOSIS — J986 Disorders of diaphragm: Secondary | ICD-10-CM | POA: Insufficient documentation

## 2018-01-09 DIAGNOSIS — M25512 Pain in left shoulder: Secondary | ICD-10-CM | POA: Insufficient documentation

## 2018-01-09 DIAGNOSIS — T300 Burn of unspecified body region, unspecified degree: Secondary | ICD-10-CM | POA: Insufficient documentation

## 2018-01-09 DIAGNOSIS — C44722 Squamous cell carcinoma of skin of right lower limb, including hip: Secondary | ICD-10-CM

## 2018-01-09 DIAGNOSIS — M546 Pain in thoracic spine: Secondary | ICD-10-CM | POA: Insufficient documentation

## 2018-01-09 HISTORY — DX: Squamous cell carcinoma of skin of right lower limb, including hip: C44.722

## 2018-01-09 NOTE — Progress Notes (Signed)
Chief Complaint  Patient presents with  . Follow-up   F/u with multiple complaints  1. H/o right breast cancer s/p mastectomy with reconstruction with flap. She c/o large burn to right breast x few days she was sleeping with the heating blanket last Friday prior to visit today and has large burn nothing tried. Reviewed mammo last 06/2017 left negative 2. C/o neck, mid back and left shoulder pain x 2 weeks. Shoulder pain radiates to left arm.  She went to urgent care and was given Baclofen which she stated helped some she was also given a pain medication tramadol. Lateral rotation neck is limited esp turning head to the left and she has pain. She has tried heating blanket. She also reports h/o right knee pain 12/2017 and it was red and swollen x 1 week now better.  She also mentions previously had right arm and hand pain and saw a chiropractor x 1 years.  Pain is 12/10 per pt esp left shoulder, neck and mid back.   3. She also reports h/o SCC recently bx'ed and tx'ed with dermatology to RLE 4. She also reports f/u with lung MD Dr. 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 And she had sniff test 11/26/16 but no explanation has been explained ot her why this is the case. Reviewed CT chest previously with +ground glass appearance noted 02/08/14 and she has not had f/u CT chest will order     Review of Systems  Constitutional:       Pt declined to have wt checked today   HENT: Negative for hearing loss.   Eyes: Negative for blurred vision.  Respiratory: Negative for shortness of breath.   Cardiovascular: Negative for chest pain.  Gastrointestinal: Negative for abdominal pain.  Musculoskeletal: Positive for joint pain and neck pain.  Skin:       +burn to right breast   Neurological: Negative for headaches.  Psychiatric/Behavioral: Negative for memory loss.   Past Medical History:  Diagnosis Date  . Allergy   . Arthritis   . Asthma   . Breast Cancer 1991     R mastectomy. XRT , chemo  . Breast cancer ([place]) 1991   RT MASTECTOMY  . CAD (coronary artery disease) 2008   50% LAD occlusion 2009,  stent in mid LAD 2008 for 95%   . Chicken pox   . Colon polyps April 2009   by Colonoscopy, Gustavo Lah  . GERD (gastroesophageal reflux disease)   . Heart disease   . Hyperlipidemia   . Hypertension   . 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 Rheumatic fever   . Urinary tract infection    Past Surgical History:  Procedure Laterality Date  . BREAST BIOPSY     right breast reconstruction S/P mastectomy  . BREAST SURGERY     right mastectomy  . CORONARY ANGIOPLASTY WITH STENT PLACEMENT  Jan 2013   mid RCA 75% occlusion resolved to 0%, [place]  . MASTECTOMY Right 1991   BREAST CA  . TONSILLECTOMY     Family History  Problem Relation Age of Onset  . Heart disease Mother 54       massive MI  . Heart disease Father 22       AMI  . Cancer Father        metastatic lung CA  . Diabetes Sister   . Cancer Maternal Aunt   . Breast cancer Maternal  Aunt 50  . Breast cancer Cousin        75s  . Breast cancer Cousin        23s   Social History   Socioeconomic History  . Marital status: Married    Spouse name: Blondell Reveal   . Number of children: 4  . Years of education: bachelors  . Highest education level: Not on file  Social Needs  . Financial resource strain: Not on file  . Food insecurity - worry: Not on file  . Food insecurity - inability: Not on file  . Transportation needs - medical: Not on file  . Transportation needs - non-medical: Not on file  Occupational History  . Occupation: RETIRED  Tobacco Use  . Smoking status: Never Smoker  . Smokeless tobacco: Never Used  Substance and Sexual Activity  . Alcohol use: Yes    Comment: occasional wine  . Drug use: No  . Sexual activity: Yes  Other Topics Concern  . Not on file  Social History Narrative  . Not on file   Current Meds  Medication Sig  .  ALPRAZolam (XANAX) 0.5 MG tablet TAKE 1 TABLET AT BEDTIME AS NEEDED FOR ANXIETY.  Marland Kitchen aspirin 81 MG tablet Take 81 mg by mouth daily.   . clopidogrel (PLAVIX) 75 MG tablet TAKE ONE TABLET BY MOUTH DAILY  . digoxin (LANOXIN) 0.125 MG tablet Take 0.125 mg by mouth daily.  . furosemide (LASIX) 20 MG tablet Take 1 tablet (20 mg total) by mouth daily. (Patient taking differently: Take 40 mg by mouth daily. )  . isosorbide mononitrate (IMDUR) 30 MG 24 hr tablet Take 2 tablets (60 mg total) by mouth daily. (Patient taking differently: Take 30 mg by mouth daily. )  . Lancets (FREESTYLE) lancets uSE TO CHECK SUGARS ONCE DAILY e11.9  . losartan (COZAAR) 100 MG tablet TAKE 1 TABLET (100 MG TOTAL) BY MOUTH DAILY.  . metoprolol (TOPROL-XL) 200 MG 24 hr tablet TAKE 1 TABLET BY MOUTH DAILY.  . niacin 500 MG tablet Take 1 tablet (500 mg total) by mouth daily.  . nitrofurantoin (MACRODANTIN) 100 MG capsule Take 1 capsule (100 mg total) by mouth daily.  . nitroGLYCERIN (NITROSTAT) 0.4 MG SL tablet Place under the tongue.  Marland Kitchen omeprazole (PRILOSEC) 20 MG capsule Take 1 capsule (20 mg total) by mouth daily.  . ONE TOUCH ULTRA TEST test strip CHECK DAILY  . ONETOUCH DELICA LANCETS 84Z MISC CHECK DAILY  . potassium chloride SA (K-DUR,KLOR-CON) 20 MEQ tablet Take 1 tablet (20 mEq total) by mouth daily.  . traMADol (ULTRAM) 50 MG tablet Take 1 tablet (50 mg total) by mouth every 8 (eight) hours as needed.  . [DISCONTINUED] baclofen (LIORESAL) 10 MG tablet Take 1 tablet (10 mg total) by mouth 3 (three) times daily.   Allergies  Allergen Reactions  . Penicillins Hives  . Hydrocodone-Acetaminophen Nausea Only   Recent Results (from the past 2160 hour(s))  Troponin I     Status: None   Collection Time: 12/29/17 11:20 AM  Result Value Ref Range   Troponin I <0.03 <0.03 ng/mL    Comment: Performed at [facility], [address] [place], [place] [address]   Objective  Body mass index is 30.21 kg/m. Wt  Readings from Last 3 Encounters:  12/29/17 176 lb (79.8 kg)  06/17/17 176 lb (79.8 kg)  06/08/17 176 lb 6.4 oz (80 kg)   Temp Readings from Last 3 Encounters:  01/06/18 98.9 F (37.2 C) (  Oral)  12/29/17 98.1 F (36.7 C) (Oral)  06/17/17 99 F (37.2 C) (Oral)   BP Readings from Last 3 Encounters:  01/06/18 136/90  12/29/17 (!) 152/71  06/17/17 (!) 148/80   Pulse Readings from Last 3 Encounters:  01/06/18 98  12/29/17 86  06/17/17 80   Of note pt declined to have weight checked  O2 sat room air 93%  Physical Exam  Constitutional: She is oriented to person, place, and time and well-developed, well-nourished, and in no distress. Vital signs are normal.  HENT:  Head: Normocephalic and atraumatic.  Mouth/Throat: Oropharynx is clear and moist and mucous membranes are normal.  Eyes: Conjunctivae are normal. Pupils are equal, round, and reactive to light.  Cardiovascular: Normal rate, regular rhythm and normal heart sounds.  Pulmonary/Chest: Effort normal and breath sounds normal.    Musculoskeletal: She exhibits tenderness.       Left shoulder: She exhibits tenderness.       Cervical back: She exhibits decreased range of motion and tenderness.       Thoracic back: She exhibits tenderness.  Mild to moderate ttp cervical thoracic and left shoulder  Limited ROM neck esp to left   Neurological: She is alert and oriented to person, place, and time. Gait normal. Gait normal.  Skin: Skin is warm and dry. Burn noted.  Large burn to right breast 7 x 4.5 cm with surrounding erythema   Psychiatric: Mood, memory and judgment normal. She has a flat affect.  Nursing note and vitals reviewed.   Assessment   1. Suspected burn to right breast from heating blanket 7 x 4.5 cm  2. Cervicalgia chronic, mid back pian and left shoulder pain  3. Recent SCC RLE 4. 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 Raul Del, also CT chest 01/2014 +ground glass appearance never  smoker also h/o b/l pleural effusions  5. HM  Plan  1.  Bactroban, doxycycline F/u in 2 weeks if not better with history consider imaging of right breast at well. I do believe pt could have burn with using heating blanket recently and she seems like reliable historian but she does have h/o right breast cancer s/p mastectomy with reconstruction  Considered referral to wound clinic if not healing at f/u  2.  Consider PT, ortho referral  Xray neck, mid back and left shoulder today  Neck with severe changes consider MRI C spine (also see below) Stop Baclofen prn Flexeril, low dose steroid prednisone 10, percocet prn x 5 days   3.  F/u dermatology  4. Sees Dr. Raul Del lung MD Consider thyroid testing in future as thyroid d/o can cause b/l > unilateral hemidiaphragm weakness. Other w/u to consider could be EMG/NCS with neurology  Part of w/u also is CT chest will order confirm with radiology w/o contrast ok and given h/o pleural effusions and ground glass lung order CT chest to w/u  Also with h/o neck pain MRI C spine per up to date can be obtained as well  Had pfts 12/23/17  5.  Had flu shot, utd prevnar, pna 23, Tdap,  Consider shingrix if has not had  DEXA 07/07/16 next visit make sure taking calcium 1200 mg qd/vit D3 1000 IU qd last vit D 12/15/16 39.02  Colonoscopy 02/05/14 [facility] diverticulosis per report repeat due in 5 years  Pap out of age window 11/23/11 neg neg HPV mammo left breast 06/2017 neg  HCV neg 02/15/15  A1C 06/08/17 6.3   Spent >  30 min with pt reviewing charge and formulating treatment plan  Provider: Dr. Olivia Mackie McLean-Scocuzza-Internal Medicine

## 2018-01-10 ENCOUNTER — Encounter: Payer: Self-pay | Admitting: Internal Medicine

## 2018-01-10 DIAGNOSIS — D0471 Carcinoma in situ of skin of right lower limb, including hip: Secondary | ICD-10-CM | POA: Diagnosis not present

## 2018-01-10 DIAGNOSIS — C44722 Squamous cell carcinoma of skin of right lower limb, including hip: Secondary | ICD-10-CM | POA: Diagnosis not present

## 2018-01-13 ENCOUNTER — Encounter: Payer: Self-pay | Admitting: Internal Medicine

## 2018-01-14 ENCOUNTER — Other Ambulatory Visit: Payer: Self-pay | Admitting: Internal Medicine

## 2018-01-14 ENCOUNTER — Telehealth: Payer: Self-pay

## 2018-01-14 DIAGNOSIS — M25512 Pain in left shoulder: Secondary | ICD-10-CM

## 2018-01-14 DIAGNOSIS — M542 Cervicalgia: Secondary | ICD-10-CM

## 2018-01-14 DIAGNOSIS — M549 Dorsalgia, unspecified: Secondary | ICD-10-CM

## 2018-01-14 NOTE — Telephone Encounter (Signed)
Please advise 

## 2018-01-14 NOTE — Telephone Encounter (Signed)
Copied from [place]. Topic: Referral - Question >> Jan 14, 2018  4:39 PM Ahmed Prima L wrote: Reason for CRM: pt would like to know which office that her referral for physical therapy went too. I couldn't see it.  Call patient at [phone]

## 2018-01-17 ENCOUNTER — Other Ambulatory Visit: Payer: Self-pay | Admitting: Internal Medicine

## 2018-01-17 ENCOUNTER — Encounter: Payer: Self-pay | Admitting: Internal Medicine

## 2018-01-17 ENCOUNTER — Ambulatory Visit
Admission: RE | Admit: 2018-01-17 | Discharge: 2018-01-17 | Disposition: A | Discharge status: Home / Self Care | Payer: Medicare Other | Source: Ambulatory Visit | Attending: Internal Medicine | Admitting: Internal Medicine

## 2018-01-17 DIAGNOSIS — M546 Pain in thoracic spine: Secondary | ICD-10-CM

## 2018-01-17 DIAGNOSIS — J986 Disorders of diaphragm: Secondary | ICD-10-CM | POA: Diagnosis not present

## 2018-01-17 DIAGNOSIS — I700 Atherosclerosis of aorta: Secondary | ICD-10-CM | POA: Insufficient documentation

## 2018-01-17 DIAGNOSIS — M25512 Pain in left shoulder: Secondary | ICD-10-CM

## 2018-01-17 DIAGNOSIS — R918 Other nonspecific abnormal finding of lung field: Secondary | ICD-10-CM | POA: Insufficient documentation

## 2018-01-17 DIAGNOSIS — M542 Cervicalgia: Secondary | ICD-10-CM

## 2018-01-17 DIAGNOSIS — R9389 Abnormal findings on diagnostic imaging of other specified body structures: Secondary | ICD-10-CM

## 2018-01-17 MED ORDER — OXYCODONE-ACETAMINOPHEN 5-325 MG PO TABS: 1.0000 | ORAL_TABLET | Freq: Two times a day (BID) | ORAL | 0 refills | Status: DC | PRN

## 2018-01-17 NOTE — Progress Notes (Signed)
My appointment with Dr. Derrel Nip is not until Thursday, however I am in so much pain and don't think I can make it until Thurs. I have tried to manage it myself by using heat, cold, tylenol, pain relieving cream, and by using a TENS Unit, with very little relief. Can I get something else for pain at least until I see Dr. Derrel Nip.    Thanks.  Stacie Mcdonald   Sent #10 more percocet 5-325 mg bid prn 01/17/18   [place]

## 2018-01-18 ENCOUNTER — Telehealth: Payer: Self-pay | Admitting: Internal Medicine

## 2018-01-18 NOTE — Telephone Encounter (Signed)
[facility] imaging called to advise PCP CT scan results available in Epic.

## 2018-01-20 ENCOUNTER — Ambulatory Visit (INDEPENDENT_AMBULATORY_CARE_PROVIDER_SITE_OTHER): Payer: Medicare Other | Admitting: Internal Medicine

## 2018-01-20 ENCOUNTER — Encounter: Payer: Self-pay | Admitting: Internal Medicine

## 2018-01-20 VITALS — BP 130/78 | HR 104 | Temp 98.8°F | Resp 16

## 2018-01-20 DIAGNOSIS — J986 Disorders of diaphragm: Secondary | ICD-10-CM | POA: Diagnosis not present

## 2018-01-20 DIAGNOSIS — M542 Cervicalgia: Secondary | ICD-10-CM

## 2018-01-20 DIAGNOSIS — E119 Type 2 diabetes mellitus without complications: Secondary | ICD-10-CM

## 2018-01-20 DIAGNOSIS — I25119 Atherosclerotic heart disease of native coronary artery with unspecified angina pectoris: Secondary | ICD-10-CM | POA: Diagnosis not present

## 2018-01-20 DIAGNOSIS — I10 Essential (primary) hypertension: Secondary | ICD-10-CM

## 2018-01-20 DIAGNOSIS — R918 Other nonspecific abnormal finding of lung field: Secondary | ICD-10-CM | POA: Diagnosis not present

## 2018-01-20 DIAGNOSIS — M5030 Other cervical disc degeneration, unspecified cervical region: Secondary | ICD-10-CM | POA: Diagnosis not present

## 2018-01-20 DIAGNOSIS — E1169 Type 2 diabetes mellitus with other specified complication: Secondary | ICD-10-CM | POA: Diagnosis not present

## 2018-01-20 DIAGNOSIS — E785 Hyperlipidemia, unspecified: Secondary | ICD-10-CM

## 2018-01-20 DIAGNOSIS — G8929 Other chronic pain: Secondary | ICD-10-CM

## 2018-01-20 DIAGNOSIS — T300 Burn of unspecified body region, unspecified degree: Secondary | ICD-10-CM

## 2018-01-20 LAB — TSH: TSH: 2.18 u[IU]/mL (ref 0.35–4.50)

## 2018-01-20 LAB — COMPREHENSIVE METABOLIC PANEL
ALK PHOS: 48 U/L (ref 39–117)
ALT: 25 U/L (ref 0–35)
AST: 20 U/L (ref 0–37)
Albumin: 4.5 g/dL (ref 3.5–5.2)
BILIRUBIN TOTAL: 0.4 mg/dL (ref 0.2–1.2)
BUN: 16 mg/dL (ref 6–23)
CALCIUM: 10.1 mg/dL (ref 8.4–10.5)
CHLORIDE: 100 meq/L (ref 96–112)
CO2: 26 mEq/L (ref 19–32)
Creatinine, Ser: 0.56 mg/dL (ref 0.40–1.20)
GFR: 112.77 mL/min (ref >60.00)
GLUCOSE: 99 mg/dL (ref 70–99)
POTASSIUM: 3.7 meq/L (ref 3.5–5.1)
SODIUM: 138 meq/L (ref 135–145)
Total Protein: 7.6 g/dL (ref 6.0–8.3)

## 2018-01-20 LAB — MICROALBUMIN / CREATININE URINE RATIO
CREATININE, U: 51.3 mg/dL
Microalb Creat Ratio: 1.4 mg/g (ref 0.0–30.0)
Microalb, Ur: <0.7 mg/dL (ref 0.0–1.9)

## 2018-01-20 LAB — LDL CHOLESTEROL, DIRECT: LDL DIRECT: 123 mg/dL

## 2018-01-20 LAB — HEMOGLOBIN A1C: Hgb A1c MFr Bld: 6.5 % (ref 4.6–6.5)

## 2018-01-20 MED ORDER — DIAZEPAM 10 MG PO TABS: 10.0000 mg | ORAL_TABLET | Freq: Two times a day (BID) | ORAL | 0 refills | Status: DC | PRN

## 2018-01-20 MED ORDER — PREDNISONE 20 MG PO TABS: 20.0000 mg | ORAL_TABLET | Freq: Every day | ORAL | 0 refills | Status: DC

## 2018-01-20 NOTE — Patient Instructions (Addendum)
Prednisone 20 mg daily  X 5 days  Labs today   repeat chest CT in 3 months  MRI cervical spine ASAP    Wound center  For management of your wound eschar   I am petitioning  Your insurance for coverage of Repatha   For cholesterol management   Please consider adding amlodipine 2.5 mg daily to get your blood pressure down to 120/70.  You could also  try increasing   Your Imdur to 90 mg daily as an alternative
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
## 2018-01-22 DIAGNOSIS — E1169 Type 2 diabetes mellitus with other specified complication: Secondary | ICD-10-CM | POA: Insufficient documentation

## 2018-01-22 DIAGNOSIS — E785 Hyperlipidemia, unspecified: Principal | ICD-10-CM

## 2018-01-22 MED ORDER — EVOLOCUMAB 140 MG/ML ~~LOC~~ SOAJ: 140.0000 mg | SUBCUTANEOUS | 11 refills | Status: DC
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
## 2018-01-22 NOTE — Assessment & Plan Note (Addendum)
She continues to have mild substernal chest pain which occurs at rest and with emotional stress. She is s/p  stent placement. x2 in the RCA   has not had post procedure stress test or echocardiogram. She has not tried sublingual nitroglycerin for her pain. He follows up with Dr. Clayborn Bigness next month.

## 2018-01-22 NOTE — Assessment & Plan Note (Signed)
Well controlled on current regimen. Renal function stable, no changes today.  Lab Results  Component Value Date   CREATININE 0.56 01/20/2018   Lab Results  Component Value Date   NA 138 01/20/2018   K 3.7 01/20/2018   CL 100 01/20/2018   CO2 26 01/20/2018

## 2018-01-22 NOTE — Assessment & Plan Note (Signed)
Now with radiculopathy to left arm.  Cervical spine MRI ordered

## 2018-01-22 NOTE — Assessment & Plan Note (Signed)
well-controlled on carbohydrate restricted diet.   hemoglobin A1c has been consistently at or  less than 7.0 . Patient is up-to-date on eye exams and foot exam is normal today. Patient is due for urine microalbumin to creatinine ratio at next visit. Patient is intolerant of  statin therapy for CAD risk reduction.  She is taking  ACE/ARB for reduction in proteinuria.    Lab Results  Component Value Date   HGBA1C 6.5 01/20/2018   Lab Results  Component Value Date   CHOL 193 06/08/2017   HDL 36.40 (L) 06/08/2017   LDLCALC 124 (H) 06/08/2017   LDLDIRECT 123.0 01/20/2018   TRIG 163.0 (H) 06/08/2017   CHOLHDL 5 06/08/2017   Lab Results  Component Value Date   MICROALBUR <0.7 01/20/2018

## 2018-01-22 NOTE — Assessment & Plan Note (Signed)
Untreated despite CAD due to statin intolerance on multiple trials.  Repatha is indicated.

## 2018-01-26 NOTE — Telephone Encounter (Signed)
Referral was sent on 01/17/2018.

## 2018-01-28 ENCOUNTER — Ambulatory Visit
Admission: RE | Admit: 2018-01-28 | Discharge: 2018-01-28 | Disposition: A | Discharge status: Home / Self Care | Payer: Medicare Other | Source: Ambulatory Visit | Attending: Internal Medicine | Admitting: Internal Medicine

## 2018-01-28 ENCOUNTER — Ambulatory Visit: Payer: Medicare Other

## 2018-01-28 ENCOUNTER — Encounter: Payer: Medicare Other | Attending: Physician Assistant | Admitting: Physician Assistant

## 2018-01-28 DIAGNOSIS — M549 Dorsalgia, unspecified: Secondary | ICD-10-CM | POA: Diagnosis not present

## 2018-01-28 DIAGNOSIS — E1151 Type 2 diabetes mellitus with diabetic peripheral angiopathy without gangrene: Secondary | ICD-10-CM | POA: Insufficient documentation

## 2018-01-28 DIAGNOSIS — Z853 Personal history of malignant neoplasm of breast: Secondary | ICD-10-CM | POA: Insufficient documentation

## 2018-01-28 DIAGNOSIS — I2510 Atherosclerotic heart disease of native coronary artery without angina pectoris: Secondary | ICD-10-CM | POA: Insufficient documentation

## 2018-01-28 DIAGNOSIS — M5030 Other cervical disc degeneration, unspecified cervical region: Secondary | ICD-10-CM | POA: Insufficient documentation

## 2018-01-28 DIAGNOSIS — T2131XA Burn of third degree of chest wall, initial encounter: Secondary | ICD-10-CM | POA: Diagnosis not present

## 2018-01-28 DIAGNOSIS — I110 Hypertensive heart disease with heart failure: Secondary | ICD-10-CM | POA: Diagnosis not present

## 2018-01-28 DIAGNOSIS — M50223 Other cervical disc displacement at C6-C7 level: Secondary | ICD-10-CM | POA: Diagnosis not present

## 2018-01-28 DIAGNOSIS — I252 Old myocardial infarction: Secondary | ICD-10-CM | POA: Insufficient documentation

## 2018-01-28 DIAGNOSIS — I509 Heart failure, unspecified: Secondary | ICD-10-CM | POA: Diagnosis not present

## 2018-01-28 DIAGNOSIS — E119 Type 2 diabetes mellitus without complications: Secondary | ICD-10-CM | POA: Diagnosis not present

## 2018-01-28 DIAGNOSIS — M542 Cervicalgia: Secondary | ICD-10-CM | POA: Diagnosis not present

## 2018-01-29 NOTE — Progress Notes (Signed)
Stacie Mcdonald, Thao T. ([id number]) Visit Report for 01/28/2018 Abuse/Suicide Risk Screen Details Patient Name: Stacie Mcdonald, Stacie T. Date of Service: 01/28/2018 12:30 PM Medical Record Number: [id number] Patient Account Number: [id number] Date of Birth/Sex: October 08, 1946 (72 y.o. Female) Treating RN: Roger Shelter Primary Care Peace Jost: Deborra Medina Other Clinician: Referring Rance Smithson: Deborra Medina Treating Archana Eckman/Extender: Melburn Hake, HOYT Weeks in Treatment: 0 Abuse/Suicide Risk Screen Items Answer ABUSE/SUICIDE RISK SCREEN: Has anyone close to you tried to hurt or harm you recentlyo No Do you feel uncomfortable with anyone in your familyo No Has anyone forced you do things that you didnot want to doo No Do you have any thoughts of harming yourselfo No Patient displays signs or symptoms of abuse and/or neglect. No Electronic Signature(s) Signed: 01/28/2018 3:21:12 PM By: Roger Shelter Entered By: Roger Shelter on 01/28/2018 12:57:31 Stacie Mcdonald, Stacie Mcdonald ([id number]) -------------------------------------------------------------------------------- Activities of Daily Living Details Patient Name: Stacie Mcdonald, Stacie T. Date of Service: 01/28/2018 12:30 PM Medical Record Number: [id number] Patient Account Number: [id number] Date of Birth/Sex: 1946-05-17 (72 y.o. Female) Treating RN: Roger Shelter Primary Care Vaden Becherer: Deborra Medina Other Clinician: Referring London Tarnowski: Deborra Medina Treating Ica Daye/Extender: Melburn Hake, HOYT Weeks in Treatment: 0 Activities of Daily Living Items Answer Activities of Daily Living (Please select one for each item) Drive Automobile Completely Able Take Medications Completely Able Use Telephone Completely Able Care for Appearance Completely Able Use Toilet Completely Able Bath / Shower Completely Able Dress Self Completely Able Feed Self Completely Able Walk Completely Able Get In / Out Bed Completely Able Housework Completely Able Prepare Meals  Completely Able Handle Money Completely Able Shop for Self Completely Able Electronic Signature(s) Signed: 01/28/2018 3:21:12 PM By: Roger Shelter Entered By: Roger Shelter on 01/28/2018 12:57:54 Stacie Mcdonald, Stacie Mcdonald ([id number]) -------------------------------------------------------------------------------- Education Assessment Details Patient Name: Stacie Mcdonald, Stacie T. Date of Service: 01/28/2018 12:30 PM Medical Record Number: [id number] Patient Account Number: [id number] Date of Birth/Sex: Apr 17, 1946 (72 y.o. Female) Treating RN: Roger Shelter Primary Care Refoel Palladino: Deborra Medina Other Clinician: Referring Evalina Tabak: Deborra Medina Treating Sinclaire Artiga/Extender: Melburn Hake, HOYT Weeks in Treatment: 0 Primary Learner Assessed: Patient Learning Preferences/Education Level/Primary Language Learning Preference: Explanation Highest Education Level: College or Above Preferred Language: English Cognitive Barrier Assessment/Beliefs Language Barrier: No Translator Needed: No Memory Deficit: No Emotional Barrier: No Cultural/Religious Beliefs Affecting Medical Care: No Physical Barrier Assessment Impaired Vision: Yes Contacts Impaired Hearing: No Decreased Hand dexterity: No Knowledge/Comprehension Assessment Knowledge Level: High Comprehension Level: High Ability to understand written High instructions: Ability to understand verbal High instructions: Motivation Assessment Anxiety Level: Calm Cooperation: Cooperative Education Importance: Acknowledges Need Interest in Health Problems: Asks Questions Perception: Coherent Willingness to Engage in Self- High Management Activities: Readiness to Engage in Self- High Management Activities: Electronic Signature(s) Signed: 01/28/2018 3:21:12 PM By: Roger Shelter Entered By: Roger Shelter on 01/28/2018 12:58:32 Stacie Mcdonald, Stacie Mcdonald ([id number]) -------------------------------------------------------------------------------- Fall  Risk Assessment Details Patient Name: Stacie Mcdonald, Stacie T. Date of Service: 01/28/2018 12:30 PM Medical Record Number: [id number] Patient Account Number: [id number] Date of Birth/Sex: [birth year] (72 y.o. Female) Treating RN: Roger Shelter Primary Care Latha Staunton: Deborra Medina Other Clinician: Referring Breeona Waid: Deborra Medina Treating Nabor Thomann/Extender: Melburn Hake, HOYT Weeks in Treatment: 0 Fall Risk Assessment Items Have you had 2 or more falls in the last 12 monthso 0 No Have you had any fall that resulted in injury in the last 12 monthso 0 No FALL RISK ASSESSMENT: History of falling - immediate or within 3 months 0 No Secondary diagnosis 0 No Ambulatory aid None/bed rest/wheelchair/nurse 0 No Crutches/cane/walker 0 No Furniture  0 No IV Access/Saline Lock 0 No Gait/Training Normal/bed rest/immobile 0 No Weak 0 No Impaired 0 No Mental Status Oriented to own ability 0 No Electronic Signature(s) Signed: 01/28/2018 3:21:12 PM By: Roger Shelter Entered By: Roger Shelter on 01/28/2018 12:58:50 Stacie Mcdonald, Stacie Mcdonald ([id number]) -------------------------------------------------------------------------------- Nutrition Risk Assessment Details Patient Name: Stacie Mcdonald, Stacie T. Date of Service: 01/28/2018 12:30 PM Medical Record Number: [id number] Patient Account Number: [id number] Date of Birth/Sex: 21-Dec-1945 (72 y.o. Female) Treating RN: Roger Shelter Primary Care Myrna Vonseggern: Deborra Medina Other Clinician: Referring Lyrique Hakim: Deborra Medina Treating Rakeem Colley/Extender: Melburn Hake, HOYT Weeks in Treatment: 0 Height (in): 64 Weight (lbs): 183 Body Mass Index (BMI): 31.4 Nutrition Risk Assessment Items NUTRITION RISK SCREEN: I have an illness or condition that made me change the kind and/or amount of 0 No food I eat I eat fewer than two meals per day 0 No I eat few fruits and vegetables, or milk products 0 No I have three or more drinks of beer, liquor or wine almost every day 0  No I have tooth or mouth problems that make it hard for me to eat 0 No I don't always have enough money to buy the food I need 0 No I eat alone most of the time 0 No I take three or more different prescribed or over-the-counter drugs a day 0 No Without wanting to, I have lost or gained 10 pounds in the last six months 0 No I am not always physically able to shop, cook and/or feed myself 0 No Nutrition Protocols Good Risk Protocol 0 No interventions needed Moderate Risk Protocol Electronic Signature(s) Signed: 01/28/2018 3:21:12 PM By: Roger Shelter Entered By: Roger Shelter on 01/28/2018 12:59:02

## 2018-01-30 NOTE — Progress Notes (Signed)
Stacie Mcdonald, Taneshia T. ([id number]) Visit Report for 01/28/2018 Chief Complaint Document Details Patient Name: Stacie Mcdonald, Erminia T. Date of Service: 01/28/2018 12:30 PM Medical Record Number: [id number] Patient Account Number: [id number] Date of Birth/Sex: 1946/07/16 (72 y.o. Female) Treating RN: Montey Hora Primary Care Provider: Deborra Medina Other Clinician: Referring Provider: Deborra Medina Treating Provider/Extender: Melburn Hake,  Weeks in Treatment: 0 Information Obtained from: Patient Chief Complaint Left breast burn Electronic Signature(s) Signed: 01/28/2018 6:13:52 PM By: Worthy Keeler PA-C Entered By: Worthy Keeler on 01/28/2018 18:05:16 Stacie Mcdonald, Jenetta Downer ([id number]) -------------------------------------------------------------------------------- HPI Details Patient Name: Stacie Mcdonald, Tonna T. Date of Service: 01/28/2018 12:30 PM Medical Record Number: [id number] Patient Account Number: [id number] Date of Birth/Sex: 03-07-1946 (72 y.o. Female) Treating RN: Montey Hora Primary Care Provider: Deborra Medina Other Clinician: Referring Provider: Deborra Medina Treating Provider/Extender: Melburn Hake,  Weeks in Treatment: 0 History of Present Illness HPI Description: 01/28/18 on evaluation today patient presents for initial evaluation concerning a burn to the right breast which occurred roughly 5 weeks ago according to the patient when she fell asleep due to her back pain with a heating pad on her back. Due to the lack of sensation in her breast she subsequently during the night rolled over onto the breast and this caused the burn that she is now been dealing with in the meantime. Unfortunately she still continue to have the back pain as well. According to her records it is stated that she has type II diabetes mellitus which is diet controlled and she's not checking her blood sugars at this point her most recent hemoglobin A1c with 6.5. On evaluation today she does have a eschar  covering the majority the surface of the wound at this point and she does not seem to have any significant discomfort although she does have some pain. Patient also does have high blood pressure which is generally fairly well controlled. She was placed on doxycycline. However at this point she has completed that course of therapy. She does have a history of right breast mastectomy secondary to breast cancer which she had in 1991. She separately had a reconstruction surgery in 1993. She was also given Bactroban ointment by Dr. Derrel Nip who has been managing the burn up to this point. This was in conjunction with the doxycycline. Again fortunately patient does not seem to have significant pain at this point. Electronic Signature(s) Signed: 01/28/2018 6:13:52 PM By: Worthy Keeler PA-C Entered By: Worthy Keeler on 01/28/2018 18:05:27 Stacie Mcdonald, Jenetta Downer ([id number]) -------------------------------------------------------------------------------- Burn Debridement: Large Details Patient Name: Stacie Mcdonald, Kemper T. Date of Service: 01/28/2018 12:30 PM Medical Record Number: [id number] Patient Account Number: [id number] Date of Birth/Sex: 08-Jan-1946 (72 y.o. Female) Treating RN: Montey Hora Primary Care Provider: Deborra Medina Other Clinician: Referring Provider: Deborra Medina Treating Provider/Extender: Melburn Hake,  Weeks in Treatment: 0 Procedure Performed for: Wound #1 Right Breast Performed By: Physician Emilio Math., PA-C Post Procedure Diagnosis Same as Pre-procedure Notes Debridement Details Patient Name: Stacie Mcdonald, Moksha T. Medical Record Number: [id number] Date of Birth/Sex: [birth year] (72 y.o. Female) Primary Care Provider: Deborra Medina Referring Provider: Solon Augusta in Treatment: 0 Date of Service: 01/28/2018 12:30 PM Patient Account Number: [id number] Treating RN: Montey Hora Other Clinician: Treating Provider/Extender: Melburn Hake,  Debridement Performed for  Assessment: Wound #1 Right Breast Performed By: Physician STONE III,  E., PA-C Debridement: Debridement Pre-procedure Verification/Time Out Taken: Yes - 13:32 Start Time: 13:32 Pain Control: Lidocaine 4% Topical Solution Level: Skin/Subcutaneous Tissue Total Area Debrided (L x  W): 4.9 (cm) x 5.9 (cm) = 28.91 (cmo) Tissue and other material debrided: Viable, Non-Viable, Eschar, Fibrin/Slough, Subcutaneous Instrument: Forceps, Scissors Bleeding: Minimum Hemostasis Achieved: Pressure End Time: 13:42 Procedural Pain: 0 Post Procedural Pain: 0 Response to Treatment: Procedure was tolerated well Post Debridement Measurements of Total Wound Length: (cm) 4.9 Width: (cm) 5.9 Depth: (cm) 0.2 Volume: (cmo) 4.541 Character of Wound/Ulcer Post Debridement: Improved Post Procedure Diagnosis Same as Pre-procedure Electronic Signature(s) Unsigned Stacie Mcdonald, Jacqualin T. ([id number]) Entered By: Montey Hora on 01/28/2018 13:42:06 Signature(s): Date(s): Electronic Signature(s) Signed: 01/28/2018 1:52:59 PM By: Montey Hora Entered By: Montey Hora on 01/28/2018 13:52:59 Stacie Mcdonald, Jenetta Downer ([id number]) -------------------------------------------------------------------------------- Physical Exam Details Patient Name: Stacie Mcdonald, Anistyn T. Date of Service: 01/28/2018 12:30 PM Medical Record Number: [id number] Patient Account Number: [id number] Date of Birth/Sex: [birth year] (72 y.o. Female) Treating RN: Montey Hora Primary Care Provider: Deborra Medina Other Clinician: Referring Provider: Deborra Medina Treating Provider/Extender: Melburn Hake,  Weeks in Treatment: 0 Constitutional patient is hypertensive.. pulse regular and within target range for patient.Marland Kitchen respirations regular, non-labored and within target range for patient.Marland Kitchen temperature within target range for patient.. Well-nourished and well-hydrated in no acute distress. Eyes conjunctiva clear no eyelid edema noted. pupils equal round  and reactive to light and accommodation. Ears, Nose, Mouth, and Throat no gross abnormality of ear auricles or external auditory canals. normal hearing noted during conversation. mucus membranes moist. Respiratory normal breathing without difficulty. clear to auscultation bilaterally. Cardiovascular regular rate and rhythm with normal S1, S2. no clubbing, cyanosis, significant edema, <3 sec cap refill. Gastrointestinal (GI) soft, non-tender, non-distended, +BS. no ventral hernia noted. Musculoskeletal normal gait and posture. no significant deformity or arthritic changes, no loss or range of motion, no clubbing. Psychiatric this patient is able to make decisions and demonstrates good insight into disease process. Alert and Oriented x 3. pleasant and cooperative. Notes On evaluation today in regard to the wound on the right lateral breast this appears to be mostly eschar covered although subcutaneous tissue was noted underlined. I explained to the patient that obviously I could not tell exactly how deep this goes until debridement was performed. I discussed the risk and benefits of debridement and she did sign consent to proceed in that regard today. Fortunately as we began to debride the wound it became apparent that she Artie has a lot of healing which is excellent news that has occurred in the interim since this was first injured. She does however have tissue exposure down to fat layer but this does not seem to be any deeper than that. She did have some discomfort with debridement we used been the cane spray to help numb this region and overall she tolerated this very well. Post debridement the wound bed appear to be doing much better. Electronic Signature(s) Signed: 01/28/2018 6:13:52 PM By: Worthy Keeler PA-C Entered By: Worthy Keeler on 01/28/2018 18:06:38 Stacie Mcdonald, Jenetta Downer ([id number]) -------------------------------------------------------------------------------- Physician  Orders Details Patient Name: Stacie Mcdonald, Vincentina T. Date of Service: 01/28/2018 12:30 PM Medical Record Number: [id number] Patient Account Number: [id number] Date of Birth/Sex: 03-18-1946 (72 y.o. Female) Treating RN: Montey Hora Primary Care Provider: Deborra Medina Other Clinician: Referring Provider: Deborra Medina Treating Provider/Extender: Melburn Hake,  Weeks in Treatment: 0 Verbal / Phone Orders: No Diagnosis Coding Wound Cleansing Wound #1 Right Breast o Clean wound with Normal Saline. o May Shower, gently pat wound dry prior to applying new dressing. Anesthetic (add to Medication List) Wound #1 Right Breast o Topical Lidocaine 4% cream applied to wound  bed prior to debridement (In Clinic Only). o Hurricaine Topical Anesthetic Spray applied to wound bed prior to debridement (In Clinic Only). Primary Wound Dressing Wound #1 Right Breast o Xeroform Secondary Dressing Wound #1 Right Breast o Boardered Foam Dressing o Non-adherent pad Dressing Change Frequency Wound #1 Right Breast o Change dressing every other day. Follow-up Appointments Wound #1 Right Breast o Return Appointment in 1 week. Additional Orders / Instructions Wound #1 Right Breast o Vitamin A; Vitamin C, Zinc - Please add a multivitamin with 100% of these vitamins and minerals o Increase protein intake. Electronic Signature(s) Signed: 01/28/2018 3:20:05 PM By: Montey Hora Signed: 01/28/2018 6:13:52 PM By: Worthy Keeler PA-C Entered By: Montey Hora on 01/28/2018 13:44:10 Stacie Mcdonald, Jenetta Downer ([id number]) -------------------------------------------------------------------------------- Problem List Details Patient Name: Stacie Mcdonald, Jisela T. Date of Service: 01/28/2018 12:30 PM Medical Record Number: [id number] Patient Account Number: [id number] Date of Birth/Sex: Apr 09, 1946 (72 y.o. 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 Date of Service: 01/28/2018 12:30 PM Medical Record Number: [id number] Patient Account Number: [id number] Date of Birth/Sex: 19-Aug-1946 (72 y.o. Female) Treating RN: Montey Hora Primary Care Provider: Deborra Medina Other Clinician: Referring Provider: Deborra Medina Treating Provider/Extender: Melburn Hake,  Weeks in Treatment: 0 Subjective Chief Complaint Information obtained from Patient Left breast burn History of Present Illness (HPI) 01/28/18 on evaluation today patient presents for initial evaluation concerning a burn to the right breast which occurred roughly 5 weeks ago according to the patient when she fell asleep due to her back pain with a heating pad on her back. Due to the lack of sensation in her breast she subsequently during the night rolled over onto the breast and this caused the burn that she is now been dealing with in the meantime. Unfortunately she still continue to have the back pain as well. According to her records it is stated that she has type II diabetes mellitus which is diet controlled and she's not checking her blood sugars at this point her most recent hemoglobin A1c with 6.5. On evaluation today she does have a eschar covering the majority the surface of  the wound at this point and she does not seem to have any significant discomfort although she does have some pain. Patient also does have high blood pressure which is generally fairly well controlled. She was placed on doxycycline. However at this point she has completed that course of therapy. She does have a history of right breast mastectomy secondary to breast cancer which she had in 1991. She separately had a reconstruction surgery in 1993. She was also given Bactroban ointment by Dr. Derrel Nip who has been managing the burn up to this point. This was in conjunction with the doxycycline. Again fortunately patient does not seem to have significant pain at this point. Wound History Patient presents with 1 open wound that has been present for approximately 5 weeks. Patient has been treating wound in the following manner: bactroban. Laboratory tests  have not been performed in the last month. Patient reportedly has not tested positive for an antibiotic resistant organism. Patient reportedly has not tested positive for osteomyelitis. Patient reportedly has not had testing performed to evaluate circulation in the legs. Patient History Information obtained from Patient. Allergies penicillin Family History Cancer - Father, Diabetes - Siblings, Heart Disease - Mother,Father, Hypertension - Mother, Kidney Disease - Siblings, Lung Disease - Father, Stroke - Father, Thyroid Problems - Siblings, No family history of Seizures, Tuberculosis. Social History Never smoker, Marital Status - Married, Alcohol Use - Rarely, Drug Use - No History, Caffeine Use - Daily. Medical History Eyes Denies history of Cataracts, Glaucoma, Optic Neuritis Ear/Nose/Mouth/Throat Denies history of Chronic sinus problems/congestion, Middle ear problems Hematologic/Lymphatic Stacie Mcdonald, Ezmae T. ([id number]) Denies history of Anemia, Hemophilia, Human Immunodeficiency Virus, Lymphedema, Sickle Cell Disease Respiratory Denies  history of Aspiration, Asthma, Pneumothorax, Sleep Apnea, Tuberculosis Cardiovascular Patient has history of Arrhythmia, Congestive Heart Failure, Coronary Artery Disease, Hypertension, Myocardial Infarction Denies history of Angina, Deep Vein Thrombosis, Hypotension, Peripheral Arterial Disease, Peripheral Venous Disease, Phlebitis, Vasculitis Gastrointestinal Denies history of Cirrhosis , Colitis, Crohn s, Hepatitis A, Hepatitis B, Hepatitis C Immunological Denies history of Lupus Erythematosus, Raynaud s, Scleroderma Integumentary (Skin) Denies history of History of Burn, History of pressure wounds Musculoskeletal Patient has history of Osteoarthritis Denies history of Gout, Rheumatoid Arthritis, Osteomyelitis Neurologic Denies history of Dementia, Neuropathy, Quadriplegia, Paraplegia, Seizure Disorder Oncologic Patient has history of Received Chemotherapy - 1991, Received Radiation - 1991 Medical And Surgical History Notes 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 Eyes Complains or has symptoms of Glasses / Contacts - left contact. Denies complaints or symptoms of Dry Eyes, Vision Changes. Ear/Nose/Mouth/Throat Denies complaints or symptoms of Difficult clearing ears, Sinusitis. Hematologic/Lymphatic Denies complaints or symptoms of Bleeding / Clotting Disorders, Human Immunodeficiency Virus. Respiratory Complains or has symptoms of Shortness of Breath. Denies complaints or symptoms of Chronic or frequent coughs. Cardiovascular Denies complaints or symptoms of Chest pain, LE edema. Gastrointestinal Denies complaints or symptoms of Frequent diarrhea, Nausea, Vomiting. Endocrine Denies complaints or symptoms of Hepatitis, Thyroid disease, Polydypsia (Excessive Thirst). Genitourinary The patient has no complaints or  symptoms. Immunological Denies complaints or symptoms of Hives, Itching. Integumentary (Skin) Complains or has symptoms of Wounds. Denies complaints or symptoms of Bleeding or bruising tendency, Breakdown, Swelling. Musculoskeletal Denies complaints or symptoms of Muscle Pain, Muscle Weakness. Neurologic Denies complaints or symptoms of Numbness/parasthesias, Focal/Weakness. Oncologic right breast cancer with mastectomy Stacie Mcdonald, Quynh T. ([id number]) Objective Constitutional patient is hypertensive.. pulse regular and within target range for patient.Marland Kitchen respirations regular, non-labored and within target range for patient.Marland Kitchen temperature within target range for patient.. Well-nourished and well-hydrated in no acute distress. Vitals Time Taken: 12:46 PM, Height: 64 in, Source: Stated, Weight: 183 lbs, Source: Measured, BMI: 31.4, Temperature: 98.1 F, Pulse: 82 bpm, Respiratory Rate: 18 breaths/min, Blood Pressure: 140/78 mmHg. Eyes conjunctiva clear no eyelid edema noted. pupils equal round and reactive to light and accommodation. Ears, Nose, Mouth, and Throat no gross abnormality of ear auricles or external auditory canals. normal hearing noted during conversation. mucus membranes moist. Respiratory normal breathing without difficulty. clear to auscultation bilaterally. Cardiovascular regular rate and rhythm with normal S1, S2. no clubbing, cyanosis, significant edema, Gastrointestinal (GI) soft, non-tender, non-distended, +BS. no ventral hernia noted. Musculoskeletal normal gait and posture. no significant deformity or arthritic changes, no loss or range of motion, no  clubbing. Psychiatric this patient is able to make decisions and demonstrates good insight into disease process. Alert and Oriented x 3. pleasant and cooperative. General Notes: On evaluation today in regard to the wound on the right lateral breast this appears to be mostly eschar covered although subcutaneous tissue  was noted underlined. I explained to the patient that obviously I could not tell exactly how deep this goes until debridement was performed. I discussed the risk and benefits of debridement and she did sign consent to proceed in that regard today. Fortunately as we began to debride the wound it became apparent that she Artie has a lot of healing which is excellent news that has occurred in the interim since this was first injured. She does however have tissue exposure down to fat layer but this does not seem to be any deeper than that. She did have some discomfort with debridement we used been the cane spray to help numb this region and overall she tolerated this very well. Post debridement the wound bed appear to be doing much better. Integumentary (Hair, Skin) Wound #1 status is Open. Original cause of wound was Thermal Burn. The wound is located on the Right Breast. The wound measures 4.9cm length x 5.9cm width x 0.1cm depth; 22.706cm^2 area and 2.271cm^3 volume. There is Fat Layer (Subcutaneous Tissue) Exposed exposed. There is no tunneling or undermining noted. There is a medium amount of serosanguineous drainage noted. The wound margin is distinct with the outline attached to the wound base. There is no granulation within the wound bed. There is a large (67-100%) amount of necrotic tissue within the wound bed including Eschar. The periwound skin appearance exhibited: Dry/Scaly. The periwound skin appearance did not exhibit: Callus, Crepitus, Excoriation, Induration, Rash, Scarring, Maceration, Atrophie Blanche, Cyanosis, Ecchymosis, Hemosiderin Staining, Mottled, Pallor, Rubor, Erythema. Stacie Mcdonald, Keyunna T. 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 An Burn Debridement: Large procedure was performed by STONE III,  E., PA-C. Post procedure Diagnosis Wound #1: Same as Pre-Procedure Notes: Debridement Details Patient Name: Stacie Mcdonald, Scout T. Medical Record Number: [id number] Date of Birth/Sex: 1946/10/27 (72 y.o. Female) Primary Care Provider: Deborra Medina Referring Provider: Solon Augusta in Treatment: 0 Date of Service: 01/28/2018 12:30 PM Patient Account Number: [id number] Treating RN: Montey Hora Other Clinician: Treating Provider/Extender: Melburn Hake,  Debridement Performed for Assessment: Wound #1 Right Breast Performed By: Physician STONE III,  E., PA-C Debridement: Debridement Pre-procedure Verification/Time Out Taken: Yes - 13:32 Start Time: 13:32 Pain Control: Lidocaine 4% Topical Solution Level: Skin/Subcutaneous Tissue Total Area Debrided (L x W): 4.9 (cm) x 5.9 (cm) = 28.91 (cm) Tissue and other material debrided: Viable, Non-Viable, Eschar, Fibrin/Slough, Subcutaneous Instrument: Forceps, Scissors Bleeding: Minimum Hemostasis Achieved: Pressure End Time: 13:42 Procedural Pain: 0 Post Procedural Pain: 0 Response to Treatment: Procedure was tolerated well Post Debridement Measurements of Total Wound Length: (cm) 4.9 Width: (cm) 5.9 Depth: (cm) 0.2 Volume: (cm) 4.541 Character of Wound/Ulcer Post Debridement: Improved Post Procedure Diagnosis Same as Pre-procedure Electronic Signature(s) Unsigned Entered By: Montey Hora on 01/28/2018 13:42:06 Signature(s): Date(s): Plan Wound Cleansing: Wound #1 Right Breast: Clean wound with Normal Saline. May Shower, gently pat wound dry prior to applying new dressing. Anesthetic (add to Medication List): Wound #  1 Right Breast: Topical Lidocaine 4% cream applied to wound bed prior to debridement (In Clinic Only). Hurricaine Topical Anesthetic Spray applied to wound bed prior to debridement (In Clinic Only). Primary Wound  Dressing: Wound #1 Right Breast: Xeroform Secondary Dressing: Wound #1 Right Breast: Boardered Foam Dressing Non-adherent pad Dressing Change Frequency: Wound #1 Right Breast: Stacie Mcdonald, Dossie T. ([id number]) Change dressing every other day. Follow-up Appointments: Wound #1 Right Breast: Return Appointment in 1 week. Additional Orders / Instructions: Wound #1 Right Breast: Vitamin A; Vitamin C, Zinc - Please add a multivitamin with 100% of these vitamins and minerals Increase protein intake. Currently I think that the best treatment would be utilizing Xeroform in order to help keep the wound bed moist as I do not want to dry out again as it was today. We are gonna see how things do over the next week utilizing the Xeroform covered with a Boarder Foam Dressing. I explained to the patient that she can cleanse this area with warm soap and water. I feel this is likely going to progress well. Please see above for specific wound care orders. We will see patient for re-evaluation in 1 week(s) here in the clinic. If anything worsens or changes patient will contact our office for additional recommendations. Electronic Signature(s) Signed: 01/28/2018 6:13:52 PM By: Worthy Keeler PA-C Entered By: Worthy Keeler on 01/28/2018 18:08:11 Stacie Mcdonald, Jenetta Downer ([id number]) -------------------------------------------------------------------------------- ROS/PFSH Details Patient Name: Stacie Mcdonald, Ceyda T. Date of Service: 01/28/2018 12:30 PM Medical Record Number: [id number] Patient Account Number: [id number] Date of Birth/Sex: [birth year] (72 y.o. Female) Treating RN: Roger Shelter Primary Care Provider: Deborra Medina Other Clinician: Referring Provider: Deborra Medina Treating Provider/Extender: Melburn Hake,  Weeks in Treatment: 0 Information Obtained From Patient Wound History Do you currently have one or more open woundso Yes How many open wounds do you currently haveo 1 Approximately how long  have you had your woundso 5 weeks How have you been treating your wound(s) until nowo bactroban Has your wound(s) ever healed and then re-openedo No Have you had any lab work done in the past montho No Have you tested positive for an antibiotic resistant organism (MRSA, VRE)o No Have you tested positive for osteomyelitis (bone infection)o No Have you had any tests for circulation on your legso No Constitutional Symptoms (General Health) Complaints and Symptoms: Negative for: Fatigue; Fever; Chills; Marked Weight Change Eyes Complaints and Symptoms: Positive for: Glasses / Contacts - left contact Negative for: Dry Eyes; Vision Changes Medical History: Negative for: Cataracts; Glaucoma; Optic Neuritis Ear/Nose/Mouth/Throat Complaints and Symptoms: Negative for:

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
## 2018-02-01 ENCOUNTER — Encounter: Payer: Self-pay | Admitting: Internal Medicine

## 2018-02-01 ENCOUNTER — Ambulatory Visit: Payer: Medicare Other | Admitting: Internal Medicine

## 2018-02-01 DIAGNOSIS — M4802 Spinal stenosis, cervical region: Secondary | ICD-10-CM

## 2018-02-03 DIAGNOSIS — M4802 Spinal stenosis, cervical region: Secondary | ICD-10-CM | POA: Insufficient documentation

## 2018-02-04 ENCOUNTER — Encounter: Payer: Medicare Other | Admitting: Physician Assistant

## 2018-02-04 DIAGNOSIS — I2510 Atherosclerotic heart disease of native coronary artery without angina pectoris: Secondary | ICD-10-CM | POA: Diagnosis not present

## 2018-02-04 DIAGNOSIS — I252 Old myocardial infarction: Secondary | ICD-10-CM | POA: Diagnosis not present

## 2018-02-04 DIAGNOSIS — L98492 Non-pressure chronic ulcer of skin of other sites with fat layer exposed: Secondary | ICD-10-CM | POA: Diagnosis not present

## 2018-02-04 DIAGNOSIS — I110 Hypertensive heart disease with heart failure: Secondary | ICD-10-CM | POA: Diagnosis not present

## 2018-02-04 DIAGNOSIS — Z853 Personal history of malignant neoplasm of breast: Secondary | ICD-10-CM | POA: Diagnosis not present

## 2018-02-04 DIAGNOSIS — E1151 Type 2 diabetes mellitus with diabetic peripheral angiopathy without gangrene: Secondary | ICD-10-CM | POA: Diagnosis not present

## 2018-02-04 DIAGNOSIS — I509 Heart failure, unspecified: Secondary | ICD-10-CM | POA: Diagnosis not present

## 2018-02-06 NOTE — Progress Notes (Signed)
Martinique, Shamyia T. ([id number]) Visit Report for 02/04/2018 Chief Complaint Document Details Patient Name: Martinique, Nayla T. Date of Service: 02/04/2018 9:45 AM Medical Record Number: [id number] Patient Account Number: [id number] Date of Birth/Sex: 10/14/1946 (72 y.o. F) Treating RN: Montey Hora Primary Care Provider: Deborra Medina Other Clinician: Referring Provider: Deborra Medina Treating Provider/Extender: Melburn Hake, HOYT Weeks in Treatment: 1 Information Obtained from: Patient Chief Complaint Left breast burn Electronic Signature(s) Signed: 02/06/2018 11:36:05 PM By: Worthy Keeler PA-C Entered By: Worthy Keeler on 02/04/2018 09:55:12 Martinique, Jenetta Downer ([id number]) -------------------------------------------------------------------------------- HPI Details Patient Name: Martinique, Kahlan T. Date of Service: 02/04/2018 9:45 AM Medical Record Number: [id number] Patient Account Number: [id number] Date of Birth/Sex: 1946/01/13 (72 y.o. F) Treating RN: Montey Hora Primary Care Provider: Deborra Medina Other Clinician: Referring Provider: Deborra Medina Treating Provider/Extender: Melburn Hake, HOYT Weeks in Treatment: 1 History of Present Illness HPI Description: 01/28/18 on evaluation today patient presents for initial evaluation concerning a burn to the right breast which occurred roughly 5 weeks ago according to the patient when she fell asleep due to her back pain with a heating pad on her back. Due to the lack of sensation in her breast she subsequently during the night rolled over onto the breast and this caused the burn that she is now been dealing with in the meantime. Unfortunately she still continue to have the back pain as well. According to her records it is stated that she has type II diabetes mellitus which is diet controlled and she's not checking her blood sugars at this point her most recent hemoglobin A1c with 6.5. On evaluation today she does have a eschar covering the  majority the surface of the wound at this point and she does not seem to have any significant discomfort although she does have some pain. Patient also does have high blood pressure which is generally fairly well controlled. She was placed on doxycycline. However at this point she has completed that course of therapy. She does have a history of right breast mastectomy secondary to breast cancer which she had in 1991. She separately had a reconstruction surgery in 1993. She was also given Bactroban ointment by Dr. Derrel Nip who has been managing the burn up to this point. This was in conjunction with the doxycycline. Again fortunately patient does not seem to have significant pain at this point. 02/04/18 on evaluation today patient appears to be doing rather well in regard to her right lateral breast ulcer. This is secondary to a burn which was thermal in nature. With that being said she seems to have a fairly good granular bed at this point does have some slight epithelialization around the edges of the wound although this is slowly healing it does appear to be some maceration noted as well. She does not seem to have any evidence of infection which is good news. Electronic Signature(s) Signed: 02/06/2018 11:36:05 PM By: Worthy Keeler PA-C Entered By: Worthy Keeler on 02/04/2018 13:21:21 Martinique, Syriah T. ([id number]) -------------------------------------------------------------------------------- Physical Exam Details Patient Name: Martinique, Taelynn T. Date of Service: 02/04/2018 9:45 AM Medical Record Number: [id number] Patient Account Number: [id number] Date of Birth/Sex: 12/26/1945 (72 y.o. F) Treating RN: Montey Hora Primary Care Provider: Deborra Medina Other Clinician: Referring Provider: Deborra Medina Treating Provider/Extender: STONE III, HOYT Weeks in Treatment: 1 Constitutional Well-nourished and well-hydrated in no acute distress. Respiratory normal breathing without difficulty. clear  to auscultation bilaterally. Cardiovascular regular rate and rhythm with normal S1, S2. Psychiatric this patient is able  to make decisions and demonstrates good insight into disease process. Alert and Oriented x 3. pleasant and cooperative. Notes Currently there was no debridement necessary today which was good news. I do believe that she is likely going to fare well although I do think we may be able to do some things to help stimulate more rapid epithelialization at this point in time. Electronic Signature(s) Signed: 02/06/2018 11:36:05 PM By: Worthy Keeler PA-C Entered By: Worthy Keeler on 02/04/2018 13:22:26 Martinique, Jenetta Downer ([id number]) -------------------------------------------------------------------------------- Physician Orders Details Patient Name: Martinique, Marquerite T. Date of Service: 02/04/2018 9:45 AM Medical Record Number: [id number] Patient Account Number: [id number] Date of Birth/Sex: [birth year] (72 y.o. F) Treating RN: Montey Hora Primary Care Provider: Deborra Medina Other Clinician: Referring Provider: Deborra Medina Treating Provider/Extender: Melburn Hake, HOYT Weeks in Treatment: 1 Verbal / Phone Orders: No Diagnosis Coding ICD-10 Coding Code Description T31.0 Burns involving less than 10% of body surface L98.492 Non-pressure chronic ulcer of skin of other sites with fat layer exposed I10 Essential (primary) hypertension Wound Cleansing Wound #1 Right Breast o Clean wound with Normal Saline. o May Shower, gently pat wound dry prior to applying new dressing. Anesthetic (add to Medication List) Wound #1 Right Breast o Topical Lidocaine 4% cream applied to wound bed prior to debridement (In Clinic Only). Primary Wound Dressing Wound #1 Right Breast o Silver Alginate Secondary Dressing Wound #1 Right Breast o Boardered Foam Dressing o Non-adherent pad Dressing Change Frequency Wound #1 Right Breast o Change dressing every other  day. Follow-up Appointments Wound #1 Right Breast o Return Appointment in 1 week. Additional Orders / Instructions Wound #1 Right Breast o Vitamin A; Vitamin C, Zinc - Please add a multivitamin with 100% of these vitamins and minerals o Increase protein intake. Electronic Signature(s) Signed: 02/04/2018 4:19:12 PM By: Dorthy, Joanna Martinique, Jenetta Downer ([id number]) Signed: 02/06/2018 11:36:05 PM By: Worthy Keeler PA-C Entered By: Montey Hora on 02/04/2018 10:10:00 Martinique, Hatsuko T. ([id number]) -------------------------------------------------------------------------------- Problem List Details Patient Name: Martinique, Kaysia T. Date of Service: 02/04/2018 9:45 AM Medical Record Number: [id number] Patient Account Number: [id number] Date of Birth/Sex: January 27, 1946 (72 y.o. F) Treating RN: Montey Hora Primary Care Provider: Deborra Medina Other Clinician: Referring Provider: Deborra Medina Treating Provider/Extender: Melburn Hake, HOYT Weeks in Treatment: 1 Active Problems ICD-10 Impacting Encounter Code Description Active Date Wound Healing Diagnosis T31.0 Burns involving less than 10% of body surface 01/28/2018 Yes L98.492 Non-pressure chronic ulcer of skin of other sites with fat layer 01/28/2018 Yes exposed [place]pine (primary) hypertension 01/28/2018 Yes Inactive Problems Resolved Problems Electronic Signature(s) Signed: 02/06/2018 11:36:05 PM By: Worthy Keeler PA-C Entered By: Worthy Keeler on 02/04/2018 09:55:03 Martinique, Jenetta Downer ([id number]) -------------------------------------------------------------------------------- Progress Note Details Patient Name: Martinique, Kylina T. Date of Service: 02/04/2018 9:45 AM Medical Record Number: [id number] Patient Account Number: [id number] Date of Birth/Sex: June 29, 1946 (72 y.o. F) Treating RN: Montey Hora Primary Care Provider: Deborra Medina Other Clinician: Referring Provider: Deborra Medina Treating Provider/Extender: Melburn Hake, HOYT Weeks in Treatment: 1 Subjective Chief Complaint Information obtained from Patient Left breast burn History of Present Illness (HPI) 01/28/18 on evaluation today patient presents for initial evaluation concerning a burn to the right breast which occurred roughly 5 weeks ago according to the patient when she fell asleep due to her back pain with a heating pad on her back. Due to the lack of sensation in her breast she subsequently during the night rolled over onto the breast and this caused the burn that  she is now been dealing with in the meantime. Unfortunately she still continue to have the back pain as well. According to her records it is stated that she has type II diabetes mellitus which is diet controlled and she's not checking her blood sugars at this point her most recent hemoglobin A1c with 6.5. On evaluation today she does have a eschar covering the majority the surface of the wound at this point and she does not seem to have any significant discomfort although she does have some pain. Patient also does have high blood pressure which is generally fairly well controlled. She was placed on doxycycline. However at this point she has completed that course of therapy. She does have a history of right breast mastectomy secondary to breast cancer which she had in 1991. She separately had a reconstruction surgery in 1993. She was also given Bactroban ointment by Dr. Derrel Nip who has been managing the burn up to this point. This was in conjunction with the doxycycline. Again fortunately patient does not seem to have significant pain at this point. 02/04/18 on evaluation today patient appears to be doing rather well in regard to her right lateral breast ulcer. This is secondary to a burn which was thermal in nature. With that being said she seems to have a fairly good granular bed at this point does have some slight epithelialization around the edges of the wound although this is slowly  healing it does appear to be some maceration noted as well. She does not seem to have any evidence of infection which is good news. Patient History Information obtained from Patient. Family History Cancer - Father, Diabetes - Siblings, Heart Disease - Mother,Father, Hypertension - Mother, Kidney Disease - Siblings, Lung Disease - Father, Stroke - Father, Thyroid Problems - Siblings, No family history of Seizures, Tuberculosis. Social History Never smoker, Marital Status - Married, Alcohol Use - Rarely, Drug Use - No History, Caffeine Use - Daily. 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 Respiratory The patient has no complaints or symptoms. Cardiovascular The patient has no complaints or symptoms. Martinique, Corean T. ([id number]) Psychiatric The patient has no complaints or symptoms. Objective Constitutional Well-nourished and well-hydrated in no acute distress. Vitals Time Taken: 9:50 AM, Height: 64 in, Weight: 183 lbs, BMI: 31.4, Temperature: 98.3 F, Pulse: 91 bpm, Respiratory Rate: 18 breaths/min, Blood Pressure: 158/78 mmHg. Respiratory normal breathing without difficulty. clear to auscultation bilaterally. Cardiovascular regular rate and rhythm with normal S1, S2. Psychiatric this patient is able to make decisions and demonstrates good insight into disease process. Alert and Oriented x 3. pleasant and cooperative. General Notes: Currently there was no debridement necessary today which was good news. I do believe that she is likely going to fare well although I do think we may be able to do some things to help stimulate more rapid epithelialization at this point in time. Integumentary (Hair, Skin) Wound #1 status is Open. Original cause of wound was Thermal Burn. The wound is located on the Right Breast. The  wound measures 5cm length x 5cm width x 0.1cm depth; 19.635cm^2 area and 1.963cm^3 volume. There is Fat Layer (Subcutaneous Tissue) Exposed exposed. There is no tunneling or undermining noted. There is a medium amount of serosanguineous drainage noted. The wound margin is distinct with the outline attached to the wound base. There is medium (34-66%) red granulation within the wound bed. There  is a medium (34-66%) amount of necrotic tissue within the wound bed including Eschar and Adherent Slough. The periwound skin appearance exhibited: Dry/Scaly. The periwound skin appearance did not exhibit: Callus, Crepitus, Excoriation, Induration, Rash, Scarring, Maceration, Atrophie Blanche, Cyanosis, Ecchymosis, Hemosiderin Staining, Mottled, Pallor, Rubor, Erythema. Periwound temperature was noted as No Abnormality. Assessment Active Problems ICD-10 T31.0 - Burns involving less than 10% of body surface L98.492 - Non-pressure chronic ulcer of skin of other sites with fat layer exposed I10 - Essential (primary) hypertension Martinique, Eli T. ([id number]) Plan Wound Cleansing: Wound #1 Right Breast: Clean wound with Normal Saline. May Shower, gently pat wound dry prior to applying new dressing. Anesthetic (add to Medication List): Wound #1 Right Breast: Topical Lidocaine 4% cream applied to wound bed prior to debridement (In Clinic Only). Primary Wound Dressing: Wound #1 Right Breast: Silver Alginate Secondary Dressing: Wound #1 Right Breast: Boardered Foam Dressing Non-adherent pad Dressing Change Frequency: Wound #1 Right Breast: Change dressing every other day. Follow-up Appointments: Wound #1 Right Breast: Return Appointment in 1 week. Additional Orders / Instructions: Wound #1 Right Breast: Vitamin A; Vitamin C, Zinc - Please add a multivitamin with 100% of these vitamins and minerals Increase protein intake. At this point patient seems to be doing well in regard to her ulceration  although the epithelialization is there it has been somewhat slow in considering the length of time this has been present I do think that she may be a candidate for PuraPly Matrix or possibly even NuShield we will see about gaining approval for this Over patient is in agreement with plan. For the time being I'm gonna switch to a silver alginate dressing due to the the next week. We will also see how she is doing in follow- up. Maceration around the edges of the wound she is to monitor this to make sure that she does not become to dry and that the wound bed starts to dry out which we definitely do not want. Please see above for specific wound care orders. We will see patient for re-evaluation in 1 week(s) here in the clinic. If anything worsens or changes patient will contact our office for additional recommendations. Electronic Signature(s) Signed: 02/06/2018 11:36:05 PM By: Worthy Keeler PA-C Entered By: Worthy Keeler on 02/05/2018 19:54:44 Martinique, Jenetta Downer ([id number]) -------------------------------------------------------------------------------- ROS/PFSH Details Patient Name: Martinique, Amberli T. Date of Service: 02/04/2018 9:45 AM Medical Record Number: [id number] Patient Account Number: [id number] Date of Birth/Sex: 1946/05/18 (72 y.o. F) Treating RN: Montey Hora Primary Care Provider: Deborra Medina Other Clinician: Referring Provider: Deborra Medina Treating Provider/Extender: Melburn Hake, HOYT Weeks in Treatment: 1 Information Obtained From Patient Wound History Do you currently have one or more open woundso Yes How many open wounds do you currently haveo 1 Approximately how long have you had your woundso 5 weeks How have you been treating your wound(s) until nowo bactroban Has your wound(s) ever healed and then re-openedo No Have you had any lab work done in the past montho No Have you tested positive for an antibiotic resistant organism (MRSA, VRE)o No Have you tested  positive for osteomyelitis (bone infection)o No Have you had any tests for circulation on your legso No Constitutional Symptoms (General Health) Complaints and Symptoms: Negative for: Fever; Chills Eyes Medical History: Negative for: Cataracts; Glaucoma; Optic Neuritis Ear/Nose/Mouth/Throat Medical History: Negative for: Chronic sinus problems/congestion; Middle ear problems Hematologic/Lymphatic Medical History: Negative for: Anemia; Hemophilia; Human Immunodeficiency Virus; Lymphedema; Sickle Cell Disease Respiratory Complaints and Symptoms: No Complaints  or Symptoms Medical History: Negative for: Aspiration; Asthma; Pneumothorax; Sleep Apnea; Tuberculosis Past Medical History Notes: 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 ([id number]) Negative for: Angina; Deep Vein Thrombosis; Hypotension; Peripheral Arterial Disease; Peripheral Venous Disease; Phlebitis; Vasculitis Gastrointestinal Medical History: Negative for: Cirrhosis ; Colitis; Crohnos; Hepatitis A; Hepatitis B; Hepatitis C Immunological Medical History: Negative for: Lupus Erythematosus; Raynaudos; Scleroderma Integumentary (Skin) Medical History: Negative for: History of Burn; History of pressure wounds Musculoskeletal Medical History: Positive for: Osteoarthritis Negative for: Gout; Rheumatoid Arthritis; Osteomyelitis Neurologic Medical History: Negative for: Dementia; Neuropathy; Quadriplegia; Paraplegia; Seizure Disorder Oncologic Medical History: Positive for: Received Chemotherapy - 1991; Received Radiation - 1991 Psychiatric Complaints and Symptoms: No Complaints or Symptoms Immunizations Pneumococcal Vaccine: Received Pneumococcal Vaccination: Yes Implantable Devices Family and  Social History Cancer: Yes - Father; Diabetes: Yes - Siblings; Heart Disease: Yes - Mother,Father; Hypertension: Yes - Mother; Kidney Disease: Yes - Siblings; Lung Disease: Yes - Father; Seizures: No; Stroke: Yes - Father; Thyroid Problems: Yes - Siblings; Tuberculosis: No; Never smoker; Marital Status - Married; Alcohol Use: Rarely; Drug Use: No History; Caffeine Use: Daily; Financial Concerns: No; Food, Clothing or Shelter Needs: No; Support System Lacking: No; Transportation Concerns: No; Advanced Directives: Yes (Not Provided); Patient does not want information on Advanced Directives; Do not resuscitate: No; Living Will: Yes (Not Provided); Medical Power of Attorney: Yes (Not Provided) Physician Affirmation I have reviewed and agree with the above information. Electronic Signature(s) Signed: 02/04/2018 4:19:12 PM By: Dorthy, Joanna Martinique, Jenetta Downer ([id number]) Signed: 02/06/2018 11:36:05 PM By: Worthy Keeler PA-C Entered By: Worthy Keeler on 02/04/2018 13:21:46 Martinique, Jenetta Downer ([id number]) -------------------------------------------------------------------------------- SuperBill Details Patient Name: Martinique, Merrilyn T. Date of Service: 02/04/2018 Medical Record Number: [id number] Patient Account Number: [id number] Date of Birth/Sex: 12/09/1945 (72 y.o. F) Treating RN: Montey Hora Primary Care Provider: Deborra Medina Other Clinician: Referring Provider: Deborra Medina Treating Provider/Extender: Melburn Hake, HOYT Weeks in Treatment: 1 Diagnosis Coding ICD-10 Codes Code Description T31.0 Burns involving less than 10% of body surface L98.492 Non-pressure chronic ulcer of skin of other sites with fat layer exposed I10 Essential (primary) hypertension Facility Procedures CPT4 Code: [id number] Description: [phone] - WOUND CARE VISIT-LEV 2 EST PT Modifier: Quantity: 1 Physician Procedures CPT4 Code: [id number] Description: 97416 - WC PHYS LEVEL 3 - EST PT ICD-10 Diagnosis Description  T31.0 Burns involving less than 10% of body surface L98.492 Non-pressure chronic ulcer of skin of other sites with fat l I10 Essential (primary) hypertension Modifier: ayer exposed Quantity: 1 Electronic Signature(s) Signed: 02/06/2018 11:36:05 PM By: Worthy Keeler PA-C Entered By: Worthy Keeler on 02/05/2018 19:54:57

## 2018-02-08 NOTE — Progress Notes (Signed)
Stacie Mcdonald, Zaylie T. ([id number]) Visit Report for 02/04/2018 Arrival Information Details Patient Name: Stacie Mcdonald, Stacie T. Date of Service: 02/04/2018 9:45 AM Medical Record Number: [id number] Patient Account Number: [id number] Date of Birth/Sex: [birth year] (72 y.o. F) Treating RN: Roger Shelter Primary Care Aseret Hoffman: Deborra Medina Other Clinician: Referring Jaydis Duchene: Deborra Medina Treating Jezreel Justiniano/Extender: Melburn Hake, HOYT Weeks in Treatment: 1 Visit Information History Since Last Visit All ordered tests and consults were completed: No Patient Arrived: Ambulatory Added or deleted any medications: No Arrival Time: 09:49 Any new allergies or adverse reactions: No Accompanied By: husband Had a fall or experienced change in No Transfer Assistance: None activities of daily living that may affect Patient Identification Verified: Yes risk of falls: Secondary Verification Process Completed: Yes Signs or symptoms of abuse/neglect since last visito No Hospitalized since last visit: No Pain Present Now: No Electronic Signature(s) Signed: 02/04/2018 11:47:06 AM By: Roger Shelter Entered By: Roger Shelter on 02/04/2018 09:49:53 Stacie Mcdonald, Stacie Mcdonald ([id number]) -------------------------------------------------------------------------------- Clinic Level of Care Assessment Details Patient Name: Stacie Mcdonald, Stacie T. Date of Service: 02/04/2018 9:45 AM Medical Record Number: [id number] Patient Account Number: [id number] Date of Birth/Sex: 08/22/1946 (72 y.o. F) Treating RN: Montey Hora Primary Care Guerry Covington: Deborra Medina Other Clinician: Referring Arael Piccione: Deborra Medina Treating Quavis Klutz/Extender: Melburn Hake, HOYT Weeks in Treatment: 1 Clinic Level of Care Assessment Items TOOL 4 Quantity Score []  - Use when only an EandM is performed on FOLLOW-UP visit 0 ASSESSMENTS - Nursing Assessment / Reassessment X - Reassessment of Co-morbidities (includes updates in patient status) 1 10 X- 1  5 Reassessment of Adherence to Treatment Plan ASSESSMENTS - Wound and Skin Assessment / Reassessment X - Simple Wound Assessment / Reassessment - one wound 1 5 []  - 0 Complex Wound Assessment / Reassessment - multiple wounds []  - 0 Dermatologic / Skin Assessment (not related to wound area) ASSESSMENTS - Focused Assessment []  - Circumferential Edema Measurements - multi extremities 0 []  - 0 Nutritional Assessment / Counseling / Intervention []  - 0 Lower Extremity Assessment (monofilament, tuning fork, pulses) []  - 0 Peripheral Arterial Disease Assessment (using hand held doppler) ASSESSMENTS - Ostomy and/or Continence Assessment and Care []  - Incontinence Assessment and Management 0 []  - 0 Ostomy Care Assessment and Management (repouching, etc.) PROCESS - Coordination of Care X - Simple Patient / Family Education for ongoing care 1 15 []  - 0 Complex (extensive) Patient / Family Education for ongoing care []  - 0 Staff obtains Programmer, systems, Records, Test Results / Process Orders []  - 0 Staff telephones HHA, Nursing Homes / Clarify orders / etc []  - 0 Routine Transfer to another Facility (non-emergent condition) []  - 0 Routine Hospital Admission (non-emergent condition) []  - 0 New Admissions / Biomedical engineer / Ordering NPWT, Apligraf, etc. []  - 0 Emergency Hospital Admission (emergent condition) X- 1 10 Simple Discharge Coordination Stacie Mcdonald, Stacie T. ([id number]) []  - 0 Complex (extensive) Discharge Coordination PROCESS - Special Needs []  - Pediatric / Minor Patient Management 0 []  - 0 Isolation Patient Management []  - 0 Hearing / Language / Visual special needs []  - 0 Assessment of Community assistance (transportation, D/C planning, etc.) []  - 0 Additional assistance / Altered mentation []  - 0 Support Surface(s) Assessment (bed, cushion, seat, etc.) INTERVENTIONS - Wound Cleansing / Measurement X - Simple Wound Cleansing - one wound 1 5 []  - 0 Complex Wound  Cleansing - multiple wounds X- 1 5 Wound Imaging (photographs - any number of wounds) []  - 0 Wound Tracing (instead of photographs) X- 1 5 Simple  Wound Measurement - one wound []  - 0 Complex Wound Measurement - multiple wounds INTERVENTIONS - Wound Dressings X - Small Wound Dressing one or multiple wounds 1 10 []  - 0 Medium Wound Dressing one or multiple wounds []  - 0 Large Wound Dressing one or multiple wounds []  - 0 Application of Medications - topical []  - 0 Application of Medications - injection INTERVENTIONS - Miscellaneous []  - External ear exam 0 []  - 0 Specimen Collection (cultures, biopsies, blood, body fluids, etc.) []  - 0 Specimen(s) / Culture(s) sent or taken to Lab for analysis []  - 0 Patient Transfer (multiple staff / Civil Service fast streamer / Similar devices) []  - 0 Simple Staple / Suture removal (25 or less) []  - 0 Complex Staple / Suture removal (26 or more) []  - 0 Hypo / Hyperglycemic Management (close monitor of Blood Glucose) []  - 0 Ankle / Brachial Index (ABI) - do not check if billed separately X- 1 5 Vital Signs Stacie Mcdonald, Stacie T. ([id number]) Has the patient been seen at the hospital within the last three years: Yes Total Score: 75 Level Of Care: New/Established - Level 2 Electronic Signature(s) Signed: 02/04/2018 4:19:12 PM By: Montey Hora Entered By: Montey Hora on 02/04/2018 10:10:34 Stacie Mcdonald, Stacie T. ([id number]) -------------------------------------------------------------------------------- Encounter Discharge Information Details Patient Name: Stacie Mcdonald, Stacie T. Date of Service: 02/04/2018 9:45 AM Medical Record Number: [id number] Patient Account Number: [id number] Date of Birth/Sex: 1946-04-11 (72 y.o. F) Treating RN: Ahmed Prima Primary Care Samiyyah Moffa: Deborra Medina Other Clinician: Referring Kathy Wahid: Deborra Medina Treating Shontel Santee/Extender: Melburn Hake, HOYT Weeks in Treatment: 1 Encounter Discharge Information Items Discharge Pain Level:  0 Discharge Condition: Stable Ambulatory Status: Ambulatory Discharge Destination: Home Transportation: Private Auto Accompanied By: self Schedule Follow-up Appointment: Yes Medication Reconciliation completed and No provided to Patient/Care Corliss Lamartina: Provided on Clinical Summary of Care: 02/04/2018 Form Type Recipient Paper Patient SJ Electronic Signature(s) Signed: 02/07/2018 10:49:50 AM By: Ruthine Dose Entered By: Ruthine Dose on 02/04/2018 10:23:41 Stacie Mcdonald, Stacie Mcdonald ([id number]) -------------------------------------------------------------------------------- Lower Extremity Assessment Details Patient Name: Stacie Mcdonald, Stacie T. Date of Service: 02/04/2018 9:45 AM Medical Record Number: [id number] Patient Account Number: [id number] Date of Birth/Sex: [birth year] (72 y.o. F) Treating RN: Roger Shelter Primary Care Vinetta Brach: Deborra Medina Other Clinician: Referring Stefano Trulson: Deborra Medina Treating Kathi Dohn/Extender: Melburn Hake, HOYT Weeks in Treatment: 1 Electronic Signature(s) Signed: 02/04/2018 11:47:06 AM By: Roger Shelter Entered By: Roger Shelter on 02/04/2018 10:01:26 Stacie Mcdonald, Stacie Mcdonald ([id number]) -------------------------------------------------------------------------------- Multi Wound Chart Details Patient Name: Stacie Mcdonald, Stacie T. Date of Service: 02/04/2018 9:45 AM Medical Record Number: [id number] Patient Account Number: [id number] Date of Birth/Sex: Aug 03, 1946 (72 y.o. F) Treating RN: Montey Hora Primary Care Karyss Frese: Deborra Medina Other Clinician: Referring Ryan Ogborn: Deborra Medina Treating Berlie Persky/Extender: Melburn Hake, HOYT Weeks in Treatment: 1 Vital Signs Height(in): 64 Pulse(bpm): 91 Weight(lbs): 183 Blood Pressure(mmHg): 158/78 Body Mass Index(BMI): 31 Temperature(F): 98.3 Respiratory Rate 18 (breaths/min): Photos: [1:No Photos] [N/A:N/A] Wound Location: [1:Right Breast] [N/A:N/A] Wounding Event: [1:Thermal Burn] [N/A:N/A] Primary  Etiology: [1:3rd degree Burn] [N/A:N/A] Comorbid History: [1:Arrhythmia, Congestive Heart Failure, Coronary Artery Disease, Hypertension, Myocardial Infarction, Osteoarthritis, Received Chemotherapy, Received Radiation] [N/A:N/A] Date Acquired: [1:01/14/2018] [N/A:N/A] Weeks of Treatment: [1:1] [N/A:N/A] Wound Status: [1:Open] [N/A:N/A] Measurements L x W x D [1:5x5x0.1] [N/A:N/A] (cm) Area (cm) : [1:19.635] [N/A:N/A] Volume (cm) : [1:1.963] [N/A:N/A] % Reduction in Area: [1:13.50%] [N/A:N/A] % Reduction in Volume: [1:13.60%] [N/A:N/A] Classification: [1:Full Thickness Without Exposed Support Structures] [N/A:N/A] Exudate Amount: [1:Medium] [N/A:N/A] Exudate Type: [1:Serosanguineous] [N/A:N/A] Exudate Color: [1:red, brown] [N/A:N/A] Wound Margin: [1:Distinct, outline attached] [N/A:N/A] Granulation  Amount: [1:Medium (34-66%)] [N/A:N/A] Granulation Quality: [1:Red] [N/A:N/A] Necrotic Amount: [1:Medium (34-66%)] [N/A:N/A] Necrotic Tissue: [1:Eschar, Adherent Slough] [N/A:N/A] Exposed Structures: [1:Fat Layer (Subcutaneous Tissue) Exposed: Yes Fascia: No Tendon: No Muscle: No Joint: No Bone: No] [N/A:N/A] Epithelialization: None N/A N/A Periwound Skin Texture: Excoriation: No N/A N/A Induration: No Callus: No Crepitus: No Rash: No Scarring: No Periwound Skin Moisture: Dry/Scaly: Yes N/A N/A Maceration: No Periwound Skin Color: Atrophie Blanche: No N/A N/A Cyanosis: No Ecchymosis: No Erythema: No Hemosiderin Staining: No Mottled: No Pallor: No Rubor: No Temperature: No Abnormality N/A N/A Tenderness on Palpation: No N/A N/A Wound Preparation: Ulcer Cleansing: N/A N/A Rinsed/Irrigated with Saline Topical Anesthetic Applied: Other: lidocaine 4% Treatment Notes Electronic Signature(s) Signed: 02/04/2018 4:19:12 PM By: Montey Hora Entered By: Montey Hora on 02/04/2018 10:09:09 Stacie Mcdonald, Stacie T.  ([id number]) -------------------------------------------------------------------------------- Multi-Disciplinary Care Plan Details Patient Name: Stacie Mcdonald, Evamaria T. Date of Service: 02/04/2018 9:45 AM Medical Record Number: [id number] Patient Account Number: [id number] Date of Birth/Sex: 12-11-1945 (72 y.o. F) Treating RN: Montey Hora Primary Care Janaia Kozel: Deborra Medina Other Clinician: Referring Latrisa Hellums: Deborra Medina Treating Jaxon Flatt/Extender: Melburn Hake, HOYT Weeks in Treatment: 1 Active Inactive ` Orientation to the Wound Care Program Nursing Diagnoses: Knowledge deficit related to the wound healing center program Goals: Patient/caregiver will verbalize understanding of the [facility] Program Date Initiated: 01/28/2018 Target Resolution Date: 04/23/2018 Goal Status: Active Interventions: Provide education on orientation to the wound center Notes: ` Wound/Skin Impairment Nursing Diagnoses: Impaired tissue integrity Goals: Ulcer/skin breakdown will heal within 14 weeks Date Initiated: 01/28/2018 Target Resolution Date: 04/23/2018 Goal Status: Active Interventions: Assess patient/caregiver ability to obtain necessary supplies Assess patient/caregiver ability to perform ulcer/skin care regimen upon admission and as needed Assess ulceration(s) every visit Notes: Electronic Signature(s) Signed: 02/04/2018 4:19:12 PM By: Montey Hora Entered By: Montey Hora on 02/04/2018 10:08:35 Stacie Mcdonald, Stacie T. ([id number]) -------------------------------------------------------------------------------- Pain Assessment Details Patient Name: Stacie Mcdonald, Stacie T. Date of Service: 02/04/2018 9:45 AM Medical Record Number: [id number] Patient Account Number: [id number] Date of Birth/Sex: September 10, 1946 (72 y.o. F) Treating RN: Roger Shelter Primary Care Diamon Reddinger: Deborra Medina Other Clinician: Referring Maximino Cozzolino: Deborra Medina Treating Shaolin Armas/Extender: Melburn Hake, HOYT Weeks in  Treatment: 1 Active Problems Location of Pain Severity and Description of Pain Patient Has Paino No Site Locations Pain Management and Medication Current Pain Management: Electronic Signature(s) Signed: 02/04/2018 11:47:06 AM By: Roger Shelter Entered By: Roger Shelter on 02/04/2018 09:50:00 Stacie Mcdonald, Stacie Mcdonald ([id number]) -------------------------------------------------------------------------------- Patient/Caregiver Education Details Patient Name: Stacie Mcdonald, Stacie T. Date of Service: 02/04/2018 9:45 AM Medical Record Number: [id number] Patient Account Number: [id number] Date of Birth/Gender: [birth year] (72 y.o. F) Treating RN: Ahmed Prima Primary Care Physician: Deborra Medina Other Clinician: Referring Physician: Deborra Medina Treating Physician/Extender: Melburn Hake, HOYT Weeks in Treatment: 1 Education Assessment Education Provided To: Patient Education Topics Provided Wound/Skin Impairment: Handouts: Caring for Your Ulcer, Other: change dressing as ordered Methods: Demonstration, Explain/Verbal Responses: State content correctly Electronic Signature(s) Signed: 02/07/2018 4:28:49 PM By: Alric Quan Entered By: Alric Quan on 02/04/2018 10:14:29 Stacie Mcdonald, Winta Darene Lamer ([id number]) -------------------------------------------------------------------------------- Wound Assessment Details Patient Name: Stacie Mcdonald, Achol T. Date of Service: 02/04/2018 9:45 AM Medical Record Number: [id number] Patient Account Number: [id number] Date of Birth/Sex: [birth year] (72 y.o. F) Treating RN: Roger Shelter Primary Care Leona Pressly: Deborra Medina Other Clinician: Referring Oksana Deberry: Deborra Medina Treating Dreux Mcgroarty/Extender: Melburn Hake, HOYT Weeks in Treatment: 1 Wound Status Wound Number: 1 Primary 3rd degree Burn Etiology: Wound Location: Right Breast Wound Open Wounding Event: Thermal Burn Status: Date Acquired: 01/14/2018 Comorbid Arrhythmia, Congestive Heart  Failure,  Coronary Weeks Of Treatment: 1 History: Artery Disease, Hypertension, Myocardial Clustered Wound: No Infarction, Osteoarthritis, Received Chemotherapy, Received Radiation Photos Photo Uploaded By: Roger Shelter on 02/04/2018 11:43:24 Wound Measurements Length: (cm) 5 Width: (cm) 5 Depth: (cm) 0.1 Area: (cm) 19.635 Volume: (cm) 1.963 % Reduction in Area: 13.5% % Reduction in Volume: 13.6% Epithelialization: None Tunneling: No Undermining: No Wound Description Full Thickness Without Exposed Support Classification: Structures Wound Margin: Distinct, outline attached Exudate Medium Amount: Exudate Type: Serosanguineous Exudate Color: red, brown Foul Odor After Cleansing: No Slough/Fibrino Yes Wound Bed Granulation Amount: Medium (34-66%) Exposed Structure Granulation Quality: Red Fascia Exposed: No Necrotic Amount: Medium (34-66%) Fat Layer (Subcutaneous Tissue) Exposed: Yes Necrotic Quality: Eschar, Adherent Slough Tendon Exposed: No Muscle Exposed: No Joint Exposed: No Stacie Mcdonald, Jalena T. ([id number]) Bone Exposed: No Periwound Skin Texture Texture Color No Abnormalities Noted: No No Abnormalities Noted: No Callus: No Atrophie Blanche: No Crepitus: No Cyanosis: No Excoriation: No Ecchymosis: No Induration: No Erythema: No Rash: No Hemosiderin Staining: No Scarring: No Mottled: No Pallor: No Moisture Rubor: No No Abnormalities Noted: No Dry / Scaly: Yes Temperature / Pain Maceration: No Temperature: No Abnormality Wound Preparation Ulcer Cleansing: Rinsed/Irrigated with Saline Topical Anesthetic Applied: Other: lidocaine 4%, Treatment Notes Wound #1 (Right Breast) 1. Cleansed with: Clean wound with Normal Saline 2. Anesthetic Topical Lidocaine 4% cream to wound bed prior to debridement 4. Dressing Applied: Other dressing (specify in notes) 5. Secondary Dressing Applied Bordered Foam Dressing Electronic Signature(s) Signed: 02/04/2018  11:47:06 AM By: Roger Shelter Entered By: Roger Shelter on 02/04/2018 09:58:55 Stacie Mcdonald, Stacie Mcdonald ([id number]) -------------------------------------------------------------------------------- [place] Details Patient Name: Stacie Mcdonald, Micaila T. Date of Service: 02/04/2018 9:45 AM Medical Record Number: [id number] Patient Account Number: [id number] Date of Birth/Sex: 1946-06-05 (72 y.o. F) Treating RN: Roger Shelter Primary Care Damyah Gugel: Deborra Medina Other Clinician: Referring Faith Patricelli: Deborra Medina Treating Breton Berns/Extender: Melburn Hake, HOYT Weeks in Treatment: 1 Vital Signs Time Taken: 09:50 Temperature (F): 98.3 Height (in): 64 Pulse (bpm): 91 Weight (lbs): 183 Respiratory Rate (breaths/min): 18 Body Mass Index (BMI): 31.4 Blood Pressure (mmHg): 158/78 Reference Range: 80 - 120 mg / dl Electronic Signature(s) Signed: 02/04/2018 11:47:06 AM By: Roger Shelter Entered By: Roger Shelter on 02/04/2018 09:50:29

## 2018-02-11 ENCOUNTER — Encounter: Payer: Medicare Other | Admitting: Physician Assistant

## 2018-02-11 DIAGNOSIS — I2510 Atherosclerotic heart disease of native coronary artery without angina pectoris: Secondary | ICD-10-CM | POA: Diagnosis not present

## 2018-02-11 DIAGNOSIS — I509 Heart failure, unspecified: Secondary | ICD-10-CM | POA: Diagnosis not present

## 2018-02-11 DIAGNOSIS — Z853 Personal history of malignant neoplasm of breast: Secondary | ICD-10-CM | POA: Diagnosis not present

## 2018-02-11 DIAGNOSIS — E1151 Type 2 diabetes mellitus with diabetic peripheral angiopathy without gangrene: Secondary | ICD-10-CM | POA: Diagnosis not present

## 2018-02-11 DIAGNOSIS — I252 Old myocardial infarction: Secondary | ICD-10-CM | POA: Diagnosis not present

## 2018-02-11 DIAGNOSIS — I110 Hypertensive heart disease with heart failure: Secondary | ICD-10-CM | POA: Diagnosis not present

## 2018-02-11 DIAGNOSIS — L98492 Non-pressure chronic ulcer of skin of other sites with fat layer exposed: Secondary | ICD-10-CM | POA: Diagnosis not present

## 2018-02-15 ENCOUNTER — Telehealth: Payer: Self-pay

## 2018-02-15 NOTE — Telephone Encounter (Signed)
Please advise 

## 2018-02-15 NOTE — Telephone Encounter (Signed)
LMTCB. PEC may speak with pt.  

## 2018-02-15 NOTE — Progress Notes (Signed)
Stacie Mcdonald, Lian T. ([id number]) Visit Report for 02/11/2018 Chief Complaint Document Details Patient Name: Stacie Mcdonald, Stacie T. Date of Service: 02/11/2018 10:30 AM Medical Record Number: [id number] Patient Account Number: [id number] Date of Birth/Sex: [birth year] (72 y.o. F) Treating RN: Montey Hora Primary Care Provider: Deborra Medina Other Clinician: Referring Provider: Deborra Medina Treating Provider/Extender: Melburn Hake, HOYT Weeks in Treatment: 2 Information Obtained from: Patient Chief Complaint Left breast burn Electronic Signature(s) Signed: 02/11/2018 6:18:22 PM By: Worthy Keeler PA-C Entered By: Worthy Keeler on 02/11/2018 10:58:56 Stacie Mcdonald, Stacie Mcdonald ([id number]) -------------------------------------------------------------------------------- Cellular or Tissue Based Product Details Patient Name: Stacie Mcdonald, Stacie T. Date of Service: 02/11/2018 10:30 AM Medical Record Number: [id number] Patient Account Number: [id number] Date of Birth/Sex: 1946/01/11 (72 y.o. F) Treating RN: Montey Hora Primary Care Provider: Deborra Medina Other Clinician: Referring Provider: Deborra Medina Treating Provider/Extender: Melburn Hake, HOYT Weeks in Treatment: 2 Cellular or Tissue Based Wound #1 Right Breast Product Type Applied to: Performed By: Physician STONE III, HOYT E., PA-C Cellular or Tissue Based Other Product Type: Pre-procedure Verification/Time Yes - 10:49 Out Taken: Location: trunk / arms / legs Wound Size (sq cm): 18 Product Size (sq cm): 12 Waste Size (sq cm): 0 Amount of Product Applied (sq cm): 12 Instrument Used: Forceps Lot #: [id number] Expiration Date: 01/24/2023 Fenestrated: No Reconstituted: Yes Solution Type: normal saline Solution Amount: 20ml Lot #: [id number] Solution Expiration Date: 10/17/2019 Secured: Yes Secured With: Steri-Strips Dressing Applied: Yes Primary Dressing: mepitel one Procedural Pain: 0 Post Procedural Pain: 0 Response to Treatment:  Procedure was tolerated well Post Procedure Diagnosis Same as Pre-procedure Electronic Signature(s) Signed: 02/11/2018 4:53:17 PM By: Montey Hora Entered By: Montey Hora on 02/11/2018 10:53:39 Stacie Mcdonald, Stacie Mcdonald ([id number]) -------------------------------------------------------------------------------- Debridement Details Patient Name: Stacie Mcdonald, Stacie T. Date of Service: 02/11/2018 10:30 AM Medical Record Number: [id number] Patient Account Number: [id number] Date of Birth/Sex: Jun 11, 1946 (72 y.o. F) Treating RN: Montey Hora Primary Care Provider: Deborra Medina Other Clinician: Referring Provider: Deborra Medina Treating Provider/Extender: Melburn Hake, HOYT Weeks in Treatment: 2 Debridement Performed for Wound #1 Right Breast Assessment: Performed By: Physician STONE III, HOYT E., PA-C Debridement Type: Debridement Pre-procedure Verification/Time Yes - 10:44 Out Taken: Start Time: 10:44 Total Area Debrided (L x W): 4 (cm) x 4.5 (cm) = 18 (cm) Tissue and other material Viable, Non-Viable, Subcutaneous, Fibrin/Exudate debrided: Level: Skin/Subcutaneous Tissue Debridement Description: Excisional Instrument: Curette Bleeding: Minimum Hemostasis Achieved: Pressure End Time: 10:47 Procedural Pain: 0 Post Procedural Pain: 0 Response to Treatment: Procedure was tolerated well Post Debridement Measurements of Total Wound Length: (cm) 4 Width: (cm) 4.5 Depth: (cm) 0.2 Volume: (cm) 2.827 Character of Wound/Ulcer Post Debridement: Improved Post Procedure Diagnosis Same as Pre-procedure Electronic Signature(s) Signed: 02/11/2018 4:53:17 PM By: Montey Hora Signed: 02/11/2018 6:18:22 PM By: Worthy Keeler PA-C Entered By: Montey Hora on 02/11/2018 10:48:03 Stacie Mcdonald, Stacie Mcdonald ([id number]) -------------------------------------------------------------------------------- HPI Details Patient Name: Stacie Mcdonald, Stacie T. Date of Service: 02/11/2018 10:30 AM Medical Record Number:  [id number] Patient Account Number: [id number] Date of Birth/Sex: Apr 03, 1946 (72 y.o. F) Treating RN: Montey Hora Primary Care Provider: Deborra Medina Other Clinician: Referring Provider: Deborra Medina Treating Provider/Extender: Melburn Hake, HOYT Weeks in Treatment: 2 History of Present Illness HPI Description: 01/28/18 on evaluation today patient presents for initial evaluation concerning a burn to the right breast which occurred roughly 5 weeks ago according to the patient when she fell asleep due to her back pain with a heating pad on her back. Due to the lack of sensation in her breast she  subsequently during the night rolled over onto the breast and this caused the burn that she is now been dealing with in the meantime. Unfortunately she still continue to have the back pain as well. According to her records it is stated that she has type II diabetes mellitus which is diet controlled and she's not checking her blood sugars at this point her most recent hemoglobin A1c with 6.5. On evaluation today she does have a eschar covering the majority the surface of the wound at this point and she does not seem to have any significant discomfort although she does have some pain. Patient also does have high blood pressure which is generally fairly well controlled. She was placed on doxycycline. However at this point she has completed that course of therapy. She does have a history of right breast mastectomy secondary to breast cancer which she had in 1991. She separately had a reconstruction surgery in 1993. She was also given Bactroban ointment by Dr. Derrel Nip who has been managing the burn up to this point. This was in conjunction with the doxycycline. Again fortunately patient does not seem to have significant pain at this point. 02/04/18 on evaluation today patient appears to be doing rather well in regard to her right lateral breast ulcer. This is secondary to a burn which was thermal in nature. With  that being said she seems to have a fairly good granular bed at this point does have some slight epithelialization around the edges of the wound although this is slowly healing it does appear to be some maceration noted as well. She does not seem to have any evidence of infection which is good news. 02/11/18 on evaluation today patient presents for follow-up concerning her right breast ulcer. She has been tolerating the dressing changes without complication. With that being said the wound bed does appear to be doing somewhat better and she does have a skin island and around the 9 o'clock location in regard to the wound. With that being said we have gotten her approved for NuShield. I explained how this could be beneficial for wound healing. She stated that after hearing the information about this she would like to proceed with application to see if this will aid in healing. Fortunately she has no evidence of infection. This is NuShield application #1. Electronic Signature(s) Signed: 02/11/2018 6:18:22 PM By: Worthy Keeler PA-C Entered By: Worthy Keeler on 02/11/2018 18:05:15 Stacie Mcdonald, Stacie Mcdonald ([id number]) -------------------------------------------------------------------------------- Physical Exam Details Patient Name: Stacie Mcdonald, Stacie T. Date of Service: 02/11/2018 10:30 AM Medical Record Number: [id number] Patient Account Number: [id number] Date of Birth/Sex: [birth year] (72 y.o. F) Treating RN: Montey Hora Primary Care Provider: Deborra Medina Other Clinician: Referring Provider: Deborra Medina Treating Provider/Extender: Melburn Hake, HOYT Weeks in Treatment: 2 Constitutional Well-nourished and well-hydrated in no acute distress. Respiratory normal breathing without difficulty. Psychiatric this patient is able to make decisions and demonstrates good insight into disease process. Alert and Oriented x 3. pleasant and cooperative. Notes Parts application of the NuShield I did sharply  debride the wound clean away any biofilm or slough there was minimal noted but she tolerated this without any pain. Post debridement application of the NuShield was performed and I did choose to do the upper portion where she had the skin bridge and above covering the majority of the wound surface. The lower portion was not covered at this point although if we can get did epithelialization on the upper then we should be able to then utilize the  NuShield over the lower portion going forward. Nonetheless she tolerated this without any complication or concern. Electronic Signature(s) Signed: 02/11/2018 6:18:22 PM By: Worthy Keeler PA-C Entered By: Worthy Keeler on 02/11/2018 18:06:16 Stacie Mcdonald, Stacie Mcdonald ([id number]) -------------------------------------------------------------------------------- Physician Orders Details Patient Name: Stacie Mcdonald, [place] T. Date of Service: 02/11/2018 10:30 AM Medical Record Number: [id number] Patient Account Number: [id number] Date of Birth/Sex: 02/18/1946 (72 y.o. F) Treating RN: Montey Hora Primary Care Provider: Deborra Medina Other Clinician: Referring Provider: Deborra Medina Treating Provider/Extender: Melburn Hake, HOYT Weeks in Treatment: 2 Verbal / Phone Orders: No Diagnosis Coding Wound Cleansing Wound #1 Right Breast o Clean wound with Normal Saline. o May Shower, gently pat wound dry prior to applying new dressing. Anesthetic (add to Medication List) Wound #1 Right Breast o Topical Lidocaine 4% cream applied to wound bed prior to debridement (In Clinic Only). Skin Barriers/Peri-Wound Care Wound #1 Right Breast o Skin Prep Primary Wound Dressing Wound #1 Right Breast o Mepitel One Contact layer o Drawtex o Other: - Nusheild applied today Secondary Dressing Wound #1 Right Breast o Boardered Foam Dressing Dressing Change Frequency Wound #1 Right Breast o Change dressing every week - may change outter bandage as needed for  drainage Follow-up Appointments Wound #1 Right Breast o Return Appointment in 1 week. Additional Orders / Instructions Wound #1 Right Breast o Vitamin A; Vitamin C, Zinc - Please add a multivitamin with 100% of these vitamins and minerals o Increase protein intake. Notes Nusheild applied in clinic; including contact layer, fixation with steri strips, drawtex and cover bandage Electronic Signature(s) Stacie Mcdonald, Stacie T. ([id number]) Signed: 02/11/2018 4:53:17 PM By: Montey Hora Signed: 02/11/2018 6:18:22 PM By: Worthy Keeler PA-C Entered By: Montey Hora on 02/11/2018 10:56:54 Stacie Mcdonald, Stacie Mcdonald ([id number]) -------------------------------------------------------------------------------- Problem List Details Patient Name: Stacie Mcdonald, Stacie T. Date of Service: 02/11/2018 10:30 AM Medical Record Number: [id number] Patient Account Number: [id number] Date of Birth/Sex: 06-Dec-1945 (72 y.o. 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 Date of Service: 02/11/2018 10:30 AM Medical Record Number: [id number] Patient Account Number: [id number] Date of Birth/Sex: December 11, 1945 (72 y.o. F) Treating RN: Montey Hora Primary  Care Provider: Deborra Medina Other Clinician: Referring Provider: Deborra Medina Treating Provider/Extender: Melburn Hake, HOYT Weeks in Treatment: 2 Subjective Chief Complaint Information obtained from Patient Left breast burn History of Present Illness (HPI) 01/28/18 on evaluation today patient presents for initial evaluation concerning a burn to the right breast which occurred roughly 5 weeks ago according to the patient when she fell asleep due to her back pain with a heating pad on her back. Due to the lack of sensation in her breast she subsequently during the night rolled over onto the breast and this caused the burn that she is now been dealing with in the meantime. Unfortunately she still continue to have the back pain as well. According to her records it is stated that she has type II diabetes mellitus which is diet  controlled and she's not checking her blood sugars at this point her most recent hemoglobin A1c with 6.5. On evaluation today she does have a eschar covering the majority the surface of the wound at this point and she does not seem to have any significant discomfort although she does have some pain. Patient also does have high blood pressure which is generally fairly well controlled. She was placed on doxycycline. However at this point she has completed that course of therapy. She does have a history of right breast mastectomy secondary to breast cancer which she had in 1991. She separately had a reconstruction surgery in 1993. She was also given Bactroban ointment by Dr. Derrel Nip who has been managing the burn up to this point. This was in conjunction with the doxycycline. Again fortunately patient does not seem to have significant pain at this point. 02/04/18 on evaluation today patient appears to be doing rather well in regard to her right lateral breast ulcer. This is secondary to a burn which was thermal in nature. With that being said she seems to have a fairly good granular  bed at this point does have some slight epithelialization around the edges of the wound although this is slowly healing it does appear to be some maceration noted as well. She does not seem to have any evidence of infection which is good news. 02/11/18 on evaluation today patient presents for follow-up concerning her right breast ulcer. She has been tolerating the dressing changes without complication. With that being said the wound bed does appear to be doing somewhat better and she does have a skin island and around the 9 o'clock location in regard to the wound. With that being said we have gotten her approved for NuShield. I explained how this could be beneficial for wound healing. She stated that after hearing the information about this she would like to proceed with application to see if this will aid in healing. Fortunately she has no evidence of infection. This is NuShield application #1. Patient History Information obtained from Patient. Family History Cancer - Father, Diabetes - Siblings, Heart Disease - Mother,Father, Hypertension - Mother, Kidney Disease - Siblings, Lung Disease - Father, Stroke - Father, Thyroid Problems - Siblings, No family history of Seizures, Tuberculosis. Social History Never smoker, Marital Status - Married, Alcohol Use - Rarely, Drug Use - No History, Caffeine Use - Daily. 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 ([id number]) Review of Systems (ROS) Constitutional Symptoms (General Health) Denies complaints or symptoms of Fever, Chills. Respiratory The patient has no complaints or symptoms. Cardiovascular The patient has no complaints or symptoms. Psychiatric The patient has no complaints or symptoms. Objective Constitutional Well-nourished and well-hydrated in no acute distress. Vitals Time Taken: 10:24 AM, Height: 64 in, Weight: 183 lbs, BMI: 31.4, Temperature: 98.4 F, Pulse: 95  bpm, Respiratory Rate: 18 breaths/min, Blood Pressure: 150/72 mmHg. Respiratory normal breathing without difficulty. Psychiatric this patient is able to make decisions and demonstrates good insight into disease process. Alert and Oriented x 3. pleasant and cooperative. General Notes: Parts application of the NuShield I did sharply debride the wound clean away any biofilm or slough there was minimal noted but she tolerated this without any pain. Post debridement application of the NuShield was performed and I did choose to do the upper portion where she had the skin bridge and above covering the majority of the wound surface. The lower portion was not covered at this point although if  we can get did epithelialization on the upper then we should be able to then utilize the NuShield over the lower portion going forward. Nonetheless she tolerated this without any complication or concern. Integumentary (Hair, Skin) Wound #1 status is Open. Original cause of wound was Thermal Burn. The wound is located on the Right Breast. The wound measures 4cm length x 4.5cm width x 0.1cm depth; 14.137cm^2 area and 1.414cm^3 volume. There is Fat Layer (Subcutaneous Tissue) Exposed exposed. There is no tunneling or undermining noted. There is a medium amount of serosanguineous drainage noted. The wound margin is distinct with the outline attached to the wound base. There is medium (34-66%) red granulation within the wound bed. There is a medium (34-66%) amount of necrotic tissue within the wound bed including Eschar and Adherent Slough. The periwound skin appearance exhibited: Dry/Scaly. The periwound skin appearance did not exhibit: Callus, Crepitus, Excoriation, Induration, Rash, Scarring, Maceration, Atrophie Blanche, Cyanosis, Ecchymosis, Hemosiderin Staining, Mottled, Pallor, Rubor, Erythema. Periwound temperature was noted as No Abnormality. Assessment Stacie Mcdonald, Stacie T. 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 There was a Excisional Skin/Subcutaneous Tissue Debridement with a total area of 18 sq cm performed by STONE III, HOYT E., PA-C. With the following instrument(s): Curette. to remove Viable and Non-Viable tissue/material Material removed includes Subcutaneous Tissue and Fibrin/Exudate and. No specimens were taken. A time out was conducted at 10:44, prior to the start of the procedure. A Minimum amount of bleeding was controlled with Pressure. The procedure was tolerated well with a pain level of 0 throughout and a pain level of 0 following the procedure. Post Debridement Measurements: 4cm length x 4.5cm width x 0.2cm depth; 2.827cm^3 volume. Character of Wound/Ulcer Post Debridement is improved. Post procedure Diagnosis Wound #1: Same as Pre-Procedure Pre-procedure diagnosis of Wound #1 is a 3rd degree Burn located on the Right Breast. A skin graft procedure using a bioengineered skin substitute/cellular or tissue based product was performed by STONE III, HOYT E., PA-C with the following instrument(s): Forceps. Other was applied and secured with Steri-Strips. 12 sq cm of product was utilized and 0 sq cm was wasted. Post Application, mepitel one was applied. A Time Out was conducted at 10:49, prior to the start of the procedure. The procedure was tolerated well with a pain level of 0 throughout and a pain level of 0 following the procedure. Post procedure Diagnosis Wound #1: Same as Pre-Procedure . Plan Wound Cleansing: Wound #1 Right Breast: Clean wound with Normal Saline. May Shower, gently pat wound dry prior to applying new dressing. Anesthetic (add to Medication List): Wound #1 Right  Breast: Topical Lidocaine 4% cream applied to wound bed prior to debridement (In Clinic Only). Skin Barriers/Peri-Wound Care: Wound #1 Right Breast: Skin Prep Primary Wound Dressing: Wound #1 Right Breast: Mepitel One Contact layer Drawtex Other: - Nusheild applied today Secondary Dressing: Wound #1 Right Breast: Boardered Foam Dressing Stacie Mcdonald, Stacie T. ([id number]) Dressing Change Frequency: Wound #1 Right Breast: Change dressing every week - may change outter bandage as needed for drainage Follow-up Appointments: Wound #1 Right Breast: Return Appointment in 1 week. Additional Orders / Instructions: Wound #1 Right Breast: Vitamin A; Vitamin C, Zinc - Please add a multivitamin with 100% of these vitamins and  minerals Increase protein intake. General Notes: Nusheild applied in clinic; including contact layer, fixation with steri strips, drawtex and cover bandage I am going to recommend at this point that we go ahead and continue otherwise with good wound care she knows not to remove the entirety of this dressing although with drainage she can remove the outer portion the dressing if need be. Patient understands. We will otherwise see her for reevaluation in one weeks time to see were things stand and if need be will have the second application available and ready for her at that point. Please see above for specific wound care orders. We will see patient for re-evaluation in 1 week(s) here in the clinic. If anything worsens or changes patient will contact our office for additional recommendations. Electronic Signature(s) Signed: 02/11/2018 6:18:22 PM By: Worthy Keeler PA-C Entered By: Worthy Keeler on 02/11/2018 18:06:53 Stacie Mcdonald, Stacie Mcdonald ([id number]) -------------------------------------------------------------------------------- ROS/PFSH Details Patient Name: Stacie Mcdonald, Stacie T. Date of Service: 02/11/2018 10:30 AM Medical Record Number: [id number] Patient Account Number:  [id number] Date of Birth/Sex: [birth year] (72 y.o. F) Treating RN: Montey Hora Primary Care Provider: Deborra Medina Other Clinician: Referring Provider: Deborra Medina Treating Provider/Extender: Melburn Hake, HOYT Weeks in Treatment: 2 Information Obtained From Patient Wound History Do you currently have one or more open woundso Yes How many open wounds do you currently haveo 1 Approximately how long have you had your woundso 5 weeks How have you been treating your wound(s) until nowo bactroban Has your wound(s) ever healed and then re-openedo No Have you had any lab work done in the past montho No Have you tested positive for an antibiotic resistant organism (MRSA, VRE)o No Have you tested positive for osteomyelitis (bone infection)o No Have you had any tests for circulation on your legso No Constitutional Symptoms (General Health) Complaints and Symptoms: Negative for: Fever; Chills Eyes Medical History: Negative for: Cataracts; Glaucoma; Optic Neuritis Ear/Nose/Mouth/Throat Medical History: Negative for: Chronic sinus problems/congestion; Middle ear problems Hematologic/Lymphatic Medical History: Negative for: Anemia; Hemophilia; Human Immunodeficiency Virus; Lymphedema; Sickle Cell Disease Respiratory Complaints and Symptoms: No Complaints or Symptoms Medical History: Negative for: Aspiration; Asthma; Pneumothorax; Sleep Apnea; Tuberculosis Past Medical History Notes: 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 ([id number]) Negative for: Angina; Deep Vein Thrombosis; Hypotension; Peripheral Arterial Disease; Peripheral Venous Disease; Phlebitis; Vasculitis Gastrointestinal Medical History: Negative for: Cirrhosis ; Colitis; Crohnos; Hepatitis A; Hepatitis B;  Hepatitis C Immunological Medical History: Negative for: Lupus Erythematosus; Raynaudos; Scleroderma Integumentary (Skin) Medical History: Negative for: History of Burn; History of pressure wounds Musculoskeletal Medical History: Positive for: Osteoarthritis Negative for: Gout; Rheumatoid Arthritis; Osteomyelitis Neurologic Medical History: Negative for: Dementia; Neuropathy; Quadriplegia; Paraplegia; Seizure Disorder Oncologic Medical History: Positive for: Received Chemotherapy - 1991; Received Radiation - 1991 Psychiatric Complaints and Symptoms: No Complaints or Symptoms Immunizations Pneumococcal Vaccine: Received Pneumococcal Vaccination: Yes Implantable Devices Family and Social History Cancer: Yes - Father; Diabetes: Yes - Siblings; Heart Disease: Yes -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

## 2018-02-15 NOTE — Telephone Encounter (Signed)
Copied from [place] [phone]. Topic: Appointment Scheduling - Scheduling Inquiry for Clinic >> Feb 15, 2018  9:29 AM Conception Chancy, NT wrote: Patient is calling and states her husband is looking for a primary care doctor and would like to know if Dr. Derrel Nip will except him as a new patient. One Note states by MD request. Please advise.   Stacie Mcdonald [birth year]

## 2018-02-15 NOTE — Telephone Encounter (Signed)
I would love ot,  But I am overbooked currently and cannot accept any more patients at this time

## 2018-02-18 ENCOUNTER — Encounter: Payer: Medicare Other | Attending: Physician Assistant | Admitting: Physician Assistant

## 2018-02-18 DIAGNOSIS — T310 Burns involving less than 10% of body surface: Secondary | ICD-10-CM | POA: Diagnosis not present

## 2018-02-18 DIAGNOSIS — Z823 Family history of stroke: Secondary | ICD-10-CM | POA: Diagnosis not present

## 2018-02-18 DIAGNOSIS — X158XXA Contact with other hot household appliances, initial encounter: Secondary | ICD-10-CM | POA: Diagnosis not present

## 2018-02-18 DIAGNOSIS — Z9011 Acquired absence of right breast and nipple: Secondary | ICD-10-CM | POA: Diagnosis not present

## 2018-02-18 DIAGNOSIS — Y9389 Activity, other specified: Secondary | ICD-10-CM | POA: Diagnosis not present

## 2018-02-18 DIAGNOSIS — T2131XA Burn of third degree of chest wall, initial encounter: Secondary | ICD-10-CM | POA: Insufficient documentation

## 2018-02-18 DIAGNOSIS — M1990 Unspecified osteoarthritis, unspecified site: Secondary | ICD-10-CM | POA: Diagnosis not present

## 2018-02-18 DIAGNOSIS — E1151 Type 2 diabetes mellitus with diabetic peripheral angiopathy without gangrene: Secondary | ICD-10-CM | POA: Insufficient documentation

## 2018-02-18 DIAGNOSIS — I2510 Atherosclerotic heart disease of native coronary artery without angina pectoris: Secondary | ICD-10-CM | POA: Insufficient documentation

## 2018-02-18 DIAGNOSIS — I252 Old myocardial infarction: Secondary | ICD-10-CM | POA: Insufficient documentation

## 2018-02-18 DIAGNOSIS — Z853 Personal history of malignant neoplasm of breast: Secondary | ICD-10-CM | POA: Insufficient documentation

## 2018-02-18 DIAGNOSIS — I110 Hypertensive heart disease with heart failure: Secondary | ICD-10-CM | POA: Insufficient documentation
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
## 2018-02-20 NOTE — Progress Notes (Signed)
Stacie Mcdonald, Kortni T. ([id number]) Visit Report for 02/18/2018 Chief Complaint Document Details Patient Name: Stacie Mcdonald, Stacie T. Date of Service: 02/18/2018 9:15 AM Medical Record Number: [id number] Patient Account Number: [id number] Date of Birth/Sex: 27-Sep-1946 (72 y.o. F) Treating RN: Montey Hora Primary Care Provider: Deborra Medina Other Clinician: Referring Provider: Deborra Medina Treating Provider/Extender: Melburn Hake, HOYT Weeks in Treatment: 3 Information Obtained from: Patient Chief Complaint Left breast burn Electronic Signature(s) Signed: 02/18/2018 5:57:55 PM By: Worthy Keeler PA-C Entered By: Worthy Keeler on 02/18/2018 09:18:23 Stacie Mcdonald, Stacie Mcdonald ([id number]) -------------------------------------------------------------------------------- Cellular or Tissue Based Product Details Patient Name: Stacie Mcdonald, Stacie T. Date of Service: 02/18/2018 9:15 AM Medical Record Number: [id number] Patient Account Number: [id number] Date of Birth/Sex: 1945/12/09 (72 y.o. F) Treating RN: Montey Hora Primary Care Provider: Deborra Medina Other Clinician: Referring Provider: Deborra Medina Treating Provider/Extender: Melburn Hake, HOYT Weeks in Treatment: 3 Cellular or Tissue Based Wound #1 Right Breast Product Type Applied to: Performed By: Physician STONE III, HOYT E., PA-C Cellular or Tissue Based Other Product Type: Pre-procedure Verification/Time Yes - 09:39 Out Taken: Location: trunk / arms / legs Wound Size (sq cm): 14 Product Size (sq cm): 12 Waste Size (sq cm): 0 Amount of Product Applied (sq cm): 12 Instrument Used: Forceps, Scissors Lot #: [id number]; [id number] Expiration Date: 01/03/2023 Fenestrated: No Reconstituted: Yes Solution Type: normal saline Solution Amount: 54ml Lot #: [id number] Solution Expiration Date: 10/17/2019 Secured: Yes Secured With: Steri-Strips Dressing Applied: Yes Primary Dressing: mepitel one Procedural Pain: 0 Post Procedural Pain: 0 Response to Treatment:  Procedure was tolerated well Post Procedure Diagnosis Same as Pre-procedure Electronic Signature(s) Signed: 02/18/2018 3:34:53 PM By: Montey Hora Entered By: Montey Hora on 02/18/2018 09:42:04 Stacie Mcdonald, Stacie Mcdonald ([id number]) -------------------------------------------------------------------------------- HPI Details Patient Name: Stacie Mcdonald, Leontina T. Date of Service: 02/18/2018 9:15 AM Medical Record Number: [id number] Patient Account Number: [id number] Date of Birth/Sex: February 12, 1946 (72 y.o. F) (72 y.o. F) Treating RN: Montey Hora Primary Care Provider: Deborra Medina Other Clinician: Referring Provider: Deborra Medina Treating Provider/Extender: Melburn Hake, HOYT Weeks in Treatment: 3 History of Present Illness HPI Description: 01/28/18 on evaluation today patient presents for initial evaluation concerning a burn to the right breast which occurred roughly 5 weeks ago according to the patient when she fell asleep due to her back pain with a heating pad on her back. Due to the lack of sensation in her breast she subsequently during the night rolled over onto the breast and this caused the burn that she is now been dealing with in the meantime. Unfortunately she still continue to have the back pain as well. According to her records it is stated that she has type II diabetes mellitus which is diet controlled and she's not checking her blood sugars at this point her most recent hemoglobin A1c with 6.5. On evaluation today she does have a eschar covering the majority the surface of the wound at this point and she does not seem to have any significant discomfort although she does have some pain. Patient also does have high blood pressure which is generally fairly well controlled. She was placed on doxycycline. However at this point she has completed that course of therapy. She does have a history of right breast mastectomy secondary to breast cancer which she had in 1991. She separately had a reconstruction  surgery in 1993. She was also given Bactroban ointment by Dr. Derrel Nip who has been managing the burn up to this point. This was in conjunction with the doxycycline. Again fortunately patient does not seem to  have significant pain at this point. 02/04/18 on evaluation today patient appears to be doing rather well in regard to her right lateral breast ulcer. This is secondary to a burn which was thermal in nature. With that being said she seems to have a fairly good granular bed at this point does have some slight epithelialization around the edges of the wound although this is slowly healing it does appear to be some maceration noted as well. She does not seem to have any evidence of infection which is good news. 02/11/18 on evaluation today patient presents for follow-up concerning her right breast ulcer. She has been tolerating the dressing changes without complication. With that being said the wound bed does appear to be doing somewhat better and she does have a skin island and around the 9 o'clock location in regard to the wound. With that being said we have gotten her approved for NuShield. I explained how this could be beneficial for wound healing. She stated that after hearing the information about this she would like to proceed with application to see if this will aid in healing. Fortunately she has no evidence of infection. This is NuShield application #1. 03/19/08 patient presents today after her initial NuShield application last week. She has done very well in the interim since I last saw her. She did take the dressing off to get a shower this morning although she left the mepitel in place. For future I told her she can also remove this to carefully wash the area I do not see any problem with that. With that being said I think it may actually do better as the wound appear to be somewhat macerated I think due to the fact that he got wet doing her shower today. Otherwise she still has excellent  response to the NuShield application she has been tolerating the dressings without complication whatsoever. Patient has no pain. She is here for NuShield application #2 Electronic Signature(s) Signed: 02/18/2018 5:57:55 PM By: Worthy Keeler PA-C Entered By: Worthy Keeler on 02/18/2018 13:10:58 Stacie Mcdonald, Stacie Mcdonald ([id number]) -------------------------------------------------------------------------------- Physical Exam Details Patient Name: Stacie Mcdonald, Stacie T. Date of Service: 02/18/2018 9:15 AM Medical Record Number: [id number] Patient Account Number: [id number] Date of Birth/Sex: 12-19-1945 (72 y.o. F) Treating RN: Montey Hora Primary Care Provider: Deborra Medina Other Clinician: Referring Provider: Deborra Medina Treating Provider/Extender: Melburn Hake, HOYT Weeks in Treatment: 3 Constitutional Well-nourished and well-hydrated in no acute distress. Respiratory normal breathing without difficulty. Psychiatric this patient is able to make decisions and demonstrates good insight into disease process. Alert and Oriented x 3. pleasant and cooperative. Notes Patient's wound had excellent granular bed noted at this point in time. She also had great epithelialization noted just in one short weeks time. This obviously seems to be progressing very nicely as far as healing is concerned at this point. In fact the area which was more of a [place] sticking out into the middle of the wound now has epithelial eyes to crawl separating this into two smaller wound regions. After gently cleansing this off I did go ahead and apply the second application of NuShield. Electronic Signature(s) Signed: 02/18/2018 5:57:55 PM By: Worthy Keeler PA-C Entered By: Worthy Keeler on 02/18/2018 13:11:54 Stacie Mcdonald, Stacie Mcdonald ([id number]) -------------------------------------------------------------------------------- Physician Orders Details Patient Name: Stacie Mcdonald, Stacie T. Date of Service: 02/18/2018 9:15 AM Medical  Record Number: [id number] Patient Account Number: [id number] Date of Birth/Sex: 04-30-1946 (72 y.o. F) Treating RN: Montey Hora Primary Care Provider: Deborra Medina Other Clinician:  Referring Provider: Deborra Medina Treating Provider/Extender: Melburn Hake, HOYT Weeks in Treatment: 3 Verbal / Phone Orders: No Diagnosis Coding ICD-10 Coding Code Description T31.0 Burns involving less than 10% of body surface L98.492 Non-pressure chronic ulcer of skin of other sites with fat layer exposed I10 Essential (primary) hypertension Wound Cleansing Wound #1 Right Breast o Clean wound with Normal Saline. o May Shower, gently pat wound dry prior to applying new dressing. Anesthetic (add to Medication List) Wound #1 Right Breast o Topical Lidocaine 4% cream applied to wound bed prior to debridement (In Clinic Only). Skin Barriers/Peri-Wound Care Wound #1 Right Breast o Skin Prep Primary Wound Dressing Wound #1 Right Breast o Mepitel One Contact layer o Drawtex o Other: - Nusheild applied today Secondary Dressing Wound #1 Right Breast o Boardered Foam Dressing Dressing Change Frequency Wound #1 Right Breast o Change dressing every week - may change outter bandage as needed for drainage Follow-up Appointments Wound #1 Right Breast o Return Appointment in 1 week. Additional Orders / Instructions Wound #1 Right Breast o Vitamin A; Vitamin C, Zinc - Please add a multivitamin with 100% of these vitamins and minerals o Increase protein intake. Stacie Mcdonald, Magally T. ([id number]) Notes Nusheild applied in clinic; including contact layer, fixation with steri strips, drawtex and cover bandage Electronic Signature(s) Signed: 02/18/2018 3:34:53 PM By: Montey Hora Signed: 02/18/2018 5:57:55 PM By: Worthy Keeler PA-C Entered By: Montey Hora on 02/18/2018 09:43:07 Stacie Mcdonald, Stacie Mcdonald  ([id number]) -------------------------------------------------------------------------------- Problem List Details Patient Name: Stacie Mcdonald, Syleena T. Date of Service: 02/18/2018 9:15 AM Medical Record Number: [id number] Patient Account Number: [id number] Date of Birth/Sex: 09-11-1946 (72 y.o. F) Treating RN: Montey Hora Primary Care Provider: Deborra Medina Other Clinician: Referring Provider: Deborra Medina Treating Provider/Extender: Melburn Hake, HOYT Weeks in Treatment: 3 Active Problems ICD-10 Impacting Encounter Code Description Active Date Wound Healing Diagnosis T31.0 Burns involving less than 10% of body surface 01/28/2018 Yes L98.492 Non-pressure chronic ulcer of skin of other sites with fat layer 01/28/2018 Yes exposed [place] (primary) hypertension 01/28/2018 Yes Inactive Problems Resolved Problems Electronic Signature(s) Signed: 02/18/2018 5:57:55 PM By: Worthy Keeler PA-C Entered By: Worthy Keeler on 02/18/2018 09:18:17 Stacie Mcdonald, Stacie Mcdonald ([id number]) -------------------------------------------------------------------------------- Progress Note Details Patient Name: Stacie Mcdonald, Stacie T. Date of Service: 02/18/2018 9:15 AM Medical Record Number: [id number] Patient Account Number: [id number] Date of Birth/Sex: [birth year] (72 y.o. F) Treating RN: Montey Hora Primary Care Provider: Deborra Medina Other Clinician: Referring Provider: Deborra Medina Treating Provider/Extender: Melburn Hake, HOYT Weeks in Treatment: 3 Subjective Chief Complaint Information obtained from Patient Left breast burn History of Present Illness (HPI) 01/28/18 on evaluation today patient presents for initial evaluation concerning a burn to the right breast which occurred roughly 5 weeks ago according to the patient when she fell asleep due to her back pain with a heating pad on her back. Due to the lack of sensation in her breast she subsequently during the night rolled over onto the breast and this caused  the burn that she is now been dealing with in the meantime. Unfortunately she still continue to have the back pain as well. According to her records it is stated that she has type II diabetes mellitus which is diet controlled and she's not checking her blood sugars at this point her most recent hemoglobin A1c with 6.5. On evaluation today she does have a eschar covering the majority the surface of the wound at this point and she does not seem to have any significant discomfort although she does  have some pain. Patient also does have high blood pressure which is generally fairly well controlled. She was placed on doxycycline. However at this point she has completed that course of therapy. She does have a history of right breast mastectomy secondary to breast cancer which she had in 1991. She separately had a reconstruction surgery in 1993. She was also given Bactroban ointment by Dr. Derrel Nip who has been managing the burn up to this point. This was in conjunction with the doxycycline. Again fortunately patient does not seem to have significant pain at this point. 02/04/18 on evaluation today patient appears to be doing rather well in regard to her right lateral breast ulcer. This is secondary to a burn which was thermal in nature. With that being said she seems to have a fairly good granular bed at this point does have some slight epithelialization around the edges of the wound although this is slowly healing it does appear to be some maceration noted as well. She does not seem to have any evidence of infection which is good news. 02/11/18 on evaluation today patient presents for follow-up concerning her right breast ulcer. She has been tolerating the dressing changes without complication. With that being said the wound bed does appear to be doing somewhat better and she does have a skin island and around the 9 o'clock location in regard to the wound. With that being said we have gotten her approved for  NuShield. I explained how this could be beneficial for wound healing. She stated that after hearing the information about this she would like to proceed with application to see if this will aid in healing. Fortunately she has no evidence of infection. This is NuShield application #1. 01/20/63 patient presents today after her initial NuShield application last week. She has done very well in the interim since I last saw her. She did take the dressing off to get a shower this morning although she left the mepitel in place. For future I told her she can also remove this to carefully wash the area I do not see any problem with that. With that being said I think it may actually do better as the wound appear to be somewhat macerated I think due to the fact that he got wet doing her shower today. Otherwise she still has excellent response to the NuShield application she has been tolerating the dressings without complication whatsoever. Patient has no pain. She is here for NuShield application #2 Patient History Information obtained from Patient. Family History Cancer - Father, Diabetes - Siblings, Heart Disease - Mother,Father, Hypertension - Mother, Kidney Disease - Siblings, Lung Disease - Father, Stroke - Father, Thyroid Problems - Siblings, No family history of Seizures, Tuberculosis. Stacie Mcdonald, Stacie T. ([id number]) Social History Never smoker, Marital Status - Married, Alcohol Use - Rarely, Drug Use - No History, Caffeine Use - Daily. 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 Respiratory The patient has no complaints or symptoms. Cardiovascular The patient has no complaints or symptoms. Psychiatric The patient has no complaints or symptoms. Objective Constitutional Well-nourished and well-hydrated in no acute distress. Vitals Time  Taken: 9:17 AM, Height: 64 in, Weight: 183 lbs, BMI: 31.4, Temperature: 98.6 F, Pulse: 103 bpm, Respiratory Rate: 18 breaths/min, Blood Pressure: 167/77 mmHg. Respiratory normal breathing without difficulty. Psychiatric this patient is able to make decisions and demonstrates good insight into disease process. Alert and  Oriented x 3. pleasant and cooperative. General Notes: Patient's wound had excellent granular bed noted at this point in time. She also had great epithelialization noted just in one short weeks time. This obviously seems to be progressing very nicely as far as healing is concerned at this point. In fact the area which was more of a [place] sticking out into the middle of the wound now has epithelial eyes to crawl separating this into two smaller wound regions. After gently cleansing this off I did go ahead and apply the second application of NuShield. Integumentary (Hair, Skin) Wound #1 status is Open. Original cause of wound was Thermal Burn. The wound is located on the Right Breast. The wound measures 4cm length x 3.5cm width x 0.1cm depth; 10.996cm^2 area and 1.1cm^3 volume. There is Fat Layer (Subcutaneous Tissue) Exposed exposed. There is no tunneling or undermining noted. There is a medium amount of serosanguineous drainage noted. The wound margin is distinct with the outline attached to the wound base. There is medium (34-66%) red granulation within the wound bed. There is a medium (34-66%) amount of necrotic tissue within the wound bed including Eschar and Adherent Slough. The periwound skin appearance exhibited: Dry/Scaly. The periwound skin appearance did not exhibit: Callus, Crepitus, Excoriation, Induration, Rash, Scarring, Maceration, Atrophie Blanche, Cyanosis, Ecchymosis, Hemosiderin Staining, Mottled, Pallor, Rubor, Erythema. Periwound temperature was noted as No Abnormality. Stacie Mcdonald, Stacie T. 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 A skin graft procedure using a bioengineered skin substitute/cellular or tissue based product was performed by STONE III, HOYT E., PA-C with the following instrument(s): Forceps and Scissors. Other was applied and secured with Steri-Strips. 12 sq cm of product was utilized and 0 sq cm was wasted. Post Application, mepitel one was applied. A Time Out was conducted at 09:39, prior to the start of the procedure. The procedure was tolerated well with a pain level of 0 throughout and a pain level of 0 following the procedure. Post procedure Diagnosis Wound #1: Same as Pre-Procedure . Plan Wound Cleansing: Wound #1 Right Breast: Clean wound with Normal Saline. May Shower, gently pat wound dry prior to applying new dressing. Anesthetic (add to Medication List): Wound #1 Right Breast: Topical Lidocaine 4% cream applied to wound bed prior to debridement (In Clinic Only). Skin Barriers/Peri-Wound Care: Wound #1 Right Breast: Skin Prep Primary Wound Dressing: Wound #1 Right Breast: Mepitel One Contact layer Drawtex Other: - Nusheild applied today Secondary Dressing: Wound #1 Right Breast: Boardered Foam Dressing Dressing Change Frequency: Wound #1 Right Breast: Change dressing every week - may change outter bandage as needed for drainage Follow-up Appointments: Wound #1 Right Breast: Return Appointment in 1 week. Additional Orders / Instructions: Stacie Mcdonald, Stacie T. ([id number]) Wound #1 Right Breast: Vitamin A; Vitamin C, Zinc - Please add a multivitamin with 100% of these vitamins and minerals Increase protein intake. General Notes: Nusheild applied in clinic; including contact layer, fixation with steri strips, drawtex and  cover bandage I am going to suggest that we continue with the Current wound care measures patient is in agreement with plan especially considering she is doing so well with the current therapy. We will see were things stand in one weeks time. Please see above for specific wound  care orders. We will see patient for re-evaluation in 1 week(s) here in the clinic. If anything worsens or changes patient will contact our office for additional recommendations. Electronic Signature(s) Signed: 02/18/2018 5:57:55 PM By: Worthy Keeler PA-C Entered By: Worthy Keeler on 02/18/2018 13:12:31 Stacie Mcdonald, Stacie Mcdonald ([id number]) -------------------------------------------------------------------------------- ROS/PFSH Details Patient Name: Stacie Mcdonald, Stacie T. Date of Service: 02/18/2018 9:15 AM Medical Record Number: [id number] Patient Account Number: [id number] Date of Birth/Sex: July 03, 1946 (72 y.o. F) Treating RN: Montey Hora Primary Care Provider: Deborra Medina Other Clinician: Referring Provider: Deborra Medina Treating Provider/Extender: Melburn Hake, HOYT Weeks in Treatment: 3 Information Obtained From Patient Wound History Do you currently have one or more open woundso Yes How many open wounds do you currently haveo 1 Approximately how long have you had your woundso 5 weeks How have you been treating your wound(s) until nowo bactroban Has your wound(s) ever healed and then re-openedo No Have you had any lab work done in the past montho No Have you tested positive for an antibiotic resistant organism (MRSA, VRE)o No Have you tested positive for osteomyelitis (bone infection)o No Have you had any tests for circulation on your legso No Constitutional Symptoms (General Health) Complaints and Symptoms: Negative for: Fever; Chills Eyes Medical History: Negative for: Cataracts; Glaucoma; Optic Neuritis Ear/Nose/Mouth/Throat Medical History: Negative for: Chronic sinus problems/congestion; Middle ear  problems Hematologic/Lymphatic Medical History: Negative for: Anemia; Hemophilia; Human Immunodeficiency Virus; Lymphedema; Sickle Cell Disease Respiratory Complaints and Symptoms: No Complaints or Symptoms Medical History: Negative for: Aspiration; Asthma; Pneumothorax; Sleep Apnea; Tuberculosis Past Medical History Notes: 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 ([id number]) Negative for: Angina; Deep Vein Thrombosis; Hypotension; Peripheral Arterial Disease; Peripheral Venous Disease; Phlebitis; Vasculitis Gastrointestinal Medical History: Negative for: Cirrhosis ; Colitis; Crohnos; Hepatitis A; Hepatitis B; Hepatitis C Immunological Medical History: Negative for: Lupus Erythematosus; Raynaudos; Scleroderma Integumentary (Skin) Medical History: Negative for: History of Burn; History of pressure wounds Musculoskeletal Medical History: Positive for: Osteoarthritis Negative for: Gout; Rheumatoid Arthritis; Osteomyelitis Neurologic Medical History: Negative for: Dementia; Neuropathy; Quadriplegia; Paraplegia; Seizure Disorder Oncologic Medical History: Positive for: Received Chemotherapy - 1991; Received Radiation - 1991 Psychiatric Complaints and Symptoms: No Complaints or Symptoms Immunizations Pneumococcal Vaccine: Received Pneumococcal Vaccination: Yes Implantable Devices Family and Social History Cancer: Yes - Father; Diabetes: Yes - Siblings; Heart Disease: Yes - Mother,Father; Hypertension: Yes - Mother; Kidney Disease: Yes - Siblings; Lung Disease: Yes - Father; Seizures: No; Stroke: Yes - Father; Thyroid Problems: Yes - Siblings; Tuberculosis: No; Never smoker; Marital Status - Married; Alcohol Use: Rarely; Drug Use: No History; Caffeine Use:  Daily; Financial Concerns: No; Food, Clothing or Shelter Needs: No; Support System Lacking: No; Transportation Concerns: No; Advanced Directives: Yes (Not Provided); Patient does not want information on Advanced Directives; Do not resuscitate: No; Living Will: Yes (Not Provided); Medical Power of Attorney: Yes (Not Provided) Physician Affirmation I have reviewed and agree with the above information. Electronic Signature(s) Signed: 02/18/2018 3:34:53 PM By: Dorthy, Joanna Stacie Mcdonald, Stacie Mcdonald ([id number]) Signed: 02/18/2018 5:57:55 PM By: Worthy Keeler PA-C Entered By: Worthy Keeler on 02/18/2018 13:11:22 Stacie Mcdonald, Stacie Mcdonald ([id number]) -------------------------------------------------------------------------------- SuperBill Details Patient Name: Stacie Mcdonald, Stacie T. Date of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

## 2018-02-21 NOTE — Telephone Encounter (Signed)
Spoke with pt and informed her that Dr. Derrel Nip is not accepting anymore pts at this time.

## 2018-02-22 DIAGNOSIS — M542 Cervicalgia: Secondary | ICD-10-CM | POA: Diagnosis not present

## 2018-02-22 DIAGNOSIS — M5020 Other cervical disc displacement, unspecified cervical region: Secondary | ICD-10-CM | POA: Diagnosis not present

## 2018-02-22 DIAGNOSIS — M5030 Other cervical disc degeneration, unspecified cervical region: Secondary | ICD-10-CM | POA: Diagnosis not present

## 2018-02-22 DIAGNOSIS — M4722 Other spondylosis with radiculopathy, cervical region: Secondary | ICD-10-CM | POA: Diagnosis not present

## 2018-02-22 DIAGNOSIS — M5412 Radiculopathy, cervical region: Secondary | ICD-10-CM | POA: Diagnosis not present
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
## 2018-02-25 ENCOUNTER — Encounter: Payer: Medicare Other | Admitting: Physician Assistant

## 2018-02-25 DIAGNOSIS — Z853 Personal history of malignant neoplasm of breast: Secondary | ICD-10-CM | POA: Diagnosis not present

## 2018-02-25 DIAGNOSIS — I2510 Atherosclerotic heart disease of native coronary artery without angina pectoris: Secondary | ICD-10-CM | POA: Diagnosis not present

## 2018-02-25 DIAGNOSIS — I110 Hypertensive heart disease with heart failure: Secondary | ICD-10-CM | POA: Diagnosis not present

## 2018-02-25 DIAGNOSIS — T310 Burns involving less than 10% of body surface: Secondary | ICD-10-CM | POA: Diagnosis not present

## 2018-02-25 DIAGNOSIS — E1151 Type 2 diabetes mellitus with diabetic peripheral angiopathy without gangrene: Secondary | ICD-10-CM | POA: Diagnosis not present

## 2018-02-25 DIAGNOSIS — T2131XA Burn of third degree of chest wall, initial encounter: Secondary | ICD-10-CM | POA: Diagnosis not present

## 2018-03-04 ENCOUNTER — Encounter: Payer: Medicare Other | Admitting: Physician Assistant

## 2018-03-04 DIAGNOSIS — Z853 Personal history of malignant neoplasm of breast: Secondary | ICD-10-CM | POA: Diagnosis not present

## 2018-03-04 DIAGNOSIS — T2131XA Burn of third degree of chest wall, initial encounter: Secondary | ICD-10-CM | POA: Diagnosis not present

## 2018-03-04 DIAGNOSIS — I110 Hypertensive heart disease with heart failure: Secondary | ICD-10-CM | POA: Diagnosis not present

## 2018-03-04 DIAGNOSIS — E1151 Type 2 diabetes mellitus with diabetic peripheral angiopathy without gangrene: Secondary | ICD-10-CM | POA: Diagnosis not present

## 2018-03-04 DIAGNOSIS — T310 Burns involving less than 10% of body surface: Secondary | ICD-10-CM | POA: Diagnosis not present

## 2018-03-04 DIAGNOSIS — I2510 Atherosclerotic heart disease of native coronary artery without angina pectoris: Secondary | ICD-10-CM | POA: Diagnosis not present

## 2018-03-04 NOTE — Progress Notes (Signed)
Stacie Mcdonald, Stacie T. ([id number]) Visit Report for 02/25/2018 Chief Complaint Document Details Patient Name: Stacie Mcdonald, Stacie T. Date of Service: 02/25/2018 10:30 AM Medical Record Number: [id number] Patient Account Number: [id number] Date of Birth/Sex: 03/30/1946 (72 y.o. F) Treating RN: Montey Hora Primary Care Provider: Deborra Medina Other Clinician: Referring Provider: Deborra Medina Treating Provider/Extender: Melburn Hake, HOYT Weeks in Treatment: 4 Information Obtained from: Patient Chief Complaint Left breast burn Electronic Signature(s) Signed: 02/27/2018 12:59:06 AM By: Worthy Keeler PA-C Entered By: Worthy Keeler on 02/25/2018 11:13:07 Stacie Mcdonald, Jenetta Downer ([id number]) -------------------------------------------------------------------------------- Cellular or Tissue Based Product Details Patient Name: Stacie Mcdonald, Stacie T. Date of Service: 02/25/2018 10:30 AM Medical Record Number: [id number] Patient Account Number: [id number] Date of Birth/Sex: Sep 23, 1946 (72 y.o. F) Treating RN: Montey Hora Primary Care Provider: Deborra Medina Other Clinician: Referring Provider: Deborra Medina Treating Provider/Extender: Melburn Hake, HOYT Weeks in Treatment: 4 Cellular or Tissue Based Wound #1 Right Breast Product Type Applied to: Performed By: Physician STONE III, HOYT E., PA-C Cellular or Tissue Based Other Product Type: Pre-procedure Verification/Time Yes - 11:45 Out Taken: Location: trunk / arms / legs Wound Size (sq cm): 14.08 Product Size (sq cm): 12 Waste Size (sq cm): 0 Amount of Product Applied (sq cm): 12 Instrument Used: Forceps, Scissors Lot #: [id number]; [id number] Expiration Date: 01/31/2023 Fenestrated: No Reconstituted: Yes Solution Type: normal saline Solution Amount: 87ml Lot #: [id number] Solution Expiration Date: 10/17/2019 Secured: Yes Secured With: Steri-Strips Dressing Applied: Yes Primary Dressing: mepitel one Procedural Pain: 0 Post Procedural Pain: 0 Response to  Treatment: Procedure was tolerated well Post Procedure Diagnosis Same as Pre-procedure Electronic Signature(s) Signed: 02/25/2018 4:45:51 PM By: Montey Hora Entered By: Montey Hora on 02/25/2018 11:46:42 Stacie Mcdonald, Jenetta Downer ([id number]) -------------------------------------------------------------------------------- Debridement Details Patient Name: Stacie Mcdonald, Stacie T. Date of Service: 02/25/2018 10:30 AM Medical Record Number: [id number] Patient Account Number: [id number] Date of Birth/Sex: 09/28/1946 (72 y.o. F) Treating RN: Montey Hora Primary Care Provider: Deborra Medina Other Clinician: Referring Provider: Deborra Medina Treating Provider/Extender: Melburn Hake, HOYT Weeks in Treatment: 4 Debridement Performed for Wound #1 Right Breast Assessment: Performed By: Physician STONE III, HOYT E., PA-C Debridement Type: Debridement Pre-procedure Verification/Time Yes - 11:41 Out Taken: Start Time: 11:41 Pain Control: Lidocaine 4% Topical Solution Total Area Debrided (L x W): 4.4 (cm) x 3.2 (cm) = 14.08 (cm) Tissue and other material Non-Viable, Slough, Biofilm, Slough debrided: Level: Non-Viable Tissue Debridement Description: Selective/Open Wound Instrument: Curette Bleeding: Minimum Hemostasis Achieved: Pressure End Time: 11:43 Procedural Pain: 0 Post Procedural Pain: 0 Response to Treatment: Procedure was tolerated well Post Debridement Measurements of Total Wound Length: (cm) 4.4 Width: (cm) 3.2 Depth: (cm) 0.1 Volume: (cm) 1.106 Character of Wound/Ulcer Post Debridement: Improved Post Procedure Diagnosis Same as Pre-procedure Electronic Signature(s) Signed: 02/25/2018 4:45:51 PM By: Montey Hora Signed: 02/27/2018 12:59:06 AM By: Worthy Keeler PA-C Entered By: Montey Hora on 02/25/2018 11:43:41 Stacie Mcdonald, Jenetta Downer ([id number]) -------------------------------------------------------------------------------- HPI Details Patient Name: Stacie Mcdonald, Stacie T. Date  of Service: 02/25/2018 10:30 AM Medical Record Number: [id number] Patient Account Number: [id number] Date of Birth/Sex: 1945/12/22 (72 y.o. F) Treating RN: Montey Hora Primary Care Provider: Deborra Medina Other Clinician: Referring Provider: Deborra Medina Treating Provider/Extender: Melburn Hake, HOYT Weeks in Treatment: 4 History of Present Illness HPI Description: 01/28/18 on evaluation today patient presents for initial evaluation concerning a burn to the right breast which occurred roughly 5 weeks ago according to the patient when she fell asleep due to her back pain with a heating pad on her back. Due to  the lack of sensation in her breast she subsequently during the night rolled over onto the breast and this caused the burn that she is now been dealing with in the meantime. Unfortunately she still continue to have the back pain as well. According to her records it is stated that she has type II diabetes mellitus which is diet controlled and she's not checking her blood sugars at this point her most recent hemoglobin A1c with 6.5. On evaluation today she does have a eschar covering the majority the surface of the wound at this point and she does not seem to have any significant discomfort although she does have some pain. Patient also does have high blood pressure which is generally fairly well controlled. She was placed on doxycycline. However at this point she has completed that course of therapy. She does have a history of right breast mastectomy secondary to breast cancer which she had in 1991. She separately had a reconstruction surgery in 1993. She was also given Bactroban ointment by Dr. Derrel Nip who has been managing the burn up to this point. This was in conjunction with the doxycycline. Again fortunately patient does not seem to have significant pain at this point. 02/04/18 on evaluation today patient appears to be doing rather well in regard to her right lateral breast ulcer. This  is secondary to a burn which was thermal in nature. With that being said she seems to have a fairly good granular bed at this point does have some slight epithelialization around the edges of the wound although this is slowly healing it does appear to be some maceration noted as well. She does not seem to have any evidence of infection which is good news. 02/11/18 on evaluation today patient presents for follow-up concerning her right breast ulcer. She has been tolerating the dressing changes without complication. With that being said the wound bed does appear to be doing somewhat better and she does have a skin island and around the 9 o'clock location in regard to the wound. With that being said we have gotten her approved for NuShield. I explained how this could be beneficial for wound healing. She stated that after hearing the information about this she would like to proceed with application to see if this will aid in healing. Fortunately she has no evidence of infection. This is NuShield application #1. 03/21/42 patient presents today after her initial NuShield application last week. She has done very well in the interim since I last saw her. She did take the dressing off to get a shower this morning although she left the mepitel in place. For future I told her she can also remove this to carefully wash the area I do not see any problem with that. With that being said I think it may actually do better as the wound appear to be somewhat macerated I think due to the fact that he got wet doing her shower today. Otherwise she still has excellent response to the NuShield application she has been tolerating the dressings without complication whatsoever. Patient has no pain. She is here for NuShield application #2 02/11/50 on evaluation today patient is status post having had her second NuShield application which she seems to have done very well with at this point. She has evidence of great  epithelialization and this one seems to be progressing very nicely. She is actually here for NuShield application #3 Electronic Signature(s) Signed: 02/27/2018 12:59:06 AM By: Worthy Keeler PA-C Entered By: Worthy Keeler  on 02/27/2018 00:16:22 Stacie Mcdonald, Margene T. ([id number]) -------------------------------------------------------------------------------- Physical Exam Details Patient Name: Stacie Mcdonald, Jenisha T. Date of Service: 02/25/2018 10:30 AM Medical Record Number: [id number] Patient Account Number: [id number] Date of Birth/Sex: 1946/08/04 (72 y.o. F) Treating RN: Montey Hora Primary Care Provider: Deborra Medina Other Clinician: Referring Provider: Deborra Medina Treating Provider/Extender: Melburn Hake, HOYT Weeks in Treatment: 4 Constitutional Well-nourished and well-hydrated in no acute distress. Respiratory normal breathing without difficulty. Psychiatric this patient is able to make decisions and demonstrates good insight into disease process. Alert and Oriented x 3. pleasant and cooperative. Notes Patient's wound did require some slight sharp debridement today to clean away surface biofilm and a very slight amount of slough. There was very little removed but what was removed post debridement help the wound beds appear even that much better she will definitely have good contact for the graft to come in contact with her skin. I then subsequently applied the NuShield skin substitute without complication today. Electronic Signature(s) Signed: 02/27/2018 12:59:06 AM By: Worthy Keeler PA-C Entered By: Worthy Keeler on 02/27/2018 00:17:17 Stacie Mcdonald, Jenetta Downer ([id number]) -------------------------------------------------------------------------------- Physician Orders Details Patient Name: Stacie Mcdonald, Jadon T. Date of Service: 02/25/2018 10:30 AM Medical Record Number: [id number] Patient Account Number: [id number] Date of Birth/Sex: Feb 15, 1946 (72 y.o. F) Treating RN: Montey Hora Primary Care Provider: Deborra Medina Other Clinician: Referring Provider: Deborra Medina Treating Provider/Extender: Melburn Hake, HOYT Weeks in Treatment: 4 Verbal / Phone Orders: No Diagnosis Coding ICD-10 Coding Code Description T31.0 Burns involving less than 10% of body surface L98.492 Non-pressure chronic ulcer of skin of other sites with fat layer exposed I10 Essential (primary) hypertension Wound Cleansing Wound #1 Right Breast o Clean wound with Normal Saline. o May Shower, gently pat wound dry prior to applying new dressing. Anesthetic (add to Medication List) Wound #1 Right Breast o Topical Lidocaine 4% cream applied to wound bed prior to debridement (In Clinic Only). Skin Barriers/Peri-Wound Care Wound #1 Right Breast o Skin Prep Primary Wound Dressing Wound #1 Right Breast o Mepitel One Contact layer o Drawtex o Other: - Nusheild applied today Secondary Dressing Wound #1 Right Breast o Boardered Foam Dressing Dressing Change Frequency Wound #1 Right Breast o Change dressing every week - may change outter bandage as needed for drainage Follow-up Appointments Wound #1 Right Breast o Return Appointment in 1 week. Additional Orders / Instructions Wound #1 Right Breast o Vitamin A; Vitamin C, Zinc - Please add a multivitamin with 100% of these vitamins and minerals o Increase protein intake. Stacie Mcdonald, Fae T. ([id number]) Notes Nusheild applied in clinic; including contact layer, fixation with steri strips, drawtex and cover bandage Electronic Signature(s) Signed: 02/25/2018 4:45:51 PM By: Montey Hora Signed: 02/27/2018 12:59:06 AM By: Worthy Keeler PA-C Entered By: Montey Hora on 02/25/2018 11:39:54 Stacie Mcdonald, Jenetta Downer ([id number]) -------------------------------------------------------------------------------- Problem List Details Patient Name: Stacie Mcdonald, Tanee T. Date of Service: 02/25/2018 10:30 AM Medical Record Number:  [id number] Patient Account Number: [id number] Date of Birth/Sex: 1946/09/15 (72 y.o. F) Treating RN: Montey Hora Primary Care Provider: Deborra Medina Other Clinician: Referring Provider: Deborra Medina Treating Provider/Extender: Melburn Hake, HOYT Weeks in Treatment: 4 Active Problems ICD-10 Impacting Encounter Code Description Active Date Wound Healing Diagnosis T31.0 Burns involving less than 10% of body surface 01/28/2018 Yes L98.492 Non-pressure chronic ulcer of skin of other sites with fat layer 01/28/2018 Yes exposed [place] (primary) hypertension 01/28/2018 Yes Inactive Problems Resolved Problems Electronic Signature(s) Signed: 02/27/2018 12:59:06 AM By: Worthy Keeler PA-C Entered By: Worthy Keeler  on 02/25/2018 11:12:59 Stacie Mcdonald, Anaily T. ([id number]) -------------------------------------------------------------------------------- Progress Note Details Patient Name: Stacie Mcdonald, Alexianna T. Date of Service: 02/25/2018 10:30 AM Medical Record Number: [id number] Patient Account Number: [id number] Date of Birth/Sex: 1946-01-02 (72 y.o. F) Treating RN: Montey Hora Primary Care Provider: Deborra Medina Other Clinician: Referring Provider: Deborra Medina Treating Provider/Extender: Melburn Hake, HOYT Weeks in Treatment: 4 Subjective Chief Complaint Information obtained from Patient Left breast burn History of Present Illness (HPI) 01/28/18 on evaluation today patient presents for initial evaluation concerning a burn to the right breast which occurred roughly 5 weeks ago according to the patient when she fell asleep due to her back pain with a heating pad on her back. Due to the lack of sensation in her breast she subsequently during the night rolled over onto the breast and this caused the burn that she is now been dealing with in the meantime. Unfortunately she still continue to have the back pain as well. According to her records it is stated that she has type II diabetes mellitus  which is diet controlled and she's not checking her blood sugars at this point her most recent hemoglobin A1c with 6.5. On evaluation today she does have a eschar covering the majority the surface of the wound at this point and she does not seem to have any significant discomfort although she does have some pain. Patient also does have high blood pressure which is generally fairly well controlled. She was placed on doxycycline. However at this point she has completed that course of therapy. She does have a history of right breast mastectomy secondary to breast cancer which she had in 1991. She separately had a reconstruction surgery in 1993. She was also given Bactroban ointment by Dr. Derrel Nip who has been managing the burn up to this point. This was in conjunction with the doxycycline. Again fortunately patient does not seem to have significant pain at this point. 02/04/18 on evaluation today patient appears to be doing rather well in regard to her right lateral breast ulcer. This is secondary to a burn which was thermal in nature. With that being said she seems to have a fairly good granular bed at this point does have some slight epithelialization around the edges of the wound although this is slowly healing it does appear to be some maceration noted as well. She does not seem to have any evidence of infection which is good news. 02/11/18 on evaluation today patient presents for follow-up concerning her right breast ulcer. She has been tolerating the dressing changes without complication. With that being said the wound bed does appear to be doing somewhat better and she does have a skin island and around the 9 o'clock location in regard to the wound. With that being said we have gotten her approved for NuShield. I explained how this could be beneficial for wound healing. She stated that after hearing the information about this she would like to proceed with application to see if this will aid in  healing. Fortunately she has no evidence of infection. This is NuShield application #1. 11/24/27 patient presents today after her initial NuShield application last week. She has done very well in the interim since I last saw her. She did take the dressing off to get a shower this morning although she left the mepitel in place. For future I told her she can also remove this to carefully wash the area I do not see any problem with that. With that being said I think it may  actually do better as the wound appear to be somewhat macerated I think due to the fact that he got wet doing her shower today. Otherwise she still has excellent response to the NuShield application she has been tolerating the dressings without complication whatsoever. Patient has no pain. She is here for NuShield application #2 05/09/75 on evaluation today patient is status post having had her second NuShield application which she seems to have done very well with at this point. She has evidence of great epithelialization and this one seems to be progressing very nicely. She is actually here for NuShield application #3 Patient History Information obtained from Patient. Family History Stacie Mcdonald, Viva T. ([id number]) Cancer - Father, Diabetes - Siblings, Heart Disease - Mother,Father, Hypertension - Mother, Kidney Disease - Siblings, Lung Disease - Father, Stroke - Father, Thyroid Problems - Siblings, No family history of Seizures, Tuberculosis. Social History Never smoker, Marital Status - Married, Alcohol Use - Rarely, Drug Use - No History, Caffeine Use - Daily. 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 Respiratory The patient has no complaints or symptoms. Cardiovascular The patient has no complaints or symptoms. Psychiatric The patient has no complaints or  symptoms. Objective Constitutional Well-nourished and well-hydrated in no acute distress. Vitals Time Taken: 10:48 AM, Height: 64 in, Weight: 183 lbs, BMI: 31.4, Temperature: 98.7 F, Pulse: 88 bpm, Respiratory Rate: 18 breaths/min, Blood Pressure: 142/62 mmHg. Respiratory normal breathing without difficulty. Psychiatric this patient is able to make decisions and demonstrates good insight into disease process. Alert and Oriented x 3. pleasant and cooperative. General Notes: Patient's wound did require some slight sharp debridement today to clean away surface biofilm and a very slight amount of slough. There was very little removed but what was removed post debridement help the wound beds appear even that much better she will definitely have good contact for the graft to come in contact with her skin. I then subsequently applied the NuShield skin substitute without complication today. Integumentary (Hair, Skin) Wound #1 status is Open. Original cause of wound was Thermal Burn. The wound is located on the Right Breast. The wound measures 4.4cm length x 3.2cm width x 0.1cm depth; 11.058cm^2 area and 1.106cm^3 volume. There is Fat Layer (Subcutaneous Tissue) Exposed exposed. There is no tunneling or undermining noted. There is a medium amount of serosanguineous drainage noted. The wound margin is distinct with the outline attached to the wound base. There is medium (34-66%) red granulation within the wound bed. There is a medium (34-66%) amount of necrotic tissue within the wound bed including Eschar and Adherent Slough. The periwound skin appearance exhibited: Dry/Scaly. The periwound skin appearance did not exhibit: Callus, Crepitus, Excoriation, Induration, Rash, Scarring, Maceration, Atrophie Blanche, Cyanosis, Stacie Mcdonald, [place] T. ([id number]) Ecchymosis, Hemosiderin Staining, Mottled, Pallor, Rubor, Erythema. Periwound temperature was noted as No Abnormality. 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 There was a Selective/Open Wound Non-Viable Tissue Debridement with a total area of 14.08 sq cm performed by STONE III, HOYT E., PA-C. With the following instrument(s): Curette. to remove Non-Viable tissue/material Material removed includes and Slough, Biofilm, and Slough after achieving pain control using Lidocaine  4% Topical Solution. No specimens were taken. A time out was conducted at 11:41, prior to the start of the procedure. A Minimum amount of bleeding was controlled with Pressure. The procedure was tolerated well with a pain level of 0 throughout and a pain level of 0 following the procedure. Post Debridement Measurements: 4.4cm length x 3.2cm width x 0.1cm depth; 1.106cm^3 volume. Character of Wound/Ulcer Post Debridement is improved. Post procedure Diagnosis Wound #1: Same as Pre-Procedure Pre-procedure diagnosis of Wound #1 is a 3rd degree Burn located on the Right Breast. A skin graft procedure using a bioengineered skin substitute/cellular or tissue based product was performed by STONE III, HOYT E., PA-C with the following instrument(s): Forceps and Scissors. Other was applied and secured with Steri-Strips. 12 sq cm of product was utilized and 0 sq cm was wasted. Post Application, mepitel one was applied. A Time Out was conducted at 11:45, prior to the start of the procedure. The procedure was tolerated well with a pain level of 0 throughout and a pain level of 0 following the procedure. Post procedure Diagnosis Wound #1: Same as Pre-Procedure . Plan Wound Cleansing: Wound #1 Right Breast: Clean wound with Normal Saline. May Shower, gently pat wound dry prior to applying new  dressing. Anesthetic (add to Medication List): Wound #1 Right Breast: Topical Lidocaine 4% cream applied to wound bed prior to debridement (In Clinic Only). Skin Barriers/Peri-Wound Care: Wound #1 Right Breast: Skin Prep Primary Wound Dressing: Stacie Mcdonald, Mathew T. ([id number]) Wound #1 Right Breast: Mepitel One Contact layer Drawtex Other: - Nusheild applied today Secondary Dressing: Wound #1 Right Breast: Boardered Foam Dressing Dressing Change Frequency: Wound #1 Right Breast: Change dressing every week - may change outter bandage as needed for drainage Follow-up Appointments: Wound #1 Right Breast: Return Appointment in 1 week. Additional Orders / Instructions: Wound #1 Right Breast: Vitamin A; Vitamin C, Zinc - Please add a multivitamin with 100% of these vitamins and minerals Increase protein intake. General Notes: Nusheild applied in clinic; including contact layer, fixation with steri strips, drawtex and cover bandage At this point I'm going to suggest that we continue with the NuShield application the patient seems to be doing very well at this point. She's in agreement with this plan. We will subsequently see were things stand in one weeks time. Please see above for specific wound care orders. We will see patient for re-evaluation in 1 week(s) here in the clinic. If anything worsens or changes patient will contact our office for additional recommendations. Electronic Signature(s) Signed: 02/27/2018 12:59:06 AM By: Worthy Keeler PA-C Entered By: Worthy Keeler on 02/27/2018 00:17:59 Stacie Mcdonald, Jenetta Downer ([id number]) -------------------------------------------------------------------------------- ROS/PFSH Details Patient Name: Stacie Mcdonald, Tonie T. Date of Service: 02/25/2018 10:30 AM Medical Record Number: [id number] Patient Account Number: [id number] Date of Birth/Sex: 04/29/1946 (72 y.o. F) Treating RN: Montey Hora Primary Care Provider: Deborra Medina Other  Clinician: Referring Provider: Deborra Medina Treating Provider/Extender: Melburn Hake, HOYT Weeks in Treatment: 4 Information Obtained From Patient Wound History Do you currently have one or more open woundso Yes How many open wounds do you currently haveo 1 Approximately how long have you had your woundso 5 weeks How have you been treating your wound(s) until nowo bactroban Has your wound(s) ever healed and then re-openedo No Have you had any lab work done in the past montho No Have you tested positive for an antibiotic resistant organism (MRSA, VRE)o No Have you tested positive for osteomyelitis (bone infection)o No Have you had any tests for  circulation on your legso No Constitutional Symptoms (General Health) Complaints and Symptoms: Negative for: Fever; Chills Eyes Medical History: Negative for:

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
## 2018-03-10 NOTE — Progress Notes (Signed)
Stacie Mcdonald, Syrita T. ([id number]) Visit Report for 03/04/2018 Arrival Information Details Patient Name: Stacie Mcdonald, Stacie T. Date of Service: 03/04/2018 2:00 PM Medical Record Number: [id number] Patient Account Number: [id number] Date of Birth/Sex: 03-03-1946 (72 y.o. F) Treating RN: Roger Shelter Primary Care Dekendrick Uzelac: Deborra Medina Other Clinician: Referring Mcgwire Dasaro: Deborra Medina Treating Jaylissa Felty/Extender: Melburn Hake, HOYT Weeks in Treatment: 5 Visit Information History Since Last Visit All ordered tests and consults were completed: No Patient Arrived: Ambulatory Added or deleted any medications: No Arrival Time: 13:53 Any new allergies or adverse reactions: No Accompanied By: self Had a fall or experienced change in No Transfer Assistance: None activities of daily living that may affect Patient Identification Verified: Yes risk of falls: Secondary Verification Process Completed: Yes Signs or symptoms of abuse/neglect since last visito No Hospitalized since last visit: No Implantable device outside of the clinic excluding No cellular tissue based products placed in the center since last visit: Pain Present Now: No Electronic Signature(s) Signed: 03/07/2018 4:40:07 PM By: Roger Shelter Entered By: Roger Shelter on 03/04/2018 13:57:25 Stacie Mcdonald, Stacie Mcdonald ([id number]) -------------------------------------------------------------------------------- Encounter Discharge Information Details Patient Name: Stacie Mcdonald, Stacie T. Date of Service: 03/04/2018 2:00 PM Medical Record Number: [id number] Patient Account Number: [id number] Date of Birth/Sex: 1946/07/14 (72 y.o. F) Treating RN: Montey Hora Primary Care [place]: Deborra Medina Other Clinician: Referring Delena Casebeer: Deborra Medina Treating Asahel Risden/Extender: Melburn Hake, HOYT Weeks in Treatment: 5 Encounter Discharge Information Items Discharge Pain Level: 0 Discharge Condition: Stable Ambulatory Status: Ambulatory Discharge  Destination: Home Transportation: Private Auto Accompanied By: self Schedule Follow-up Appointment: Yes Medication Reconciliation completed and No provided to Patient/Care Bynum Mccullars: Provided on Clinical Summary of Care: 03/04/2018 Form Type Recipient Paper Patient [facility] Electronic Signature(s) Signed: 03/04/2018 3:18:16 PM By: Alric Quan Entered By: Alric Quan on 03/04/2018 15:18:16 Stacie Mcdonald, Stacie Mcdonald ([id number]) -------------------------------------------------------------------------------- Lower Extremity Assessment Details Patient Name: Stacie Mcdonald, Stacie T. Date of Service: 03/04/2018 2:00 PM Medical Record Number: [id number] Patient Account Number: [id number] Date of Birth/Sex: [birth year] (72 y.o. F) Treating RN: Roger Shelter Primary Care Jeroline Wolbert: Deborra Medina Other Clinician: Referring Lemoyne Scarpati: Deborra Medina Treating Kalika Smay/Extender: Melburn Hake, HOYT Weeks in Treatment: 5 Electronic Signature(s) Signed: 03/07/2018 4:40:07 PM By: Roger Shelter Entered By: Roger Shelter on 03/04/2018 14:03:12 Stacie Mcdonald, Stacie Mcdonald ([id number]) -------------------------------------------------------------------------------- Multi Wound Chart Details Patient Name: Stacie Mcdonald, Stacie T. Date of Service: 03/04/2018 2:00 PM Medical Record Number: [id number] Patient Account Number: [id number] Date of Birth/Sex: 1946/02/07 (72 y.o. F) Treating RN: Montey Hora Primary Care Jillienne Egner: Deborra Medina Other Clinician: Referring Gust Eugene: Deborra Medina Treating Terril Chestnut/Extender: Melburn Hake, HOYT Weeks in Treatment: 5 Vital Signs Height(in): 64 Pulse(bpm): 91 Weight(lbs): 183 Blood Pressure(mmHg): 144/75 Body Mass Index(BMI): 31 Temperature(F): 98.6 Respiratory Rate 18 (breaths/min): Photos: [1:No Photos] [N/A:N/A] Wound Location: [1:Right Breast] [N/A:N/A] Wounding Event: [1:Thermal Burn] [N/A:N/A] Primary Etiology: [1:3rd degree Burn] [N/A:N/A] Comorbid History: [1:Arrhythmia,  Congestive Heart Failure, Coronary Artery Disease, Hypertension, Myocardial Infarction, Osteoarthritis, Received Chemotherapy, Received Radiation] [N/A:N/A] Date Acquired: [1:01/14/2018] [N/A:N/A] Weeks of Treatment: [1:5] [N/A:N/A] Wound Status: [1:Open] [N/A:N/A] Measurements L x W x D [1:4x4x0.1] [N/A:N/A] (cm) Area (cm) : [1:12.566] [N/A:N/A] Volume (cm) : [1:1.257] [N/A:N/A] % Reduction in Area: [1:44.70%] [N/A:N/A] % Reduction in Volume: [1:44.60%] [N/A:N/A] Classification: [1:Full Thickness Without Exposed Support Structures] [N/A:N/A] Exudate Amount: [1:Medium] [N/A:N/A] Exudate Type: [1:Serosanguineous] [N/A:N/A] Exudate Color: [1:red, brown] [N/A:N/A] Wound Margin: [1:Distinct, outline attached] [N/A:N/A] Granulation Amount: [1:Medium (34-66%)] [N/A:N/A] Granulation Quality: [1:Red] [N/A:N/A] Necrotic Amount: [1:Medium (34-66%)] [N/A:N/A] Necrotic Tissue: [1:Eschar, Adherent Slough] [N/A:N/A] Exposed Structures: [1:Fat Layer (Subcutaneous Tissue) Exposed: Yes Fascia: No  Tendon: No Muscle: No Joint: No Bone: No] [N/A:N/A] Epithelialization: None N/A N/A Periwound Skin Texture: Excoriation: No N/A N/A Induration: No Callus: No Crepitus: No Rash: No Scarring: No Periwound Skin Moisture: Dry/Scaly: Yes N/A N/A Maceration: No Periwound Skin Color: Atrophie Blanche: No N/A N/A Cyanosis: No Ecchymosis: No Erythema: No Hemosiderin Staining: No Mottled: No Pallor: No Rubor: No Temperature: No Abnormality N/A N/A Tenderness on Palpation: No N/A N/A Wound Preparation: Ulcer Cleansing: N/A N/A Rinsed/Irrigated with Saline Topical Anesthetic Applied: Other: lidocaine 4% Treatment Notes Electronic Signature(s) Signed: 03/04/2018 3:07:03 PM By: Montey Hora Entered By: Montey Hora on 03/04/2018 14:28:46 Stacie Mcdonald, Stacie Mcdonald ([id number]) -------------------------------------------------------------------------------- [place] Details Patient  Name: Stacie Mcdonald, Stacie T. Date of Service: 03/04/2018 2:00 PM Medical Record Number: [id number] Patient Account Number: [id number] Date of Birth/Sex: February 16, 1946 (72 y.o. F) Treating RN: Montey Hora Primary Care Woodroe Vogan: Deborra Medina Other Clinician: Referring Nayeli Calvert: Deborra Medina Treating Shreena Baines/Extender: Melburn Hake, HOYT Weeks in Treatment: 5 Active Inactive ` Orientation to the Wound Care Program Nursing Diagnoses: Knowledge deficit related to the wound healing center program Goals: Patient/caregiver will verbalize understanding of the [facility] Program Date Initiated: 01/28/2018 Target Resolution Date: 04/23/2018 Goal Status: Active Interventions: Provide education on orientation to the wound center Notes: ` Wound/Skin Impairment Nursing Diagnoses: Impaired tissue integrity Goals: Ulcer/skin breakdown will heal within 14 weeks Date Initiated: 01/28/2018 Target Resolution Date: 04/23/2018 Goal Status: Active Interventions: Assess patient/caregiver ability to obtain necessary supplies Assess patient/caregiver ability to perform ulcer/skin care regimen upon admission and as needed Assess ulceration(s) every visit Notes: Electronic Signature(s) Signed: 03/04/2018 3:07:03 PM By: Montey Hora Entered By: Montey Hora on 03/04/2018 14:28:35 Stacie Mcdonald, Stacie Mcdonald ([id number]) -------------------------------------------------------------------------------- Pain Assessment Details Patient Name: Stacie Mcdonald, Stacie T. Date of Service: 03/04/2018 2:00 PM Medical Record Number: [id number] Patient Account Number: [id number] Date of Birth/Sex: 19-Jun-1946 (72 y.o. F) Treating RN: Roger Shelter Primary Care Danayah Smyre: Deborra Medina Other Clinician: Referring Vartan Kerins: Deborra Medina Treating Miyana Mordecai/Extender: Melburn Hake, HOYT Weeks in Treatment: 5 Active Problems Location of Pain Severity and Description of Pain Patient Has Paino No Site Locations Pain Management and  Medication Current Pain Management: Electronic Signature(s) Signed: 03/07/2018 4:40:07 PM By: Roger Shelter Entered By: Roger Shelter on 03/04/2018 13:57:39 Stacie Mcdonald, Stacie Mcdonald ([id number]) -------------------------------------------------------------------------------- Patient/Caregiver Education Details Patient Name: Stacie Mcdonald, Stacie T. Date of Service: 03/04/2018 2:00 PM Medical Record Number: [id number] Patient Account Number: [id number] Date of Birth/Gender: 08-02-1946 (72 y.o. F) Treating RN: Ahmed Prima Primary Care Physician: Deborra Medina Other Clinician: Referring Physician: Deborra Medina Treating Physician/Extender: Melburn Hake, HOYT Weeks in Treatment: 5 Education Assessment Education Provided To: Patient Education Topics Provided Wound/Skin Impairment: Handouts: Caring for Your Ulcer, Other: change dressing as ordered Methods: Demonstration, Explain/Verbal Responses: State content correctly Electronic Signature(s) Signed: 03/04/2018 4:41:07 PM By: Alric Quan Entered By: Alric Quan on 03/04/2018 15:18:32 Stacie Mcdonald, Stacie Mcdonald ([id number]) -------------------------------------------------------------------------------- Wound Assessment Details Patient Name: Stacie Mcdonald, Stacie T. Date of Service: 03/04/2018 2:00 PM Medical Record Number: [id number] Patient Account Number: [id number] Date of Birth/Sex: 05-29-1946 (72 y.o. F) Treating RN: Roger Shelter Primary Care Croy Drumwright: Deborra Medina Other Clinician: Referring Candise Crabtree: Deborra Medina Treating Tiras Bianchini/Extender: Melburn Hake, HOYT Weeks in Treatment: 5 Wound Status Wound Number: 1 Primary 3rd degree Burn Etiology: Wound Location: Right Breast Wound Open Wounding Event: Thermal Burn Status: Date Acquired: 01/14/2018 Comorbid Arrhythmia, Congestive Heart Failure, Coronary Weeks Of Treatment: 5 History: Artery Disease, Hypertension, Myocardial Clustered Wound: No Infarction, Osteoarthritis,  Received Chemotherapy, Received Radiation Photos Photo Uploaded By: Roger Shelter on 03/04/2018  16:04:05 Wound Measurements Length: (cm) 4 Width: (cm) 4 Depth: (cm) 0.1 Area: (cm) 12.566 Volume: (cm) 1.257 % Reduction in Area: 44.7% % Reduction in Volume: 44.6% Epithelialization: None Tunneling: No Undermining: No Wound Description Full Thickness Without Exposed Support Classification: Structures Wound Margin: Distinct, outline attached Exudate Medium Amount: Exudate Type: Serosanguineous Exudate Color: red, brown Foul Odor After Cleansing: No Slough/Fibrino Yes Wound Bed Granulation Amount: Medium (34-66%) Exposed Structure Granulation Quality: Red Fascia Exposed: No Necrotic Amount: Medium (34-66%) Fat Layer (Subcutaneous Tissue) Exposed: Yes Necrotic Quality: Eschar, Adherent Slough Tendon Exposed: No Muscle Exposed: No Joint Exposed: No Stacie Mcdonald, Stacie T. ([id number]) Bone Exposed: No Periwound Skin Texture Texture Color No Abnormalities Noted: No No Abnormalities Noted: No Callus: No Atrophie Blanche: No Crepitus: No Cyanosis: No Excoriation: No Ecchymosis: No Induration: No Erythema: No Rash: No Hemosiderin Staining: No Scarring: No Mottled: No Pallor: No Moisture Rubor: No No Abnormalities Noted: No Dry / Scaly: Yes Temperature / Pain Maceration: No Temperature: No Abnormality Wound Preparation Ulcer Cleansing: Rinsed/Irrigated with Saline Topical Anesthetic Applied: Other: lidocaine 4%, Treatment Notes Wound #1 (Right Breast) 1. Cleansed with: Clean wound with Normal Saline 2. Anesthetic Topical Lidocaine 4% cream to wound bed prior to debridement 3. Peri-wound Care: Skin Prep 5. Secondary Dressing Applied Bordered Foam Dressing Notes NuShield, drawtex, steri-strips, mepitel Electronic Signature(s) Signed: 03/07/2018 4:40:07 PM By: Roger Shelter Entered By: Roger Shelter on 03/04/2018 14:03:03 Stacie Mcdonald, Stacie Mcdonald  ([id number]) -------------------------------------------------------------------------------- Vitals Details Patient Name: Stacie Mcdonald, Stacie T. Date of Service: 03/04/2018 2:00 PM Medical Record Number: [id number] Patient Account Number: [id number] Date of Birth/Sex: [birth year] (72 y.o. F) Treating RN: Roger Shelter Primary Care Ciera Beckum: Deborra Medina Other Clinician: Referring Daivon Rayos: Deborra Medina Treating Teyah Rossy/Extender: Melburn Hake, HOYT Weeks in Treatment: 5 Vital Signs Time Taken: 13:58 Temperature (F): 98.6 Height (in): 64 Pulse (bpm): 91 Weight (lbs): 183 Respiratory Rate (breaths/min): 18 Body Mass Index (BMI): 31.4 Blood Pressure (mmHg): 144/75 Reference Range: 80 - 120 mg / dl Electronic Signature(s) Signed: 03/07/2018 4:40:07 PM By: Roger Shelter Entered By: Roger Shelter on 03/04/2018 13:58:49

## 2018-03-11 ENCOUNTER — Encounter: Payer: Medicare Other | Admitting: Physician Assistant

## 2018-03-11 DIAGNOSIS — I2510 Atherosclerotic heart disease of native coronary artery without angina pectoris: Secondary | ICD-10-CM | POA: Diagnosis not present

## 2018-03-11 DIAGNOSIS — T2131XA Burn of third degree of chest wall, initial encounter: Secondary | ICD-10-CM | POA: Diagnosis not present

## 2018-03-11 DIAGNOSIS — L98492 Non-pressure chronic ulcer of skin of other sites with fat layer exposed: Secondary | ICD-10-CM | POA: Diagnosis not present

## 2018-03-11 DIAGNOSIS — T310 Burns involving less than 10% of body surface: Secondary | ICD-10-CM | POA: Diagnosis not present

## 2018-03-11 DIAGNOSIS — I110 Hypertensive heart disease with heart failure: Secondary | ICD-10-CM | POA: Diagnosis not present

## 2018-03-11 DIAGNOSIS — Z853 Personal history of malignant neoplasm of breast: Secondary | ICD-10-CM | POA: Diagnosis not present

## 2018-03-11 DIAGNOSIS — E1151 Type 2 diabetes mellitus with diabetic peripheral angiopathy without gangrene: Secondary | ICD-10-CM | POA: Diagnosis not present

## 2018-03-11 NOTE — Progress Notes (Signed)
Stacie Mcdonald, Anette T. ([id number]) Visit Report for 03/04/2018 Chief Complaint Document Details Patient Name: Stacie Mcdonald, Stacie T. Date of Service: 03/04/2018 2:00 PM Medical Record Number: [id number] Patient Account Number: [id number] Date of Birth/Sex: 13-Aug-1946 (72 y.o. F) Treating RN: Montey Hora Primary Care Provider: Deborra Medina Other Clinician: Referring Provider: Deborra Medina Treating Provider/Extender: Melburn Hake, Jshawn Hurta Weeks in Treatment: 5 Information Obtained from: Patient Chief Complaint Left breast burn Electronic Signature(s) Signed: 03/04/2018 5:53:05 PM By: Worthy Keeler PA-C Entered By: Worthy Keeler on 03/04/2018 14:01:00 Stacie Mcdonald, Stacie Mcdonald ([id number]) -------------------------------------------------------------------------------- Cellular or Tissue Based Product Details Patient Name: Stacie Mcdonald, Stacie T. Date of Service: 03/04/2018 2:00 PM Medical Record Number: [id number] Patient Account Number: [id number] Date of Birth/Sex: 1946/08/29 (72 y.o. F) Treating RN: Montey Hora Primary Care Provider: Deborra Medina Other Clinician: Referring Provider: Deborra Medina Treating Provider/Extender: Melburn Hake, Tasheem Elms Weeks in Treatment: 5 Cellular or Tissue Based Wound #1 Right Breast Product Type Applied to: Performed By: Physician STONE III, Jamar Casagrande E., PA-C Cellular or Tissue Based Other Product Type: Pre-procedure Verification/Time Yes - 14:35 Out Taken: Location: trunk / arms / legs Wound Size (sq cm): 16 Product Size (sq cm): 12 Waste Size (sq cm): 0 Amount of Product Applied (sq cm): 12 Instrument Used: Forceps, Scissors Lot #: [id number]; [id number] Expiration Date: 02/12/2023 Fenestrated: No Reconstituted: Yes Solution Type: NS Solution Amount: 3ML Lot #: [id number] Solution Expiration Date: 10/17/2019 Secured: Yes Secured With: Steri-Strips Dressing Applied: Yes Primary Dressing: mepitel one Procedural Pain: 0 Post Procedural Pain: 0 Response to Treatment: Procedure  was tolerated well Post Procedure Diagnosis Same as Pre-procedure Electronic Signature(s) Signed: 03/04/2018 3:07:03 PM By: Montey Hora Entered By: Montey Hora on 03/04/2018 14:37:47 Stacie Mcdonald, Stacie Mcdonald ([id number]) -------------------------------------------------------------------------------- HPI Details Patient Name: Stacie Mcdonald, Stacie T. Date of Service: 03/04/2018 2:00 PM Medical Record Number: [id number] Patient Account Number: [id number] Date of Birth/Sex: [birth year] (72 y.o. F) Treating RN: Montey Hora Primary Care Provider: Deborra Medina Other Clinician: Referring Provider: Deborra Medina Treating Provider/Extender: Melburn Hake, Soua Caltagirone Weeks in Treatment: 5 History of Present Illness HPI Description: 01/28/18 on evaluation today patient presents for initial evaluation concerning a burn to the right breast which occurred roughly 5 weeks ago according to the patient when she fell asleep due to her back pain with a heating pad on her back. Due to the lack of sensation in her breast she subsequently during the night rolled over onto the breast and this caused the burn that she is now been dealing with in the meantime. Unfortunately she still continue to have the back pain as well. According to her records it is stated that she has type II diabetes mellitus which is diet controlled and she's not checking her blood sugars at this point her most recent hemoglobin A1c with 6.5. On evaluation today she does have a eschar covering the majority the surface of the wound at this point and she does not seem to have any significant discomfort although she does have some pain. Patient also does have high blood pressure which is generally fairly well controlled. She was placed on doxycycline. However at this point she has completed that course of therapy. She does have a history of right breast mastectomy secondary to breast cancer which she had in 1991. She separately had a reconstruction surgery in  1993. She was also given Bactroban ointment by Dr. Derrel Nip who has been managing the burn up to this point. This was in conjunction with the doxycycline. Again fortunately patient does not seem to have  significant pain at this point. 02/04/18 on evaluation today patient appears to be doing rather well in regard to her right lateral breast ulcer. This is secondary to a burn which was thermal in nature. With that being said she seems to have a fairly good granular bed at this point does have some slight epithelialization around the edges of the wound although this is slowly healing it does appear to be some maceration noted as well. She does not seem to have any evidence of infection which is good news. 02/11/18 on evaluation today patient presents for follow-up concerning her right breast ulcer. She has been tolerating the dressing changes without complication. With that being said the wound bed does appear to be doing somewhat better and she does have a skin island and around the 9 o'clock location in regard to the wound. With that being said we have gotten her approved for NuShield. I explained how this could be beneficial for wound healing. She stated that after hearing the information about this she would like to proceed with application to see if this will aid in healing. Fortunately she has no evidence of infection. This is NuShield application #1. 6/0/45 patient presents today after her initial NuShield application last week. She has done very well in the interim since I last saw her. She did take the dressing off to get a shower this morning although she left the mepitel in place. For future I told her she can also remove this to carefully wash the area I do not see any problem with that. With that being said I think it may actually do better as the wound appear to be somewhat macerated I think due to the fact that he got wet doing her shower today. Otherwise she still has excellent response to  the NuShield application she has been tolerating the dressings without complication whatsoever. Patient has no pain. She is here for NuShield application #2 02/23/80 on evaluation today patient is status post having had her second NuShield application which she seems to have done very well with at this point. She has evidence of great epithelialization and this one seems to be progressing very nicely. She is actually here for NuShield application #3. 1/91/47 on evaluation today patient has been doing very well with her NuShield applications. She does tell me that she had a little bit more drainage and even a little bleeding over the past week. She in fact the [facility] on Tuesday to inquire about this. The good news is she seems to actually have done very well as far as the wound is concerned and we are definitely seeing improvement week that week. She is here today for NuShield application #4. Electronic Signature(s) Signed: 03/04/2018 5:53:05 PM By: Worthy Keeler PA-C Entered By: Worthy Keeler on 03/04/2018 17:13:17 Stacie Mcdonald, Stacie Mcdonald ([id number]) -------------------------------------------------------------------------------- Physical Exam Details Patient Name: Stacie Mcdonald, Stacie T. Date of Service: 03/04/2018 2:00 PM Medical Record Number: [id number] Patient Account Number: [id number] Date of Birth/Sex: 1946/03/11 (72 y.o. F) Treating RN: Montey Hora Primary Care Provider: Deborra Medina Other Clinician: Referring Provider: Deborra Medina Treating Provider/Extender: Melburn Hake, Daeshawn Redmann Weeks in Treatment: 5 Constitutional Well-nourished and well-hydrated in no acute distress. Respiratory normal breathing without difficulty. Psychiatric this patient is able to make decisions and demonstrates good insight into disease process. Alert and Oriented x 3. pleasant and cooperative. Notes On inspection patient's wound did not require debridement today she had an excellent surface to the wound and  overall I was very  pleased with how she has progressed since last week even. There is a lot of great epithelialization noted at this point. Electronic Signature(s) Signed: 03/04/2018 5:53:05 PM By: Worthy Keeler PA-C Entered By: Worthy Keeler on 03/04/2018 17:13:54 Stacie Mcdonald, Stacie Mcdonald ([id number]) -------------------------------------------------------------------------------- Physician Orders Details Patient Name: Stacie Mcdonald, Stacie T. Date of Service: 03/04/2018 2:00 PM Medical Record Number: [id number] Patient Account Number: [id number] Date of Birth/Sex: May 14, 1946 (72 y.o. F) Treating RN: Montey Hora Primary Care Provider: Deborra Medina Other Clinician: Referring Provider: Deborra Medina Treating Provider/Extender: Melburn Hake, Alexzandria Massman Weeks in Treatment: 5 Verbal / Phone Orders: No Diagnosis Coding ICD-10 Coding Code Description T31.0 Burns involving less than 10% of body surface L98.492 Non-pressure chronic ulcer of skin of other sites with fat layer exposed I10 Essential (primary) hypertension Wound Cleansing Wound #1 Right Breast o Clean wound with Normal Saline. o May Shower, gently pat wound dry prior to applying new dressing. Anesthetic (add to Medication List) Wound #1 Right Breast o Topical Lidocaine 4% cream applied to wound bed prior to debridement (In Clinic Only). Skin Barriers/Peri-Wound Care Wound #1 Right Breast o Skin Prep Primary Wound Dressing Wound #1 Right Breast o Mepitel One Contact layer o Drawtex o Other: - Nusheild applied today Secondary Dressing Wound #1 Right Breast o Boardered Foam Dressing Dressing Change Frequency Wound #1 Right Breast o Change dressing every week - may change outter bandage as needed for drainage Follow-up Appointments Wound #1 Right Breast o Return Appointment in 1 week. Additional Orders / Instructions Wound #1 Right Breast o Vitamin A; Vitamin C, Zinc - Please add a multivitamin with 100% of these  vitamins and minerals o Increase protein intake. Stacie Mcdonald, Stacie T. ([id number]) Notes Nusheild applied in clinic; including contact layer, fixation with steri strips, drawtex and cover bandage Electronic Signature(s) Signed: 03/04/2018 3:07:03 PM By: Montey Hora Signed: 03/04/2018 5:53:05 PM By: Worthy Keeler PA-C Entered By: Montey Hora on 03/04/2018 14:40:02 Stacie Mcdonald, Stacie Mcdonald ([id number]) -------------------------------------------------------------------------------- Problem List Details Patient Name: Stacie Mcdonald, Stacie T. Date of Service: 03/04/2018 2:00 PM Medical Record Number: [id number] Patient Account Number: [id number] Date of Birth/Sex: [birth year] (72 y.o. F) Treating RN: Montey Hora Primary Care Provider: Deborra Medina Other Clinician: Referring Provider: Deborra Medina Treating Provider/Extender: Melburn Hake, Ashtin Melichar Weeks in Treatment: 5 Active Problems ICD-10 Impacting Encounter Code Description Active Date Wound Healing Diagnosis T31.0 Burns involving less than 10% of body surface 01/28/2018 Yes L98.492 Non-pressure chronic ulcer of skin of other sites with fat layer 01/28/2018 Yes exposed [place] (primary) hypertension 01/28/2018 Yes Inactive Problems Resolved Problems Electronic Signature(s) Signed: 03/04/2018 5:53:05 PM By: Worthy Keeler PA-C Entered By: Worthy Keeler on 03/04/2018 14:00:55 Stacie Mcdonald, Stacie Mcdonald ([id number]) -------------------------------------------------------------------------------- Progress Note Details Patient Name: Stacie Mcdonald, Stacie T. Date of Service: 03/04/2018 2:00 PM Medical Record Number: [id number] Patient Account Number: [id number] Date of Birth/Sex: [birth year] (72 y.o. F) Treating RN: Montey Hora Primary Care Provider: Deborra Medina Other Clinician: Referring Provider: Deborra Medina Treating Provider/Extender: Melburn Hake, Ahmod Gillespie Weeks in Treatment: 5 Subjective Chief Complaint Information obtained from Patient Left breast  burn History of Present Illness (HPI) 01/28/18 on evaluation today patient presents for initial evaluation concerning a burn to the right breast which occurred roughly 5 weeks ago according to the patient when she fell asleep due to her back pain with a heating pad on her back. Due to the lack of sensation in her breast she subsequently during the night rolled over onto the breast and this caused the burn that she is  now been dealing with in the meantime. Unfortunately she still continue to have the back pain as well. According to her records it is stated that she has type II diabetes mellitus which is diet controlled and she's not checking her blood sugars at this point her most recent hemoglobin A1c with 6.5. On evaluation today she does have a eschar covering the majority the surface of the wound at this point and she does not seem to have any significant discomfort although she does have some pain. Patient also does have high blood pressure which is generally fairly well controlled. She was placed on doxycycline. However at this point she has completed that course of therapy. She does have a history of right breast mastectomy secondary to breast cancer which she had in 1991. She separately had a reconstruction surgery in 1993. She was also given Bactroban ointment by Dr. Derrel Nip who has been managing the burn up to this point. This was in conjunction with the doxycycline. Again fortunately patient does not seem to have significant pain at this point. 02/04/18 on evaluation today patient appears to be doing rather well in regard to her right lateral breast ulcer. This is secondary to a burn which was thermal in nature. With that being said she seems to have a fairly good granular bed at this point does have some slight epithelialization around the edges of the wound although this is slowly healing it does appear to be some maceration noted as well. She does not seem to have any evidence of infection  which is good news. 02/11/18 on evaluation today patient presents for follow-up concerning her right breast ulcer. She has been tolerating the dressing changes without complication. With that being said the wound bed does appear to be doing somewhat better and she does have a skin island and around the 9 o'clock location in regard to the wound. With that being said we have gotten her approved for NuShield. I explained how this could be beneficial for wound healing. She stated that after hearing the information about this she would like to proceed with application to see if this will aid in healing. Fortunately she has no evidence of infection. This is NuShield application #1. 01/15/34 patient presents today after her initial NuShield application last week. She has done very well in the interim since I last saw her. She did take the dressing off to get a shower this morning although she left the mepitel in place. For future I told her she can also remove this to carefully wash the area I do not see any problem with that. With that being said I think it may actually do better as the wound appear to be somewhat macerated I think due to the fact that he got wet doing her shower today. Otherwise she still has excellent response to the NuShield application she has been tolerating the dressings without complication whatsoever. Patient has no pain. She is here for NuShield application #2 5/73/22 on evaluation today patient is status post having had her second NuShield application which she seems to have done very well with at this point. She has evidence of great epithelialization and this one seems to be progressing very nicely. She is actually here for NuShield application #3. 0/25/42 on evaluation today patient has been doing very well with her NuShield applications. She does tell me that she had a little bit more drainage and even a little bleeding over the past week. She in fact the [facility] on Tuesday  to  inquire about this. The good news is she seems to actually have done very well as far as the wound is concerned and we are definitely seeing improvement week that week. She is here today for NuShield application #4. Stacie Mcdonald, Stacie T. ([id number]) Patient History Information obtained from Patient. Family History Cancer - Father, Diabetes - Siblings, Heart Disease - Mother,Father, Hypertension - Mother, Kidney Disease - Siblings, Lung Disease - Father, Stroke - Father, Thyroid Problems - Siblings, No family history of Seizures, Tuberculosis. Social History Never smoker, Marital Status - Married, Alcohol Use - Rarely, Drug Use - No History, Caffeine Use - Daily. 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 Respiratory The patient has no complaints or symptoms. Cardiovascular The patient has no complaints or symptoms. Psychiatric The patient has no complaints or symptoms. Objective Constitutional Well-nourished and well-hydrated in no acute distress. Vitals Time Taken: 1:58 PM, Height: 64 in, Weight: 183 lbs, BMI: 31.4, Temperature: 98.6 F, Pulse: 91 bpm, Respiratory Rate: 18 breaths/min, Blood Pressure: 144/75 mmHg. Respiratory normal breathing without difficulty. Psychiatric this patient is able to make decisions and demonstrates good insight into disease process. Alert and Oriented x 3. pleasant and cooperative. General Notes: On inspection patient's wound did not require debridement today she had an excellent surface to the wound and overall I was very pleased with how she has progressed since last week even. There is a lot of great epithelialization noted at this point. Integumentary (Hair, Skin) Wound #1 status is Open. Original cause of wound was Thermal Burn. The wound is located on the Right Breast. The wound measures  4cm length x 4cm width x 0.1cm depth; 12.566cm^2 area and 1.257cm^3 volume. There is Fat Layer (Subcutaneous Tissue) Exposed exposed. There is no tunneling or undermining noted. There is a medium amount of Stacie Mcdonald, Stacie T. ([id number]) serosanguineous drainage noted. The wound margin is distinct with the outline attached to the wound base. There is medium (34-66%) red granulation within the wound bed. There is a medium (34-66%) amount of necrotic tissue within the wound bed including Eschar and Adherent Slough. The periwound skin appearance exhibited: Dry/Scaly. The periwound skin appearance did not exhibit: Callus, Crepitus, Excoriation, Induration, Rash, Scarring, Maceration, Atrophie Blanche, Cyanosis, Ecchymosis, Hemosiderin Staining, Mottled, Pallor, Rubor, Erythema. Periwound temperature was noted as No Abnormality. 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 A skin graft procedure using a bioengineered skin substitute/cellular or tissue based product was performed by STONE III, Kiree Dejarnette E., PA-C with the following instrument(s): Forceps and Scissors. Other was applied and secured with Steri-Strips. 12 sq cm of product was utilized and 0 sq cm was wasted. Post Application, mepitel one was applied. A Time Out was conducted at 14:35, prior to the start of the procedure. The procedure was tolerated well with a pain level of 0 throughout and a pain level of 0 following the procedure. Post procedure Diagnosis Wound #1: Same as Pre-Procedure . Plan Wound Cleansing: Wound #1 Right Breast: Clean wound with Normal Saline. May Shower, gently pat wound dry prior to applying new dressing. Anesthetic (add to Medication List): Wound  #1 Right Breast: Topical Lidocaine 4% cream applied  to wound bed prior to debridement (In Clinic Only). Skin Barriers/Peri-Wound Care: Wound #1 Right Breast: Skin Prep Primary Wound Dressing: Wound #1 Right Breast: Mepitel One Contact layer Drawtex Other: - Nusheild applied today Secondary Dressing: Wound #1 Right Breast: Boardered Foam Dressing Stacie Mcdonald, Stacie T. ([id number]) Dressing Change Frequency: Wound #1 Right Breast: Change dressing every week - may change outter bandage as needed for drainage Follow-up Appointments: Wound #1 Right Breast: Return Appointment in 1 week. Additional Orders / Instructions: Wound #1 Right Breast: Vitamin A; Vitamin C, Zinc - Please add a multivitamin with 100% of these vitamins and minerals Increase protein intake. General Notes: Nusheild applied in clinic; including contact layer, fixation with steri strips, drawtex and cover bandage After applying the fourth NuShield today I am gonna suggest that we continue with the Current wound care measures for the next week she can change the outer dressing if need be but leave the inner dressing in place. Patient is in agreement with this plan. We will see were things stand in one weeks time we see her for reevaluation. Please see above for specific wound care orders. We will see patient for re-evaluation in 1 week(s) here in the clinic. If anything worsens or changes patient will contact our office for additional recommendations. Electronic Signature(s) Signed: 03/04/2018 5:53:05 PM By: Worthy Keeler PA-C Entered By: Worthy Keeler on 03/04/2018 17:14:33 Stacie Mcdonald, Stacie Mcdonald ([id number]) -------------------------------------------------------------------------------- ROS/PFSH Details Patient Name: Stacie Mcdonald, Stacie T. Date of Service: 03/04/2018 2:00 PM Medical Record Number: [id number] Patient Account Number: [id number] Date of Birth/Sex: 10-29-1946 (72 y.o. F) Treating RN: Montey Hora Primary Care  Provider: Deborra Medina Other Clinician: Referring Provider: Deborra Medina Treating Provider/Extender: Melburn Hake, Allysha Tryon Weeks in Treatment: 5 Information Obtained From Patient Wound History Do you currently have one or more open woundso Yes How many open wounds do you currently haveo 1 Approximately how long have you had your woundso 5 weeks How have you been treating your wound(s) until nowo bactroban Has your wound(s) ever healed and then re-openedo No Have you had any lab work done in the past montho No Have you tested positive for an antibiotic resistant organism (MRSA, VRE)o No Have you tested positive for osteomyelitis (bone infection)o No Have you had any tests for circulation on your legso No Constitutional Symptoms (General Health) Complaints and Symptoms: Negative for: Fever; Chills Eyes Medical History: Negative for: Cataracts; Glaucoma; Optic Neuritis Ear/Nose/Mouth/Throat Medical History: Negative for: Chronic sinus problems/congestion; Middle ear problems Hematologic/Lymphatic Medical History: Negative for: Anemia; Hemophilia; Human Immunodeficiency Virus; Lymphedema; Sickle Cell Disease Respiratory Complaints and Symptoms: No Complaints or Symptoms Medical History: Negative for: Aspiration; Asthma; Pneumothorax; Sleep Apnea; Tuberculosis Past Medical History Notes: 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 ([id number]) Negative for: Angina; Deep Vein Thrombosis; Hypotension; Peripheral Arterial Disease; Peripheral Venous Disease; Phlebitis; Vasculitis Gastrointestinal Medical History: Negative for: Cirrhosis ; Colitis; Crohnos; Hepatitis A; Hepatitis B; Hepatitis C Immunological Medical History: Negative for: Lupus Erythematosus; Raynaudos;  Scleroderma Integumentary (Skin) Medical History: Negative for: History of Burn; History of pressure wounds Musculoskeletal Medical History: Positive for: Osteoarthritis Negative for: Gout; Rheumatoid Arthritis; Osteomyelitis Neurologic Medical History: Negative for: Dementia; Neuropathy; Quadriplegia; Paraplegia; Seizure Disorder Oncologic Medical History: Positive for: Received Chemotherapy - 1991; Received Radiation - 1991 Psychiatric Complaints and Symptoms: No Complaints or Symptoms Immunizations Pneumococcal Vaccine: Received Pneumococcal Vaccination: Yes Implantable Devices Family and  Social

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
## 2018-03-17 NOTE — Progress Notes (Signed)
Stacie Mcdonald, Lasasha T. ([id number]) Visit Report for 03/11/2018 Chief Complaint Document Details Patient Name: Stacie Mcdonald, Stacie T. Date of Service: 03/11/2018 9:30 AM Medical Record Number: [id number] Patient Account Number: [id number] Date of Birth/Sex: [birth year] (72 y.o. F) Treating RN: Montey Hora Primary Care Provider: Deborra Medina Other Clinician: Referring Provider: Deborra Medina Treating Provider/Extender: Melburn Hake, HOYT Weeks in Treatment: 6 Information Obtained from: Patient Chief Complaint Left breast burn Electronic Signature(s) Signed: 03/11/2018 5:37:52 PM By: Worthy Keeler PA-C Entered By: Worthy Keeler on 03/11/2018 09:31:15 Stacie Mcdonald, Jenetta Downer ([id number]) -------------------------------------------------------------------------------- Cellular or Tissue Based Product Details Patient Name: Stacie Mcdonald, Stacie T. Date of Service: 03/11/2018 9:30 AM Medical Record Number: [id number] Patient Account Number: [id number] Date of Birth/Sex: 1945/12/29 (72 y.o. F) Treating RN: Montey Hora Primary Care Provider: Deborra Medina Other Clinician: Referring Provider: Deborra Medina Treating Provider/Extender: Melburn Hake, HOYT Weeks in Treatment: 6 Cellular or Tissue Based Wound #1 Right Breast Product Type Applied to: Performed By: Physician STONE III, HOYT E., PA-C Cellular or Tissue Based Other Product Type: Pre-procedure Verification/Time Yes - 10:48 Out Taken: Location: trunk / arms / legs Wound Size (sq cm): 21 Product Size (sq cm): 12 Waste Size (sq cm): 0 Amount of Product Applied (sq cm): 12 Instrument Used: Forceps, Scissors Lot #: [id number] Expiration Date: 02/18/2023 Fenestrated: No Reconstituted: Yes Solution Type: NS Solution Amount: 3ML Lot #: [id number] Solution Expiration Date: 11/17/2019 Secured: Yes Secured With: Steri-Strips Dressing Applied: Yes Primary Dressing: mepitel one Procedural Pain: 0 Post Procedural Pain: 0 Response to Treatment: Procedure was  tolerated well Post Procedure Diagnosis Same as Pre-procedure Electronic Signature(s) Signed: 03/11/2018 4:37:07 PM By: Montey Hora Entered By: Montey Hora on 03/11/2018 10:50:51 Stacie Mcdonald, Jenetta Downer ([id number]) -------------------------------------------------------------------------------- Debridement Details Patient Name: Stacie Mcdonald, Stacie T. Date of Service: 03/11/2018 9:30 AM Medical Record Number: [id number] Patient Account Number: [id number] Date of Birth/Sex: [birth year] (72 y.o. F) Treating RN: Montey Hora Primary Care Provider: Deborra Medina Other Clinician: Referring Provider: Deborra Medina Treating Provider/Extender: Melburn Hake, HOYT Weeks in Treatment: 6 Debridement Performed for Wound #1 Right Breast Assessment: Performed By: Physician STONE III, HOYT E., PA-C Debridement Type: Debridement Pre-procedure Verification/Time Yes - 10:44 Out Taken: Start Time: 10:44 Pain Control: Lidocaine 4% Topical Solution Total Area Debrided (L x W): 2 (cm) x 4 (cm) = 8 (cm) Tissue and other material Viable, Non-Viable, Slough, Subcutaneous, Fibrin/Exudate, Slough debrided: Level: Skin/Subcutaneous Tissue Debridement Description: Excisional Instrument: Curette Bleeding: Minimum Hemostasis Achieved: Pressure End Time: 10:46 Procedural Pain: 0 Post Procedural Pain: 0 Response to Treatment: Procedure was tolerated well Post Debridement Measurements of Total Wound Length: (cm) 5 Width: (cm) 4.2 Depth: (cm) 0.1 Volume: (cm) 1.649 Character of Wound/Ulcer Post Debridement: Improved Post Procedure Diagnosis Same as Pre-procedure Electronic Signature(s) Signed: 03/11/2018 4:37:07 PM By: Montey Hora Signed: 03/11/2018 5:37:52 PM By: Worthy Keeler PA-C Entered By: Montey Hora on 03/11/2018 10:47:32 Stacie Mcdonald, Jenetta Downer ([id number]) -------------------------------------------------------------------------------- HPI Details Patient Name: Stacie Mcdonald, Stacie T. Date of Service:  03/11/2018 9:30 AM Medical Record Number: [id number] Patient Account Number: [id number] Date of Birth/Sex: Dec 23, 1945 (72 y.o. F) Treating RN: Montey Hora Primary Care Provider: Deborra Medina Other Clinician: Referring Provider: Deborra Medina Treating Provider/Extender: Melburn Hake, HOYT Weeks in Treatment: 6 History of Present Illness HPI Description: 01/28/18 on evaluation today patient presents for initial evaluation concerning a burn to the right breast which occurred roughly 5 weeks ago according to the patient when she fell asleep due to her back pain with a heating pad on her back. Due to the  lack of sensation in her breast she subsequently during the night rolled over onto the breast and this caused the burn that she is now been dealing with in the meantime. Unfortunately she still continue to have the back pain as well. According to her records it is stated that she has type II diabetes mellitus which is diet controlled and she's not checking her blood sugars at this point her most recent hemoglobin A1c with 6.5. On evaluation today she does have a eschar covering the majority the surface of the wound at this point and she does not seem to have any significant discomfort although she does have some pain. Patient also does have high blood pressure which is generally fairly well controlled. She was placed on doxycycline. However at this point she has completed that course of therapy. She does have a history of right breast mastectomy secondary to breast cancer which she had in 1991. She separately had a reconstruction surgery in 1993. She was also given Bactroban ointment by Dr. Derrel Nip who has been managing the burn up to this point. This was in conjunction with the doxycycline. Again fortunately patient does not seem to have significant pain at this point. 02/04/18 on evaluation today patient appears to be doing rather well in regard to her right lateral breast ulcer. This is secondary to a  burn which was thermal in nature. With that being said she seems to have a fairly good granular bed at this point does have some slight epithelialization around the edges of the wound although this is slowly healing it does appear to be some maceration noted as well. She does not seem to have any evidence of infection which is good news. 02/11/18 on evaluation today patient presents for follow-up concerning her right breast ulcer. She has been tolerating the dressing changes without complication. With that being said the wound bed does appear to be doing somewhat better and she does have a skin island and around the 9 o'clock location in regard to the wound. With that being said we have gotten her approved for NuShield. I explained how this could be beneficial for wound healing. She stated that after hearing the information about this she would like to proceed with application to see if this will aid in healing. Fortunately she has no evidence of infection. This is NuShield application #1. 11/21/08 patient presents today after her initial NuShield application last week. She has done very well in the interim since I last saw her. She did take the dressing off to get a shower this morning although she left the mepitel in place. For future I told her she can also remove this to carefully wash the area I do not see any problem with that. With that being said I think it may actually do better as the wound appear to be somewhat macerated I think due to the fact that he got wet doing her shower today. Otherwise she still has excellent response to the NuShield application she has been tolerating the dressings without complication whatsoever. Patient has no pain. She is here for NuShield application #2 9/60/45 on evaluation today patient is status post having had her second NuShield application which she seems to have done very well with at this point. She has evidence of great epithelialization and this one  seems to be progressing very nicely. She is actually here for NuShield application #3. 02/23/80 on evaluation today patient has been doing very well with her NuShield applications. She does tell  me that she had a little bit more drainage and even a little bleeding over the past week. She in fact the [facility] on Tuesday to inquire about this. The good news is she seems to actually have done very well as far as the wound is concerned and we are definitely seeing improvement week that week. She is here today for NuShield application #4. 6/73/41 on evaluation today patient appears to be doing very well in regard to her right breast ulcer. She has been tolerating the NuShield applications without complication and there does not appear to be any evidence of infection she has excellent granulation tissue noted. Overall though the actual measurements do not show it the wound is decreasing in size. She is here for NuShield application #5 Stacie Mcdonald, Stacie T. ([id number]) Electronic Signature(s) Signed: 03/11/2018 5:37:52 PM By: Worthy Keeler PA-C Entered By: Worthy Keeler on 03/11/2018 17:22:01 Stacie Mcdonald, Jenetta Downer ([id number]) -------------------------------------------------------------------------------- Physical Exam Details Patient Name: Stacie Mcdonald, Stacie T. Date of Service: 03/11/2018 9:30 AM Medical Record Number: [id number] Patient Account Number: [id number] Date of Birth/Sex: 29-Oct-1946 (72 y.o. F) Treating RN: Montey Hora Primary Care Provider: Deborra Medina Other Clinician: Referring Provider: Deborra Medina Treating Provider/Extender: Melburn Hake, HOYT Weeks in Treatment: 6 Constitutional Well-nourished and well-hydrated in no acute distress. Respiratory normal breathing without difficulty. Psychiatric this patient is able to make decisions and demonstrates good insight into disease process. Alert and Oriented x 3. pleasant and cooperative. Notes Patient's wound has excellent granulation  tissue there was some biofilm on the surface along with a slight amount of slough which was sharply debrided away today without complication and the wound appear to be doing excellent post debridement. Hemostasis was achieved and then the fifth NuShield application was applied. Electronic Signature(s) Signed: 03/11/2018 5:37:52 PM By: Worthy Keeler PA-C Entered By: Worthy Keeler on 03/11/2018 17:22:45 Stacie Mcdonald, Jenetta Downer ([id number]) -------------------------------------------------------------------------------- Physician Orders Details Patient Name: Stacie Mcdonald, Aqua T. Date of Service: 03/11/2018 9:30 AM Medical Record Number: [id number] Patient Account Number: [id number] Date of Birth/Sex: Jul 31, 1946 (72 y.o. F) Treating RN: Montey Hora Primary Care Provider: Deborra Medina Other Clinician: Referring Provider: Deborra Medina Treating Provider/Extender: Melburn Hake, HOYT Weeks in Treatment: 6 Verbal / Phone Orders: No Diagnosis Coding ICD-10 Coding Code Description T31.0 Burns involving less than 10% of body surface L98.492 Non-pressure chronic ulcer of skin of other sites with fat layer exposed I10 Essential (primary) hypertension Wound Cleansing Wound #1 Right Breast o Clean wound with Normal Saline. o May Shower, gently pat wound dry prior to applying new dressing. Anesthetic (add to Medication List) Wound #1 Right Breast o Topical Lidocaine 4% cream applied to wound bed prior to debridement (In Clinic Only). Skin Barriers/Peri-Wound Care Wound #1 Right Breast o Skin Prep Primary Wound Dressing Wound #1 Right Breast o Mepitel One Contact layer o Drawtex o Other: - Nusheild applied today Secondary Dressing Wound #1 Right Breast o Boardered Foam Dressing Dressing Change Frequency Wound #1 Right Breast o Change dressing every week - may change outter bandage as needed for drainage Follow-up Appointments Wound #1 Right Breast o Return Appointment in 1  week. Additional Orders / Instructions Wound #1 Right Breast o Vitamin A; Vitamin C, Zinc - Please add a multivitamin with 100% of these vitamins and minerals o Increase protein intake. Stacie Mcdonald, Stacie T. ([id number]) Notes Nusheild applied in clinic; including contact layer, fixation with steri strips, drawtex and cover bandage Electronic Signature(s) Signed: 03/11/2018 4:37:07 PM By: Montey Hora Signed: 03/11/2018 5:37:52 PM By: Joaquim Lai  III, Hoyt PA-C Entered By: Montey Hora on 03/11/2018 10:53:37 Stacie Mcdonald, Jenetta Downer ([id number]) -------------------------------------------------------------------------------- Problem List Details Patient Name: Stacie Mcdonald, Zeriah T. Date of Service: 03/11/2018 9:30 AM Medical Record Number: [id number] Patient Account Number: [id number] Date of Birth/Sex: 1946-03-11 (72 y.o. F) Treating RN: Montey Hora Primary Care Provider: Deborra Medina Other Clinician: Referring Provider: Deborra Medina Treating Provider/Extender: Melburn Hake, HOYT Weeks in Treatment: 6 Active Problems ICD-10 Impacting Encounter Code Description Active Date Wound Healing Diagnosis T31.0 Burns involving less than 10% of body surface 01/28/2018 Yes L98.492 Non-pressure chronic ulcer of skin of other sites with fat layer 01/28/2018 Yes exposed [place] (primary) hypertension 01/28/2018 Yes Inactive Problems Resolved Problems Electronic Signature(s) Signed: 03/11/2018 5:37:52 PM By: Worthy Keeler PA-C Entered By: Worthy Keeler on 03/11/2018 09:31:08 Stacie Mcdonald, Jenetta Downer ([id number]) -------------------------------------------------------------------------------- Progress Note Details Patient Name: Stacie Mcdonald, Stacie T. Date of Service: 03/11/2018 9:30 AM Medical Record Number: [id number] Patient Account Number: [id number] Date of Birth/Sex: September 09, 1946 (72 y.o. F) Treating RN: Montey Hora Primary Care Provider: Deborra Medina Other Clinician: Referring Provider: Deborra Medina Treating Provider/Extender: Melburn Hake, HOYT Weeks in Treatment: 6 Subjective Chief Complaint Information obtained from Patient Left breast burn History of Present Illness (HPI) 01/28/18 on evaluation today patient presents for initial evaluation concerning a burn to the right breast which occurred roughly 5 weeks ago according to the patient when she fell asleep due to her back pain with a heating pad on her back. Due to the lack of sensation in her breast she subsequently during the night rolled over onto the breast and this caused the burn that she is now been dealing with in the meantime. Unfortunately she still continue to have the back pain as well. According to her records it is stated that she has type II diabetes mellitus which is diet controlled and she's not checking her blood sugars at this point her most recent hemoglobin A1c with 6.5. On evaluation today she does have a eschar covering the majority the surface of the wound at this point and she does not seem to have any significant discomfort although she does have some pain. Patient also does have high blood pressure which is generally fairly well controlled. She was placed on doxycycline. However at this point she has completed that course of therapy. She does have a history of right breast mastectomy secondary to breast cancer which she had in 1991. She separately had a reconstruction surgery in 1993. She was also given Bactroban ointment by Dr. Derrel Nip who has been managing the burn up to this point. This was in conjunction with the doxycycline. Again fortunately patient does not seem to have significant pain at this point. 02/04/18 on evaluation today patient appears to be doing rather well in regard to her right lateral breast ulcer. This is secondary to a burn which was thermal in nature. With that being said she seems to have a fairly good granular bed at this point does have some slight epithelialization around the  edges of the wound although this is slowly healing it does appear to be some maceration noted as well. She does not seem to have any evidence of infection which is good news. 02/11/18 on evaluation today patient presents for follow-up concerning her right breast ulcer. She has been tolerating the dressing changes without complication. With that being said the wound bed does appear to be doing somewhat better and she does have a skin island and around the 9 o'clock location in regard to the  wound. With that being said we have gotten her approved for NuShield. I explained how this could be beneficial for wound healing. She stated that after hearing the information about this she would like to proceed with application to see if this will aid in healing. Fortunately she has no evidence of infection. This is NuShield application #1. 11/21/08 patient presents today after her initial NuShield application last week. She has done very well in the interim since I last saw her. She did take the dressing off to get a shower this morning although she left the mepitel in place. For future I told her she can also remove this to carefully wash the area I do not see any problem with that. With that being said I think it may actually do better as the wound appear to be somewhat macerated I think due to the fact that he got wet doing her shower today. Otherwise she still has excellent response to the NuShield application she has been tolerating the dressings without complication whatsoever. Patient has no pain. She is here for NuShield application #2 9/60/45 on evaluation today patient is status post having had her second NuShield application which she seems to have done very well with at this point. She has evidence of great epithelialization and this one seems to be progressing very nicely. She is actually here for NuShield application #3. 02/23/80 on evaluation today patient has been doing very well with her NuShield  applications. She does tell me that she had a little bit more drainage and even a little bleeding over the past week. She in fact the [facility] on Tuesday to inquire about this. The good news is she seems to actually have done very well as far as the wound is concerned and we are definitely seeing improvement week that week. She is here today for NuShield application #4. Stacie Mcdonald, Adryana T. ([id number]) 03/11/18 on evaluation today patient appears to be doing very well in regard to her right breast ulcer. She has been tolerating the NuShield applications without complication and there does not appear to be any evidence of infection she has excellent granulation tissue noted. Overall though the actual measurements do not show it the wound is decreasing in size. She is here for NuShield application #5 Patient History Information obtained from Patient. Family History Cancer - Father, Diabetes - Siblings, Heart Disease - Mother,Father, Hypertension - Mother, Kidney Disease - Siblings, Lung Disease - Father, Stroke - Father, Thyroid Problems - Siblings, No family history of Seizures, Tuberculosis. Social History Never smoker, Marital Status - Married, Alcohol Use - Rarely, Drug Use - No History, Caffeine Use - Daily. 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 Respiratory The patient has no complaints or symptoms. Cardiovascular The patient has no complaints or symptoms. Psychiatric The patient has no complaints or symptoms. Objective Constitutional Well-nourished and well-hydrated in no acute distress. Vitals Time Taken: 10:02 AM, Height: 64 in, Weight: 183 lbs, BMI: 31.4, Temperature: 98.3 F, Pulse: 88 bpm, Respiratory Rate: 18 breaths/min, Blood Pressure: 136/72 mmHg. Respiratory normal breathing without difficulty. Psychiatric this  patient is able to make decisions and demonstrates good insight into disease process. Alert and Oriented x 3. pleasant and cooperative. General Notes: Patient's wound has excellent granulation tissue there was some biofilm on the surface along with a slight amount of slough which was sharply debrided away today without complication and the wound  appear to be doing excellent post debridement. Hemostasis was achieved and then the fifth NuShield application was applied. Stacie Mcdonald, Elesha T. ([id number]) Integumentary (Hair, Skin) Wound #1 status is Open. Original cause of wound was Thermal Burn. The wound is located on the Right Breast. The wound measures 5cm length x 4.2cm width x 0.1cm depth; 16.493cm^2 area and 1.649cm^3 volume. There is Fat Layer (Subcutaneous Tissue) Exposed exposed. There is no tunneling or undermining noted. There is a medium amount of serosanguineous drainage noted. The wound margin is distinct with the outline attached to the wound base. There is medium (34-66%) red granulation within the wound bed. There is a medium (34-66%) amount of necrotic tissue within the wound bed including Eschar and Adherent Slough. The periwound skin appearance exhibited: Dry/Scaly. The periwound skin appearance did not exhibit: Callus, Crepitus, Excoriation, Induration, Rash, Scarring, Maceration, Atrophie Blanche, Cyanosis, Ecchymosis, Hemosiderin Staining, Mottled, Pallor, Rubor, Erythema. Periwound temperature was noted as No Abnormality. 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 There was a Excisional Skin/Subcutaneous Tissue Debridement with a total area of 8 sq cm performed by STONE III, HOYT E., PA-C. With  the following instrument(s): Curette. to remove Viable and Non-Viable tissue/material Material removed includes Subcutaneous Tissue, and Slough, Fibrin/Exudate, and [place] after achieving pain control using Lidocaine 4% Topical Solution. No specimens were taken. A time out was conducted at 10:44, prior to the start of the procedure. A Minimum amount of bleeding was controlled with Pressure. The procedure was tolerated well with a pain level of 0 throughout and a pain level of 0 following the procedure. Post Debridement Measurements: 5cm length x 4.2cm width x 0.1cm depth; 1.649cm^3 volume. Character of Wound/Ulcer Post Debridement is improved. Post procedure Diagnosis Wound #1: Same as Pre-Procedure Pre-procedure diagnosis of Wound #1 is a 3rd degree Burn located on the Right Breast. A skin graft procedure using a bioengineered skin substitute/cellular or tissue based product was performed by STONE III, HOYT E., PA-C with the following instrument(s): Forceps and Scissors. Other was applied and secured with Steri-Strips. 12 sq cm of product was utilized and 0 sq cm was wasted. Post Application, mepitel one was applied. A Time Out was conducted at 10:48, prior to the start of the procedure. The procedure was tolerated well with a pain level of 0 throughout and a pain level of 0 following the procedure. Post procedure Diagnosis Wound #1: Same as Pre-Procedure . Plan Wound Cleansing: Wound #1 Right Breast: Stacie Mcdonald, Stacie T. ([id number]) Clean wound with Normal Saline. May Shower, gently pat wound dry prior to applying new dressing. Anesthetic (add to Medication List): Wound #1 Right Breast: Topical Lidocaine 4% cream applied to wound bed prior to debridement (In Clinic Only). Skin Barriers/Peri-Wound Care: Wound #1 Right Breast: Skin Prep Primary Wound Dressing: Wound #1 Right Breast: Mepitel One Contact layer Drawtex Other: - Nusheild applied today Secondary Dressing: Wound #1 Right  Breast: Boardered Foam Dressing Dressing Change Frequency: Wound #1 Right Breast: Change dressing every week - may change outter bandage as needed for drainage Follow-up Appointments: Wound #1 Right Breast: Return Appointment in 1 week. Additional Orders / Instructions: Wound #1 Right Breast: Vitamin A; Vitamin C, Zinc - Please add a multivitamin with 100% of these vitamins and minerals Increase  protein intake. General Notes: Nusheild applied in clinic; including contact layer, fixation with steri strips, drawtex and cover bandage I'm gonna recommend currently that we continue with the NuShield as this seems to be of benefit for her we will see her back for reevaluation in one weeks time for the sixth application.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

## 2018-03-18 ENCOUNTER — Encounter: Payer: Medicare Other | Attending: Physician Assistant | Admitting: Physician Assistant

## 2018-03-18 DIAGNOSIS — E119 Type 2 diabetes mellitus without complications: Secondary | ICD-10-CM | POA: Diagnosis not present

## 2018-03-18 DIAGNOSIS — Z9221 Personal history of antineoplastic chemotherapy: Secondary | ICD-10-CM | POA: Diagnosis not present

## 2018-03-18 DIAGNOSIS — I110 Hypertensive heart disease with heart failure: Secondary | ICD-10-CM | POA: Insufficient documentation

## 2018-03-18 DIAGNOSIS — E11622 Type 2 diabetes mellitus with other skin ulcer: Secondary | ICD-10-CM | POA: Diagnosis not present

## 2018-03-18 DIAGNOSIS — X16XXXA Contact with hot heating appliances, radiators and pipes, initial encounter: Secondary | ICD-10-CM | POA: Diagnosis not present

## 2018-03-18 DIAGNOSIS — L98492 Non-pressure chronic ulcer of skin of other sites with fat layer exposed: Secondary | ICD-10-CM | POA: Insufficient documentation

## 2018-03-18 DIAGNOSIS — I509 Heart failure, unspecified: Secondary | ICD-10-CM | POA: Insufficient documentation

## 2018-03-18 DIAGNOSIS — T2131XA Burn of third degree of chest wall, initial encounter: Secondary | ICD-10-CM | POA: Diagnosis not present

## 2018-03-18 DIAGNOSIS — Z923 Personal history of irradiation: Secondary | ICD-10-CM | POA: Insufficient documentation

## 2018-03-18 DIAGNOSIS — I252 Old myocardial infarction: Secondary | ICD-10-CM | POA: Insufficient documentation

## 2018-03-18 DIAGNOSIS — I2510 Atherosclerotic heart disease of native coronary artery without angina pectoris: Secondary | ICD-10-CM | POA: Diagnosis not present

## 2018-03-18 DIAGNOSIS — Z8249 Family history of ischemic heart disease and other diseases of the circulatory system: Secondary | ICD-10-CM | POA: Insufficient documentation

## 2018-03-18 DIAGNOSIS — Z9011 Acquired absence of right breast and nipple: Secondary | ICD-10-CM | POA: Diagnosis not present
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
## 2018-03-24 NOTE — Progress Notes (Signed)
Stacie Mcdonald, Freida T. ([id number]) Visit Report for 03/18/2018 Chief Complaint Document Details Patient Name: Stacie Mcdonald, Stacie T. Date of Service: 03/18/2018 10:15 AM Medical Record Number: [id number] Patient Account Number: [id number] Date of Birth/Sex: 09-Mar-1946 (72 y.o. F) Treating RN: Montey Hora Primary Care Provider: Deborra Medina Other Clinician: Referring Provider: Deborra Medina Treating Provider/Extender: Melburn Hake,  Weeks in Treatment: 7 Information Obtained from: Patient Chief Complaint Left breast burn Electronic Signature(s) Signed: 03/22/2018 5:56:33 PM By: Worthy Keeler PA-C Entered By: Worthy Keeler on 03/18/2018 10:18:39 Stacie Mcdonald, Stacie Mcdonald ([id number]) -------------------------------------------------------------------------------- Cellular or Tissue Based Product Details Patient Name: Stacie Mcdonald, Stacie T. Date of Service: 03/18/2018 10:15 AM Medical Record Number: [id number] Patient Account Number: [id number] Date of Birth/Sex: 1946/10/07 (72 y.o. F) Treating RN: Montey Hora Primary Care Provider: Deborra Medina Other Clinician: Referring Provider: Deborra Medina Treating Provider/Extender: Melburn Hake,  Weeks in Treatment: 7 Cellular or Tissue Based Wound #1 Right Breast Product Type Applied to: Performed By: Physician STONE III,  E., PA-C Cellular or Tissue Based Other Product Type: Pre-procedure Verification/Time Yes - 11:21 Out Taken: Location: trunk / arms / legs Wound Size (sq cm): 5.74 Product Size (sq cm): 12 Waste Size (sq cm): 6 Waste Reason: wound size Amount of Product Applied (sq cm): 6 Instrument Used: Forceps, Scissors Lot #: [id number]; [id number] Expiration Date: 02/21/2023 Fenestrated: No Reconstituted: Yes Solution Type: saline Solution Amount: 65ml Lot #: [id number] Solution Expiration Date: 10/17/2019 Secured: Yes Secured With: Steri-Strips Dressing Applied: Yes Primary Dressing: mepitel one Procedural Pain: 0 Post Procedural Pain:  0 Response to Treatment: Procedure was tolerated well Post Procedure Diagnosis Same as Pre-procedure Electronic Signature(s) Signed: 03/18/2018 4:48:08 PM By: Montey Hora Entered By: Montey Hora on 03/18/2018 11:25:20 Stacie Mcdonald, Stacie Mcdonald ([id number]) -------------------------------------------------------------------------------- Debridement Details Patient Name: Stacie Mcdonald, Stacie T. Date of Service: 03/18/2018 10:15 AM Medical Record Number: [id number] Patient Account Number: [id number] Date of Birth/Sex: 1946-09-01 (72 y.o. F) Treating RN: Montey Hora Primary Care Provider: Deborra Medina Other Clinician: Referring Provider: Deborra Medina Treating Provider/Extender: Melburn Hake,  Weeks in Treatment: 7 Debridement Performed for Wound #1 Right Breast Assessment: Performed By: Physician STONE III,  E., PA-C Debridement Type: Debridement Pre-procedure Verification/Time Yes - 11:17 Out Taken: Start Time: 11:17 Pain Control: Lidocaine 4% Topical Solution Total Area Debrided (L x W): 1.4 (cm) x 4.1 (cm) = 5.74 (cm) Tissue and other material Non-Viable, Biofilm debrided: Level: Non-Viable Tissue Debridement Description: Selective/Open Wound Instrument: Curette Bleeding: Minimum Hemostasis Achieved: Pressure End Time: 11:18 Procedural Pain: 0 Post Procedural Pain: 0 Response to Treatment: Procedure was tolerated well Post Debridement Measurements of Total Wound Length: (cm) 1.4 Width: (cm) 4.1 Depth: (cm) 0.1 Volume: (cm) 0.451 Character of Wound/Ulcer Post Debridement: Improved Post Procedure Diagnosis Same as Pre-procedure Electronic Signature(s) Signed: 03/18/2018 4:48:08 PM By: Montey Hora Signed: 03/22/2018 5:56:33 PM By: Worthy Keeler PA-C Entered By: Montey Hora on 03/18/2018 11:19:07 Stacie Mcdonald, Stacie Mcdonald ([id number]) -------------------------------------------------------------------------------- HPI Details Patient Name: Stacie Mcdonald, Stacie T. Date of  Service: 03/18/2018 10:15 AM Medical Record Number: [id number] Patient Account Number: [id number] Date of Birth/Sex: [birth year] (72 y.o. F) Treating RN: Montey Hora Primary Care Provider: Deborra Medina Other Clinician: Referring Provider: Deborra Medina Treating Provider/Extender: Melburn Hake,  Weeks in Treatment: 7 History of Present Illness HPI Description: 01/28/18 on evaluation today patient presents for initial evaluation concerning a burn to the right breast which occurred roughly 5 weeks ago according to the patient when she fell asleep due to her back pain with a heating pad on her back. Due  to the lack of sensation in her breast she subsequently during the night rolled over onto the breast and this caused the burn that she is now been dealing with in the meantime. Unfortunately she still continue to have the back pain as well. According to her records it is stated that she has type II diabetes mellitus which is diet controlled and she's not checking her blood sugars at this point her most recent hemoglobin A1c with 6.5. On evaluation today she does have a eschar covering the majority the surface of the wound at this point and she does not seem to have any significant discomfort although she does have some pain. Patient also does have high blood pressure which is generally fairly well controlled. She was placed on doxycycline. However at this point she has completed that course of therapy. She does have a history of right breast mastectomy secondary to breast cancer which she had in 1991. She separately had a reconstruction surgery in 1993. She was also given Bactroban ointment by Dr. Derrel Nip who has been managing the burn up to this point. This was in conjunction with the doxycycline. Again fortunately patient does not seem to have significant pain at this point. 02/04/18 on evaluation today patient appears to be doing rather well in regard to her right lateral breast ulcer. This  is secondary to a burn which was thermal in nature. With that being said she seems to have a fairly good granular bed at this point does have some slight epithelialization around the edges of the wound although this is slowly healing it does appear to be some maceration noted as well. She does not seem to have any evidence of infection which is good news. 02/11/18 on evaluation today patient presents for follow-up concerning her right breast ulcer. She has been tolerating the dressing changes without complication. With that being said the wound bed does appear to be doing somewhat better and she does have a skin island and around the 9 o'clock location in regard to the wound. With that being said we have gotten her approved for NuShield. I explained how this could be beneficial for wound healing. She stated that after hearing the information about this she would like to proceed with application to see if this will aid in healing. Fortunately she has no evidence of infection. This is NuShield application #1. 12/21/93 patient presents today after her initial NuShield application last week. She has done very well in the interim since I last saw her. She did take the dressing off to get a shower this morning although she left the mepitel in place. For future I told her she can also remove this to carefully wash the area I do not see any problem with that. With that being said I think it may actually do better as the wound appear to be somewhat macerated I think due to the fact that he got wet doing her shower today. Otherwise she still has excellent response to the NuShield application she has been tolerating the dressings without complication whatsoever. Patient has no pain. She is here for NuShield application #2 6/38/75 on evaluation today patient is status post having had her second NuShield application which she seems to have done very well with at this point. She has evidence of great  epithelialization and this one seems to be progressing very nicely. She is actually here for NuShield application #3. 6/43/32 on evaluation today patient has been doing very well with her NuShield applications. She  does tell me that she had a little bit more drainage and even a little bleeding over the past week. She in fact the [facility] on Tuesday to inquire about this. The good news is she seems to actually have done very well as far as the wound is concerned and we are definitely seeing improvement week that week. She is here today for NuShield application #4. 2/70/62 on evaluation today patient appears to be doing very well in regard to her right breast ulcer. She has been tolerating the NuShield applications without complication and there does not appear to be any evidence of infection she has excellent granulation tissue noted. Overall though the actual measurements do not show it the wound is decreasing in size. She is here for NuShield application #5 01/20/61 on evaluation today the patient appears to be doing excellent in regard to her right lateral breast ulcer. She has been Stacie Mcdonald, Stacie T. ([id number]) tolerating the dressing changes without complication specifically the NuShield applications. She states that she only had to change the dressing once during the week this go around. The good news is this one has made excellent progress and is very close to complete closure at this time. She is having no pain. 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 Date of Service: 03/18/2018 10:15 AM Medical Record Number: [id number] Patient Account Number: [id number] Date of Birth/Sex: [birth year] (72 y.o. F) Treating RN: Montey Hora Primary Care Provider: Deborra Medina Other Clinician: Referring Provider: Deborra Medina Treating Provider/Extender: Melburn Hake,  Weeks in Treatment: 7 Constitutional Well-nourished and well-hydrated in no acute distress. Respiratory normal breathing without difficulty. Psychiatric this patient is able to make decisions and demonstrates good insight into disease process. Alert and Oriented x 3. pleasant and cooperative. Notes Currently the one that was prepped for the NuShield by performing a gentle debridement removing biofilm at this point. She tolerated this without any complication whatsoever. There was a little bit of bleeding and post debridement overall this seemed to be a very clean one surface ready for NuShield application. I then did apply the NuShield to the world which she tolerated without any complications this was covered with a mepitel and then secured with Steri-Strips. Electronic Signature(s) Signed: 03/22/2018 5:56:33 PM By: Worthy Keeler PA-C Entered By: Worthy Keeler on 03/18/2018 13:18:01 Stacie Mcdonald, Stacie Mcdonald ([id number]) -------------------------------------------------------------------------------- Physician Orders Details Patient Name: Stacie Mcdonald, Stacie T. Date of Service: 03/18/2018 10:15 AM Medical Record Number: [id number] Patient Account Number: [id number] Date of Birth/Sex: Nov 23, 1945 (72 y.o. F) Treating RN: Montey Hora Primary Care Provider: Deborra Medina Other Clinician: Referring Provider: Deborra Medina Treating Provider/Extender: Melburn Hake,  Weeks in Treatment: 7 Verbal / Phone Orders: No Diagnosis Coding ICD-10 Coding Code Description T31.0 Burns involving less than 10% of body surface L98.492 Non-pressure chronic ulcer of skin of other sites with fat layer exposed I10 Essential (primary) hypertension Wound Cleansing Wound #1 Right Breast o Clean wound with Normal Saline. o May Shower, gently pat wound dry prior to applying  new dressing. Anesthetic (add to Medication List) Wound #1 Right Breast o Topical Lidocaine 4% cream applied to wound bed prior to debridement (In Clinic Only). Skin Barriers/Peri-Wound Care Wound #1 Right Breast o Skin Prep Primary Wound Dressing Wound #1 Right Breast o Mepitel One Contact layer o Drawtex o Other: - Nusheild applied today Secondary Dressing  Wound #1 Right Breast o Boardered Foam Dressing Dressing Change Frequency Wound #1 Right Breast o Change dressing every week - may change outter bandage as needed for drainage Follow-up Appointments Wound #1 Right Breast o Return Appointment in 1 week. Additional Orders / Instructions Wound #1 Right Breast o Vitamin A; Vitamin C, Zinc - Please add a multivitamin with 100% of these vitamins and minerals o Increase protein intake. Stacie Mcdonald, Stacie T. ([id number]) Notes Nusheild applied in clinic; including contact layer, fixation with steri strips, drawtex and cover bandage Electronic Signature(s) Signed: 03/18/2018 4:48:08 PM By: Montey Hora Signed: 03/22/2018 5:56:33 PM By: Worthy Keeler PA-C Entered By: Montey Hora on 03/18/2018 11:26:22 Stacie Mcdonald, Stacie T. ([id number]) -------------------------------------------------------------------------------- Problem List Details Patient Name: Stacie Mcdonald, Stacie T. Date of Service: 03/18/2018 10:15 AM Medical Record Number: [id number] Patient Account Number: [id number] Date of Birth/Sex: May 12, 1946 (72 y.o. F) Treating RN: Montey Hora Primary Care Provider: Deborra Medina Other Clinician: Referring Provider: Deborra Medina Treating Provider/Extender: Melburn Hake,  Weeks in Treatment: 7 Active Problems ICD-10 Impacting Encounter Code Description Active Date Wound Healing Diagnosis T31.0 Burns involving less than 10% of body surface 01/28/2018 Yes L98.492 Non-pressure chronic ulcer of skin of other sites with fat layer 01/28/2018 Yes exposed [place]  (primary) hypertension 01/28/2018 Yes Inactive Problems Resolved Problems Electronic Signature(s) Signed: 03/22/2018 5:56:33 PM By: Worthy Keeler PA-C Entered By: Worthy Keeler on 03/18/2018 10:18:31 Stacie Mcdonald, Stacie Mcdonald ([id number]) -------------------------------------------------------------------------------- Progress Note Details Patient Name: Stacie Mcdonald, Stacie T. Date of Service: 03/18/2018 10:15 AM Medical Record Number: [id number] Patient Account Number: [id number] Date of Birth/Sex: September 01, 1946 (72 y.o. F) Treating RN: Montey Hora Primary Care Provider: Deborra Medina Other Clinician: Referring Provider: Deborra Medina Treating Provider/Extender: Melburn Hake,  Weeks in Treatment: 7 Subjective Chief Complaint Information obtained from Patient Left breast burn History of Present Illness (HPI) 01/28/18 on evaluation today patient presents for initial evaluation concerning a burn to the right breast which occurred roughly 5 weeks ago according to the patient when she fell asleep due to her back pain with a heating pad on her back. Due to the lack of sensation in her breast she subsequently during the night rolled over onto the breast and this caused the burn that she is now been dealing with in the meantime. Unfortunately she still continue to have the back pain as well. According to her records it is stated that she has type II diabetes mellitus which is diet controlled and she's not checking her blood sugars at this point her most recent hemoglobin A1c with 6.5. On evaluation today she does have a eschar covering the majority the surface of the wound at this point and she does not seem to have any significant discomfort although she does have some pain. Patient also does have high blood pressure which is generally fairly well controlled. She was placed on doxycycline. However at this point she has completed that course of therapy. She does have a history of right breast  mastectomy secondary to breast cancer which she had in 1991. She separately had a reconstruction surgery in 1993. She was also given Bactroban ointment by Dr. Derrel Nip who has been managing the burn up to this point. This was in conjunction with the doxycycline. Again fortunately patient does not seem to have significant pain at this point. 02/04/18 on evaluation today patient appears to be doing rather well in regard to her right lateral breast ulcer. This is secondary to a burn which was thermal in nature. With that being  said she seems to have a fairly good granular bed at this point does have some slight epithelialization around the edges of the wound although this is slowly healing it does appear to be some maceration noted as well. She does not seem to have any evidence of infection which is good news. 02/11/18 on evaluation today patient presents for follow-up concerning her right breast ulcer. She has been tolerating the dressing changes without complication. With that being said the wound bed does appear to be doing somewhat better and she does have a skin island and around the 9 o'clock location in regard to the wound. With that being said we have gotten her approved for NuShield. I explained how this could be beneficial for wound healing. She stated that after hearing the information about this she would like to proceed with application to see if this will aid in healing. Fortunately she has no evidence of infection. This is NuShield application #1. 12/19/27 patient presents today after her initial NuShield application last week. She has done very well in the interim since I last saw her. She did take the dressing off to get a shower this morning although she left the mepitel in place. For future I told her she can also remove this to carefully wash the area I do not see any problem with that. With that being said I think it may actually do better as the wound appear to be somewhat macerated I  think due to the fact that he got wet doing her shower today. Otherwise she still has excellent response to the NuShield application she has been tolerating the dressings without complication whatsoever. Patient has no pain. She is here for NuShield application #2 7/98/92 on evaluation today patient is status post having had her second NuShield application which she seems to have done very well with at this point. She has evidence of great epithelialization and this one seems to be progressing very nicely. She is actually here for NuShield application #3. 12/05/39 on evaluation today patient has been doing very well with her NuShield applications. She does tell me that she had a little bit more drainage and even a little bleeding over the past week. She in fact the [facility] on Tuesday to inquire about this. The good news is she seems to actually have done very well as far as the wound is concerned and we are definitely seeing improvement week that week. She is here today for NuShield application #4. Stacie Mcdonald, Stacie T. ([id number]) 03/11/18 on evaluation today patient appears to be doing very well in regard to her right breast ulcer. She has been tolerating the NuShield applications without complication and there does not appear to be any evidence of infection she has excellent granulation tissue noted. Overall though the actual measurements do not show it the wound is decreasing in size. She is here for NuShield application #5 06/20/62 on evaluation today the patient appears to be doing excellent in regard to her right lateral breast ulcer. She has been tolerating the dressing changes without complication specifically the NuShield applications. She states that she only had to change the dressing once during the week this go around. The good news is this one has made excellent progress and is very close to complete closure at this time. She is having no pain. NuShield application #6 Patient  History Information obtained from Patient. Family History Cancer - Father, Diabetes - Siblings, Heart Disease - Mother,Father, Hypertension - Mother, Kidney Disease - Siblings, Lung Disease -  Father, Stroke - Father, Thyroid Problems - Siblings, No family history of Seizures, Tuberculosis. Social History Never smoker, Marital Status - Married, Alcohol Use - Rarely, Drug Use - No History, Caffeine Use - Daily. 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 Respiratory The patient has no complaints or symptoms. Cardiovascular The patient has no complaints or symptoms. Psychiatric The patient has no complaints or symptoms. Objective Constitutional Well-nourished and well-hydrated in no acute distress. Vitals Time Taken: 10:34 AM, Height: 64 in, Weight: 183 lbs, BMI: 31.4, Temperature: 98.2 F, Pulse: 75 bpm, Respiratory Rate: 18 breaths/min, Blood Pressure: 124/54 mmHg. Respiratory normal breathing without difficulty. Psychiatric this patient is able to make decisions and demonstrates good insight into disease process. Alert and Oriented x 3. pleasant and cooperative. Stacie Mcdonald, Stacie T. ([id number]) General Notes: Currently the one that was prepped for the NuShield by performing a gentle debridement removing biofilm at this point. She tolerated this without any complication whatsoever. There was a little bit of bleeding and post debridement overall this seemed to be a very clean one surface ready for NuShield application. I then did apply the NuShield to the world which she tolerated without any complications this was covered with a mepitel and then secured with Steri-Strips. Integumentary (Hair, Skin) Wound #1 status is Open. Original cause of wound was Thermal Burn. The wound is located on the Right Breast. The wound measures  1.4cm length x 4.1cm width x 0.1cm depth; 4.508cm^2 area and 0.451cm^3 volume. There is Fat Layer (Subcutaneous Tissue) Exposed exposed. There is no tunneling or undermining noted. There is a large amount of serosanguineous drainage noted. The wound margin is distinct with the outline attached to the wound base. There is large (67-100%) red granulation within the wound bed. There is a small (1-33%) amount of necrotic tissue within the wound bed including Adherent Slough. The periwound skin appearance exhibited: Dry/Scaly. The periwound skin appearance did not exhibit: Callus, Crepitus, Excoriation, Induration, Rash, Scarring, Maceration, Atrophie Blanche, Cyanosis, Ecchymosis, Hemosiderin Staining, Mottled, Pallor, Rubor, Erythema. Periwound temperature was noted as No Abnormality. 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 There was a Selective/Open Wound Non-Viable Tissue Debridement with a total area of 5.74 sq cm performed by STONE III,  E., PA-C. With the following instrument(s): Curette. to remove Non-Viable tissue/material Material removed includes Biofilm after achieving pain control using Lidocaine 4% Topical Solution. No specimens were taken. A time out was conducted at 11:17, prior to the start of the procedure. A Minimum amount of bleeding was controlled with Pressure. The procedure was tolerated well with a pain level of 0 throughout and a pain level of 0 following the procedure. Post Debridement Measurements: 1.4cm length x 4.1cm width x 0.1cm depth; 0.451cm^3 volume. Character of Wound/Ulcer Post Debridement is improved. Post procedure Diagnosis Wound #1: Same as Pre-Procedure Pre-procedure diagnosis of  Wound #1 is a 3rd degree Burn located on the Right Breast. A skin graft procedure using a bioengineered skin substitute/cellular or tissue based product was performed by STONE III,  E., PA-C with the following instrument(s): Forceps and Scissors. Other was applied and secured with Steri-Strips. 6 sq cm of product  was utilized and 6 sq cm was wasted due to wound size. Post Application, mepitel one was applied. A Time Out was conducted at 11:21, prior to the start of the procedure. The procedure was tolerated well with a pain level of 0 throughout and a pain level of 0 following the procedure. Post procedure Diagnosis Wound #1: Same as Pre-Procedure . Stacie Mcdonald, Almedia T. ([id number]) Plan Wound Cleansing: Wound #1 Right Breast: Clean wound with Normal Saline. May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

## 2018-03-25 ENCOUNTER — Encounter: Payer: Medicare Other | Admitting: Physician Assistant

## 2018-03-25 DIAGNOSIS — L98492 Non-pressure chronic ulcer of skin of other sites with fat layer exposed: Secondary | ICD-10-CM | POA: Diagnosis not present

## 2018-03-25 DIAGNOSIS — T2131XA Burn of third degree of chest wall, initial encounter: Secondary | ICD-10-CM | POA: Diagnosis not present

## 2018-03-25 DIAGNOSIS — E11622 Type 2 diabetes mellitus with other skin ulcer: Secondary | ICD-10-CM | POA: Diagnosis not present

## 2018-03-25 DIAGNOSIS — I110 Hypertensive heart disease with heart failure: Secondary | ICD-10-CM | POA: Diagnosis not present

## 2018-03-25 DIAGNOSIS — I252 Old myocardial infarction: Secondary | ICD-10-CM | POA: Diagnosis not present

## 2018-03-25 DIAGNOSIS — I509 Heart failure, unspecified: Secondary | ICD-10-CM | POA: Diagnosis not present
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
## 2018-03-29 DIAGNOSIS — I2510 Atherosclerotic heart disease of native coronary artery without angina pectoris: Secondary | ICD-10-CM | POA: Diagnosis not present

## 2018-03-29 DIAGNOSIS — K219 Gastro-esophageal reflux disease without esophagitis: Secondary | ICD-10-CM | POA: Diagnosis not present

## 2018-03-29 DIAGNOSIS — Z853 Personal history of malignant neoplasm of breast: Secondary | ICD-10-CM | POA: Diagnosis not present

## 2018-03-29 DIAGNOSIS — I509 Heart failure, unspecified: Secondary | ICD-10-CM | POA: Diagnosis not present

## 2018-03-29 DIAGNOSIS — I5042 Chronic combined systolic (congestive) and diastolic (congestive) heart failure: Secondary | ICD-10-CM | POA: Diagnosis not present

## 2018-03-29 DIAGNOSIS — R002 Palpitations: Secondary | ICD-10-CM | POA: Diagnosis not present

## 2018-03-29 DIAGNOSIS — J449 Chronic obstructive pulmonary disease, unspecified: Secondary | ICD-10-CM | POA: Diagnosis not present

## 2018-03-29 DIAGNOSIS — E782 Mixed hyperlipidemia: Secondary | ICD-10-CM | POA: Diagnosis not present

## 2018-03-29 DIAGNOSIS — R000 Tachycardia, unspecified: Secondary | ICD-10-CM | POA: Diagnosis not present

## 2018-03-29 DIAGNOSIS — I209 Angina pectoris, unspecified: Secondary | ICD-10-CM | POA: Diagnosis not present

## 2018-03-29 DIAGNOSIS — I10 Essential (primary) hypertension: Secondary | ICD-10-CM | POA: Diagnosis not present

## 2018-03-29 DIAGNOSIS — R0602 Shortness of breath: Secondary | ICD-10-CM | POA: Diagnosis not present

## 2018-03-31 NOTE — Progress Notes (Signed)
Stacie Mcdonald, Nisa T. ([id number]) Visit Report for 03/25/2018 Chief Complaint Document Details Patient Name: Stacie Mcdonald, Stacie T. Date of Service: 03/25/2018 10:30 AM Medical Record Number: [id number] Patient Account Number: [id number] Date of Birth/Sex: 1946-08-10 (72 y.o. F) Treating RN: Montey Hora Primary Care Provider: Deborra Medina Other Clinician: Referring Provider: Deborra Medina Treating Provider/Extender: Melburn Hake, HOYT Weeks in Treatment: 8 Information Obtained from: Patient Chief Complaint Left breast burn Electronic Signature(s) Signed: 03/26/2018 10:27:37 AM By: Worthy Keeler PA-C Entered By: Worthy Keeler on 03/25/2018 10:55:11 Stacie Mcdonald, Stacie Mcdonald ([id number]) -------------------------------------------------------------------------------- Cellular or Tissue Based Product Details Patient Name: Stacie Mcdonald, Stacie T. Date of Service: 03/25/2018 10:30 AM Medical Record Number: [id number] Patient Account Number: [id number] Date of Birth/Sex: [birth year] (72 y.o. F) Treating RN: Montey Hora Primary Care Provider: Deborra Medina Other Clinician: Referring Provider: Deborra Medina Treating Provider/Extender: Melburn Hake, HOYT Weeks in Treatment: 8 Cellular or Tissue Based Wound #1 Right Breast Product Type Applied to: Performed By: Physician STONE III, HOYT E., PA-C Cellular or Tissue Based Other Product Type: Pre-procedure Verification/Time Yes - 11:21 Out Taken: Location: trunk / arms / legs Wound Size (sq cm): 3 Product Size (sq cm): 8 Waste Size (sq cm): 5 Waste Reason: wound size Amount of Product Applied (sq cm): 3 Instrument Used: Forceps, Scissors Lot #: [id number] Expiration Date: 03/08/2023 Fenestrated: No Reconstituted: Yes Solution Type: NS Solution Amount: 3ML Lot #: [id number] Solution Expiration Date: 12/18/2019 Secured: Yes Secured With: Steri-Strips Dressing Applied: Yes Primary Dressing: mepitel one Procedural Pain: 0 Post Procedural Pain: 0 Response  to Treatment: Procedure was tolerated well Level of Consciousness: Awake and Alert Post Procedure Vitals: Temperature: 98.6 Pulse: 91 Respiratory Rate: 18 Blood Pressure: Systolic Blood Pressure: 027 Diastolic Blood Pressure: 63 Post Procedure Diagnosis Same as Pre-procedure Electronic Signature(s) Signed: 03/25/2018 4:42:41 PM By: Dorthy, Joanna Stacie Mcdonald, Stacie Mcdonald ([id number]) Entered By: Montey Hora on 03/25/2018 11:24:06 Stacie Mcdonald, Stacie Mcdonald ([id number]) -------------------------------------------------------------------------------- HPI Details Patient Name: Stacie Mcdonald, Stacie T. Date of Service: 03/25/2018 10:30 AM Medical Record Number: [id number] Patient Account Number: [id number] Date of Birth/Sex: [birth year] (72 y.o. F) Treating RN: Montey Hora Primary Care Provider: Deborra Medina Other Clinician: Referring Provider: Deborra Medina Treating Provider/Extender: Melburn Hake, HOYT Weeks in Treatment: 8 History of Present Illness HPI Description: 01/28/18 on evaluation today patient presents for initial evaluation concerning a burn to the right breast which occurred roughly 5 weeks ago according to the patient when she fell asleep due to her back pain with a heating pad on her back. Due to the lack of sensation in her breast she subsequently during the night rolled over onto the breast and this caused the burn that she is now been dealing with in the meantime. Unfortunately she still continue to have the back pain as well. According to her records it is stated that she has type II diabetes mellitus which is diet controlled and she's not checking her blood sugars at this point her most recent hemoglobin A1c with 6.5. On evaluation today she does have a eschar covering the majority the surface of the wound at this point and she does not seem to have any significant discomfort although she does have some pain. Patient also does have high blood pressure which is generally fairly well  controlled. She was placed on doxycycline. However at this point she has completed that course of therapy. She does have a history of right breast mastectomy secondary to breast cancer which she had in 1991. She separately had a reconstruction surgery in 1993. She  was also given Bactroban ointment by Dr. Derrel Nip who has been managing the burn up to this point. This was in conjunction with the doxycycline. Again fortunately patient does not seem to have significant pain at this point. 02/04/18 on evaluation today patient appears to be doing rather well in regard to her right lateral breast ulcer. This is secondary to a burn which was thermal in nature. With that being said she seems to have a fairly good granular bed at this point does have some slight epithelialization around the edges of the wound although this is slowly healing it does appear to be some maceration noted as well. She does not seem to have any evidence of infection which is good news. 02/11/18 on evaluation today patient presents for follow-up concerning her right breast ulcer. She has been tolerating the dressing changes without complication. With that being said the wound bed does appear to be doing somewhat better and she does have a skin island and around the 9 o'clock location in regard to the wound. With that being said we have gotten her approved for NuShield. I explained how this could be beneficial for wound healing. She stated that after hearing the information about this she would like to proceed with application to see if this will aid in healing. Fortunately she has no evidence of infection. This is NuShield application #1. 4/0/34 patient presents today after her initial NuShield application last week. She has done very well in the interim since I last saw her. She did take the dressing off to get a shower this morning although she left the mepitel in place. For future I told her she can also remove this to carefully wash  the area I do not see any problem with that. With that being said I think it may actually do better as the wound appear to be somewhat macerated I think due to the fact that he got wet doing her shower today. Otherwise she still has excellent response to the NuShield application she has been tolerating the dressings without complication whatsoever. Patient has no pain. She is here for NuShield application #2 7/42/59 on evaluation today patient is status post having had her second NuShield application which she seems to have done very well with at this point. She has evidence of great epithelialization and this one seems to be progressing very nicely. She is actually here for NuShield application #3. 5/63/87 on evaluation today patient has been doing very well with her NuShield applications. She does tell me that she had a little bit more drainage and even a little bleeding over the past week. She in fact the [facility] on Tuesday to inquire about this. The good news is she seems to actually have done very well as far as the wound is concerned and we are definitely seeing improvement week that week. She is here today for NuShield application #4. 5/64/33 on evaluation today patient appears to be doing very well in regard to her right breast ulcer. She has been tolerating the NuShield applications without complication and there does not appear to be any evidence of infection she has excellent granulation tissue noted. Overall though the actual measurements do not show it the wound is decreasing in size. She is here for NuShield application #5 12/25/49 on evaluation today the patient appears to be doing excellent in regard to her right lateral breast ulcer. She has been Stacie Mcdonald, Stacie T. ([id number]) tolerating the dressing changes without complication specifically the NuShield applications. She states  that she only had to change the dressing once during the week this go around. The good news is this one has  made excellent progress and is very close to complete closure at this time. She is having no pain. NuShield application #6 06/10/35 patient appears to be doing excellent with her NuShield applications. The word has progressed very nicely and there's just one small area remaining. This is application #7 today. Electronic Signature(s) Signed: 03/26/2018 10:27:37 AM By: Worthy Keeler PA-C Entered By: Worthy Keeler on 03/26/2018 09:25:49 Stacie Mcdonald, Stacie T. ([id number]) -------------------------------------------------------------------------------- Physical Exam Details Patient Name: Stacie Mcdonald, Britanee T. Date of Service: 03/25/2018 10:30 AM Medical Record Number: [id number] Patient Account Number: [id number] Date of Birth/Sex: December 19, 1945 (72 y.o. F) Treating RN: Montey Hora Primary Care Provider: Deborra Medina Other Clinician: Referring Provider: Deborra Medina Treating Provider/Extender: Melburn Hake, HOYT Weeks in Treatment: 8 Constitutional Well-nourished and well-hydrated in no acute distress. Respiratory normal breathing without difficulty. Psychiatric this patient is able to make decisions and demonstrates good insight into disease process. Alert and Oriented x 3. pleasant and cooperative. Notes Currently patient's wound did not have any slough covering the surface of the wound bed she does have new skin growing in this appears to be doing excellent as far as I'm concerned in regard to the healing process. We almost got her to completion. No fevers, chills, nausea, or vomiting noted at this time. Electronic Signature(s) Signed: 03/26/2018 10:27:37 AM By: Worthy Keeler PA-C Entered By: Worthy Keeler on 03/26/2018 09:26:36 Stacie Mcdonald, Stacie Mcdonald ([id number]) -------------------------------------------------------------------------------- Physician Orders Details Patient Name: Stacie Mcdonald, Leshonda T. Date of Service: 03/25/2018 10:30 AM Medical Record Number: [id number] Patient Account Number:  [id number] Date of Birth/Sex: 05-29-1946 (72 y.o. F) Treating RN: Montey Hora Primary Care Provider: Deborra Medina Other Clinician: Referring Provider: Deborra Medina Treating Provider/Extender: Melburn Hake, HOYT Weeks in Treatment: 8 Verbal / Phone Orders: No Diagnosis Coding ICD-10 Coding Code Description T31.0 Burns involving less than 10% of body surface L98.492 Non-pressure chronic ulcer of skin of other sites with fat layer exposed I10 Essential (primary) hypertension Wound Cleansing Wound #1 Right Breast o Clean wound with Normal Saline. o May Shower, gently pat wound dry prior to applying new dressing. Anesthetic (add to Medication List) Wound #1 Right Breast o Topical Lidocaine 4% cream applied to wound bed prior to debridement (In Clinic Only). Skin Barriers/Peri-Wound Care Wound #1 Right Breast o Skin Prep Primary Wound Dressing Wound #1 Right Breast o Mepitel One Contact layer o Drawtex o Other: - Nusheild applied today Secondary Dressing Wound #1 Right Breast o Boardered Foam Dressing Dressing Change Frequency Wound #1 Right Breast o Change dressing every week - may change outter bandage as needed for drainage Follow-up Appointments Wound #1 Right Breast o Return Appointment in 1 week. Additional Orders / Instructions Wound #1 Right Breast o Vitamin A; Vitamin C, Zinc - Please add a multivitamin with 100% of these vitamins and minerals o Increase protein intake. Stacie Mcdonald, Stacie T. ([id number]) Notes Nusheild applied in clinic; including contact layer, fixation with steri strips, drawtex and cover bandage Electronic Signature(s) Signed: 03/25/2018 4:42:41 PM By: Montey Hora Signed: 03/26/2018 10:27:37 AM By: Worthy Keeler PA-C Entered By: Montey Hora on 03/25/2018 11:18:07 Stacie Mcdonald, Stacie Mcdonald ([id number]) -------------------------------------------------------------------------------- Problem List Details Patient Name: Stacie Mcdonald,  Stacie T. Date of Service: 03/25/2018 10:30 AM Medical Record Number: [id number] Patient Account Number: [id number] Date of Birth/Sex: [birth year] (72 y.o. F) Treating RN: Montey Hora Primary Care Provider: Deborra Medina Other Clinician: Referring  Provider: Deborra Medina Treating Provider/Extender: Melburn Hake, HOYT Weeks in Treatment: 8 Active Problems ICD-10 Impacting Encounter Code Description Active Date Wound Healing Diagnosis T31.0 Burns involving less than 10% of body surface 01/28/2018 Yes L98.492 Non-pressure chronic ulcer of skin of other sites with fat layer 01/28/2018 Yes exposed [place] (primary) hypertension 01/28/2018 Yes Inactive Problems Resolved Problems Electronic Signature(s) Signed: 03/26/2018 10:27:37 AM By: Worthy Keeler PA-C Entered By: Worthy Keeler on 03/25/2018 10:55:05 Stacie Mcdonald, Stacie Mcdonald ([id number]) -------------------------------------------------------------------------------- Progress Note Details Patient Name: Stacie Mcdonald, Stacie T. Date of Service: 03/25/2018 10:30 AM Medical Record Number: [id number] Patient Account Number: [id number] Date of Birth/Sex: 1945/12/10 (72 y.o. F) Treating RN: Montey Hora Primary Care Provider: Deborra Medina Other Clinician: Referring Provider: Deborra Medina Treating Provider/Extender: Melburn Hake, HOYT Weeks in Treatment: 8 Subjective Chief Complaint Information obtained from Patient Left breast burn History of Present Illness (HPI) 01/28/18 on evaluation today patient presents for initial evaluation concerning a burn to the right breast which occurred roughly 5 weeks ago according to the patient when she fell asleep due to her back pain with a heating pad on her back. Due to the lack of sensation in her breast she subsequently during the night rolled over onto the breast and this caused the burn that she is now been dealing with in the meantime. Unfortunately she still continue to have the back pain as well.  According to her records it is stated that she has type II diabetes mellitus which is diet controlled and she's not checking her blood sugars at this point her most recent hemoglobin A1c with 6.5. On evaluation today she does have a eschar covering the majority the surface of the wound at this point and she does not seem to have any significant discomfort although she does have some pain. Patient also does have high blood pressure which is generally fairly well controlled. She was placed on doxycycline. However at this point she has completed that course of therapy. She does have a history of right breast mastectomy secondary to breast cancer which she had in 1991. She separately had a reconstruction surgery in 1993. She was also given Bactroban ointment by Dr. Derrel Nip who has been managing the burn up to this point. This was in conjunction with the doxycycline. Again fortunately patient does not seem to have significant pain at this point. 02/04/18 on evaluation today patient appears to be doing rather well in regard to her right lateral breast ulcer. This is secondary to a burn which was thermal in nature. With that being said she seems to have a fairly good granular bed at this point does have some slight epithelialization around the edges of the wound although this is slowly healing it does appear to be some maceration noted as well. She does not seem to have any evidence of infection which is good news. 02/11/18 on evaluation today patient presents for follow-up concerning her right breast ulcer. She has been tolerating the dressing changes without complication. With that being said the wound bed does appear to be doing somewhat better and she does have a skin island and around the 9 o'clock location in regard to the wound. With that being said we have gotten her approved for NuShield. I explained how this could be beneficial for wound healing. She stated that after hearing the information about  this she would like to proceed with application to see if this will aid in healing. Fortunately she has no evidence of infection. This is NuShield  application #1. 12/19/95 patient presents today after her initial NuShield application last week. She has done very well in the interim since I last saw her. She did take the dressing off to get a shower this morning although she left the mepitel in place. For future I told her she can also remove this to carefully wash the area I do not see any problem with that. With that being said I think it may actually do better as the wound appear to be somewhat macerated I think due to the fact that he got wet doing her shower today. Otherwise she still has excellent response to the NuShield application she has been tolerating the dressings without complication whatsoever. Patient has no pain. She is here for NuShield application #2 9/89/21 on evaluation today patient is status post having had her second NuShield application which she seems to have done very well with at this point. She has evidence of great epithelialization and this one seems to be progressing very nicely. She is actually here for NuShield application #3. 1/94/17 on evaluation today patient has been doing very well with her NuShield applications. She does tell me that she had a little bit more drainage and even a little bleeding over the past week. She in fact the [facility] on Tuesday to inquire about this. The good news is she seems to actually have done very well as far as the wound is concerned and we are definitely seeing improvement week that week. She is here today for NuShield application #4. Stacie Mcdonald, Stacie T. ([id number]) 03/11/18 on evaluation today patient appears to be doing very well in regard to her right breast ulcer. She has been tolerating the NuShield applications without complication and there does not appear to be any evidence of infection she has excellent granulation tissue noted.  Overall though the actual measurements do not show it the wound is decreasing in size. She is here for NuShield application #5 03/21/30 on evaluation today the patient appears to be doing excellent in regard to her right lateral breast ulcer. She has been tolerating the dressing changes without complication specifically the NuShield applications. She states that she only had to change the dressing once during the week this go around. The good news is this one has made excellent progress and is very close to complete closure at this time. She is having no pain. NuShield application #6 4/97/02 patient appears to be doing excellent with her NuShield applications. The word has progressed very nicely and there's just one small area remaining. This is application #7 today. Patient History Information obtained from Patient. Family History Cancer - Father, Diabetes - Siblings, Heart Disease - Mother,Father, Hypertension - Mother, Kidney Disease - Siblings, Lung Disease - Father, Stroke - Father, Thyroid Problems - Siblings, No family history of Seizures, Tuberculosis. Social History Never smoker, Marital Status - Married, Alcohol Use - Rarely, Drug Use - No History, Caffeine Use - Daily. 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 Respiratory The patient has no complaints or symptoms. Cardiovascular The patient has no complaints or symptoms. Psychiatric The patient has no complaints or symptoms. Objective Constitutional Well-nourished and well-hydrated in no acute distress. Vitals Time Taken: 10:35 AM, Height: 64 in, Weight: 183 lbs, BMI: 31.4, Temperature: 98.6 F, Pulse: 91 bpm, Respiratory Rate: 18 breaths/min, Blood Pressure: 129/63 mmHg. Respiratory normal breathing without difficulty. Stacie Mcdonald, Stacie T.  ([id number]) Psychiatric this  patient is able to make decisions and demonstrates good insight into disease process. Alert and Oriented x 3. pleasant and cooperative. General Notes: Currently patient's wound did not have any slough covering the surface of the wound bed she does have new skin growing in this appears to be doing excellent as far as I'm concerned in regard to the healing process. We almost got her to completion. No fevers, chills, nausea, or vomiting noted at this time. Integumentary (Hair, Skin) Wound #1 status is Open. Original cause of wound was Thermal Burn. The wound is located on the Right Breast. The wound measures 1cm length x 3cm width x 0.1cm depth; 2.356cm^2 area and 0.236cm^3 volume. There is Fat Layer (Subcutaneous Tissue) Exposed exposed. There is no tunneling or undermining noted. There is a large amount of serosanguineous drainage noted. The wound margin is distinct with the outline attached to the wound base. There is large (67-100%) red granulation within the wound bed. There is a small (1-33%) amount of necrotic tissue within the wound bed including Adherent Slough. The periwound skin appearance exhibited: Dry/Scaly. The periwound skin appearance did not exhibit: Callus, Crepitus, Excoriation, Induration, Rash, Scarring, Maceration, Atrophie Blanche, Cyanosis, Ecchymosis, Hemosiderin Staining, Mottled, Pallor, Rubor, Erythema. Periwound temperature was noted as No Abnormality. 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 A skin graft procedure using a bioengineered skin substitute/cellular or tissue based product was performed by STONE III, HOYT E., PA-C with the following instrument(s):  Forceps and Scissors. Other was applied and secured with Steri-Strips. 3 sq cm of product was utilized and 5 sq cm was wasted due to wound size. Post Application, mepitel one was applied. A Time Out was conducted at 11:21, prior to the start of the procedure. The procedure was tolerated well with a pain level of 0 throughout and a pain level of 0 following the procedure. Patient s Level of Consciousness post procedure was recorded as Awake and Alert and post-procedure vitals were taken including Temperature: 98.6 F, Pulse: 91 bpm, Respiratory Rate: 18 breaths/min, Blood Pressure: (129)/(63) mmHg. Post procedure Diagnosis Wound #1: Same as Pre-Procedure . Plan Wound Cleansing: Wound #1 Right Breast: Stacie Mcdonald, Stacie T. ([id number]) Clean wound with Normal Saline. May Shower, gently pat wound dry prior to applying new dressing. Anesthetic (add to Medication List): Wound #1 Right Breast: Topical Lidocaine 4% cream applied to wound bed prior to debridement (In Clinic Only). Skin Barriers/Peri-Wound Care: Wound #1 Right Breast: Skin Prep Primary Wound Dressing: Wound #1 Right Breast: Mepitel One Contact layer Drawtex Other: - Nusheild applied today Secondary Dressing: Wound #1 Right Breast: Boardered Foam Dressing Dressing Change Frequency: Wound #1 Right Breast: Change dressing every week - may change outter bandage as needed for drainage Follow-up Appointments: Wound #1 Right Breast: Return Appointment in 1 week. Additional Orders / Instructions: Wound #1 Right Breast: Vitamin A; Vitamin C, Zinc - Please add a multivitamin with 100% of these vitamins and minerals Increase protein intake. General Notes: Nusheild applied in clinic; including contact layer, fixation with steri strips, drawtex and cover bandage I'm gonna recommend currently that we go ahead with the NuShield application today which he tolerated very well and hopefully this will continue to progress nicely as far as  healing is concerned. We will  see her for reevaluation in one weeks time and we will see were things are at that point. Please see above for specific wound care orders. We will see patient for re-evaluation in 1 week(s) here in the clinic. If anything worsens or changes patient will contact our office for additional recommendations. Electronic Signature(s) Signed: 03/26/2018 10:27:37 AM By: Worthy Keeler PA-C Entered By: Worthy Keeler on 03/26/2018 09:27:29 Stacie Mcdonald, Hermila T. ([id number]) -------------------------------------------------------------------------------- ROS/PFSH Details Patient Name: Stacie Mcdonald, Stacie T. Date of Service: 03/25/2018 10:30 AM Medical Record Number: [id number] Patient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

## 2018-04-01 ENCOUNTER — Encounter: Payer: Medicare Other | Admitting: Physician Assistant

## 2018-04-01 DIAGNOSIS — T2131XA Burn of third degree of chest wall, initial encounter: Secondary | ICD-10-CM | POA: Diagnosis not present

## 2018-04-01 DIAGNOSIS — I509 Heart failure, unspecified: Secondary | ICD-10-CM | POA: Diagnosis not present

## 2018-04-01 DIAGNOSIS — I110 Hypertensive heart disease with heart failure: Secondary | ICD-10-CM | POA: Diagnosis not present

## 2018-04-01 DIAGNOSIS — I252 Old myocardial infarction: Secondary | ICD-10-CM | POA: Diagnosis not present

## 2018-04-01 DIAGNOSIS — E11622 Type 2 diabetes mellitus with other skin ulcer: Secondary | ICD-10-CM | POA: Diagnosis not present

## 2018-04-01 DIAGNOSIS — L98492 Non-pressure chronic ulcer of skin of other sites with fat layer exposed: Secondary | ICD-10-CM | POA: Diagnosis not present
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
## 2018-04-05 NOTE — Progress Notes (Signed)
Stacie Mcdonald, Cierria T. ([id number]) Visit Report for 04/01/2018 Chief Complaint Document Details Patient Name: Stacie Mcdonald, [name] Date of Service: 04/01/2018 2:45 PM Medical Record Number: [id number] Patient Account Number: [id number] Date of Birth/Sex: 1945/12/06 (72 y.o. F) Treating RN: Montey Hora Primary Care Provider: Deborra Medina Other Clinician: Referring Provider: Deborra Medina Treating Provider/Extender: Melburn Hake, HOYT Weeks in Treatment: 9 Information Obtained from: Patient Chief Complaint Right breast burn Electronic Signature(s) Signed: 04/02/2018 1:18:07 PM By: Worthy Keeler PA-C Entered By: Worthy Keeler on 04/01/2018 14:46:58 Stacie Mcdonald, Jenetta Downer ([id number]) -------------------------------------------------------------------------------- Cellular or Tissue Based Product Details Patient Name: Stacie Mcdonald, [name] Date of Service: 04/01/2018 2:45 PM Medical Record Number: [id number] Patient Account Number: [id number] Date of Birth/Sex: 1946-03-02 (72 y.o. F) Treating RN: Montey Hora Primary Care Provider: Deborra Medina Other Clinician: Referring Provider: Deborra Medina Treating Provider/Extender: Melburn Hake, HOYT Weeks in Treatment: 9 Cellular or Tissue Based Wound #1 Right Breast Product Type Applied to: Performed By: Physician STONE III, HOYT E., PA-C Cellular or Tissue Based Other Product Type: Pre-procedure Verification/Time Yes - 16:02 Out Taken: Location: trunk / arms / legs Wound Size (sq cm): 0.75 Product Size (sq cm): 8 Waste Size (sq cm): 7 Waste Reason: wound size Amount of Product Applied (sq cm): 1 Instrument Used: Forceps, Scissors Lot #: [id number]; [id number] Expiration Date: 03/08/2023 Fenestrated: No Reconstituted: Yes Solution Type: normal saline Solution Amount: 3 ml Lot #: [id number] Solution Expiration Date: 12/18/2019 Secured: Yes Secured With: Steri-Strips Dressing Applied: Yes Primary Dressing: mepitel one Procedural Pain: 0 Post Procedural  Pain: 0 Response to Treatment: Procedure was tolerated well Level of Consciousness: Awake and Alert Post Procedure Vitals: Temperature: 98.5 Pulse: 79 Respiratory Rate: 18 Blood Pressure: Systolic Blood Pressure: 888 Diastolic Blood Pressure: 64 Post Procedure Diagnosis Same as Pre-procedure Electronic Signature(s) Signed: 04/01/2018 4:56:04 PM By: Dorthy, Joanna Stacie Mcdonald, Jenetta Downer ([id number]) Entered By: Montey Hora on 04/01/2018 16:07:13 Stacie Mcdonald, Jenetta Downer ([id number]) -------------------------------------------------------------------------------- HPI Details Patient Name: Stacie Mcdonald, [name] Date of Service: 04/01/2018 2:45 PM Medical Record Number: [id number] Patient Account Number: [id number] Date of Birth/Sex: 1946-08-01 (72 y.o. F) Treating RN: Montey Hora Primary Care Provider: Deborra Medina Other Clinician: Referring Provider: Deborra Medina Treating Provider/Extender: Melburn Hake, HOYT Weeks in Treatment: 9 History of Present Illness HPI Description: 01/28/18 on evaluation today patient presents for initial evaluation concerning a burn to the right breast which occurred roughly 5 weeks ago according to the patient when she fell asleep due to her back pain with a heating pad on her back. Due to the lack of sensation in her breast she subsequently during the night rolled over onto the breast and this caused the burn that she is now been dealing with in the meantime. Unfortunately she still continue to have the back pain as well. According to her records it is stated that she has type II diabetes mellitus which is diet controlled and she's not checking her blood sugars at this point her most recent hemoglobin A1c with 6.5. On evaluation today she does have a eschar covering the majority the surface of the wound at this point and she does not seem to have any significant discomfort although she does have some pain. Patient also does have high blood pressure which is generally  fairly well controlled. She was placed on doxycycline. However at this point she has completed that course of therapy. She does have a history of right breast mastectomy secondary to breast cancer which she had in 1991. She separately had a reconstruction surgery  in 1993. She was also given Bactroban ointment by Dr. Derrel Nip who has been managing the burn up to this point. This was in conjunction with the doxycycline. Again fortunately patient does not seem to have significant pain at this point. 02/04/18 on evaluation today patient appears to be doing rather well in regard to her right lateral breast ulcer. This is secondary to a burn which was thermal in nature. With that being said she seems to have a fairly good granular bed at this point does have some slight epithelialization around the edges of the wound although this is slowly healing it does appear to be some maceration noted as well. She does not seem to have any evidence of infection which is good news. 02/11/18 on evaluation today patient presents for follow-up concerning her right breast ulcer. She has been tolerating the dressing changes without complication. With that being said the wound bed does appear to be doing somewhat better and she does have a skin island and around the 9 o'clock location in regard to the wound. With that being said we have gotten her approved for NuShield. I explained how this could be beneficial for wound healing. She stated that after hearing the information about this she would like to proceed with application to see if this will aid in healing. Fortunately she has no evidence of infection. This is NuShield application #1. 11/19/46 patient presents today after her initial NuShield application last week. She has done very well in the interim since I last saw her. She did take the dressing off to get a shower this morning although she left the mepitel in place. For future I told her she can also remove this to  carefully wash the area I do not see any problem with that. With that being said I think it may actually do better as the wound appear to be somewhat macerated I think due to the fact that he got wet doing her shower today. Otherwise she still has excellent response to the NuShield application she has been tolerating the dressings without complication whatsoever. Patient has no pain. She is here for NuShield application #2 1/85/63 on evaluation today patient is status post having had her second NuShield application which she seems to have done very well with at this point. She has evidence of great epithelialization and this one seems to be progressing very nicely. She is actually here for NuShield application #3. 1/49/70 on evaluation today patient has been doing very well with her NuShield applications. She does tell me that she had a little bit more drainage and even a little bleeding over the past week. She in fact the [facility] on Tuesday to inquire about this. The good news is she seems to actually have done very well as far as the wound is concerned and we are definitely seeing improvement week that week. She is here today for NuShield application #4. 2/63/78 on evaluation today patient appears to be doing very well in regard to her right breast ulcer. She has been tolerating the NuShield applications without complication and there does not appear to be any evidence of infection she has excellent granulation tissue noted. Overall though the actual measurements do not show it the wound is decreasing in size. She is here for NuShield application #5 03/23/84 on evaluation today the patient appears to be doing excellent in regard to her right lateral breast ulcer. She has been Stacie Mcdonald, Zaliah T. ([id number]) tolerating the dressing changes without complication specifically the NuShield  applications. She states that she only had to change the dressing once during the week this go around. The good news  is this one has made excellent progress and is very close to complete closure at this time. She is having no pain. NuShield application #6 01/29/16 patient appears to be doing excellent with her NuShield applications. The word has progressed very nicely and there's just one small area remaining. This is application #7 today. 04/01/18 on evaluation today patient presents for what appears will likely be her last application of the NuShield. She has tolerated treatment very well in her wound appears that healed excellent. Overall I am pleased with the progress that she is made and the patient is likewise very pleased. She is actually here for application #8 and this is almost completely healed. Electronic Signature(s) Signed: 04/02/2018 1:18:07 PM By: Worthy Keeler PA-C Entered By: Worthy Keeler on 04/02/2018 13:15:48 Stacie Mcdonald, Jenetta Downer ([id number]) -------------------------------------------------------------------------------- Physical Exam Details Patient Name: Stacie Mcdonald, Tosha T. Date of Service: 04/01/2018 2:45 PM Medical Record Number: [id number] Patient Account Number: [id number] Date of Birth/Sex: 05-06-1946 (72 y.o. F) Treating RN: Montey Hora Primary Care Provider: Deborra Medina Other Clinician: Referring Provider: Deborra Medina Treating Provider/Extender: Melburn Hake, HOYT Weeks in Treatment: 9 Constitutional Well-nourished and well-hydrated in no acute distress. Respiratory normal breathing without difficulty. Psychiatric this patient is able to make decisions and demonstrates good insight into disease process. Alert and Oriented x 3. pleasant and cooperative. Notes I'm gonna suggest at this point that we go ahead and apply the eighth application of the NuShield which she tolerated well today without complication. I feel like we will not have to apply any further going forward patient is glad to hear this. Fortunately there is no evidence of infection and she is following the  therapy very well. This was applied by myself. Electronic Signature(s) Signed: 04/02/2018 1:18:07 PM By: Worthy Keeler PA-C Entered By: Worthy Keeler on 04/02/2018 13:16:41 Stacie Mcdonald, Jenetta Downer ([id number]) -------------------------------------------------------------------------------- Physician Orders Details Patient Name: Stacie Mcdonald, Latanya T. Date of Service: 04/01/2018 2:45 PM Medical Record Number: [id number] Patient Account Number: [id number] Date of Birth/Sex: [birth year] (72 y.o. F) Treating RN: Montey Hora Primary Care Provider: Deborra Medina Other Clinician: Referring Provider: Deborra Medina Treating Provider/Extender: Melburn Hake, HOYT Weeks in Treatment: 9 Verbal / Phone Orders: No Diagnosis Coding ICD-10 Coding Code Description T31.0 Burns involving less than 10% of body surface L98.492 Non-pressure chronic ulcer of skin of other sites with fat layer exposed I10 Essential (primary) hypertension Wound Cleansing Wound #1 Right Breast o Clean wound with Normal Saline. o May Shower, gently pat wound dry prior to applying new dressing. Anesthetic (add to Medication List) Wound #1 Right Breast o Topical Lidocaine 4% cream applied to wound bed prior to debridement (In Clinic Only). Skin Barriers/Peri-Wound Care Wound #1 Right Breast o Skin Prep Primary Wound Dressing Wound #1 Right Breast o Mepitel One Contact layer o Drawtex o Other: - Nusheild applied today Secondary Dressing Wound #1 Right Breast o Boardered Foam Dressing Dressing Change Frequency Wound #1 Right Breast o Change dressing every week - may change outter bandage as needed for drainage Follow-up Appointments Wound #1 Right Breast o Return Appointment in 1 week. Additional Orders / Instructions Wound #1 Right Breast o Vitamin A; Vitamin C, Zinc - Please add a multivitamin with 100% of these vitamins and minerals o Increase protein intake. Stacie Mcdonald, Shadana T.  ([id number]) Notes Nusheild applied in clinic; including contact layer, fixation with steri strips, drawtex  and cover bandage Electronic Signature(s) Signed: 04/01/2018 4:56:04 PM By: Montey Hora Signed: 04/02/2018 1:18:07 PM By: Worthy Keeler PA-C Entered By: Montey Hora on 04/01/2018 16:09:44 Stacie Mcdonald, Jenetta Downer ([id number]) -------------------------------------------------------------------------------- Problem List Details Patient Name: Stacie Mcdonald, Rhylan T. Date of Service: 04/01/2018 2:45 PM Medical Record Number: [id number] Patient Account Number: [id number] Date of Birth/Sex: [birth year] (72 y.o. F) Treating RN: Montey Hora Primary Care Provider: Deborra Medina Other Clinician: Referring Provider: Deborra Medina Treating Provider/Extender: Melburn Hake, HOYT Weeks in Treatment: 9 Active Problems ICD-10 Impacting Encounter Code Description Active Date Wound Healing Diagnosis T31.0 Burns involving less than 10% of body surface 01/28/2018 Yes L98.492 Non-pressure chronic ulcer of skin of other sites with fat layer 01/28/2018 Yes exposed [place] (primary) hypertension 01/28/2018 Yes Inactive Problems Resolved Problems Electronic Signature(s) Signed: 04/02/2018 1:18:07 PM By: Worthy Keeler PA-C Entered By: Worthy Keeler on 04/01/2018 14:46:20 Stacie Mcdonald, Jenetta Downer ([id number]) -------------------------------------------------------------------------------- Progress Note Details Patient Name: Stacie Mcdonald, Reita T. Date of Service: 04/01/2018 2:45 PM Medical Record Number: [id number] Patient Account Number: [id number] Date of Birth/Sex: 09/05/1946 (72 y.o. F) Treating RN: Montey Hora Primary Care Provider: Deborra Medina Other Clinician: Referring Provider: Deborra Medina Treating Provider/Extender: Melburn Hake, HOYT Weeks in Treatment: 9 Subjective Chief Complaint Information obtained from Patient Right breast burn History of Present Illness (HPI) 01/28/18 on evaluation today  patient presents for initial evaluation concerning a burn to the right breast which occurred roughly 5 weeks ago according to the patient when she fell asleep due to her back pain with a heating pad on her back. Due to the lack of sensation in her breast she subsequently during the night rolled over onto the breast and this caused the burn that she is now been dealing with in the meantime. Unfortunately she still continue to have the back pain as well. According to her records it is stated that she has type II diabetes mellitus which is diet controlled and she's not checking her blood sugars at this point her most recent hemoglobin A1c with 6.5. On evaluation today she does have a eschar covering the majority the surface of the wound at this point and she does not seem to have any significant discomfort although she does have some pain. Patient also does have high blood pressure which is generally fairly well controlled. She was placed on doxycycline. However at this point she has completed that course of therapy. She does have a history of right breast mastectomy secondary to breast cancer which she had in 1991. She separately had a reconstruction surgery in 1993. She was also given Bactroban ointment by Dr. Derrel Nip who has been managing the burn up to this point. This was in conjunction with the doxycycline. Again fortunately patient does not seem to have significant pain at this point. 02/04/18 on evaluation today patient appears to be doing rather well in regard to her right lateral breast ulcer. This is secondary to a burn which was thermal in nature. With that being said she seems to have a fairly good granular bed at this point does have some slight epithelialization around the edges of the wound although this is slowly healing it does appear to be some maceration noted as well. She does not seem to have any evidence of infection which is good news. 02/11/18 on evaluation today patient presents  for follow-up concerning her right breast ulcer. She has been tolerating the dressing changes without complication. With that being said the wound bed does appear to be doing somewhat  better and she does have a skin island and around the 9 o'clock location in regard to the wound. With that being said we have gotten her approved for NuShield. I explained how this could be beneficial for wound healing. She stated that after hearing the information about this she would like to proceed with application to see if this will aid in healing. Fortunately she has no evidence of infection. This is NuShield application #1. 07/22/21 patient presents today after her initial NuShield application last week. She has done very well in the interim since I last saw her. She did take the dressing off to get a shower this morning although she left the mepitel in place. For future I told her she can also remove this to carefully wash the area I do not see any problem with that. With that being said I think it may actually do better as the wound appear to be somewhat macerated I think due to the fact that he got wet doing her shower today. Otherwise she still has excellent response to the NuShield application she has been tolerating the dressings without complication whatsoever. Patient has no pain. She is here for NuShield application #2 2/97/98 on evaluation today patient is status post having had her second NuShield application which she seems to have done very well with at this point. She has evidence of great epithelialization and this one seems to be progressing very nicely. She is actually here for NuShield application #3. 08/06/18 on evaluation today patient has been doing very well with her NuShield applications. She does tell me that she had a little bit more drainage and even a little bleeding over the past week. She in fact the [facility] on Tuesday to inquire about this. The good news is she seems to actually have  done very well as far as the wound is concerned and we are definitely seeing improvement week that week. She is here today for NuShield application #4. Stacie Mcdonald, Lanina T. ([id number]) 03/11/18 on evaluation today patient appears to be doing very well in regard to her right breast ulcer. She has been tolerating the NuShield applications without complication and there does not appear to be any evidence of infection she has excellent granulation tissue noted. Overall though the actual measurements do not show it the wound is decreasing in size. She is here for NuShield application #5 06/16/84 on evaluation today the patient appears to be doing excellent in regard to her right lateral breast ulcer. She has been tolerating the dressing changes without complication specifically the NuShield applications. She states that she only had to change the dressing once during the week this go around. The good news is this one has made excellent progress and is very close to complete closure at this time. She is having no pain. NuShield application #6 6/31/49 patient appears to be doing excellent with her NuShield applications. The word has progressed very nicely and there's just one small area remaining. This is application #7 today. 04/01/18 on evaluation today patient presents for what appears will likely be her last application of the NuShield. She has tolerated treatment very well in her wound appears that healed excellent. Overall I am pleased with the progress that she is made and the patient is likewise very pleased. She is actually here for application #8 and this is almost completely healed. Patient History Information obtained from Patient. Family History Cancer - Father, Diabetes - Siblings, Heart Disease - Mother,Father, Hypertension - Mother, Kidney Disease - Siblings,  Lung Disease - Father, Stroke - Father, Thyroid Problems - Siblings, No family history of Seizures, Tuberculosis. Social History Never  smoker, Marital Status - Married, Alcohol Use - Rarely, Drug Use - No History, Caffeine Use - Daily. 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 Respiratory The patient has no complaints or symptoms. Cardiovascular The patient has no complaints or symptoms. Psychiatric The patient has no complaints or symptoms. Objective Constitutional Well-nourished and well-hydrated in no acute distress. Vitals Time Taken: 3:22 PM, Height: 64 in, Weight: 183 lbs, BMI: 31.4, Temperature: 98.5 F, Pulse: 79 bpm, Respiratory Rate: 18 breaths/min, Blood Pressure: 150/64 mmHg. Stacie Mcdonald, Eleanor T. ([id number]) Respiratory normal breathing without difficulty. Psychiatric this patient is able to make decisions and demonstrates good insight into disease process. Alert and Oriented x 3. pleasant and cooperative. General Notes: I'm gonna suggest at this point that we go ahead and apply the eighth application of the NuShield which she tolerated well today without complication. I feel like we will not have to apply any further going forward patient is glad to hear this. Fortunately there is no evidence of infection and she is following the therapy very well. This was applied by myself. Integumentary (Hair, Skin) Wound #1 status is Open. Original cause of wound was Thermal Burn. The wound is located on the Right Breast. The wound measures 0.5cm length x 1.5cm width x 0.1cm depth; 0.589cm^2 area and 0.059cm^3 volume. There is Fat Layer (Subcutaneous Tissue) Exposed exposed. There is no tunneling or undermining noted. There is a large amount of serosanguineous drainage noted. The wound margin is distinct with the outline attached to the wound base. There is large (67-100%) red granulation within the wound bed. There is a small (1-33%) amount of necrotic tissue  within the wound bed including Adherent Slough. The periwound skin appearance exhibited: Dry/Scaly. The periwound skin appearance did not exhibit: Callus, Crepitus, Excoriation, Induration, Rash, Scarring, Maceration, Atrophie Blanche, Cyanosis, Ecchymosis, Hemosiderin Staining, Mottled, Pallor, Rubor, Erythema. Periwound temperature was noted as No Abnormality. 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 A skin graft procedure using a bioengineered skin substitute/cellular or tissue based product was performed by STONE III, HOYT E., PA-C with the following instrument(s): Forceps and Scissors. Other was applied and secured with Steri-Strips. 1 sq cm of product was utilized and 7 sq cm was wasted due to wound size. Post Application, mepitel one was applied. A Time Out was conducted at 16:02, prior to the start of the procedure. The procedure was tolerated well with a pain level of 0 throughout and a pain level of 0 following the procedure. Patient s Level of Consciousness post procedure was recorded as Awake and Alert and post-procedure vitals were taken including Temperature: 98.5 F, Pulse: 79 bpm, Respiratory Rate: 18 breaths/min, Blood Pressure: (150)/(64) mmHg. Post procedure Diagnosis Wound #1: Same as Pre-Procedure . Stacie Mcdonald, Samarie T. ([id number]) Plan Wound Cleansing: Wound #1 Right Breast: Clean wound with Normal Saline. May Shower, gently pat wound dry prior to applying new dressing. Anesthetic (add to Medication List): Wound #1 Right Breast: Topical Lidocaine 4% cream applied to wound bed prior to debridement (In Clinic Only). Skin  Barriers/Peri-Wound Care: Wound #1 Right Breast: Skin Prep Primary Wound  Dressing: Wound #1 Right Breast: Mepitel One Contact layer Drawtex Other: - Nusheild applied today Secondary Dressing: Wound #1 Right Breast: Boardered Foam Dressing Dressing Change Frequency: Wound #1 Right Breast: Change dressing every week - may change outter bandage as needed for drainage Follow-up Appointments: Wound #1 Right Breast: Return Appointment in 1 week. Additional Orders / Instructions: Wound #1 Right Breast: Vitamin A; Vitamin C, Zinc - Please add a multivitamin with 100% of these vitamins and minerals Increase protein intake. General Notes: Nusheild applied in clinic; including contact layer, fixation with steri strips, drawtex and cover bandage We will see her for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

## 2018-04-07 ENCOUNTER — Other Ambulatory Visit: Payer: Self-pay

## 2018-04-07 ENCOUNTER — Encounter
Admission: RE | Disposition: A | Discharge status: Home / Self Care | Payer: Self-pay | Source: Ambulatory Visit | Attending: Internal Medicine

## 2018-04-07 ENCOUNTER — Observation Stay
Admission: RE | Admit: 2018-04-07 | Discharge: 2018-04-08 | Disposition: A | Discharge status: Home / Self Care | Payer: Medicare Other | Source: Ambulatory Visit | Attending: Internal Medicine | Admitting: Internal Medicine

## 2018-04-07 DIAGNOSIS — I2510 Atherosclerotic heart disease of native coronary artery without angina pectoris: Secondary | ICD-10-CM | POA: Diagnosis not present

## 2018-04-07 DIAGNOSIS — Z7982 Long term (current) use of aspirin: Secondary | ICD-10-CM | POA: Insufficient documentation

## 2018-04-07 DIAGNOSIS — I252 Old myocardial infarction: Secondary | ICD-10-CM | POA: Diagnosis not present

## 2018-04-07 DIAGNOSIS — Z955 Presence of coronary angioplasty implant and graft: Secondary | ICD-10-CM

## 2018-04-07 DIAGNOSIS — Z6830 Body mass index (BMI) 30.0-30.9, adult: Secondary | ICD-10-CM | POA: Diagnosis not present

## 2018-04-07 DIAGNOSIS — J449 Chronic obstructive pulmonary disease, unspecified: Secondary | ICD-10-CM | POA: Insufficient documentation

## 2018-04-07 DIAGNOSIS — Z8249 Family history of ischemic heart disease and other diseases of the circulatory system: Secondary | ICD-10-CM | POA: Diagnosis not present

## 2018-04-07 DIAGNOSIS — I200 Unstable angina: Secondary | ICD-10-CM | POA: Diagnosis not present

## 2018-04-07 DIAGNOSIS — K219 Gastro-esophageal reflux disease without esophagitis: Secondary | ICD-10-CM | POA: Insufficient documentation

## 2018-04-07 DIAGNOSIS — R0602 Shortness of breath: Secondary | ICD-10-CM

## 2018-04-07 DIAGNOSIS — E782 Mixed hyperlipidemia: Secondary | ICD-10-CM | POA: Diagnosis not present

## 2018-04-07 DIAGNOSIS — I110 Hypertensive heart disease with heart failure: Secondary | ICD-10-CM | POA: Diagnosis not present

## 2018-04-07 DIAGNOSIS — E669 Obesity, unspecified: Secondary | ICD-10-CM | POA: Insufficient documentation

## 2018-04-07 DIAGNOSIS — I509 Heart failure, unspecified: Secondary | ICD-10-CM | POA: Diagnosis not present

## 2018-04-07 DIAGNOSIS — M810 Age-related osteoporosis without current pathological fracture: Secondary | ICD-10-CM | POA: Insufficient documentation

## 2018-04-07 DIAGNOSIS — Z7902 Long term (current) use of antithrombotics/antiplatelets: Secondary | ICD-10-CM | POA: Diagnosis not present

## 2018-04-07 HISTORY — PX: RIGHT/LEFT HEART CATH AND CORONARY ANGIOGRAPHY: CATH118266

## 2018-04-07 HISTORY — PX: CORONARY STENT INTERVENTION: CATH118234

## 2018-04-07 LAB — CARDIAC CATHETERIZATION: CATHEFQUANT: 50 %

## 2018-04-07 LAB — POCT ACTIVATED CLOTTING TIME: Activated Clotting Time: 329 seconds

## 2018-04-07 LAB — GLUCOSE, CAPILLARY: GLUCOSE-CAPILLARY: 100 mg/dL — AB (ref 65–99)

## 2018-04-07 SURGERY — RIGHT/LEFT HEART CATH AND CORONARY ANGIOGRAPHY
Anesthesia: Moderate Sedation

## 2018-04-07 MED ORDER — LIDOCAINE HCL (PF) 1 % IJ SOLN
INTRAMUSCULAR | Status: AC
  Filled 2018-04-07: qty 30

## 2018-04-07 MED ORDER — ASPIRIN 81 MG PO CHEW
CHEWABLE_TABLET | ORAL | Status: DC | PRN
  Administered 2018-04-07: 243 mg via ORAL

## 2018-04-07 MED ORDER — ASPIRIN 81 MG PO CHEW
CHEWABLE_TABLET | ORAL | Status: AC
  Filled 2018-04-07: qty 1

## 2018-04-07 MED ORDER — SODIUM CHLORIDE 0.9 % WEIGHT BASED INFUSION
3.0000 mL/kg/h | INTRAVENOUS | Status: DC
  Administered 2018-04-07: 3 mL/kg/h via INTRAVENOUS

## 2018-04-07 MED ORDER — PANTOPRAZOLE SODIUM 40 MG PO TBEC
40.0000 mg | DELAYED_RELEASE_TABLET | Freq: Every day | ORAL | Status: DC
Start: 2018-04-07 — End: 2018-04-08
  Administered 2018-04-08: 40 mg via ORAL
  Filled 2018-04-07 (×2): qty 1

## 2018-04-07 MED ORDER — FENTANYL CITRATE (PF) 100 MCG/2ML IJ SOLN
INTRAMUSCULAR | Status: AC
  Filled 2018-04-07: qty 2

## 2018-04-07 MED ORDER — ASPIRIN 81 MG PO CHEW
81.0000 mg | CHEWABLE_TABLET | ORAL | Status: AC
  Administered 2018-04-07: 81 mg via ORAL

## 2018-04-07 MED ORDER — ACETAMINOPHEN 325 MG PO TABS: 650.0000 mg | ORAL_TABLET | ORAL | Status: DC | PRN

## 2018-04-07 MED ORDER — LABETALOL HCL 5 MG/ML IV SOLN: 10.0000 mg | INTRAVENOUS | Status: AC | PRN

## 2018-04-07 MED ORDER — HYDRALAZINE HCL 20 MG/ML IJ SOLN: 5.0000 mg | INTRAMUSCULAR | Status: AC | PRN

## 2018-04-07 MED ORDER — BIVALIRUDIN TRIFLUOROACETATE 250 MG IV SOLR
INTRAVENOUS | Status: AC
  Filled 2018-04-07: qty 250

## 2018-04-07 MED ORDER — SODIUM CHLORIDE 0.9 % WEIGHT BASED INFUSION: 1.0000 mL/kg/h | INTRAVENOUS | Status: DC

## 2018-04-07 MED ORDER — CLOPIDOGREL BISULFATE 75 MG PO TABS
75.0000 mg | ORAL_TABLET | Freq: Every day | ORAL | Status: DC
  Administered 2018-04-08: 75 mg via ORAL
  Filled 2018-04-07: qty 1

## 2018-04-07 MED ORDER — LIDOCAINE HCL (PF) 1 % IJ SOLN
INTRAMUSCULAR | Status: DC | PRN
  Administered 2018-04-07: 15 mL

## 2018-04-07 MED ORDER — ASPIRIN 81 MG PO CHEW
CHEWABLE_TABLET | ORAL | Status: AC
  Filled 2018-04-07: qty 3

## 2018-04-07 MED ORDER — SODIUM CHLORIDE 0.9 % IV SOLN: 250.0000 mL | INTRAVENOUS | Status: DC | PRN

## 2018-04-07 MED ORDER — CLOPIDOGREL BISULFATE 75 MG PO TABS
ORAL_TABLET | ORAL | Status: AC
  Filled 2018-04-07: qty 4

## 2018-04-07 MED ORDER — SODIUM CHLORIDE 0.9 % IV SOLN
INTRAVENOUS | Status: AC | PRN
  Administered 2018-04-07: 1.75 mg/kg/h via INTRAVENOUS

## 2018-04-07 MED ORDER — SODIUM CHLORIDE 0.9 % WEIGHT BASED INFUSION: 3.0000 mL/kg/h | INTRAVENOUS | Status: AC

## 2018-04-07 MED ORDER — CLOPIDOGREL BISULFATE 75 MG PO TABS
ORAL_TABLET | ORAL | Status: DC | PRN
  Administered 2018-04-07: 300 mg via ORAL

## 2018-04-07 MED ORDER — DIGOXIN 125 MCG PO TABS
0.1250 mg | ORAL_TABLET | Freq: Every day | ORAL | Status: DC
  Administered 2018-04-08: 0.125 mg via ORAL
  Filled 2018-04-07 (×2): qty 1

## 2018-04-07 MED ORDER — MIDAZOLAM HCL 2 MG/2ML IJ SOLN
INTRAMUSCULAR | Status: AC
Start: 2018-04-07 — End: ?
  Filled 2018-04-07: qty 2

## 2018-04-07 MED ORDER — ALPRAZOLAM 1 MG PO TABS
1.0000 mg | ORAL_TABLET | Freq: Two times a day (BID) | ORAL | Status: DC | PRN
  Administered 2018-04-07: 1 mg via ORAL
  Filled 2018-04-07: qty 1

## 2018-04-07 MED ORDER — POTASSIUM CHLORIDE 20 MEQ PO PACK
40.0000 meq | PACK | Freq: Two times a day (BID) | ORAL | Status: DC
Start: 2018-04-07 — End: 2018-04-08
  Administered 2018-04-07 – 2018-04-08 (×2): 40 meq via ORAL
  Filled 2018-04-07 (×3): qty 2

## 2018-04-07 MED ORDER — ISOSORBIDE MONONITRATE ER 30 MG PO TB24
30.0000 mg | ORAL_TABLET | Freq: Every day | ORAL | Status: DC
  Administered 2018-04-07 – 2018-04-08 (×2): 30 mg via ORAL
  Filled 2018-04-07 (×3): qty 1

## 2018-04-07 MED ORDER — FUROSEMIDE 40 MG PO TABS
40.0000 mg | ORAL_TABLET | Freq: Every day | ORAL | Status: DC
  Administered 2018-04-08: 40 mg via ORAL
  Filled 2018-04-07 (×2): qty 1

## 2018-04-07 MED ORDER — FENTANYL CITRATE (PF) 100 MCG/2ML IJ SOLN
INTRAMUSCULAR | Status: DC | PRN
  Administered 2018-04-07 (×2): 25 ug via INTRAVENOUS

## 2018-04-07 MED ORDER — ONDANSETRON HCL 4 MG/2ML IJ SOLN: 4.0000 mg | Freq: Four times a day (QID) | INTRAMUSCULAR | Status: DC | PRN

## 2018-04-07 MED ORDER — NIACIN 500 MG PO TABS
500.0000 mg | ORAL_TABLET | Freq: Every day | ORAL | Status: DC
  Administered 2018-04-07: 500 mg via ORAL
  Filled 2018-04-07 (×2): qty 1

## 2018-04-07 MED ORDER — MIDAZOLAM HCL 2 MG/2ML IJ SOLN
INTRAMUSCULAR | Status: DC | PRN
  Administered 2018-04-07 (×2): 1 mg via INTRAVENOUS

## 2018-04-07 MED ORDER — SODIUM CHLORIDE 0.9% FLUSH: 3.0000 mL | Freq: Two times a day (BID) | INTRAVENOUS | Status: DC

## 2018-04-07 MED ORDER — ZOLPIDEM TARTRATE 5 MG PO TABS: 5.0000 mg | ORAL_TABLET | Freq: Every evening | ORAL | Status: DC | PRN

## 2018-04-07 MED ORDER — SODIUM CHLORIDE 0.9% FLUSH: 3.0000 mL | INTRAVENOUS | Status: DC | PRN

## 2018-04-07 MED ORDER — SODIUM CHLORIDE 0.9 % WEIGHT BASED INFUSION
1.0000 mL/kg/h | INTRAVENOUS | Status: DC
  Administered 2018-04-07: 1 mL/kg/h via INTRAVENOUS

## 2018-04-07 MED ORDER — ASPIRIN 81 MG PO CHEW
81.0000 mg | CHEWABLE_TABLET | Freq: Every day | ORAL | Status: DC
  Administered 2018-04-08: 81 mg via ORAL
  Filled 2018-04-07: qty 1

## 2018-04-07 MED ORDER — HEPARIN (PORCINE) IN NACL 1000-0.9 UT/500ML-% IV SOLN
INTRAVENOUS | Status: AC
  Filled 2018-04-07: qty 1000

## 2018-04-07 MED ORDER — METOPROLOL SUCCINATE ER 100 MG PO TB24
200.0000 mg | ORAL_TABLET | Freq: Every day | ORAL | Status: DC
  Administered 2018-04-08: 200 mg via ORAL
  Filled 2018-04-07 (×2): qty 2

## 2018-04-07 MED ORDER — NITROGLYCERIN 5 MG/ML IV SOLN
INTRAVENOUS | Status: AC
  Filled 2018-04-07: qty 10

## 2018-04-07 MED ORDER — NITROGLYCERIN IN D5W 200-5 MCG/ML-% IV SOLN: 0.0000 ug/min | INTRAVENOUS | Status: DC

## 2018-04-07 MED ORDER — BIVALIRUDIN BOLUS VIA INFUSION - CUPID
INTRAVENOUS | Status: DC | PRN
  Administered 2018-04-07: 59.85 mg via INTRAVENOUS

## 2018-04-07 MED ORDER — LOSARTAN POTASSIUM 50 MG PO TABS
100.0000 mg | ORAL_TABLET | Freq: Every day | ORAL | Status: DC
  Administered 2018-04-08: 100 mg via ORAL
  Filled 2018-04-07 (×2): qty 2

## 2018-04-07 MED ORDER — SODIUM CHLORIDE 0.9% FLUSH
3.0000 mL | Freq: Two times a day (BID) | INTRAVENOUS | Status: DC
  Administered 2018-04-08: 3 mL via INTRAVENOUS

## 2018-04-07 MED ORDER — IOPAMIDOL (ISOVUE-300) INJECTION 61%
INTRAVENOUS | Status: DC | PRN
  Administered 2018-04-07: 250 mL via INTRA_ARTERIAL

## 2018-04-07 MED ORDER — OXYCODONE HCL 5 MG PO TABS: 5.0000 mg | ORAL_TABLET | ORAL | Status: DC | PRN

## 2018-04-07 SURGICAL SUPPLY — 21 items
BALLN TREK RX 2.5X12 (BALLOONS) ×3
BALLOON TREK RX 2.5X12 (BALLOONS) IMPLANT
CATH INFINITI 5FR ANG PIGTAIL (CATHETERS) ×2 IMPLANT
CATH INFINITI 5FR JL4 (CATHETERS) ×2 IMPLANT
CATH INFINITI JR4 5F (CATHETERS) ×2 IMPLANT
CATH SWANZ 7F THERMO (CATHETERS) ×2 IMPLANT
CATH VISTA GUIDE 6FR JR4 SH (CATHETERS) ×2 IMPLANT
DEVICE CLOSURE MYNXGRIP 6/7F (Vascular Products) ×4 IMPLANT
DEVICE INFLAT 30 PLUS (MISCELLANEOUS) ×2 IMPLANT
GUIDEWIRE EMER 3M J .025X150CM (WIRE) ×2 IMPLANT
KIT MANI 3VAL PERCEP (MISCELLANEOUS) ×3 IMPLANT
KIT RIGHT HEART (MISCELLANEOUS) ×3 IMPLANT
NDL PERC 18GX7CM (NEEDLE) IMPLANT
NEEDLE PERC 18GX7CM (NEEDLE) ×3 IMPLANT
PACK CARDIAC CATH (CUSTOM PROCEDURE TRAY) ×3 IMPLANT
SHEATH AVANTI 5FR X 11CM (SHEATH) ×2 IMPLANT
SHEATH AVANTI 6FR X 11CM (SHEATH) ×2 IMPLANT
SHEATH AVANTI 7FRX11 (SHEATH) ×2 IMPLANT
STENT RESOLUTE ONYX 2.5X18 (Permanent Stent) ×2 IMPLANT
WIRE G HI TQ BMW 190 (WIRE) ×2 IMPLANT
WIRE GUIDERIGHT .035X150 (WIRE) ×2 IMPLANT

## 2018-04-08 ENCOUNTER — Ambulatory Visit: Payer: Medicare Other | Admitting: Physician Assistant

## 2018-04-08 ENCOUNTER — Encounter: Payer: Self-pay | Admitting: Internal Medicine

## 2018-04-08 DIAGNOSIS — I252 Old myocardial infarction: Secondary | ICD-10-CM | POA: Diagnosis not present

## 2018-04-08 DIAGNOSIS — E782 Mixed hyperlipidemia: Secondary | ICD-10-CM | POA: Diagnosis not present

## 2018-04-08 DIAGNOSIS — I2510 Atherosclerotic heart disease of native coronary artery without angina pectoris: Secondary | ICD-10-CM | POA: Diagnosis not present

## 2018-04-08 DIAGNOSIS — R000 Tachycardia, unspecified: Secondary | ICD-10-CM | POA: Insufficient documentation

## 2018-04-08 DIAGNOSIS — I509 Heart failure, unspecified: Secondary | ICD-10-CM | POA: Diagnosis not present

## 2018-04-08 DIAGNOSIS — I110 Hypertensive heart disease with heart failure: Secondary | ICD-10-CM | POA: Diagnosis not present

## 2018-04-08 DIAGNOSIS — J449 Chronic obstructive pulmonary disease, unspecified: Secondary | ICD-10-CM | POA: Diagnosis not present

## 2018-04-08 NOTE — Progress Notes (Signed)
Cardiac and Pulmonary Nurse Navigator Note:   72 y.o. female with PMHx:   1. Congestive heart failure, unspecified HF chronicity, unspecified heart failure type (CMS-HCC)  2. SOB (shortness of breath)  3. AP (angina pectoris) (CMS-HCC)  4. Essential hypertension  5. Coronary artery disease involving native coronary artery of native heart without angina pectoris  6. Mixed hyperlipidemia  7. Palpitations  8. Chronic combined systolic and diastolic congestive heart failure (CMS-HCC)  9. COPD, mild , unspecified (CMS-HCC)  10. Gastroesophageal reflux disease, esophagitis presence not specified  11. Tachycardia, unspecified  12. History of breast cancer in female  79. CAD in native artery  14. Hyperlipidemia, unspecified hyperlipidemia type  15. Mild obesity, unspecified    Patient presented for outpatient right and left cardiac catheterization on Thursday, Apr 07, 2018.     Callwood, Dwayne D, MD (Primary)    Procedures   CORONARY STENT INTERVENTION  RIGHT/LEFT HEART CATH AND CORONARY ANGIOGRAPHY  Conclusion     Prox RCA to Dist RCA lesion is 25% stenosed.  Dist RCA lesion is 80% stenosed.  Previously placed Ost LAD to Prox LAD stent (unknown type) is widely patent.  Mid LAD lesion is 50% stenosed.  A drug-eluting stent was successfully placed using a STENT RESOLUTE ONYX 2.5X18.  Post intervention, there is a 0% residual stenosis.  Post intervention, there is a 0% residual stenosis.  There is mild left ventricular systolic dysfunction.  LV end diastolic pressure is mildly elevated.  The left ventricular ejection fraction is 50-55% by visual estimate.  Hemodynamic findings consistent with mild pulmonary hypertension.   Conclusion Right heart cath with mildly elevated right heart pressures mean PA was 33 wedge of 13 Left heart cath with preserved left ventricular function around 50% with apical hypo- Left coronary system was relatively few disease with patent LAD  stent and no significant disease in circumflex RCA moderate in size patent extensive stent proximal to distal but if distal 80% lesion prior to the PDA Successful PCI and stent to distal RCA with a 2.5 x 18 mm resolute Lesion was reduced from 80 down to 0%   Rounded on patient to discuss referral to Cardiac Rehab.  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    Patient had no questions about the procedure, stent, or cardiac rehab.  Patient currently exercises.  Patient considering participating in Cardiac Rehab.  Informational letter, brochure, info on CPT billing codes, and class times provided to patient.  Patient plans to call her insurance to check to see what her out-of-pocket expenses will be for Cardiac Rehab.  Patient agreeable to being contacted in about one week by Cardiac Rehab department.    Roanna Epley, RN, BSN, [facility] Cardiovascular and Pulmonary Nurse Navigator

## 2018-04-08 NOTE — Progress Notes (Signed)
Discharge instruction explained to pt/ verbalized an understanding / ivs and tele removed/ transported off unit via wheelchair.

## 2018-04-13 ENCOUNTER — Encounter: Payer: Medicare Other | Admitting: Internal Medicine

## 2018-04-13 DIAGNOSIS — I509 Heart failure, unspecified: Secondary | ICD-10-CM | POA: Diagnosis not present

## 2018-04-13 DIAGNOSIS — L98492 Non-pressure chronic ulcer of skin of other sites with fat layer exposed: Secondary | ICD-10-CM | POA: Diagnosis not present

## 2018-04-13 DIAGNOSIS — T2131XA Burn of third degree of chest wall, initial encounter: Secondary | ICD-10-CM | POA: Diagnosis not present

## 2018-04-13 DIAGNOSIS — I110 Hypertensive heart disease with heart failure: Secondary | ICD-10-CM | POA: Diagnosis not present

## 2018-04-13 DIAGNOSIS — I252 Old myocardial infarction: Secondary | ICD-10-CM | POA: Diagnosis not present

## 2018-04-13 DIAGNOSIS — E11622 Type 2 diabetes mellitus with other skin ulcer: Secondary | ICD-10-CM | POA: Diagnosis not present

## 2018-04-13 DIAGNOSIS — L98499 Non-pressure chronic ulcer of skin of other sites with unspecified severity: Secondary | ICD-10-CM | POA: Diagnosis not present

## 2018-04-14 DIAGNOSIS — I209 Angina pectoris, unspecified: Secondary | ICD-10-CM | POA: Diagnosis not present

## 2018-04-14 DIAGNOSIS — J449 Chronic obstructive pulmonary disease, unspecified: Secondary | ICD-10-CM | POA: Diagnosis not present

## 2018-04-14 DIAGNOSIS — E782 Mixed hyperlipidemia: Secondary | ICD-10-CM | POA: Diagnosis not present

## 2018-04-14 DIAGNOSIS — K219 Gastro-esophageal reflux disease without esophagitis: Secondary | ICD-10-CM | POA: Diagnosis not present

## 2018-04-14 DIAGNOSIS — R002 Palpitations: Secondary | ICD-10-CM | POA: Diagnosis not present

## 2018-04-14 DIAGNOSIS — I2510 Atherosclerotic heart disease of native coronary artery without angina pectoris: Secondary | ICD-10-CM | POA: Diagnosis not present

## 2018-04-14 DIAGNOSIS — I509 Heart failure, unspecified: Secondary | ICD-10-CM | POA: Diagnosis not present

## 2018-04-14 DIAGNOSIS — I5042 Chronic combined systolic (congestive) and diastolic (congestive) heart failure: Secondary | ICD-10-CM | POA: Diagnosis not present

## 2018-04-14 DIAGNOSIS — R000 Tachycardia, unspecified: Secondary | ICD-10-CM | POA: Diagnosis not present

## 2018-04-14 DIAGNOSIS — I10 Essential (primary) hypertension: Secondary | ICD-10-CM | POA: Diagnosis not present

## 2018-04-14 DIAGNOSIS — R0602 Shortness of breath: Secondary | ICD-10-CM | POA: Diagnosis not present

## 2018-04-14 DIAGNOSIS — Z853 Personal history of malignant neoplasm of breast: Secondary | ICD-10-CM | POA: Diagnosis not present

## 2018-04-15 ENCOUNTER — Ambulatory Visit: Payer: Medicare Other | Admitting: Physician Assistant

## 2018-04-15 DIAGNOSIS — Z08 Encounter for follow-up examination after completed treatment for malignant neoplasm: Secondary | ICD-10-CM | POA: Diagnosis not present

## 2018-04-15 DIAGNOSIS — Z85828 Personal history of other malignant neoplasm of skin: Secondary | ICD-10-CM | POA: Diagnosis not present

## 2018-04-15 DIAGNOSIS — L821 Other seborrheic keratosis: Secondary | ICD-10-CM | POA: Diagnosis not present

## 2018-04-15 DIAGNOSIS — D225 Melanocytic nevi of trunk: Secondary | ICD-10-CM | POA: Diagnosis not present
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
## 2018-04-15 NOTE — Progress Notes (Signed)
Stacie Mcdonald, Stacie T. ([id number]) Visit Report for 04/13/2018 Arrival Information Details Patient Name: Stacie Mcdonald, Stacie T. Date of Service: 04/13/2018 1:45 PM Medical Record Number: [id number] Patient Account Number: [id number] Date of Birth/Sex: 07-25-1946 (72 y.o. Female) Treating RN: Stacie Mcdonald Primary Care Stacie Mcdonald: Stacie Mcdonald Other Clinician: Referring Stacie Mcdonald: Stacie Mcdonald Treating Moneisha Vosler/Extender: Stacie Mcdonald in Treatment: 10 Visit Information History Since Last Visit All ordered tests and consults were completed: No Patient Arrived: Ambulatory Added or deleted any medications: No Arrival Time: 13:53 Any new allergies or adverse reactions: No Accompanied By: self Had a fall or experienced change in No Transfer Assistance: None activities of daily living that may affect Patient Identification Verified: Yes risk of falls: Patient Requires Transmission-Based No Signs or symptoms of abuse/neglect since last visito No Precautions: Hospitalized since last visit: Yes Patient Has Alerts: No Implantable device outside of the clinic excluding No cellular tissue based products placed in the center since last visit: Pain Present Now: No Electronic Signature(s) Signed: 04/13/2018 4:15:53 PM By: Stacie Mcdonald Entered By: Stacie Mcdonald on 04/13/2018 13:54:55 Stacie Mcdonald, Stacie Mcdonald ([id number]) -------------------------------------------------------------------------------- Clinic Level of Care Assessment Details Patient Name: Stacie Mcdonald, Stacie T. Date of Service: 04/13/2018 1:45 PM Medical Record Number: [id number] Patient Account Number: [id number] Date of Birth/Sex: [birth year] (72 y.o. Female) Treating RN: Stacie Mcdonald Primary Care Stacie Mcdonald: Stacie Mcdonald Other Clinician: Referring Stacie Mcdonald: Stacie Mcdonald Treating Stacie Mcdonald/Extender: Stacie Mcdonald in Treatment: 10 Clinic Level of Care Assessment Items TOOL 4 Quantity Score []  - Use when only an EandM is  performed on FOLLOW-UP visit 0 ASSESSMENTS - Nursing Assessment / Reassessment []  - Reassessment of Co-morbidities (includes updates in patient status) 0 X- 1 5 Reassessment of Adherence to Treatment Plan ASSESSMENTS - Wound and Skin Assessment / Reassessment X - Simple Wound Assessment / Reassessment - one wound 1 5 []  - 0 Complex Wound Assessment / Reassessment - multiple wounds []  - 0 Dermatologic / Skin Assessment (not related to wound area) ASSESSMENTS - Focused Assessment []  - Circumferential Edema Measurements - multi extremities 0 []  - 0 Nutritional Assessment / Counseling / Intervention []  - 0 Lower Extremity Assessment (monofilament, tuning fork, pulses) []  - 0 Peripheral Arterial Disease Assessment (using hand held doppler) ASSESSMENTS - Ostomy and/or Continence Assessment and Care []  - Incontinence Assessment and Management 0 []  - 0 Ostomy Care Assessment and Management (repouching, etc.) PROCESS - Coordination of Care X - Simple Patient / Family Education for ongoing care 1 15 []  - 0 Complex (extensive) Patient / Family Education for ongoing care []  - 0 Staff obtains Programmer, systems, Records, Test Results / Process Orders []  - 0 Staff telephones HHA, Nursing Homes / Clarify orders / etc []  - 0 Routine Transfer to another Facility (non-emergent condition) []  - 0 Routine Hospital Admission (non-emergent condition) []  - 0 New Admissions / Biomedical engineer / Ordering NPWT, Apligraf, etc. []  - 0 Emergency Hospital Admission (emergent condition) X- 1 10 Simple Discharge Coordination Stacie Mcdonald, Stacie T. ([id number]) []  - 0 Complex (extensive) Discharge Coordination PROCESS - Special Needs []  - Pediatric / Minor Patient Management 0 []  - 0 Isolation Patient Management []  - 0 Hearing / Language / Visual special needs []  - 0 Assessment of Community assistance (transportation, D/C planning, etc.) []  - 0 Additional assistance / Altered mentation []  - 0 Support  Surface(s) Assessment (bed, cushion, seat, etc.) INTERVENTIONS - Wound Cleansing / Measurement []  - Simple Wound Cleansing - one wound 0 []  - 0 Complex Wound Cleansing - multiple wounds []  -  0 Wound Imaging (photographs - any number of wounds) []  - 0 Wound Tracing (instead of photographs) []  - 0 Simple Wound Measurement - one wound []  - 0 Complex Wound Measurement - multiple wounds INTERVENTIONS - Wound Dressings []  - Small Wound Dressing one or multiple wounds 0 []  - 0 Medium Wound Dressing one or multiple wounds []  - 0 Large Wound Dressing one or multiple wounds []  - 0 Application of Medications - topical []  - 0 Application of Medications - injection INTERVENTIONS - Miscellaneous []  - External ear exam 0 []  - 0 Specimen Collection (cultures, biopsies, blood, body fluids, etc.) []  - 0 Specimen(s) / Culture(s) sent or taken to Lab for analysis []  - 0 Patient Transfer (multiple staff / Civil Service fast streamer / Similar devices) []  - 0 Simple Staple / Suture removal (25 or less) []  - 0 Complex Staple / Suture removal (26 or more) []  - 0 Hypo / Hyperglycemic Management (close monitor of Blood Glucose) []  - 0 Ankle / Brachial Index (ABI) - do not check if billed separately X- 1 5 Vital Signs Stacie Mcdonald, Stacie T. ([id number]) Has the patient been seen at the hospital within the last three years: Yes Total Score: 40 Level Of Care: New/Established - Level 2 Electronic Signature(s) Signed: 04/13/2018 5:21:30 PM By: Stacie Mcdonald, BSN, RN, CWS, Stacie Mcdonald Entered By: Stacie Mcdonald, BSN, RN, CWS, Stacie on 04/13/2018 14:10:33 Stacie Mcdonald, Stacie Mcdonald ([id number]) -------------------------------------------------------------------------------- Lower Extremity Assessment Details Patient Name: Stacie Mcdonald, Stacie T. Date of Service: 04/13/2018 1:45 PM Medical Record Number: [id number] Patient Account Number: [id number] Date of Birth/Sex: 1946/04/05 (72 y.o. Female) Treating RN: Stacie Mcdonald Primary Care Stacie Mcdonald: Stacie Mcdonald Other Clinician: Referring Daniyla Pfahler: Stacie Mcdonald Treating Stacie Mcdonald/Extender: Ricard Dillon Weeks in Treatment: 10 Electronic Signature(s) Signed: 04/13/2018 4:15:53 PM By: Stacie Mcdonald Entered By: Stacie Mcdonald on 04/13/2018 14:01:28 Stacie Mcdonald, Stacie Mcdonald ([id number]) -------------------------------------------------------------------------------- Multi Wound Chart Details Patient Name: Stacie Mcdonald, Stacie T. Date of Service: 04/13/2018 1:45 PM Medical Record Number: [id number] Patient Account Number: [id number] Date of Birth/Sex: 08-Apr-1946 (73 y.o. Female) Treating RN: Stacie Mcdonald Primary Care Evelin Cake: Stacie Mcdonald Other Clinician: Referring Westlynn Fifer: Stacie Mcdonald Treating Shakaya Bhullar/Extender: Stacie Mcdonald in Treatment: 10 Vital Signs Height(in): 64 Pulse(bpm): 58 Weight(lbs): 183 Blood Pressure(mmHg): 138/58 Body Mass Index(BMI): 31 Temperature(F): 98.9 Respiratory Rate 18 (breaths/min): Photos: [1:No Photos] [N/A:N/A] Wound Location: [1:Right Breast] [N/A:N/A] Wounding Event: [1:Thermal Burn] [N/A:N/A] Primary Etiology: [1:3rd degree Burn] [N/A:N/A] Comorbid History: [1:Arrhythmia, Congestive Heart Failure, Coronary Artery Disease, Hypertension, Myocardial Infarction, Osteoarthritis, Received Chemotherapy, Received Radiation] [N/A:N/A] Date Acquired: [1:01/14/2018] [N/A:N/A] Weeks of Treatment: [1:10] [N/A:N/A] Wound Status: [1:Healed - Epithelialized] [N/A:N/A] Measurements L x W x D [1:0x0x0] [N/A:N/A] (cm) Area (cm) : [1:0] [N/A:N/A] Volume (cm) : [1:0] [N/A:N/A] % Reduction in Area: [1:100.00%] [N/A:N/A] % Reduction in Volume: [1:100.00%] [N/A:N/A] Classification: [1:Full Thickness Without Exposed Support Structures] [N/A:N/A] Exudate Amount: [1:None Present] [N/A:N/A] Wound Margin: [1:Distinct, outline attached] [N/A:N/A] Granulation Amount: [1:None Present (0%)] [N/A:N/A] Necrotic Amount: [1:Small (1-33%)] [N/A:N/A] Necrotic Tissue:  [1:Eschar] [N/A:N/A] Exposed Structures: [1:Fascia: No Fat Layer (Subcutaneous Tissue) Exposed: No Tendon: No Muscle: No Joint: No Bone: No] [N/A:N/A] Epithelialization: [1:Large (67-100%)] [N/A:N/A] Periwound Skin Texture: [1:Excoriation: No Induration: No] [N/A:N/A] Callus: No Crepitus: No Rash: No Scarring: No Periwound Skin Moisture: Maceration: No N/A N/A Dry/Scaly: No Periwound Skin Color: Atrophie Blanche: No N/A N/A Cyanosis: No Ecchymosis: No Erythema: No Hemosiderin Staining: No Mottled: No Pallor: No Rubor: No Temperature: No Abnormality N/A N/A Tenderness on Palpation: No N/A N/A Wound Preparation: Ulcer Cleansing: N/A N/A  Rinsed/Irrigated with Saline Topical Anesthetic Applied: None Treatment Notes Electronic Signature(s) Signed: 04/14/2018 8:24:56 AM By: Linton Ham MD Entered By: Linton Ham on 04/13/2018 14:10:36 Stacie Mcdonald, Stacie Mcdonald ([id number]) -------------------------------------------------------------------------------- Multi-Disciplinary Care Plan Details Patient Name: Stacie Mcdonald, Stacie T. Date of Service: 04/13/2018 1:45 PM Medical Record Number: [id number] Patient Account Number: [id number] Date of Birth/Sex: 02/24/1946 (72 y.o. Female) Treating RN: Stacie Mcdonald Primary Care Therasa Lorenzi: Stacie Mcdonald Other Clinician: Referring Deandra Goering: Stacie Mcdonald Treating Savino Whisenant/Extender: Stacie Mcdonald in Treatment: 10 Active Inactive Electronic Signature(s) Signed: 04/13/2018 5:21:30 PM By: Stacie Mcdonald, BSN, RN, CWS, Stacie Mcdonald Entered By: Stacie Mcdonald, BSN, RN, CWS, Stacie on 04/13/2018 14:07:56 Stacie Mcdonald, Stacie Mcdonald ([id number]) -------------------------------------------------------------------------------- Pain Assessment Details Patient Name: Stacie Mcdonald, Stacie T. Date of Service: 04/13/2018 1:45 PM Medical Record Number: [id number] Patient Account Number: [id number] Date of Birth/Sex: 07/19/1946 (72 y.o. Female) Treating RN: Stacie Mcdonald Primary Care Vieva Brummitt:  Stacie Mcdonald Other Clinician: Referring Jakhari Space: Stacie Mcdonald Treating Milaya Hora/Extender: Stacie Mcdonald in Treatment: 10 Active Problems Location of Pain Severity and Description of Pain Patient Has Paino No Site Locations Pain Management and Medication Current Pain Management: Electronic Signature(s) Signed: 04/13/2018 4:15:53 PM By: Stacie Mcdonald Entered By: Stacie Mcdonald on 04/13/2018 13:55:11 Stacie Mcdonald, Stacie Mcdonald ([id number]) -------------------------------------------------------------------------------- Wound Assessment Details Patient Name: Stacie Mcdonald, Stacie T. Date of Service: 04/13/2018 1:45 PM Medical Record Number: [id number] Patient Account Number: [id number] Date of Birth/Sex: 1946-01-05 (72 y.o. Female) Treating RN: Stacie Mcdonald Primary Care Aaran Enberg: Stacie Mcdonald Other Clinician: Referring Kamira Mellette: Stacie Mcdonald Treating Michala Deblanc/Extender: Stacie Mcdonald in Treatment: 10 Wound Status Wound Number: 1 Primary 3rd degree Burn Etiology: Wound Location: Right Breast Wound Healed - Epithelialized Wounding Event: Thermal Burn Status: Date Acquired: 01/14/2018 Comorbid Arrhythmia, Congestive Heart Failure, Coronary Weeks Of Treatment: 10 History: Artery Disease, Hypertension, Myocardial Clustered Wound: No Infarction, Osteoarthritis, Received Chemotherapy, Received Radiation Photos Photo Uploaded By: Stacie Mcdonald on 04/13/2018 16:21:30 Wound Measurements Length: (cm) 0 Width: (cm) 0 Depth: (cm) 0 Area: (cm) 0 Volume: (cm) 0 % Reduction in Area: 100% % Reduction in Volume: 100% Epithelialization: Large (67-100%) Tunneling: No Undermining: No Wound Description Full Thickness Without Exposed Support Classification: Structures Wound Margin: Distinct, outline attached Exudate None Present Amount: Foul Odor After Cleansing: No Slough/Fibrino No Wound Bed Granulation Amount: None Present (0%) Exposed Structure Necrotic Amount:  Small (1-33%) Fascia Exposed: No Necrotic Quality: Eschar Fat Layer (Subcutaneous Tissue) Exposed: No Tendon Exposed: No Muscle Exposed: No Joint Exposed: No Bone Exposed: No Stacie Mcdonald, Stacie T. ([id number]) Periwound Skin Texture Texture Color No Abnormalities Noted: No No Abnormalities Noted: No Callus: No Atrophie Blanche: No Crepitus: No Cyanosis: No Excoriation: No Ecchymosis: No Induration: No Erythema: No Rash: No Hemosiderin Staining: No Scarring: No Mottled: No Pallor: No Moisture Rubor: No No Abnormalities Noted: No Dry / Scaly: No Temperature / Pain Maceration: No Temperature: No Abnormality Wound Preparation Ulcer Cleansing: Rinsed/Irrigated with Saline Topical Anesthetic Applied: None Electronic Signature(s) Signed: 04/13/2018 5:21:30 PM By: Stacie Mcdonald, BSN, RN, CWS, Stacie Mcdonald Entered By: Stacie Mcdonald, BSN, RN, CWS, Stacie on 04/13/2018 14:09:03 Stacie Mcdonald, Stacie Mcdonald ([id number]) -------------------------------------------------------------------------------- [place] Details Patient Name: Stacie Mcdonald, Stacie T. Date of Service: 04/13/2018 1:45 PM Medical Record Number: [id number] Patient Account Number: [id number] Date of Birth/Sex: 02-25-1946 (72 y.o. Female) Treating RN: Stacie Mcdonald Primary Care Khalilah Hoke: Stacie Mcdonald Other Clinician: Referring Rondia Higginbotham: Stacie Mcdonald Treating Makih Stefanko/Extender: Stacie Mcdonald in Treatment: 10 Vital Signs Time Taken: 13:55 Temperature (F): 98.9 Height (in): 64 Pulse (bpm): 84 Weight (lbs): 183 Respiratory Rate (breaths/min): 18  Body Mass Index (BMI): 31.4 Blood Pressure (mmHg): 138/58 Reference Range: 80 - 120 mg / dl Electronic Signature(s) Signed: 04/13/2018 4:15:53 PM By: Stacie Mcdonald Entered By: Stacie Mcdonald on 04/13/2018 13:57:16

## 2018-04-20 ENCOUNTER — Ambulatory Visit
Admission: RE | Admit: 2018-04-20 | Discharge: 2018-04-20 | Disposition: A | Discharge status: Home / Self Care | Payer: Medicare Other | Source: Ambulatory Visit | Attending: Internal Medicine | Admitting: Internal Medicine

## 2018-04-20 DIAGNOSIS — I517 Cardiomegaly: Secondary | ICD-10-CM | POA: Insufficient documentation

## 2018-04-20 DIAGNOSIS — I700 Atherosclerosis of aorta: Secondary | ICD-10-CM | POA: Diagnosis not present

## 2018-04-20 DIAGNOSIS — R918 Other nonspecific abnormal finding of lung field: Secondary | ICD-10-CM | POA: Insufficient documentation

## 2018-04-20 DIAGNOSIS — I2510 Atherosclerotic heart disease of native coronary artery without angina pectoris: Secondary | ICD-10-CM | POA: Diagnosis not present

## 2018-05-12 ENCOUNTER — Encounter: Payer: Medicare Other | Attending: Internal Medicine | Admitting: *Deleted

## 2018-05-12 VITALS — Ht 63.0 in | Wt 178.2 lb

## 2018-05-12 DIAGNOSIS — Z955 Presence of coronary angioplasty implant and graft: Secondary | ICD-10-CM | POA: Diagnosis not present

## 2018-05-12 DIAGNOSIS — Z48812 Encounter for surgical aftercare following surgery on the circulatory system: Secondary | ICD-10-CM | POA: Insufficient documentation

## 2018-05-12 NOTE — Patient Instructions (Signed)
Patient Instructions  Patient Details  Name: Stacie Mcdonald MRN: [id number] Date of Birth: Dec 16, 1945 Referring Provider:  Yolonda Kida, MD  Below are your personal goals for exercise, nutrition, and risk factors. Our goal is to help you stay on track towards obtaining and maintaining these goals. We will be discussing your progress on these goals with you throughout the program.  Initial Exercise Prescription: Initial Exercise Prescription - 05/12/18 1300      Date of Initial Exercise RX and Referring Provider   Date  05/12/18    Referring Provider  [facility]      Treadmill   MPH  2    Grade  0    Minutes  15    METs  2.5      NuStep   Level  2    SPM  80    Minutes  15    METs  2.6      REL-XR   Level  2    Speed  50    Minutes  15    METs  2.5      Prescription Details   Frequency (times per week)  2    Duration  Progress to 30 minutes of continuous aerobic without signs/symptoms of physical distress      Intensity   THRR 40-80% of Max Heartrate  115-136    Ratings of Perceived Exertion  11-13    Perceived Dyspnea  0-4      Resistance Training   Training Prescription  Yes    Weight  2 lb    Reps  10-15       Exercise Goals: Frequency: Be able to perform aerobic exercise two to three times per week in program working toward 2-5 days per week of home exercise.  Intensity: Work with a perceived exertion of 11 (fairly light) - 15 (hard) while following your exercise prescription.  We will make changes to your prescription with you as you progress through the program.   Duration: Be able to do 30 to 45 minutes of continuous aerobic exercise in addition to a 5 minute warm-up and a 5 minute cool-down routine.   Nutrition Goals: Your personal nutrition goals will be established when you do your nutrition analysis with the dietician.  The following are general nutrition guidelines to follow: Cholesterol < 200mg /day Sodium < 1500mg /day Fiber: Women over 50  yrs - 21 grams per day  Personal Goals: Personal Goals and Risk Factors at Admission - 05/12/18 1356      Core Components/Risk Factors/Patient Goals on Admission   Improve shortness of breath with ADL's  Yes    Intervention  Provide education, individualized exercise plan and daily activity instruction to help decrease symptoms of SOB with activities of daily living.    Expected Outcomes  Short Term: Improve cardiorespiratory fitness to achieve a reduction of symptoms when performing ADLs;Long Term: Be able to perform more ADLs without symptoms or delay the onset of symptoms    Heart Failure  Yes History of heart failure in the past    Intervention  Provide a combined exercise and nutrition program that is supplemented with education, support and counseling about heart failure. Directed toward relieving symptoms such as shortness of breath, decreased exercise tolerance, and extremity edema.    Expected Outcomes  Improve functional capacity of life;Short term: Attendance in program 2-3 days a week with increased exercise capacity. Reported lower sodium intake. Reported increased fruit and vegetable intake. Reports medication compliance.;Short term: Daily  weights obtained and reported for increase. Utilizing diuretic protocols set by physician.;Long term: Adoption of self-care skills and reduction of barriers for early signs and symptoms recognition and intervention leading to self-care maintenance.    Lipids  Yes    Intervention  Provide education and support for participant on nutrition & aerobic/resistive exercise along with prescribed medications to achieve LDL 70mg , HDL >40mg .    Expected Outcomes  Short Term: Participant states understanding of desired cholesterol values and is compliant with medications prescribed. Participant is following exercise prescription and nutrition guidelines.;Long Term: Cholesterol controlled with medications as prescribed, with individualized exercise RX and with  personalized nutrition plan. Value goals: LDL < 70mg , HDL > 40 mg.       Tobacco Use Initial Evaluation: Social History   Tobacco Use  Smoking Status Never Smoker  Smokeless Tobacco Never Used    Exercise Goals and Review: Exercise Goals    Row Name 05/12/18 1343             Exercise Goals   Increase Physical Activity  Yes       Intervention  Provide advice, education, support and counseling about physical activity/exercise needs.;Develop an individualized exercise prescription for aerobic and resistive training based on initial evaluation findings, risk stratification, comorbidities and participant's personal goals.       Expected Outcomes  Short Term: Attend rehab on a regular basis to increase amount of physical activity.;Long Term: Add in home exercise to make exercise part of routine and to increase amount of physical activity.;Long Term: Exercising regularly at least 3-5 days a week.       Increase Strength and Stamina  Yes       Intervention  Provide advice, education, support and counseling about physical activity/exercise needs.;Develop an individualized exercise prescription for aerobic and resistive training based on initial evaluation findings, risk stratification, comorbidities and participant's personal goals.       Expected Outcomes  Short Term: Increase workloads from initial exercise prescription for resistance, speed, and METs.;Short Term: Perform resistance training exercises routinely during rehab and add in resistance training at home;Long Term: Improve cardiorespiratory fitness, muscular endurance and strength as measured by increased METs and functional capacity (6MWT)       Able to understand and use rate of perceived exertion (RPE) scale  Yes       Intervention  Provide education and explanation on how to use RPE scale       Expected Outcomes  Short Term: Able to use RPE daily in rehab to express subjective intensity level;Long Term:  Able to use RPE to guide  intensity level when exercising independently       Able to understand and use Dyspnea scale  Yes       Intervention  Provide education and explanation on how to use Dyspnea scale       Expected Outcomes  Short Term: Able to use Dyspnea scale daily in rehab to express subjective sense of shortness of breath during exertion;Long Term: Able to use Dyspnea scale to guide intensity level when exercising independently       Knowledge and understanding of Target Heart Rate Range (THRR)  Yes       Intervention  Provide education and explanation of THRR including how the numbers were predicted and where they are located for reference       Expected Outcomes  Short Term: Able to state/look up THRR;Short Term: Able to use daily as guideline for intensity in rehab;Long Term: Able to  use THRR to govern intensity when exercising independently       Able to check pulse independently  Yes       Intervention  Provide education and demonstration on how to check pulse in carotid and radial arteries.;Review the importance of being able to check your own pulse for safety during independent exercise       Expected Outcomes  Short Term: Able to explain why pulse checking is important during independent exercise;Long Term: Able to check pulse independently and accurately       Understanding of Exercise Prescription  Yes       Intervention  Provide education, explanation, and written materials on patient's individual exercise prescription       Expected Outcomes  Short Term: Able to explain program exercise prescription;Long Term: Able to explain home exercise prescription to exercise independently          Copy of goals given to participant.

## 2018-05-12 NOTE — Progress Notes (Signed)
Cardiac Individual Treatment Plan  Patient Details  Name: Almetta T Martinique MRN: [id number] Date of Birth: 1946/08/17 Referring Provider:     Cardiac Rehab from 05/12/2018 in [facility] Cardiac and Pulmonary Rehab  Referring Provider  [facility]      Initial Encounter Date:    Cardiac Rehab from 05/12/2018 in [facility] Cardiac and Pulmonary Rehab  Date  05/12/18      Visit Diagnosis: Status post coronary artery stent placement  Patient's Home Medications on Admission:  Current Outpatient Medications:  .  aspirin 81 MG tablet, Take 81 mg by mouth daily. , Disp: , Rfl:  .  clopidogrel (PLAVIX) 75 MG tablet, TAKE ONE TABLET BY MOUTH DAILY, Disp: 90 tablet, Rfl: 1 .  digoxin (LANOXIN) 0.125 MG tablet, Take 0.125 mg by mouth daily., Disp: , Rfl:  .  furosemide (LASIX) 40 MG tablet, Take 40 mg by mouth daily., Disp: , Rfl:  .  GLUCOSAMINE-CHONDROITIN PO, Take 2 tablets by mouth daily., Disp: , Rfl:  .  isosorbide mononitrate (IMDUR) 60 MG 24 hr tablet, Take 60 mg by mouth daily., Disp: , Rfl:  .  loratadine (CLARITIN) 10 MG tablet, Take 10 mg by mouth daily as needed for allergies., Disp: , Rfl:  .  losartan (COZAAR) 100 MG tablet, TAKE 1 TABLET (100 MG TOTAL) BY MOUTH DAILY., Disp: 90 tablet, Rfl: 1 .  metoprolol (TOPROL-XL) 200 MG 24 hr tablet, TAKE 1 TABLET BY MOUTH DAILY., Disp: 90 tablet, Rfl: 1 .  niacin 500 MG tablet, Take 1 tablet (500 mg total) by mouth daily., Disp: 90 tablet, Rfl: 1 .  omeprazole (PRILOSEC) 20 MG capsule, Take 1 capsule (20 mg total) by mouth daily. (Patient taking differently: Take 20 mg by mouth daily as needed (acid reflux). ), Disp: 90 capsule, Rfl: 1 .  potassium chloride SA (K-DUR,KLOR-CON) 20 MEQ tablet, Take 1 tablet (20 mEq total) by mouth daily., Disp: 30 tablet, Rfl: 3 .  Lancets (FREESTYLE) lancets, uSE TO CHECK SUGARS ONCE DAILY e11.9, Disp: 100 each, Rfl: 3 .  ONE TOUCH ULTRA TEST test strip, CHECK DAILY, Disp: 100 each, Rfl: 2  Past Medical History: Past  Medical History:  Diagnosis Date  . Allergy   . Arthritis   . Asthma   . Breast Cancer 1991   R mastectomy. XRT , chemo  . Breast cancer ([place]) 1991   RT MASTECTOMY  . CAD (coronary artery disease) 2008   50% LAD occlusion 2009,  stent in mid LAD 2008 for 95%   . Chicken pox   . Colon polyps April 2009   by Colonoscopy, Gustavo Lah  . GERD (gastroesophageal reflux disease)   . Heart disease   . Hyperlipidemia   . Hypertension   . 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 Rheumatic fever   . Urinary tract infection     Tobacco Use: Social History   Tobacco Use  Smoking Status Never Smoker  Smokeless Tobacco Never Used    Labs: Recent Review Flowsheet Data    Labs for ITP Cardiac and Pulmonary Rehab Latest Ref Rng & Units 02/15/2015 06/15/2016 12/15/2016 06/08/2017 01/20/2018   Cholestrol 0 - 200 mg/dL 198 178 191 193 -   LDLCALC 0 - 99 mg/dL 125(H) 116(H) 117(H) 124(H) -   LDLDIRECT mg/dL - 135.0 133.0 142.0 123.0   HDL >39.00 mg/dL 39.60 36.40(L) 35.60(L) 36.40(L) -   Trlycerides 0.0 - 149.0 mg/dL 168.0(H) 125.0 192.0(H) 163.0(H) -   Hemoglobin A1c 4.6 -  6.5 % 6.7(H) 6.5 6.5 6.3 6.5       Exercise Target Goals: Date: 05/12/18  Exercise Program Goal: Individual exercise prescription set using results from initial 6 min walk test and THRR while considering  patient's activity barriers and safety.   Exercise Prescription Goal: Initial exercise prescription builds to 30-45 minutes a day of aerobic activity, 2-3 days per week.  Home exercise guidelines will be given to patient during program as part of exercise prescription that the participant will acknowledge.  Activity Barriers & Risk Stratification: Activity Barriers & Cardiac Risk Stratification - 05/12/18 1351      Activity Barriers & Cardiac Risk Stratification   Activity Barriers  Arthritis;Shortness of Breath    Cardiac Risk Stratification  High       6 Minute Walk: 6 Minute Walk    Row Name  05/12/18 1343         6 Minute Walk   Distance  1214 feet     Walk Time  6 minutes     # of Rest Breaks  0     MPH  2.3     METS  2.6     RPE  16     Perceived Dyspnea   3     VO2 Peak  9.1     Symptoms  No     Resting HR  95 bpm     Resting BP  142/70     Resting Oxygen Saturation   93 %     Exercise Oxygen Saturation  during 6 min walk  91 %     Max Ex. HR  113 bpm     Max Ex. BP  152/72     2 Minute Post BP  150/72        Oxygen Initial Assessment: Oxygen Initial Assessment - 05/12/18 1350      Home Oxygen   Sleep Oxygen Prescription  None    Home Exercise Oxygen Prescription  None    Home at Rest Exercise Oxygen Prescription  None      Initial 6 min Walk   Oxygen Used  None      Program Oxygen Prescription   Program Oxygen Prescription  None       Oxygen Re-Evaluation:   Oxygen Discharge (Final Oxygen Re-Evaluation):   Initial Exercise Prescription: Initial Exercise Prescription - 05/12/18 1300      Date of Initial Exercise RX and Referring Provider   Date  05/12/18    Referring Provider  [facility]      Treadmill   MPH  2    Grade  0    Minutes  15    METs  2.5      NuStep   Level  2    SPM  80    Minutes  15    METs  2.6      REL-XR   Level  2    Speed  50    Minutes  15    METs  2.5      Prescription Details   Frequency (times per week)  2    Duration  Progress to 30 minutes of continuous aerobic without signs/symptoms of physical distress      Intensity   THRR 40-80% of Max Heartrate  115-136    Ratings of Perceived Exertion  11-13    Perceived Dyspnea  0-4      Resistance Training   Training Prescription  Yes    Weight  2 lb    Reps  10-15       Perform Capillary Blood Glucose checks as needed.  Exercise Prescription Changes:  Exercise Prescription Changes    Row Name 05/12/18 1300             Response to Exercise   Blood Pressure (Admit)  142/70       Blood Pressure (Exercise)  152/70       Blood Pressure  (Exit)  150/72       Heart Rate (Admit)  86 bpm       Heart Rate (Exercise)  110 bpm       Heart Rate (Exit)  113 bpm       Oxygen Saturation (Admit)  93 %       Oxygen Saturation (Exercise)  92 %       Oxygen Saturation (Exit)  93 %       Rating of Perceived Exertion (Exercise)  16          Exercise Comments:   Exercise Goals and Review:  Exercise Goals    Row Name 05/12/18 1343             Exercise Goals   Increase Physical Activity  Yes       Intervention  Provide advice, education, support and counseling about physical activity/exercise needs.;Develop an individualized exercise prescription for aerobic and resistive training based on initial evaluation findings, risk stratification, comorbidities and participant's personal goals.       Expected Outcomes  Short Term: Attend rehab on a regular basis to increase amount of physical activity.;Long Term: Add in home exercise to make exercise part of routine and to increase amount of physical activity.;Long Term: Exercising regularly at least 3-5 days a week.       Increase Strength and Stamina  Yes       Intervention  Provide advice, education, support and counseling about physical activity/exercise needs.;Develop an individualized exercise prescription for aerobic and resistive training based on initial evaluation findings, risk stratification, comorbidities and participant's personal goals.       Expected Outcomes  Short Term: Increase workloads from initial exercise prescription for resistance, speed, and METs.;Short Term: Perform resistance training exercises routinely during rehab and add in resistance training at home;Long Term: Improve cardiorespiratory fitness, muscular endurance and strength as measured by increased METs and functional capacity (6MWT)       Able to understand and use rate of perceived exertion (RPE) scale  Yes       Intervention  Provide education and explanation on how to use RPE scale       Expected Outcomes   Short Term: Able to use RPE daily in rehab to express subjective intensity level;Long Term:  Able to use RPE to guide intensity level when exercising independently       Able to understand and use Dyspnea scale  Yes       Intervention  Provide education and explanation on how to use Dyspnea scale       Expected Outcomes  Short Term: Able to use Dyspnea scale daily in rehab to express subjective sense of shortness of breath during exertion;Long Term: Able to use Dyspnea scale to guide intensity level when exercising independently       Knowledge and understanding of Target Heart Rate Range (THRR)  Yes       Intervention  Provide education and explanation of THRR including how the numbers were predicted and where they are located for  reference       Expected Outcomes  Short Term: Able to state/look up THRR;Short Term: Able to use daily as guideline for intensity in rehab;Long Term: Able to use THRR to govern intensity when exercising independently       Able to check pulse independently  Yes       Intervention  Provide education and demonstration on how to check pulse in carotid and radial arteries.;Review the importance of being able to check your own pulse for safety during independent exercise       Expected Outcomes  Short Term: Able to explain why pulse checking is important during independent exercise;Long Term: Able to check pulse independently and accurately       Understanding of Exercise Prescription  Yes       Intervention  Provide education, explanation, and written materials on patient's individual exercise prescription       Expected Outcomes  Short Term: Able to explain program exercise prescription;Long Term: Able to explain home exercise prescription to exercise independently          Exercise Goals Re-Evaluation :   Discharge Exercise Prescription (Final Exercise Prescription Changes): Exercise Prescription Changes - 05/12/18 1300      Response to Exercise   Blood Pressure  (Admit)  142/70    Blood Pressure (Exercise)  152/70    Blood Pressure (Exit)  150/72    Heart Rate (Admit)  86 bpm    Heart Rate (Exercise)  110 bpm    Heart Rate (Exit)  113 bpm    Oxygen Saturation (Admit)  93 %    Oxygen Saturation (Exercise)  92 %    Oxygen Saturation (Exit)  93 %    Rating of Perceived Exertion (Exercise)  16       Nutrition:  Target Goals: Understanding of nutrition guidelines, daily intake of sodium '1500mg'$ , cholesterol '200mg'$ , calories 30% from fat and 7% or less from saturated fats, daily to have 5 or more servings of fruits and vegetables.  Biometrics: Pre Biometrics - 05/12/18 1342      Pre Biometrics   Height  '5\' 3"'$  (1.6 m)    Weight  178 lb 3.2 oz (80.8 kg)    Waist Circumference  37.5 inches    Hip Circumference  43.25 inches    Waist to Hip Ratio  0.87 %    BMI (Calculated)  31.57    Single Leg Stand  25 seconds        Nutrition Therapy Plan and Nutrition Goals: Nutrition Therapy & Goals - 05/12/18 1355      Nutrition Therapy   RD appointment deferred  Yes    Drug/Food Interactions  Statins/Certain Fruits      Personal Nutrition Goals   Nutrition Goal  To cont to eat heart healthy.      Intervention Plan   Intervention  Prescribe, educate and counsel regarding individualized specific dietary modifications aiming towards targeted core components such as weight, hypertension, lipid management, diabetes, heart failure and other comorbidities.;Nutrition handout(s) given to patient.    Expected Outcomes  Short Term Goal: Understand basic principles of dietary content, such as calories, fat, sodium, cholesterol and nutrients.;Short Term Goal: A plan has been developed with personal nutrition goals set during dietitian appointment.;Long Term Goal: Adherence to prescribed nutrition plan.       Nutrition Assessments: Nutrition Assessments - 05/12/18 1406      MEDFICTS Scores   Pre Score  -- Patient did not want to fill out form  Nutrition Goals Re-Evaluation:   Nutrition Goals Discharge (Final Nutrition Goals Re-Evaluation):   Psychosocial: Target Goals: Acknowledge presence or absence of significant depression and/or stress, maximize coping skills, provide positive support system. Participant is able to verbalize types and ability to use techniques and skills needed for reducing stress and depression.   Initial Review & Psychosocial Screening: Initial Psych Review & Screening - 05/12/18 1358      Initial Review   Current issues with  Current Sleep Concerns      Family Dynamics   Good Support System?  Yes      Barriers   Psychosocial barriers to participate in program  The patient should benefit from training in stress management and relaxation.      Screening Interventions   Interventions  Encouraged to exercise;Program counselor consult    Expected Outcomes  Long Term Goal: Stressors or current issues are controlled or eliminated.;Short Term goal: Utilizing psychosocial counselor, staff and physician to assist with identification of specific Stressors or current issues interfering with healing process. Setting desired goal for each stressor or current issue identified.       Quality of Life Scores:  Quality of Life - 05/12/18 1409      Quality of Life   Select  Quality of Life      Quality of Life Scores   Health/Function Pre  22.87 %    Socioeconomic Pre  24.43 %    Psych/Spiritual Pre  26.79 %    Family Pre  27.6 %    GLOBAL Pre  24.69 %      Scores of 19 and below usually indicate a poorer quality of life in these areas.  A difference of  2-3 points is a clinically meaningful difference.  A difference of 2-3 points in the total score of the Quality of Life Index has been associated with significant improvement in overall quality of life, self-image, physical symptoms, and general health in studies assessing change in quality of life.  PHQ-9: Recent Review Flowsheet Data    Depression  screen [facility] 2/9 05/12/2018 01/06/2018 06/08/2017 06/08/2016 02/11/2015   Decreased Interest 0 0 0 0 0   Down, Depressed, Hopeless 0 0 0 0 0   PHQ - 2 Score 0 0 0 0 0   Altered sleeping 1 - 0 - -   Tired, decreased energy 2 - 0 - -   Change in appetite 0 - 0 - -   Feeling bad or failure about yourself  0 - 0 - -   Trouble concentrating 0 - 0 - -   Moving slowly or fidgety/restless 0 - 0 - -   Suicidal thoughts 0 - 0 - -   PHQ-9 Score 3 - 0 - -   Difficult doing work/chores Somewhat difficult - - - -     Interpretation of Total Score  Total Score Depression Severity:  1-4 = Minimal depression, 5-9 = Mild depression, 10-14 = Moderate depression, 15-19 = Moderately severe depression, 20-27 = Severe depression   Psychosocial Evaluation and Intervention:   Psychosocial Re-Evaluation:   Psychosocial Discharge (Final Psychosocial Re-Evaluation):   Vocational Rehabilitation: Provide vocational rehab assistance to qualifying candidates.   Vocational Rehab Evaluation & Intervention: Vocational Rehab - 05/12/18 1337      Initial Vocational Rehab Evaluation & Intervention   Assessment shows need for Vocational Rehabilitation  No       Education: Education Goals: Education classes will be provided on a variety of topics geared toward better  understanding of heart health and risk factor modification. Participant will state understanding/return demonstration of topics presented as noted by education test scores.  Learning Barriers/Preferences: Learning Barriers/Preferences - 05/12/18 1337      Learning Barriers/Preferences   Learning Barriers  None    Learning Preferences  Written Material       Education Topics:  AED/CPR: - Group verbal and written instruction with the use of models to demonstrate the basic use of the AED with the basic ABC's of resuscitation.   General Nutrition Guidelines/Fats and Fiber: -Group instruction provided by verbal, written material, models and  posters to present the general guidelines for heart healthy nutrition. Gives an explanation and review of dietary fats and fiber.   Controlling Sodium/Reading Food Labels: -Group verbal and written material supporting the discussion of sodium use in heart healthy nutrition. Review and explanation with models, verbal and written materials for utilization of the food label.   Exercise Physiology & General Exercise Guidelines: - Group verbal and written instruction with models to review the exercise physiology of the cardiovascular system and associated critical values. Provides general exercise guidelines with specific guidelines to those with heart or lung disease.    Aerobic Exercise & Resistance Training: - Gives group verbal and written instruction on the various components of exercise. Focuses on aerobic and resistive training programs and the benefits of this training and how to safely progress through these programs..   Flexibility, Balance, Mind/Body Relaxation: Provides group verbal/written instruction on the benefits of flexibility and balance training, including mind/body exercise modes such as yoga, pilates and tai chi.  Demonstration and skill practice provided.   Stress and Anxiety: - Provides group verbal and written instruction about the health risks of elevated stress and causes of high stress.  Discuss the correlation between heart/lung disease and anxiety and treatment options. Review healthy ways to manage with stress and anxiety.   Depression: - Provides group verbal and written instruction on the correlation between heart/lung disease and depressed mood, treatment options, and the stigmas associated with seeking treatment.   Anatomy & Physiology of the Heart: - Group verbal and written instruction and models provide basic cardiac anatomy and physiology, with the coronary electrical and arterial systems. Review of Valvular disease and Heart Failure   Cardiac  Procedures: - Group verbal and written instruction to review commonly prescribed medications for heart disease. Reviews the medication, class of the drug, and side effects. Includes the steps to properly store meds and maintain the prescription regimen. (beta blockers and nitrates)   Cardiac Medications I: - Group verbal and written instruction to review commonly prescribed medications for heart disease. Reviews the medication, class of the drug, and side effects. Includes the steps to properly store meds and maintain the prescription regimen.   Cardiac Medications II: -Group verbal and written instruction to review commonly prescribed medications for heart disease. Reviews the medication, class of the drug, and side effects. (all other drug classes)    Go Sex-Intimacy & Heart Disease, Get SMART - Goal Setting: - Group verbal and written instruction through game format to discuss heart disease and the return to sexual intimacy. Provides group verbal and written material to discuss and apply goal setting through the application of the S.M.A.R.T. Method.   Other Matters of the Heart: - Provides group verbal, written materials and models to describe Stable Angina and Peripheral Artery. Includes description of the disease process and treatment options available to the cardiac patient.   Exercise & Equipment Safety: -  Individual verbal instruction and demonstration of equipment use and safety with use of the equipment.   Cardiac Rehab from 05/12/2018 in [facility] Cardiac and Pulmonary Rehab  Date  05/12/18  Educator  C. Darian Ace,RN  Instruction Review Code  1- Verbalizes Understanding      Infection Prevention: - Provides verbal and written material to individual with discussion of infection control including proper hand washing and proper equipment cleaning during exercise session.   Cardiac Rehab from 05/12/2018 in [facility] Cardiac and Pulmonary Rehab  Date  05/12/18  Educator  [facility]  Instruction  Review Code  1- Verbalizes Understanding      Falls Prevention: - Provides verbal and written material to individual with discussion of falls prevention and safety.   Cardiac Rehab from 05/12/2018 in [facility] Cardiac and Pulmonary Rehab  Date  05/12/18  Educator  C. Luanna Weesner  Instruction Review Code  1- Verbalizes Understanding      Diabetes: - Individual verbal and written instruction to review signs/symptoms of diabetes, desired ranges of glucose level fasting, after meals and with exercise. Acknowledge that pre and post exercise glucose checks will be done for 3 sessions at entry of program.   Know Your Numbers and Risk Factors: -Group verbal and written instruction about important numbers in your health.  Discussion of what are risk factors and how they play a role in the disease process.  Review of Cholesterol, Blood Pressure, Diabetes, and BMI and the role they play in your overall health.   Sleep Hygiene: -Provides group verbal and written instruction about how sleep can affect your health.  Define sleep hygiene, discuss sleep cycles and impact of sleep habits. Review good sleep hygiene tips.    Other: -Provides group and verbal instruction on various topics (see comments)   Knowledge Questionnaire Score: Knowledge Questionnaire Score - 05/12/18 1409      Knowledge Questionnaire Score   Pre Score  26/26       Core Components/Risk Factors/Patient Goals at Admission: Personal Goals and Risk Factors at Admission - 05/12/18 1356      Core Components/Risk Factors/Patient Goals on Admission   Improve shortness of breath with ADL's  Yes    Intervention  Provide education, individualized exercise plan and daily activity instruction to help decrease symptoms of SOB with activities of daily living.    Expected Outcomes  Short Term: Improve cardiorespiratory fitness to achieve a reduction of symptoms when performing ADLs;Long Term: Be able to perform more ADLs without symptoms or  delay the onset of symptoms    Heart Failure  Yes History of heart failure in the past    Intervention  Provide a combined exercise and nutrition program that is supplemented with education, support and counseling about heart failure. Directed toward relieving symptoms such as shortness of breath, decreased exercise tolerance, and extremity edema.    Expected Outcomes  Improve functional capacity of life;Short term: Attendance in program 2-3 days a week with increased exercise capacity. Reported lower sodium intake. Reported increased fruit and vegetable intake. Reports medication compliance.;Short term: Daily weights obtained and reported for increase. Utilizing diuretic protocols set by physician.;Long term: Adoption of self-care skills and reduction of barriers for early signs and symptoms recognition and intervention leading to self-care maintenance.    Lipids  Yes    Intervention  Provide education and support for participant on nutrition & aerobic/resistive exercise along with prescribed medications to achieve LDL '70mg'$ , HDL >'40mg'$ .    Expected Outcomes  Short Term: Participant states understanding of desired  cholesterol values and is compliant with medications prescribed. Participant is following exercise prescription and nutrition guidelines.;Long Term: Cholesterol controlled with medications as prescribed, with individualized exercise RX and with personalized nutrition plan. Value goals: LDL < '70mg'$ , HDL > 40 mg.       Core Components/Risk Factors/Patient Goals Review:    Core Components/Risk Factors/Patient Goals at Discharge (Final Review):    ITP Comments: ITP Comments    Row Name 05/12/18 1346 05/12/18 1403         ITP Comments  Graylee is concerned about her oxgyen level since she is short of breath still walking at times. We will have her on a cont. pulse ox even on Room Air. Virginia noticed that her pulse ox briefly on her 6 minute walk was 89% on Room air.  Margarethe has had 10/10  severe neck pain before her Cat Scan of her neck.  Amariah reports that she has chronic neck pain of 3/10 all the time. She has tried massage and other things but still  has the pain. Raima doesn't want to take stron pain medicines. Afsa recently had skin grafts done since she fell asleep with a heating pad. Aggie also had a cancer place taken of her left leg by her MD. It didn't hurt her to walk for her 6 minute walk in the hallway.   Med Review completed. Initial ITP created. Diagnosis can be found in [facility] 5/23         Comments: Initial ITP

## 2018-05-12 NOTE — Progress Notes (Signed)
Daily Session Note  Patient Details  Name: Stacie Mcdonald MRN: [id number] Date of Birth: 03/30/1946 Referring Provider:     Cardiac Rehab from 05/12/2018 in [facility]  Referring Provider  [place]      Encounter Date: 05/12/2018  Check In: Session Check In - 05/12/18 1403      Check-In   Location  [facility]    Staff Present  Meredith Craven, RN BSN;Amanda Sommer, BA, ACSM CEP, Exercise Physiologist    Supervising physician immediately available to respond to emergencies  See telemetry face sheet for immediately available ER MD    Medication changes reported      No    Fall or balance concerns reported     No    Tobacco Cessation  No Change    Warm-up and Cool-down  Performed as group-led instruction    Resistance Training Performed  Yes    VAD Patient?  No    PAD/SET Patient?  No      Pain Assessment   Currently in Pain?  No/denies        Exercise Prescription Changes - 05/12/18 1300      Response to Exercise   Blood Pressure (Admit)  142/70    Blood Pressure (Exercise)  152/70    Blood Pressure (Exit)  150/72    Heart Rate (Admit)  86 bpm    Heart Rate (Exercise)  110 bpm    Heart Rate (Exit)  113 bpm    Oxygen Saturation (Admit)  93 %    Oxygen Saturation (Exercise)  92 %    Oxygen Saturation (Exit)  93 %    Rating of Perceived Exertion (Exercise)  16       Social History   Tobacco Use  Smoking Status Never Smoker  Smokeless Tobacco Never Used    Goals Met:  Proper associated with RPD/PD & O2 Sat Exercise tolerated well Personal goals reviewed No report of cardiac concerns or symptoms Strength training completed today  Goals Unmet:  Not Applicable  Comments: Med Review completed    Dr. Mark Miller is Medical Director for [facility] and [facility]. 

## 2018-05-24 ENCOUNTER — Encounter: Payer: Medicare Other | Attending: Internal Medicine

## 2018-05-24 DIAGNOSIS — Z48812 Encounter for surgical aftercare following surgery on the circulatory system: Secondary | ICD-10-CM | POA: Diagnosis not present

## 2018-05-24 DIAGNOSIS — Z955 Presence of coronary angioplasty implant and graft: Secondary | ICD-10-CM | POA: Diagnosis not present

## 2018-05-24 NOTE — Progress Notes (Signed)
Daily Session Note  Patient Details  Name: Stacie Mcdonald MRN: [id number] Date of Birth: [birth year] Referring Provider:     Cardiac Rehab from 05/12/2018 in [facility]  Referring Provider  [facility]      Encounter Date: 05/24/2018  Check In: Session Check In - 05/24/18 0806      Check-In   Location  [facility]    Staff Present  Heath Lark, RN, BSN, CCRP;Mandi Ballard, BS, PEC;Nada Maclachlan, BA, ACSM CEP, Exercise Physiologist;Joseph Flavia Shipper    Supervising physician immediately available to respond to emergencies  See telemetry face sheet for immediately available ER MD    Medication changes reported      No    Fall or balance concerns reported     No    Warm-up and Cool-down  Performed on first and last piece of equipment    Resistance Training Performed  Yes    VAD Patient?  No    PAD/SET Patient?  No      Pain Assessment   Currently in Pain?  No/denies    Multiple Pain Sites  No          Social History   Tobacco Use  Smoking Status Never Smoker  Smokeless Tobacco Never Used    Goals Met:  Independence with exercise equipment Exercise tolerated well No report of cardiac concerns or symptoms Strength training completed today  Goals Unmet:  Not Applicable  Comments: First full day of exercise!  Patient was oriented to gym and equipment including functions, settings, policies, and procedures.  Patient's individual exercise prescription and treatment plan were reviewed.  All starting workloads were established based on the results of the 6 minute walk test done at initial orientation visit.  The plan for exercise progression was also introduced and progression will be customized based on patient's performance and goals.    Dr. Emily Filbert is Medical Director for [facility] and [facility].

## 2018-05-25 DIAGNOSIS — M4722 Other spondylosis with radiculopathy, cervical region: Secondary | ICD-10-CM | POA: Diagnosis not present

## 2018-05-25 DIAGNOSIS — M542 Cervicalgia: Secondary | ICD-10-CM | POA: Diagnosis not present

## 2018-05-25 DIAGNOSIS — M5412 Radiculopathy, cervical region: Secondary | ICD-10-CM | POA: Diagnosis not present

## 2018-05-25 DIAGNOSIS — I10 Essential (primary) hypertension: Secondary | ICD-10-CM | POA: Diagnosis not present

## 2018-05-25 DIAGNOSIS — Z6830 Body mass index (BMI) 30.0-30.9, adult: Secondary | ICD-10-CM | POA: Diagnosis not present

## 2018-05-25 DIAGNOSIS — M5030 Other cervical disc degeneration, unspecified cervical region: Secondary | ICD-10-CM | POA: Diagnosis not present

## 2018-05-25 DIAGNOSIS — M5020 Other cervical disc displacement, unspecified cervical region: Secondary | ICD-10-CM | POA: Diagnosis not present

## 2018-05-26 DIAGNOSIS — Z955 Presence of coronary angioplasty implant and graft: Secondary | ICD-10-CM

## 2018-05-26 DIAGNOSIS — Z48812 Encounter for surgical aftercare following surgery on the circulatory system: Secondary | ICD-10-CM | POA: Diagnosis not present

## 2018-05-26 NOTE — Progress Notes (Signed)
Daily Session Note  Patient Details  Name: Stacie Mcdonald MRN: [id number] Date of Birth: 24-Jul-1946 Referring Provider:     Cardiac Rehab from 05/12/2018 in [facility]  Referring Provider  [facility]      Encounter Date: 05/26/2018  Check In: Session Check In - 05/26/18 0934      Check-In   Location  [facility]    Staff Present  Constance Goltz, RN BSN;Susanne Bice, RN, BSN, Lance Sell, BA, ACSM CEP, Exercise Physiologist    Supervising physician immediately available to respond to emergencies  See telemetry face sheet for immediately available ER MD    Medication changes reported      No    Fall or balance concerns reported     No    Warm-up and Cool-down  Performed on first and last piece of equipment    Resistance Training Performed  Yes    VAD Patient?  No    PAD/SET Patient?  No      Pain Assessment   Currently in Pain?  No/denies    Multiple Pain Sites  No          Social History   Tobacco Use  Smoking Status Never Smoker  Smokeless Tobacco Never Used    Goals Met:  Independence with exercise equipment Exercise tolerated well No report of cardiac concerns or symptoms Strength training completed today  Goals Unmet:  Not Applicable  Comments: Pt able to follow exercise prescription today without complaint.  Will continue to monitor for progression.    Dr. Emily Filbert is Medical Director for [facility] and [facility].

## 2018-05-31 DIAGNOSIS — Z48812 Encounter for surgical aftercare following surgery on the circulatory system: Secondary | ICD-10-CM | POA: Diagnosis not present

## 2018-05-31 DIAGNOSIS — Z955 Presence of coronary angioplasty implant and graft: Secondary | ICD-10-CM | POA: Diagnosis not present

## 2018-05-31 NOTE — Progress Notes (Signed)
Daily Session Note  Patient Details  Name: Stacie Mcdonald MRN: [id number] Date of Birth: 1946/02/13 Referring Provider:     Cardiac Rehab from 05/12/2018 in [facility] Card[facility] and [facility] Rehab  Referring Provider  [facility]      Encounter Date: 05/31/2018  Check In: Session Check In - 05/31/18 0849      Check-In   Location  [facility]    Staff Present  Heath Lark, RN, BSN, CCRP;Jessica Guyton, MA, RCEP, CCRP, Exercise Physiologist;Mandi Ballard, BS, PEC;Nada Maclachlan, BA, ACSM CEP, Exercise Physiologist    Supervising physician immediately available to respond to emergencies  See telemetry face sheet for immediately available ER MD    Medication changes reported      No    Fall or balance concerns reported     No    Warm-up and Cool-down  Performed on first and last piece of equipment    Resistance Training Performed  Yes    VAD Patient?  No    PAD/SET Patient?  No      Pain Assessment   Currently in Pain?  No/denies    Multiple Pain Sites  No          Social History   Tobacco Use  Smoking Status Never Smoker  Smokeless Tobacco Never Used    Goals Met:  Independence with exercise equipment Exercise tolerated well No report of cardiac concerns or symptoms Strength training completed today  Goals Unmet:  Not Applicable  Comments: Pt able to follow exercise prescription today without complaint.  Will continue to monitor for progression.    Dr. Emily Filbert is Medical Director for [facility] and [facility].

## 2018-06-01 ENCOUNTER — Encounter: Payer: Self-pay | Admitting: *Deleted

## 2018-06-01 DIAGNOSIS — Z955 Presence of coronary angioplasty implant and graft: Secondary | ICD-10-CM

## 2018-06-01 NOTE — Progress Notes (Signed)
Cardiac Individual Treatment Plan  Patient Details  Name: Aishia T Martinique MRN: [id number] Date of Birth: 10-17-1946 Referring Provider:     Cardiac Rehab from 05/12/2018 in [facility] Cardiac and Pulmonary Rehab  Referring Provider  [facility]      Initial Encounter Date:    Cardiac Rehab from 05/12/2018 in [facility] Cardiac and Pulmonary Rehab  Date  05/12/18      Visit Diagnosis: Status post coronary artery stent placement  Patient's Home Medications on Admission:  Current Outpatient Medications:  .  aspirin 81 MG tablet, Take 81 mg by mouth daily. , Disp: , Rfl:  .  clopidogrel (PLAVIX) 75 MG tablet, TAKE ONE TABLET BY MOUTH DAILY, Disp: 90 tablet, Rfl: 1 .  digoxin (LANOXIN) 0.125 MG tablet, Take 0.125 mg by mouth daily., Disp: , Rfl:  .  furosemide (LASIX) 40 MG tablet, Take 40 mg by mouth daily., Disp: , Rfl:  .  GLUCOSAMINE-CHONDROITIN PO, Take 2 tablets by mouth daily., Disp: , Rfl:  .  isosorbide mononitrate (IMDUR) 60 MG 24 hr tablet, Take 60 mg by mouth daily., Disp: , Rfl:  .  Lancets (FREESTYLE) lancets, uSE TO CHECK SUGARS ONCE DAILY e11.9, Disp: 100 each, Rfl: 3 .  loratadine (CLARITIN) 10 MG tablet, Take 10 mg by mouth daily as needed for allergies., Disp: , Rfl:  .  losartan (COZAAR) 100 MG tablet, TAKE 1 TABLET (100 MG TOTAL) BY MOUTH DAILY., Disp: 90 tablet, Rfl: 1 .  metoprolol (TOPROL-XL) 200 MG 24 hr tablet, TAKE 1 TABLET BY MOUTH DAILY., Disp: 90 tablet, Rfl: 1 .  niacin 500 MG tablet, Take 1 tablet (500 mg total) by mouth daily., Disp: 90 tablet, Rfl: 1 .  omeprazole (PRILOSEC) 20 MG capsule, Take 1 capsule (20 mg total) by mouth daily. (Patient taking differently: Take 20 mg by mouth daily as needed (acid reflux). ), Disp: 90 capsule, Rfl: 1 .  ONE TOUCH ULTRA TEST test strip, CHECK DAILY, Disp: 100 each, Rfl: 2 .  potassium chloride SA (K-DUR,KLOR-CON) 20 MEQ tablet, Take 1 tablet (20 mEq total) by mouth daily., Disp: 30 tablet, Rfl: 3  Past Medical History: Past  Medical History:  Diagnosis Date  . Allergy   . Arthritis   . Asthma   . Breast Cancer 1991   R mastectomy. XRT , chemo  . Breast cancer ([place]) 1991   RT MASTECTOMY  . CAD (coronary artery disease) 2008   50% LAD occlusion 2009,  stent in mid LAD 2008 for 95%   . Chicken pox   . Colon polyps April 2009   by Colonoscopy, Gustavo Lah  . GERD (gastroesophageal reflux disease)   . Heart disease   . Hyperlipidemia   . Hypertension   . 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 Rheumatic fever   . Urinary tract infection     Tobacco Use: Social History   Tobacco Use  Smoking Status Never Smoker  Smokeless Tobacco Never Used    Labs: Recent Review Flowsheet Data    Labs for ITP Cardiac and Pulmonary Rehab Latest Ref Rng & Units 02/15/2015 06/15/2016 12/15/2016 06/08/2017 01/20/2018   Cholestrol 0 - 200 mg/dL 198 178 191 193 -   LDLCALC 0 - 99 mg/dL 125(H) 116(H) 117(H) 124(H) -   LDLDIRECT mg/dL - 135.0 133.0 142.0 123.0   HDL >39.00 mg/dL 39.60 36.40(L) 35.60(L) 36.40(L) -   Trlycerides 0.0 - 149.0 mg/dL 168.0(H) 125.0 192.0(H) 163.0(H) -   Hemoglobin A1c 4.6 -  6.5 % 6.7(H) 6.5 6.5 6.3 6.5       Exercise Target Goals:    Exercise Program Goal: Individual exercise prescription set using results from initial 6 min walk test and THRR while considering  patient's activity barriers and safety.   Exercise Prescription Goal: Initial exercise prescription builds to 30-45 minutes a day of aerobic activity, 2-3 days per week.  Home exercise guidelines will be given to patient during program as part of exercise prescription that the participant will acknowledge.  Activity Barriers & Risk Stratification: Activity Barriers & Cardiac Risk Stratification - 05/12/18 1351      Activity Barriers & Cardiac Risk Stratification   Activity Barriers  Arthritis;Shortness of Breath    Cardiac Risk Stratification  High       6 Minute Walk: 6 Minute Walk    Row Name 05/12/18  1343         6 Minute Walk   Distance  1214 feet     Walk Time  6 minutes     # of Rest Breaks  0     MPH  2.3     METS  2.6     RPE  16     Perceived Dyspnea   3     VO2 Peak  9.1     Symptoms  No     Resting HR  95 bpm     Resting BP  142/70     Resting Oxygen Saturation   93 %     Exercise Oxygen Saturation  during 6 min walk  91 %     Max Ex. HR  113 bpm     Max Ex. BP  152/72     2 Minute Post BP  150/72        Oxygen Initial Assessment: Oxygen Initial Assessment - 05/12/18 1350      Home Oxygen   Sleep Oxygen Prescription  None    Home Exercise Oxygen Prescription  None    Home at Rest Exercise Oxygen Prescription  None      Initial 6 min Walk   Oxygen Used  None      Program Oxygen Prescription   Program Oxygen Prescription  None       Oxygen Re-Evaluation:   Oxygen Discharge (Final Oxygen Re-Evaluation):   Initial Exercise Prescription: Initial Exercise Prescription - 05/12/18 1300      Date of Initial Exercise RX and Referring Provider   Date  05/12/18    Referring Provider  [facility]      Treadmill   MPH  2    Grade  0    Minutes  15    METs  2.5      NuStep   Level  2    SPM  80    Minutes  15    METs  2.6      REL-XR   Level  2    Speed  50    Minutes  15    METs  2.5      Prescription Details   Frequency (times per week)  2    Duration  Progress to 30 minutes of continuous aerobic without signs/symptoms of physical distress      Intensity   THRR 40-80% of Max Heartrate  115-136    Ratings of Perceived Exertion  11-13    Perceived Dyspnea  0-4      Resistance Training   Training Prescription  Yes    Weight  2 lb    Reps  10-15       Perform Capillary Blood Glucose checks as needed.  Exercise Prescription Changes: Exercise Prescription Changes    Row Name 05/12/18 1300 05/24/18 1300           Response to Exercise   Blood Pressure (Admit)  142/70  116/68      Blood Pressure (Exercise)  152/70  140/72       Blood Pressure (Exit)  150/72  148/62      Heart Rate (Admit)  86 bpm  92 bpm      Heart Rate (Exercise)  110 bpm  98 bpm      Heart Rate (Exit)  113 bpm  79 bpm      Oxygen Saturation (Admit)  93 %  -      Oxygen Saturation (Exercise)  92 %  -      Oxygen Saturation (Exit)  93 %  -      Rating of Perceived Exertion (Exercise)  16  13      Symptoms  -  none      Comments  -  first day of exercise      Duration  -  Continue with 30 min of aerobic exercise without signs/symptoms of physical distress.      Intensity  -  THRR unchanged        Progression   Progression  -  Continue to progress workloads to maintain intensity without signs/symptoms of physical distress.      Average METs  -  2.75        Resistance Training   Training Prescription  -  Yes      Weight  -  3 lb      Reps  -  10-15        Interval Training   Interval Training  -  No        Treadmill   MPH  -  2      Grade  -  0      Minutes  -  15      METs  -  2.5        REL-XR   Level  -  2      Speed  -  50      Minutes  -  15      METs  -  3         Exercise Comments: Exercise Comments    Row Name 05/24/18 0809           Exercise Comments  First full day of exercise!  Patient was oriented to gym and equipment including functions, settings, policies, and procedures.  Patient's individual exercise prescription and treatment plan were reviewed.  All starting workloads were established based on the results of the 6 minute walk test done at initial orientation visit.  The plan for exercise progression was also introduced and progression will be customized based on patient's performance and goals.          Exercise Goals and Review: Exercise Goals    Row Name 05/12/18 1343             Exercise Goals   Increase Physical Activity  Yes       Intervention  Provide advice, education, support and counseling about physical activity/exercise needs.;Develop an individualized exercise prescription for aerobic  and resistive training based on initial evaluation findings, risk stratification, comorbidities and participant's personal  goals.       Expected Outcomes  Short Term: Attend rehab on a regular basis to increase amount of physical activity.;Long Term: Add in home exercise to make exercise part of routine and to increase amount of physical activity.;Long Term: Exercising regularly at least 3-5 days a week.       Increase Strength and Stamina  Yes       Intervention  Provide advice, education, support and counseling about physical activity/exercise needs.;Develop an individualized exercise prescription for aerobic and resistive training based on initial evaluation findings, risk stratification, comorbidities and participant's personal goals.       Expected Outcomes  Short Term: Increase workloads from initial exercise prescription for resistance, speed, and METs.;Short Term: Perform resistance training exercises routinely during rehab and add in resistance training at home;Long Term: Improve cardiorespiratory fitness, muscular endurance and strength as measured by increased METs and functional capacity (6MWT)       Able to understand and use rate of perceived exertion (RPE) scale  Yes       Intervention  Provide education and explanation on how to use RPE scale       Expected Outcomes  Short Term: Able to use RPE daily in rehab to express subjective intensity level;Long Term:  Able to use RPE to guide intensity level when exercising independently       Able to understand and use Dyspnea scale  Yes       Intervention  Provide education and explanation on how to use Dyspnea scale       Expected Outcomes  Short Term: Able to use Dyspnea scale daily in rehab to express subjective sense of shortness of breath during exertion;Long Term: Able to use Dyspnea scale to guide intensity level when exercising independently       Knowledge and understanding of Target Heart Rate Range (THRR)  Yes       Intervention   Provide education and explanation of THRR including how the numbers were predicted and where they are located for reference       Expected Outcomes  Short Term: Able to state/look up THRR;Short Term: Able to use daily as guideline for intensity in rehab;Long Term: Able to use THRR to govern intensity when exercising independently       Able to check pulse independently  Yes       Intervention  Provide education and demonstration on how to check pulse in carotid and radial arteries.;Review the importance of being able to check your own pulse for safety during independent exercise       Expected Outcomes  Short Term: Able to explain why pulse checking is important during independent exercise;Long Term: Able to check pulse independently and accurately       Understanding of Exercise Prescription  Yes       Intervention  Provide education, explanation, and written materials on patient's individual exercise prescription       Expected Outcomes  Short Term: Able to explain program exercise prescription;Long Term: Able to explain home exercise prescription to exercise independently          Exercise Goals Re-Evaluation : Exercise Goals Re-Evaluation    Row Name 05/24/18 0809             Exercise Goal Re-Evaluation   Exercise Goals Review  Increase Physical Activity;Increase Strength and Stamina;Able to understand and use rate of perceived exertion (RPE) scale;Knowledge and understanding of Target Heart Rate Range (THRR)       Comments  Reviewed RPE scale, THR and program prescription with pt today.  Pt voiced understanding and was given a copy of goals to take home.        Expected Outcomes  Reviewed RPE scale, THR and program prescription with pt today.  Pt voiced understanding and was given a copy of goals to take home.           Discharge Exercise Prescription (Final Exercise Prescription Changes): Exercise Prescription Changes - 05/24/18 1300      Response to Exercise   Blood Pressure  (Admit)  116/68    Blood Pressure (Exercise)  140/72    Blood Pressure (Exit)  148/62    Heart Rate (Admit)  92 bpm    Heart Rate (Exercise)  98 bpm    Heart Rate (Exit)  79 bpm    Rating of Perceived Exertion (Exercise)  13    Symptoms  none    Comments  first day of exercise    Duration  Continue with 30 min of aerobic exercise without signs/symptoms of physical distress.    Intensity  THRR unchanged      Progression   Progression  Continue to progress workloads to maintain intensity without signs/symptoms of physical distress.    Average METs  2.75      Resistance Training   Training Prescription  Yes    Weight  3 lb    Reps  10-15      Interval Training   Interval Training  No      Treadmill   MPH  2    Grade  0    Minutes  15    METs  2.5      REL-XR   Level  2    Speed  50    Minutes  15    METs  3       Nutrition:  Target Goals: Understanding of nutrition guidelines, daily intake of sodium '1500mg'$ , cholesterol '200mg'$ , calories 30% from fat and 7% or less from saturated fats, daily to have 5 or more servings of fruits and vegetables.  Biometrics: Pre Biometrics - 05/12/18 1342      Pre Biometrics   Height  '5\' 3"'$  (1.6 m)    Weight  178 lb 3.2 oz (80.8 kg)    Waist Circumference  37.5 inches    Hip Circumference  43.25 inches    Waist to Hip Ratio  0.87 %    BMI (Calculated)  31.57    Single Leg Stand  25 seconds        Nutrition Therapy Plan and Nutrition Goals: Nutrition Therapy & Goals - 05/12/18 1355      Nutrition Therapy   RD appointment deferred  Yes    Drug/Food Interactions  Statins/Certain Fruits      Personal Nutrition Goals   Nutrition Goal  To cont to eat heart healthy.      Intervention Plan   Intervention  Prescribe, educate and counsel regarding individualized specific dietary modifications aiming towards targeted core components such as weight, hypertension, lipid management, diabetes, heart failure and other  comorbidities.;Nutrition handout(s) given to patient.    Expected Outcomes  Short Term Goal: Understand basic principles of dietary content, such as calories, fat, sodium, cholesterol and nutrients.;Short Term Goal: A plan has been developed with personal nutrition goals set during dietitian appointment.;Long Term Goal: Adherence to prescribed nutrition plan.       Nutrition Assessments: Nutrition Assessments - 05/12/18 1406      MEDFICTS  Scores   Pre Score  -- Patient did not want to fill out form       Nutrition Goals Re-Evaluation:   Nutrition Goals Discharge (Final Nutrition Goals Re-Evaluation):   Psychosocial: Target Goals: Acknowledge presence or absence of significant depression and/or stress, maximize coping skills, provide positive support system. Participant is able to verbalize types and ability to use techniques and skills needed for reducing stress and depression.   Initial Review & Psychosocial Screening: Initial Psych Review & Screening - 05/12/18 1358      Initial Review   Current issues with  Current Sleep Concerns      Family Dynamics   Good Support System?  Yes      Barriers   Psychosocial barriers to participate in program  The patient should benefit from training in stress management and relaxation.      Screening Interventions   Interventions  Encouraged to exercise;Program counselor consult    Expected Outcomes  Long Term Goal: Stressors or current issues are controlled or eliminated.;Short Term goal: Utilizing psychosocial counselor, staff and physician to assist with identification of specific Stressors or current issues interfering with healing process. Setting desired goal for each stressor or current issue identified.       Quality of Life Scores:  Quality of Life - 05/12/18 1409      Quality of Life   Select  Quality of Life      Quality of Life Scores   Health/Function Pre  22.87 %    Socioeconomic Pre  24.43 %    Psych/Spiritual Pre   26.79 %    Family Pre  27.6 %    GLOBAL Pre  24.69 %      Scores of 19 and below usually indicate a poorer quality of life in these areas.  A difference of  2-3 points is a clinically meaningful difference.  A difference of 2-3 points in the total score of the Quality of Life Index has been associated with significant improvement in overall quality of life, self-image, physical symptoms, and general health in studies assessing change in quality of life.  PHQ-9: Recent Review Flowsheet Data    Depression screen [facility] 2/9 05/12/2018 01/06/2018 06/08/2017 06/08/2016 02/11/2015   Decreased Interest 0 0 0 0 0   Down, Depressed, Hopeless 0 0 0 0 0   PHQ - 2 Score 0 0 0 0 0   Altered sleeping 1 - 0 - -   Tired, decreased energy 2 - 0 - -   Change in appetite 0 - 0 - -   Feeling bad or failure about yourself  0 - 0 - -   Trouble concentrating 0 - 0 - -   Moving slowly or fidgety/restless 0 - 0 - -   Suicidal thoughts 0 - 0 - -   PHQ-9 Score 3 - 0 - -   Difficult doing work/chores Somewhat difficult - - - -     Interpretation of Total Score  Total Score Depression Severity:  1-4 = Minimal depression, 5-9 = Mild depression, 10-14 = Moderate depression, 15-19 = Moderately severe depression, 20-27 = Severe depression   Psychosocial Evaluation and Intervention:   Psychosocial Re-Evaluation:   Psychosocial Discharge (Final Psychosocial Re-Evaluation):   Vocational Rehabilitation: Provide vocational rehab assistance to qualifying candidates.   Vocational Rehab Evaluation & Intervention: Vocational Rehab - 05/12/18 1337      Initial Vocational Rehab Evaluation & Intervention   Assessment shows need for Vocational Rehabilitation  No  Education: Education Goals: Education classes will be provided on a variety of topics geared toward better understanding of heart health and risk factor modification. Participant will state understanding/return demonstration of topics presented as noted by  education test scores.  Learning Barriers/Preferences: Learning Barriers/Preferences - 05/12/18 1337      Learning Barriers/Preferences   Learning Barriers  None    Learning Preferences  Written Material       Education Topics:  AED/CPR: - Group verbal and written instruction with the use of models to demonstrate the basic use of the AED with the basic ABC's of resuscitation.   General Nutrition Guidelines/Fats and Fiber: -Group instruction provided by verbal, written material, models and posters to present the general guidelines for heart healthy nutrition. Gives an explanation and review of dietary fats and fiber.   Cardiac Rehab from 05/31/2018 in [facility] Cardiac and Pulmonary Rehab  Date  05/31/18  Educator  CR  Instruction Review Code  1- Verbalizes Understanding      Controlling Sodium/Reading Food Labels: -Group verbal and written material supporting the discussion of sodium use in heart healthy nutrition. Review and explanation with models, verbal and written materials for utilization of the food label.   Exercise Physiology & General Exercise Guidelines: - Group verbal and written instruction with models to review the exercise physiology of the cardiovascular system and associated critical values. Provides general exercise guidelines with specific guidelines to those with heart or lung disease.    Aerobic Exercise & Resistance Training: - Gives group verbal and written instruction on the various components of exercise. Focuses on aerobic and resistive training programs and the benefits of this training and how to safely progress through these programs..   Flexibility, Balance, Mind/Body Relaxation: Provides group verbal/written instruction on the benefits of flexibility and balance training, including mind/body exercise modes such as yoga, pilates and tai chi.  Demonstration and skill practice provided.   Stress and Anxiety: - Provides group verbal and written  instruction about the health risks of elevated stress and causes of high stress.  Discuss the correlation between heart/lung disease and anxiety and treatment options. Review healthy ways to manage with stress and anxiety.   Cardiac Rehab from 05/24/2018 in [facility] Cardiac and Pulmonary Rehab  Date  05/24/18  Educator  SB  Instruction Review Code  1- Verbalizes Understanding      Depression: - Provides group verbal and written instruction on the correlation between heart/lung disease and depressed mood, treatment options, and the stigmas associated with seeking treatment.   Anatomy & Physiology of the Heart: - Group verbal and written instruction and models provide basic cardiac anatomy and physiology, with the coronary electrical and arterial systems. Review of Valvular disease and Heart Failure   Cardiac Rehab from 05/31/2018 in [facility] Cardiac and Pulmonary Rehab  Date  05/26/18  Educator  SB  Instruction Review Code  1- Verbalizes Understanding      Cardiac Procedures: - Group verbal and written instruction to review commonly prescribed medications for heart disease. Reviews the medication, class of the drug, and side effects. Includes the steps to properly store meds and maintain the prescription regimen. (beta blockers and nitrates)   Cardiac Medications I: - Group verbal and written instruction to review commonly prescribed medications for heart disease. Reviews the medication, class of the drug, and side effects. Includes the steps to properly store meds and maintain the prescription regimen.   Cardiac Medications II: -Group verbal and written instruction to review commonly prescribed medications for heart  disease. Reviews the medication, class of the drug, and side effects. (all other drug classes)    Go Sex-Intimacy & Heart Disease, Get SMART - Goal Setting: - Group verbal and written instruction through game format to discuss heart disease and the return to sexual intimacy.  Provides group verbal and written material to discuss and apply goal setting through the application of the S.M.A.R.T. Method.   Other Matters of the Heart: - Provides group verbal, written materials and models to describe Stable Angina and Peripheral Artery. Includes description of the disease process and treatment options available to the cardiac patient.   Cardiac Rehab from 05/31/2018 in [facility] Cardiac and Pulmonary Rehab  Date  05/26/18  Educator  SB  Instruction Review Code  1- Verbalizes Understanding      Exercise & Equipment Safety: - Individual verbal instruction and demonstration of equipment use and safety with use of the equipment.   Cardiac Rehab from 05/24/2018 in [facility] Cardiac and Pulmonary Rehab  Date  05/12/18  Educator  C. Enterkin,RN  Instruction Review Code  1- Verbalizes Understanding      Infection Prevention: - Provides verbal and written material to individual with discussion of infection control including proper hand washing and proper equipment cleaning during exercise session.   Cardiac Rehab from 05/24/2018 in [facility] Cardiac and Pulmonary Rehab  Date  05/12/18  Educator  [facility]  Instruction Review Code  1- Verbalizes Understanding      Falls Prevention: - Provides verbal and written material to individual with discussion of falls prevention and safety.   Cardiac Rehab from 05/24/2018 in [facility] Cardiac and Pulmonary Rehab  Date  05/12/18  Educator  C. Enterkin  Instruction Review Code  1- Verbalizes Understanding      Diabetes: - Individual verbal and written instruction to review signs/symptoms of diabetes, desired ranges of glucose level fasting, after meals and with exercise. Acknowledge that pre and post exercise glucose checks will be done for 3 sessions at entry of program.   Know Your Numbers and Risk Factors: -Group verbal and written instruction about important numbers in your health.  Discussion of what are risk factors and how they play a role in the  disease process.  Review of Cholesterol, Blood Pressure, Diabetes, and BMI and the role they play in your overall health.   Sleep Hygiene: -Provides group verbal and written instruction about how sleep can affect your health.  Define sleep hygiene, discuss sleep cycles and impact of sleep habits. Review good sleep hygiene tips.    Other: -Provides group and verbal instruction on various topics (see comments)   Knowledge Questionnaire Score: Knowledge Questionnaire Score - 05/12/18 1409      Knowledge Questionnaire Score   Pre Score  26/26       Core Components/Risk Factors/Patient Goals at Admission: Personal Goals and Risk Factors at Admission - 05/12/18 1356      Core Components/Risk Factors/Patient Goals on Admission   Improve shortness of breath with ADL's  Yes    Intervention  Provide education, individualized exercise plan and daily activity instruction to help decrease symptoms of SOB with activities of daily living.    Expected Outcomes  Short Term: Improve cardiorespiratory fitness to achieve a reduction of symptoms when performing ADLs;Long Term: Be able to perform more ADLs without symptoms or delay the onset of symptoms    Heart Failure  Yes History of heart failure in the past    Intervention  Provide a combined exercise and nutrition program that is  supplemented with education, support and counseling about heart failure. Directed toward relieving symptoms such as shortness of breath, decreased exercise tolerance, and extremity edema.    Expected Outcomes  Improve functional capacity of life;Short term: Attendance in program 2-3 days a week with increased exercise capacity. Reported lower sodium intake. Reported increased fruit and vegetable intake. Reports medication compliance.;Short term: Daily weights obtained and reported for increase. Utilizing diuretic protocols set by physician.;Long term: Adoption of self-care skills and reduction of barriers for early signs and  symptoms recognition and intervention leading to self-care maintenance.    Lipids  Yes    Intervention  Provide education and support for participant on nutrition & aerobic/resistive exercise along with prescribed medications to achieve LDL '70mg'$ , HDL >'40mg'$ .    Expected Outcomes  Short Term: Participant states understanding of desired cholesterol values and is compliant with medications prescribed. Participant is following exercise prescription and nutrition guidelines.;Long Term: Cholesterol controlled with medications as prescribed, with individualized exercise RX and with personalized nutrition plan. Value goals: LDL < '70mg'$ , HDL > 40 mg.       Core Components/Risk Factors/Patient Goals Review:    Core Components/Risk Factors/Patient Goals at Discharge (Final Review):    ITP Comments: ITP Comments    Row Name 05/12/18 1346 05/12/18 1403 06/01/18 0626       ITP Comments  Nyasia is concerned about her oxgyen level since she is short of breath still walking at times. We will have her on a cont. pulse ox even on Room Air. Nikka noticed that her pulse ox briefly on her 6 minute walk was 89% on Room air.  Zina has had 10/10 severe neck pain before her Cat Scan of her neck.  Layza reports that she has chronic neck pain of 3/10 all the time. She has tried massage and other things but still  has the pain. Lai doesn't want to take stron pain medicines. Sharolyn recently had skin grafts done since she fell asleep with a heating pad. Antha also had a cancer place taken of her left leg by her MD. It didn't hurt her to walk for her 6 minute walk in the hallway.   Med Review completed. Initial ITP created. Diagnosis can be found in [facility] 5/23  30 day review. Continue with ITP unless directed changes per Medical Director review.  New to program        Comments: 30 day review. Continue with ITP unless directed changes per Medical Director review.

## 2018-06-02 ENCOUNTER — Encounter: Payer: Medicare Other | Admitting: *Deleted

## 2018-06-02 DIAGNOSIS — Z955 Presence of coronary angioplasty implant and graft: Secondary | ICD-10-CM

## 2018-06-02 DIAGNOSIS — Z48812 Encounter for surgical aftercare following surgery on the circulatory system: Secondary | ICD-10-CM | POA: Diagnosis not present

## 2018-06-02 NOTE — Progress Notes (Signed)
Daily Session Note  Patient Details  Name: Stacie Mcdonald MRN: [id number] Date of Birth: 04-Jul-1946 Referring Provider:     Cardiac Rehab from 05/12/2018 in [facility] Cardiac and Pulmonary Rehab  Referring Provider  [facility]      Encounter Date: 06/02/2018  Check In: Session Check In - 06/02/18 0830      Check-In   Location  [facility]-Cardiac & Pulmonary Rehab    Staff Present  Heath Lark, RN, BSN, CCRP;Jessica [place], MA, RCEP, CCRP, Exercise Physiologist;Mandi Zachery Conch, BS, [facility]    Supervising physician immediately available to respond to emergencies  See telemetry face sheet for immediately available ER MD    Medication changes reported      No    Fall or balance concerns reported     No    Warm-up and Cool-down  Performed on first and last piece of equipment    Resistance Training Performed  Yes    VAD Patient?  No    PAD/SET Patient?  No      Pain Assessment   Currently in Pain?  No/denies          Social History   Tobacco Use  Smoking Status Never Smoker  Smokeless Tobacco Never Used    Goals Met:  Independence with exercise equipment Exercise tolerated well Personal goals reviewed No report of cardiac concerns or symptoms Strength training completed today  Goals Unmet:  Not Applicable  Comments: Pt able to follow exercise prescription today without complaint.  Will continue to monitor for progression. Reviewed home exercise with pt today.  Pt plans to continue walking at home for exercise.  She also uses her Silver Sneakers to go to [employer] [place].  Reviewed THR, pulse, RPE, sign and symptoms, NTG use, and when to call 911 or MD.  Also discussed weather considerations and indoor options.  Pt voiced understanding.    Dr. Emily Filbert is Medical Director for [place] and [facility].

## 2018-06-07 DIAGNOSIS — Z48812 Encounter for surgical aftercare following surgery on the circulatory system: Secondary | ICD-10-CM | POA: Diagnosis not present

## 2018-06-07 DIAGNOSIS — Z955 Presence of coronary angioplasty implant and graft: Secondary | ICD-10-CM | POA: Diagnosis not present

## 2018-06-07 NOTE — Progress Notes (Signed)
Daily Session Note  Patient Details  Name: Stacie Mcdonald MRN: [id number] Date of Birth: [birth year] Referring Provider:     Cardiac Rehab from 05/12/2018 in [facility] Cardiac and Pulmonary Rehab  Referring Provider  [facility]      Encounter Date: 06/07/2018  Check In: Session Check In - 06/07/18 0840      Check-In   Location  [facility]-Cardiac & Pulmonary Rehab    Staff Present  Heath Lark, RN, BSN, CCRP;Mandi Mccabe Gloria, BS, PEC;Nada Maclachlan, BA, ACSM CEP, Exercise Physiologist    Supervising physician immediately available to respond to emergencies  See telemetry face sheet for immediately available ER MD    Medication changes reported      No    Fall or balance concerns reported     No    Tobacco Cessation  No Change    Warm-up and Cool-down  Performed on first and last piece of equipment    Resistance Training Performed  Yes    VAD Patient?  No    PAD/SET Patient?  No      Pain Assessment   Currently in Pain?  No/denies    Multiple Pain Sites  No          Social History   Tobacco Use  Smoking Status Never Smoker  Smokeless Tobacco Never Used    Goals Met:  Independence with exercise equipment Exercise tolerated well No report of cardiac concerns or symptoms Strength training completed today  Goals Unmet:  Not Applicable  Comments: Pt able to follow exercise prescription today without complaint.  Will continue to monitor for progression.    Dr. Emily Filbert is Medical Director for [facility] and [facility].

## 2018-06-09 ENCOUNTER — Ambulatory Visit (INDEPENDENT_AMBULATORY_CARE_PROVIDER_SITE_OTHER): Payer: Medicare Other

## 2018-06-09 VITALS — BP 132/64 | HR 79 | Temp 98.9°F | Resp 15 | Ht 63.0 in | Wt 177.1 lb

## 2018-06-09 DIAGNOSIS — Z955 Presence of coronary angioplasty implant and graft: Secondary | ICD-10-CM | POA: Diagnosis not present

## 2018-06-09 DIAGNOSIS — Z0000 Encounter for general adult medical examination without abnormal findings: Secondary | ICD-10-CM | POA: Diagnosis not present

## 2018-06-09 DIAGNOSIS — Z48812 Encounter for surgical aftercare following surgery on the circulatory system: Secondary | ICD-10-CM | POA: Diagnosis not present

## 2018-06-09 NOTE — Progress Notes (Signed)
Daily Session Note  Patient Details  Name: Stacie Mcdonald MRN: [id number] Date of Birth: [birth year] Referring Provider:     Cardiac Rehab from 05/12/2018 in [facility] Cardiac and Pulmonary Rehab  Referring Provider  [facility]      Encounter Date: 06/09/2018  Check In: Session Check In - 06/09/18 0844      Check-In   Location  [facility]-Cardiac & Pulmonary Rehab    Staff Present  Gerlene Burdock, RN, BSN;Jessica Hawkins, MA, RCEP, CCRP, Exercise Physiologist;Mandi Ballard, BS, PEC;Nada Maclachlan, BA, ACSM CEP, Exercise Physiologist    Supervising physician immediately available to respond to emergencies  See telemetry face sheet for immediately available ER MD    Medication changes reported      No    Fall or balance concerns reported     No    Warm-up and Cool-down  Performed on first and last piece of equipment    Resistance Training Performed  Yes    VAD Patient?  No    PAD/SET Patient?  No      Pain Assessment   Currently in Pain?  No/denies    Multiple Pain Sites  No        Exercise Prescription Changes - 06/08/18 0900      Response to Exercise   Blood Pressure (Admit)  132/68    Blood Pressure (Exercise)  150/68    Blood Pressure (Exit)  132/80    Heart Rate (Admit)  89 bpm    Heart Rate (Exercise)  123 bpm    Heart Rate (Exit)  81 bpm    Rating of Perceived Exertion (Exercise)  15    Symptoms  none    Duration  Continue with 30 min of aerobic exercise without signs/symptoms of physical distress.    Intensity  THRR unchanged      Progression   Progression  Continue to progress workloads to maintain intensity without signs/symptoms of physical distress.    Average METs  2.84      Resistance Training   Training Prescription  Yes    Weight  3 lbs    Reps  10-15      Interval Training   Interval Training  No      Treadmill   MPH  2    Minutes  15    METs  2.5      NuStep   Level  2    Minutes  15    METs  2.5      REL-XR   Level  2    Minutes  15    METs   3.5      Home Exercise Plan   Plans to continue exercise at  [employer] (comment) walking and [employer] [place]    Frequency  Add 3 additional days to program exercise sessions.    Initial Home Exercises Provided  06/02/18       Social History   Tobacco Use  Smoking Status Never Smoker  Smokeless Tobacco Never Used    Goals Met:  Independence with exercise equipment Exercise tolerated well No report of cardiac concerns or symptoms Strength training completed today  Goals Unmet:  Not Applicable  Comments: Pt able to follow exercise prescription today without complaint.  Will continue to monitor for progression.    Dr. Emily Filbert is Medical Director for [facility] and [facility].

## 2018-06-09 NOTE — Progress Notes (Addendum)
Subjective:   Stacie Mcdonald is a 72 y.o. female who presents for Medicare Annual (Subsequent) preventive examination.  Review of Systems:  No ROS.  Medicare Wellness Visit. Additional risk factors are reflected in the social history.  Cardiac Risk Factors include: advanced age (>41men, >43 women);diabetes mellitus;hypertension     Objective:     Vitals: BP 132/64 (BP Location: Left Arm, Patient Position: Sitting, Cuff Size: Normal)   Pulse 79   Temp 98.9 F (37.2 C) (Oral)   Resp 15   Ht 5\' 3"  (1.6 m)   Wt 177 lb 1.9 oz (80.3 kg)   SpO2 95%   BMI 31.38 kg/m   Body mass index is 31.38 kg/m.  Advanced Directives 06/09/2018 05/12/2018 04/07/2018 06/08/2017 06/08/2016  Does Patient Have a Medical Advance Directive? Yes Yes Yes Yes Yes  Type of Paramedic of [place];Living will - [place];Living will [place];Living will [place];Living will  Does patient want to make changes to medical advance directive? No - Patient declined No - Patient declined No - Patient declined No - Patient declined -  Copy of [place] in Chart? No - copy requested - No - copy requested No - copy requested -    Tobacco Social History   Tobacco Use  Smoking Status Never Smoker  Smokeless Tobacco Never Used     Counseling given: Not Answered   Clinical Intake:  Pre-visit preparation completed: Yes  Pain : 0-10 Pain Score: 4  Pain Type: Chronic pain Pain Location: Neck Pain Orientation: Left Pain Radiating Towards: Shoulder Pain Descriptors / Indicators: Discomfort, Grimacing Pain Onset: More than a month ago Pain Frequency: Intermittent Pain Relieving Factors: Recent surgery.  Cardiac rehab exercises.  Followed by Neurosurgeon.  Effect of Pain on Daily Activities: She paces herself.   Pain Relieving Factors: Recent surgery.  Cardiac rehab exercises.  Followed by Neurosurgeon.    Nutritional Status: BMI > 30  Obese Diabetes: Yes  How often do you need to have someone help you when you read instructions, pamphlets, or other written materials from your doctor or pharmacy?: 1 - Never  Interpreter Needed?: No     Past Medical History:  Diagnosis Date  . Allergy   . Arthritis   . Asthma   . Breast Cancer 1991   R mastectomy. XRT , chemo  . Breast cancer ([place]) 1991   RT MASTECTOMY  . CAD (coronary artery disease) 2008   50% LAD occlusion 2009,  stent in mid LAD 2008 for 95%   . Chicken pox   . Colon polyps April 2009   by Colonoscopy, Gustavo Lah  . GERD (gastroesophageal reflux disease)   . Heart disease   . Hyperlipidemia   . Hypertension   . 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 Rheumatic fever   . Urinary tract infection    Past Surgical History:  Procedure Laterality Date  . BREAST BIOPSY     right breast reconstruction S/P mastectomy  . BREAST SURGERY     right mastectomy  . CORONARY ANGIOPLASTY WITH STENT PLACEMENT  Jan 2013   mid RCA 75% occlusion resolved to 0%, [place]  . CORONARY STENT INTERVENTION N/A 04/07/2018   Procedure: CORONARY STENT INTERVENTION;  Surgeon: Yolonda Kida, MD;  Location: [place] [facility];  Service: Cardiovascular;  Laterality: N/A;  . MASTECTOMY Right 1991   BREAST CA  . RIGHT/LEFT HEART  CATH AND CORONARY ANGIOGRAPHY N/A 04/07/2018   Procedure: RIGHT/LEFT HEART CATH AND CORONARY ANGIOGRAPHY;  Surgeon: Yolonda Kida, MD;  Location: [place] [facility];  Service: Cardiovascular;  Laterality: N/A;  . TONSILLECTOMY     Family History  Problem Relation Age of Onset  . Heart disease Mother 63       massive MI  . Heart disease Father 38       AMI  . Cancer Father        metastatic lung CA  . Diabetes Sister   . Cancer Maternal Aunt   . Breast cancer Maternal Aunt 4  . Breast cancer Cousin        8s  . Breast cancer Cousin        79s   Social History   Socioeconomic  History  . Marital status: Married    Spouse name: Blondell Reveal   . Number of children: 4  . Years of education: bachelors  . Highest education level: Not on file  Occupational History  . Occupation: RETIRED  Social Needs  . Financial resource strain: Not hard at all  . Food insecurity:    Worry: Never true    Inability: Never true  . Transportation needs:    Medical: No    Non-medical: No  Tobacco Use  . Smoking status: Never Smoker  . Smokeless tobacco: Never Used  Substance and Sexual Activity  . Alcohol use: Yes    Comment: occasional wine  . Drug use: No  . Sexual activity: Yes  Lifestyle  . Physical activity:    Days per week: 3 days    Minutes per session: 30 min  . Stress: Not at all  Relationships  . Social connections:    Talks on phone: More than three times a week    Gets together: More than three times a week    Attends religious service: More than 4 times per year    Active member of club or organization: Yes    Attends meetings of clubs or organizations: More than 4 times per year    Relationship status: Married  Other Topics Concern  . Not on file  Social History Narrative  . Not on file    Outpatient Encounter Medications as of 06/09/2018  Medication Sig  . aspirin 81 MG tablet Take 81 mg by mouth daily.   . clopidogrel (PLAVIX) 75 MG tablet TAKE ONE TABLET BY MOUTH DAILY  . digoxin (LANOXIN) 0.125 MG tablet Take 0.125 mg by mouth daily.  . furosemide (LASIX) 40 MG tablet Take 40 mg by mouth daily.  Marland Kitchen GLUCOSAMINE-CHONDROITIN PO Take 2 tablets by mouth daily.  . isosorbide mononitrate (IMDUR) 60 MG 24 hr tablet Take 60 mg by mouth daily.  . Lancets (FREESTYLE) lancets uSE TO CHECK SUGARS ONCE DAILY e11.9  . loratadine (CLARITIN) 10 MG tablet Take 10 mg by mouth daily as needed for allergies.  Marland Kitchen losartan (COZAAR) 100 MG tablet TAKE 1 TABLET (100 MG TOTAL) BY MOUTH DAILY.  . metoprolol (TOPROL-XL) 200 MG 24 hr tablet TAKE 1 TABLET BY MOUTH DAILY.  .  niacin 500 MG tablet Take 1 tablet (500 mg total) by mouth daily.  Marland Kitchen omeprazole (PRILOSEC) 20 MG capsule Take 1 capsule (20 mg total) by mouth daily. (Patient taking differently: Take 20 mg by mouth daily as needed (acid reflux). )  . ONE TOUCH ULTRA TEST test strip CHECK DAILY  . potassium chloride SA (K-DUR,KLOR-CON) 20 MEQ tablet Take 1  tablet (20 mEq total) by mouth daily.   No facility-administered encounter medications on file as of 06/09/2018.     Activities of Daily Living In your present state of health, do you have any difficulty performing the following activities: 06/09/2018 04/07/2018  Hearing? N -  Vision? N -  Difficulty concentrating or making decisions? N -  Walking or climbing stairs? N -  Dressing or bathing? N -  Doing errands, shopping? N N  Comment Drives minimal amount of time due to chronic shoulder and neck pain.  Followed by Neurosurgeon.  -  Preparing Food and eating ? N -  Using the Toilet? N -  In the past six months, have you accidently leaked urine? N -  Do you have problems with loss of bowel control? N -  Managing your Medications? N -  Managing your Finances? N -  Housekeeping or managing your Housekeeping? N -  Some recent data might be hidden    Patient Care Team: Crecencio Mc, MD as PCP - General (Internal Medicine)    Assessment:   This is a routine wellness examination for Stacie Mcdonald. The goal of the wellness visit is to assist the patient how to close the gaps in care and create a preventative care plan for the patient.   The roster of all physicians providing medical care to patient is listed in the Snapshot section of the chart.  Osteoporosis risk reviewed.    Safety issues reviewed; Smoke and carbon monoxide detectors in the home. No firearms in the home. Wears seatbelts when driving or riding with others. No violence in the home.  They do not have excessive sun exposure.  Discussed the need for sun protection: hats, long sleeves and  the use of sunscreen if there is significant sun exposure.  Patient is alert, normal appearance, oriented to person/place/and time. Correctly identified the president of the [place] and recalls of 3/3 words.Performs simple calculations and can read correct time from watch face. Displays appropriate judgement.  No new identified risk were noted.  No failures at ADL's or IADL's.   BMI- discussed the importance of a healthy diet, water intake and the benefits of aerobic exercise. Educational material provided.   24 hour diet recall: Regular diet  Dental- every 6 months.  Sleep patterns- Sleep is broken through the night due to neck and shoulder pain.  Currently followed by Neurosurgeon.  States she is not taking any over the counter medications for pain and melatonin does not work for her.  Requests xanax to assist with sleeping.  Made aware she must discuss this option with pcp, appointment offered and declined. Emmi sleep tips sent via mychart.   Exercise Activities and Dietary recommendations Current Exercise Habits: Home exercise routine, Type of exercise: strength training/weights;stretching;calisthenics;treadmill(Cardiac rehab ), Time (Minutes): 60, Frequency (Times/Week): 5, Weekly Exercise (Minutes/Week): 300, Intensity: Mild  Goals    . Maintain Healthy Lifestyle       Fall Risk Fall Risk  06/09/2018 05/12/2018 01/06/2018 06/08/2017 06/08/2016  Falls in the past year? No No No No No    Depression Screen PHQ 2/9 Scores 06/09/2018 05/12/2018 01/06/2018 06/08/2017  PHQ - 2 Score 0 0 0 0  PHQ- 9 Score - 3 - 0     Cognitive Function MMSE - Mini Mental State Exam 06/09/2018 06/08/2017  Orientation to time 5 5  Orientation to Place 5 5  Registration 3 3  Attention/ Calculation 5 5  Recall 3 3  Language- name 2 objects 2  2  Language- repeat 1 1  Language- follow 3 step command 3 3  Language- read & follow direction 1 1  Write a sentence 1 1  Copy design 1 1  Total score 30 30          Immunization History  Administered Date(s) Administered  . Influenza Split 09/03/2013, 08/01/2014  . Influenza, High Dose Seasonal PF 09/03/2017  . Influenza-Unspecified 08/16/2012  . Pneumococcal Conjugate-13 02/13/2015  . Pneumococcal Polysaccharide-23 12/05/2009, 06/08/2017  . Td 03/13/2010  . Tdap 12/06/2003    Screening Tests Health Maintenance  Topic Date Due  . FOOT EXAM  09/10/2017  . INFLUENZA VACCINE  06/16/2018  . OPHTHALMOLOGY EXAM  06/17/2018  . HEMOGLOBIN A1C  07/23/2018  . MAMMOGRAM  07/14/2019  . TETANUS/TDAP  03/13/2020  . COLONOSCOPY  04/12/2024  . DEXA SCAN  Completed  . Hepatitis C Screening  Completed  . PNA vac Low Risk Adult  Completed      Plan:    End of life planning; Advance aging; Advanced directives discussed. Copy of current HCPOA/Living Will requested.    I have personally reviewed and noted the following in the patient's chart:   . Medical and social history . Use of alcohol, tobacco or illicit drugs  . Current medications and supplements . Functional ability and status . Nutritional status . Physical activity . Advanced directives . List of other physicians . Hospitalizations, surgeries, and ER visits in previous 12 months . Vitals . Screenings to include cognitive, depression, and falls . Referrals and appointments  In addition, I have reviewed and discussed with patient certain preventive protocols, quality metrics, and best practice recommendations. A written personalized care plan for preventive services as well as general preventive health recommendations were provided to patient.     OBrien-Blaney, Shivaay Stormont L, LPN  3/64/6803    I have reviewed the above information and agree with above.   Deborra Medina, MD

## 2018-06-09 NOTE — Patient Instructions (Addendum)
  Ms. Stacie Mcdonald , Thank you for taking time to come for your Medicare Wellness Visit. I appreciate your ongoing commitment to your health goals. Please review the following plan we discussed and let me know if I can assist you in the future.   Emmi sleep tips sent via mychart.  These are the goals we discussed: Goals    . Maintain Healthy Lifestyle       This is a list of the screening recommended for you and due dates:  Health Maintenance  Topic Date Due  . Complete foot exam   09/10/2017  . Flu Shot  06/16/2018  . Eye exam for diabetics  06/17/2018  . Hemoglobin A1C  07/23/2018  . Mammogram  07/14/2019  . Tetanus Vaccine  03/13/2020  . Colon Cancer Screening  04/12/2024  . DEXA scan (bone density measurement)  Completed  .  Hepatitis C: One time screening is recommended by Center for Disease Control  (CDC) for  adults born from [birth year] through 1966.   Completed  . Pneumonia vaccines  Completed

## 2018-06-14 DIAGNOSIS — Z955 Presence of coronary angioplasty implant and graft: Secondary | ICD-10-CM | POA: Diagnosis not present

## 2018-06-14 DIAGNOSIS — Z48812 Encounter for surgical aftercare following surgery on the circulatory system: Secondary | ICD-10-CM | POA: Diagnosis not present

## 2018-06-14 NOTE — Progress Notes (Signed)
Daily Session Note  Patient Details  Name: Stacie Mcdonald MRN: [id number] Date of Birth: 05/11/1946 Referring Provider:     Cardiac Re[facility]/12/2018 in [facility] Cardiac [facility] Pul[facility] Rehab  Referring Provider  [facility]      Encounter Date: 06/14/2018  Check In: Session Check In - 06/14/18 0929      Check-In   Supervising physician immediately available to respond to emergencies  See telemetry face sheet for immediately available ER MD    Location  [facility]-Cardiac & Pulmonary Rehab    Staff Present  Heath Lark, RN, BSN, CCRP;Jessica [place], MA, RCEP, CCRP, Exercise Physiologist;Leeroy Lovings Oletta Darter, [place], ACSM CEP, Exercise Physiologist    Medication changes reported      No    Fall or balance concerns reported     No    Warm-up and Cool-down  Performed on first and last piece of equipment    Resistance Training Performed  Yes    VAD Patient?  No    PAD/SET Patient?  No      Pain Assessment   Currently in Pain?  No/denies    Multiple Pain Sites  No          Social History   Tobacco Use  Smoking Status Never Smoker  Smokeless Tobacco Never Used    Goals Met:  Independence with exercise equipment Exercise tolerated well No report of cardiac concerns or symptoms Strength training completed today  Goals Unmet:  Not Applicable  Comments: Pt able to follow exercise prescription today without complaint.  Will continue to monitor for progression.    Dr. Emily Filbert is Medical Director for [facility].

## 2018-06-16 ENCOUNTER — Encounter: Payer: Medicare Other | Attending: Internal Medicine

## 2018-06-16 DIAGNOSIS — Z955 Presence of coronary angioplasty implant and graft: Secondary | ICD-10-CM | POA: Insufficient documentation

## 2018-06-16 DIAGNOSIS — Z48812 Encounter for surgical aftercare following surgery on the circulatory system: Secondary | ICD-10-CM | POA: Insufficient documentation

## 2018-06-20 DIAGNOSIS — E113393 Type 2 diabetes mellitus with moderate nonproliferative diabetic retinopathy without macular edema, bilateral: Secondary | ICD-10-CM | POA: Diagnosis not present

## 2018-06-21 ENCOUNTER — Encounter: Payer: Medicare Other | Admitting: *Deleted

## 2018-06-21 DIAGNOSIS — Z955 Presence of coronary angioplasty implant and graft: Secondary | ICD-10-CM

## 2018-06-21 DIAGNOSIS — Z48812 Encounter for surgical aftercare following surgery on the circulatory system: Secondary | ICD-10-CM | POA: Diagnosis not present

## 2018-06-21 NOTE — Progress Notes (Signed)
Daily Session Note  Patient Details  Name: Stacie Mcdonald MRN: [id number] Date of Birth: 10/16/1946 Referring Provider:     Cardiac Rehab from 05/12/2018 in [facility] Cardiac and Pulmonary Rehab  Referring Provider  [facility]      Encounter Date: 06/21/2018  Check In: Session Check In - 06/21/18 0837      Check-In   Supervising physician immediately available to respond to emergencies  See telemetry face sheet for immediately available ER MD    Location  [facility]-Cardiac & Pulmonary Rehab    Staff Present  Heath Lark, RN, BSN, CCRP;Brandan Glauber [place], MA, RCEP, CCRP, Exercise Physiologist;Amanda Oletta Darter, BA, ACSM CEP, Exercise Physiologist;Mandi Ballard, BS, PEC    Medication changes reported      No    Fall or balance concerns reported     No    Warm-up and Cool-down  Performed on first and last piece of equipment    Resistance Training Performed  Yes    VAD Patient?  No    PAD/SET Patient?  No      Pain Assessment   Currently in Pain?  No/denies          Social History   Tobacco Use  Smoking Status Never Smoker  Smokeless Tobacco Never Used    Goals Met:  Independence with exercise equipment Exercise tolerated well Personal goals reviewed No report of cardiac concerns or symptoms Strength training completed today  Goals Unmet:  Not Applicable  Comments: Pt able to follow exercise prescription today without complaint.  Will continue to monitor for progression.    Dr. Emily Filbert is Medical Director for [place] and [facility].

## 2018-06-23 ENCOUNTER — Encounter: Payer: Medicare Other | Admitting: *Deleted

## 2018-06-23 DIAGNOSIS — Z955 Presence of coronary angioplasty implant and graft: Secondary | ICD-10-CM

## 2018-06-23 DIAGNOSIS — Z48812 Encounter for surgical aftercare following surgery on the circulatory system: Secondary | ICD-10-CM | POA: Diagnosis not present

## 2018-06-23 NOTE — Progress Notes (Signed)
Daily Session Note  Patient Details  Name: Stacie Mcdonald MRN: [id number] Date of Birth: 1946-07-12 Referring Provider:     Cardiac Rehab from 05/12/2018 in [facility] Cardiac and Pulmonary Rehab  Referring Provider  [facility]      Encounter Date: 06/23/2018  Check In:      Social History   Tobacco Use  Smoking Status Never Smoker  Smokeless Tobacco Never Used    Goals Met:  Independence with exercise equipment Exercise tolerated well No report of cardiac concerns or symptoms Strength training completed today  Goals Unmet:  Not Applicable  Comments: Pt able to follow exercise prescription today without complaint.  Will continue to monitor for progression.    Dr. Emily Filbert is Medical Director for [place] and [facility].

## 2018-06-28 ENCOUNTER — Encounter: Payer: Medicare Other | Admitting: *Deleted

## 2018-06-28 DIAGNOSIS — Z955 Presence of coronary angioplasty implant and graft: Secondary | ICD-10-CM

## 2018-06-28 DIAGNOSIS — Z48812 Encounter for surgical aftercare following surgery on the circulatory system: Secondary | ICD-10-CM | POA: Diagnosis not present

## 2018-06-28 NOTE — Progress Notes (Signed)
Daily Session Note  Patient Details  Name: Stacie Mcdonald MRN: [id number] Date of Birth: January 05, 1946 Referring Provider:     Cardiac Rehab from 05/12/2018 in [facility] Cardiac and Pulmonary Rehab  Referring Provider  [facility]      Encounter Date: 06/28/2018  Check In: Session Check In - 06/28/18 0802      Check-In   Supervising physician immediately available to respond to emergencies  See telemetry face sheet for immediately available ER MD    Location  [facility]-Cardiac & Pulmonary Rehab    Staff Present  Joellyn Rued, BS, PEC;Susanne Bice, RN, BSN, CCRP;Jessica [place], MA, RCEP, CCRP, Exercise Physiologist;Quincie Haroon [place], BA, ACSM CEP, Exercise Physiologist    Medication changes reported      No    Fall or balance concerns reported     No    Tobacco Cessation  No Change    Warm-up and Cool-down  Performed on first and last piece of equipment    Resistance Training Performed  Yes    VAD Patient?  No    PAD/SET Patient?  No      Pain Assessment   Currently in Pain?  No/denies    Pain Score  0-No pain    Multiple Pain Sites  No          Social History   Tobacco Use  Smoking Status Never Smoker  Smokeless Tobacco Never Used    Goals Met:  Independence with exercise equipment Exercise tolerated well No report of cardiac concerns or symptoms Strength training completed today  Goals Unmet:  Not Applicable  Comments: Pt able to follow exercise prescription today without complaint.  Will continue to monitor for progression.    Dr. Emily Filbert is Medical Director for [place] [facility].

## 2018-06-29 ENCOUNTER — Encounter: Payer: Self-pay | Admitting: *Deleted

## 2018-06-29 DIAGNOSIS — Z955 Presence of coronary angioplasty implant and graft: Secondary | ICD-10-CM

## 2018-06-29 NOTE — Progress Notes (Signed)
Cardiac Individual Treatment Plan  Patient Details  Name: Stacie Mcdonald MRN: [id number] Date of Birth: 22-Mar-1946 Referring Provider:     Cardiac Rehab from 05/12/2018 in [facility] Cardiac and Pulmonary Rehab  Referring Provider  [facility]      Initial Encounter Date:    Cardiac Rehab from 05/12/2018 in [facility] Cardiac and Pulmonary Rehab  Date  05/12/18      Visit Diagnosis: Status post coronary artery stent placement  Patient's Home Medications on Admission:  Current Outpatient Medications:  .  aspirin 81 MG tablet, Take 81 mg by mouth daily. , Disp: , Rfl:  .  clopidogrel (PLAVIX) 75 MG tablet, TAKE ONE TABLET BY MOUTH DAILY, Disp: 90 tablet, Rfl: 1 .  digoxin (LANOXIN) 0.125 MG tablet, Take 0.125 mg by mouth daily., Disp: , Rfl:  .  furosemide (LASIX) 40 MG tablet, Take 40 mg by mouth daily., Disp: , Rfl:  .  GLUCOSAMINE-CHONDROITIN PO, Take 2 tablets by mouth daily., Disp: , Rfl:  .  isosorbide mononitrate (IMDUR) 60 MG 24 hr tablet, Take 60 mg by mouth daily., Disp: , Rfl:  .  Lancets (FREESTYLE) lancets, uSE TO CHECK SUGARS ONCE DAILY e11.9, Disp: 100 each, Rfl: 3 .  loratadine (CLARITIN) 10 MG tablet, Take 10 mg by mouth daily as needed for allergies., Disp: , Rfl:  .  losartan (COZAAR) 100 MG tablet, TAKE 1 TABLET (100 MG TOTAL) BY MOUTH DAILY., Disp: 90 tablet, Rfl: 1 .  metoprolol (TOPROL-XL) 200 MG 24 hr tablet, TAKE 1 TABLET BY MOUTH DAILY., Disp: 90 tablet, Rfl: 1 .  niacin 500 MG tablet, Take 1 tablet (500 mg total) by mouth daily., Disp: 90 tablet, Rfl: 1 .  omeprazole (PRILOSEC) 20 MG capsule, Take 1 capsule (20 mg total) by mouth daily. (Patient taking differently: Take 20 mg by mouth daily as needed (acid reflux). ), Disp: 90 capsule, Rfl: 1 .  ONE TOUCH ULTRA TEST test strip, CHECK DAILY, Disp: 100 each, Rfl: 2 .  potassium chloride SA (K-DUR,KLOR-CON) 20 MEQ tablet, Take 1 tablet (20 mEq total) by mouth daily., Disp: 30 tablet, Rfl: 3  Past Medical History: Past  Medical History:  Diagnosis Date  . Allergy   . Arthritis   . Asthma   . Breast Cancer 1991   R mastectomy. XRT , chemo  . Breast cancer ([place]) 1991   RT MASTECTOMY  . CAD (coronary artery disease) 2008   50% LAD occlusion 2009,  stent in mid LAD 2008 for 95%   . Chicken pox   . Colon polyps April 2009   by Colonoscopy, Gustavo Lah  . GERD (gastroesophageal reflux disease)   . Heart disease   . Hyperlipidemia   . Hypertension   . 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 Rheumatic fever   . Urinary tract infection     Tobacco Use: Social History   Tobacco Use  Smoking Status Never Smoker  Smokeless Tobacco Never Used    Labs: Recent Review Flowsheet Data    Labs for ITP Cardiac and Pulmonary Rehab Latest Ref Rng & Units 02/15/2015 06/15/2016 12/15/2016 06/08/2017 01/20/2018   Cholestrol 0 - 200 mg/dL 198 178 191 193 -   LDLCALC 0 - 99 mg/dL 125(H) 116(H) 117(H) 124(H) -   LDLDIRECT mg/dL - 135.0 133.0 142.0 123.0   HDL >39.00 mg/dL 39.60 36.40(L) 35.60(L) 36.40(L) -   Trlycerides 0.0 - 149.0 mg/dL 168.0(H) 125.0 192.0(H) 163.0(H) -   Hemoglobin A1c 4.6 -  6.5 % 6.7(H) 6.5 6.5 6.3 6.5       Exercise Target Goals: Exercise Program Goal: Individual exercise prescription set using results from initial 6 min walk test and THRR while considering  patient's activity barriers and safety.   Exercise Prescription Goal: Initial exercise prescription builds to 30-45 minutes a day of aerobic activity, 2-3 days per week.  Home exercise guidelines will be given to patient during program as part of exercise prescription that the participant will acknowledge.  Activity Barriers & Risk Stratification: Activity Barriers & Cardiac Risk Stratification - 05/12/18 1351      Activity Barriers & Cardiac Risk Stratification   Activity Barriers  Arthritis;Shortness of Breath    Cardiac Risk Stratification  High       6 Minute Walk: 6 Minute Walk    Row Name 05/12/18 1343          6 Minute Walk   Distance  1214 feet     Walk Time  6 minutes     # of Rest Breaks  0     MPH  2.3     METS  2.6     RPE  16     Perceived Dyspnea   3     VO2 Peak  9.1     Symptoms  No     Resting HR  95 bpm     Resting BP  142/70     Resting Oxygen Saturation   93 %     Exercise Oxygen Saturation  during 6 min walk  91 %     Max Ex. HR  113 bpm     Max Ex. BP  152/72     2 Minute Post BP  150/72        Oxygen Initial Assessment: Oxygen Initial Assessment - 05/12/18 1350      Home Oxygen   Sleep Oxygen Prescription  None    Home Exercise Oxygen Prescription  None    Home at Rest Exercise Oxygen Prescription  None      Initial 6 min Walk   Oxygen Used  None      Program Oxygen Prescription   Program Oxygen Prescription  None       Oxygen Re-Evaluation:   Oxygen Discharge (Final Oxygen Re-Evaluation):   Initial Exercise Prescription: Initial Exercise Prescription - 05/12/18 1300      Date of Initial Exercise RX and Referring Provider   Date  05/12/18    Referring Provider  [facility]      Treadmill   MPH  2    Grade  0    Minutes  15    METs  2.5      NuStep   Level  2    SPM  80    Minutes  15    METs  2.6      REL-XR   Level  2    Speed  50    Minutes  15    METs  2.5      Prescription Details   Frequency (times per week)  2    Duration  Progress to 30 minutes of continuous aerobic without signs/symptoms of physical distress      Intensity   THRR 40-80% of Max Heartrate  115-136    Ratings of Perceived Exertion  11-13    Perceived Dyspnea  0-4      Resistance Training   Training Prescription  Yes    Weight  2 lb  Reps  10-15       Perform Capillary Blood Glucose checks as needed.  Exercise Prescription Changes: Exercise Prescription Changes    Row Name 05/12/18 1300 05/24/18 1300 06/02/18 0800 06/08/18 0900 06/21/18 1500     Response to Exercise   Blood Pressure (Admit)  142/70  116/68  -  132/68  134/80   Blood  Pressure (Exercise)  152/70  140/72  -  150/68  128/76   Blood Pressure (Exit)  150/72  148/62  -  132/80  128/64   Heart Rate (Admit)  86 bpm  92 bpm  -  89 bpm  75 bpm   Heart Rate (Exercise)  110 bpm  98 bpm  -  123 bpm  94 bpm   Heart Rate (Exit)  113 bpm  79 bpm  -  81 bpm  73 bpm   Oxygen Saturation (Admit)  93 %  -  -  -  -   Oxygen Saturation (Exercise)  92 %  -  -  -  -   Oxygen Saturation (Exit)  93 %  -  -  -  -   Rating of Perceived Exertion (Exercise)  16  13  -  15  14   Symptoms  -  none  -  none  none   Comments  -  first day of exercise  -  -  -   Duration  -  Continue with 30 min of aerobic exercise without signs/symptoms of physical distress.  -  Continue with 30 min of aerobic exercise without signs/symptoms of physical distress.  Continue with 30 min of aerobic exercise without signs/symptoms of physical distress.   Intensity  -  THRR unchanged  -  THRR unchanged  THRR unchanged     Progression   Progression  -  Continue to progress workloads to maintain intensity without signs/symptoms of physical distress.  -  Continue to progress workloads to maintain intensity without signs/symptoms of physical distress.  Continue to progress workloads to maintain intensity without signs/symptoms of physical distress.   Average METs  -  2.75  -  2.84  2.45     Resistance Training   Training Prescription  -  Yes  -  Yes  Yes   Weight  -  3 lb  -  3 lbs  2 lbs   Reps  -  10-15  -  10-15  10-15     Interval Training   Interval Training  -  No  -  No  No     Treadmill   MPH  -  2  -  2  -   Grade  -  0  -  -  -   Minutes  -  15  -  15  -   METs  -  2.5  -  2.5  -     NuStep   Level  -  -  -  2  2   Minutes  -  -  -  15  15   METs  -  -  -  2.5  2     REL-XR   Level  -  2  -  2  4   Speed  -  50  -  -  -   Minutes  -  15  -  15  15   METs  -  3  -  3.5  2.9  Home Exercise Plan   Plans to continue exercise at  -  -  [employer] (comment) walking and [facility]  [employer] (comment) walking and [facility] [employer] (comment) walking and [facility] [employer]  -  -  Add 3 additional days to program exercise sessions.  Add 3 additional days to program exercise sessions.  Add 3 additional days to program exercise sessions.   Initial Home Exercises Provided  -  -  06/02/18  06/02/18  06/02/18      Exercise Comments: Exercise Comments    Row Name 05/24/18 0809           Exercise Comments  First full day of exercise!  Patient was oriented to gym and equipment including functions, settings, policies, and procedures.  Patient's individual exercise prescription and treatment plan were reviewed.  All starting workloads were established based on the results of the 6 minute walk test done at initial orientation visit.  The plan for exercise progression was also introduced and progression will be customized based on patient's performance and goals.          Exercise Goals and Review: Exercise Goals    Row Name 05/12/18 1343             Exercise Goals   Increase Physical Activity  Yes       Intervention  Provide advice, education, support and counseling about physical activity/exercise needs.;Develop an individualized exercise prescription for aerobic and resistive training based on initial evaluation findings, risk stratification, comorbidities and participant's personal goals.       Expected Outcomes  Short Term: Attend rehab on a regular basis to increase amount of physical activity.;Long Term: Add in home exercise to make exercise part of routine and to increase amount of physical activity.;Long Term: Exercising regularly at least 3-5 days a week.       Increase Strength and Stamina  Yes       Intervention  Provide advice, education, support and counseling about physical activity/exercise needs.;Develop an individualized exercise prescription for aerobic and resistive training based on initial evaluation findings, risk  stratification, comorbidities and participant's personal goals.       Expected Outcomes  Short Term: Increase workloads from initial exercise prescription for resistance, speed, and METs.;Short Term: Perform resistance training exercises routinely during rehab and add in resistance training at home;Long Term: Improve cardiorespiratory fitness, muscular endurance and strength as measured by increased METs and functional capacity (6MWT)       Able to understand and use rate of perceived exertion (RPE) scale  Yes       Intervention  Provide education and explanation on how to use RPE scale       Expected Outcomes  Short Term: Able to use RPE daily in rehab to express subjective intensity level;Long Term:  Able to use RPE to guide intensity level when exercising independently       Able to understand and use Dyspnea scale  Yes       Intervention  Provide education and explanation on how to use Dyspnea scale       Expected Outcomes  Short Term: Able to use Dyspnea scale daily in rehab to express subjective sense of shortness of breath during exertion;Long Term: Able to use Dyspnea scale to guide intensity level when exercising independently       Knowledge and understanding of Target Heart Rate Range (THRR)  Yes  Intervention  Provide education and explanation of THRR including how the numbers were predicted and where they are located for reference       Expected Outcomes  Short Term: Able to state/look up THRR;Short Term: Able to use daily as guideline for intensity in rehab;Long Term: Able to use THRR to govern intensity when exercising independently       Able to check pulse independently  Yes       Intervention  Provide education and demonstration on how to check pulse in carotid and radial arteries.;Review the importance of being able to check your own pulse for safety during independent exercise       Expected Outcomes  Short Term: Able to explain why pulse checking is important during independent  exercise;Long Term: Able to check pulse independently and accurately       Understanding of Exercise Prescription  Yes       Intervention  Provide education, explanation, and written materials on patient's individual exercise prescription       Expected Outcomes  Short Term: Able to explain program exercise prescription;Long Term: Able to explain home exercise prescription to exercise independently          Exercise Goals Re-Evaluation : Exercise Goals Re-Evaluation    Row Name 05/24/18 0809 06/02/18 0833 06/08/18 0943 06/21/18 0800       Exercise Goal Re-Evaluation   Exercise Goals Review  Increase Physical Activity;Increase Strength and Stamina;Able to understand and use rate of perceived exertion (RPE) scale;Knowledge and understanding of Target Heart Rate Range (THRR)  Increase Physical Activity;Increase Strength and Stamina;Able to understand and use rate of perceived exertion (RPE) scale;Knowledge and understanding of Target Heart Rate Range (THRR);Able to check pulse independently;Understanding of Exercise Prescription  Increase Physical Activity;Understanding of Exercise Prescription;Increase Strength and Stamina  Increase Physical Activity;Understanding of Exercise Prescription;Increase Strength and Stamina    Comments  Reviewed RPE scale, THR and program prescription with pt today.  Pt voiced understanding and was given a copy of goals to take home.   Reviewed home exercise with pt today.  Pt plans to continue walking at home for exercise.  She also uses her Silver Sneakers to go to [facility] [employer].  Reviewed THR, pulse, RPE, sign and symptoms, NTG use, and when to call 911 or MD.  Also discussed weather considerations and indoor options.  Pt voiced understanding.  Meerab is off to a good start in rehab.  She has been doing well with all of her workloads and should be ready to start to increase.  We will continue to monitor her progress.   Gaylene is doin well in rehab.  She is going to [facility]  by [place] with her Silver Sneakers three times a week.  She is starting to feel like her strength and stamina are coming back.     Expected Outcomes  Reviewed RPE scale, THR and program prescription with pt today.  Pt voiced understanding and was given a copy of goals to take home.   Short: Continue to exercise on off days.  Long: Continue to build strength and stamina.   Short: Increase workloads!  Long: Continue to build strength and stamina.   Short: Continue to increase workloads.  Long: Continue to execise on off days.        Discharge Exercise Prescription (Final Exercise Prescription Changes): Exercise Prescription Changes - 06/21/18 1500      Response to Exercise   Blood Pressure (Admit)  134/80    Blood Pressure (Exercise)  128/76    Blood Pressure (Exit)  128/64    Heart Rate (Admit)  75 bpm    Heart Rate (Exercise)  94 bpm    Heart Rate (Exit)  73 bpm    Rating of Perceived Exertion (Exercise)  14    Symptoms  none    Duration  Continue with 30 min of aerobic exercise without signs/symptoms of physical distress.    Intensity  THRR unchanged      Progression   Progression  Continue to progress workloads to maintain intensity without signs/symptoms of physical distress.    Average METs  2.45      Resistance Training   Training Prescription  Yes    Weight  2 lbs    Reps  10-15      Interval Training   Interval Training  No      NuStep   Level  2    Minutes  15    METs  2      REL-XR   Level  4    Minutes  15    METs  2.9      Home Exercise Plan   Plans to continue exercise at  [facility] (comment)   walking and [facility] [employer]   Frequency  Add 3 additional days to program exercise sessions.    Initial Home Exercises Provided  06/02/18       Nutrition:  Target Goals: Understanding of nutrition guidelines, daily intake of sodium '1500mg'$ , cholesterol '200mg'$ , calories 30% from fat and 7% or less from saturated fats, daily to have 5 or more servings of  fruits and vegetables.  Biometrics: Pre Biometrics - 05/12/18 1342      Pre Biometrics   Height  '5\' 3"'$  (1.6 m)    Weight  178 lb 3.2 oz (80.8 kg)    Waist Circumference  37.5 inches    Hip Circumference  43.25 inches    Waist to Hip Ratio  0.87 %    BMI (Calculated)  31.57    Single Leg Stand  25 seconds        Nutrition Therapy Plan and Nutrition Goals: Nutrition Therapy & Goals - 05/12/18 1355      Nutrition Therapy   RD appointment deferred  Yes    Drug/Food Interactions  Statins/Certain Fruits      Personal Nutrition Goals   Nutrition Goal  To cont to eat heart healthy.      Intervention Plan   Intervention  Prescribe, educate and counsel regarding individualized specific dietary modifications aiming towards targeted core components such as weight, hypertension, lipid management, diabetes, heart failure and other comorbidities.;Nutrition handout(s) given to patient.    Expected Outcomes  Short Term Goal: Understand basic principles of dietary content, such as calories, fat, sodium, cholesterol and nutrients.;Short Term Goal: A plan has been developed with personal nutrition goals set during dietitian appointment.;Long Term Goal: Adherence to prescribed nutrition plan.       Nutrition Assessments: Nutrition Assessments - 05/12/18 1406      MEDFICTS Scores   Pre Score  --   Patient did not want to fill out form      Nutrition Goals Re-Evaluation: Nutrition Goals Re-Evaluation    [place] Name 06/21/18 0804             Goals   Nutrition Goal  Eat heart healthy       Comment  Janille declines a nutrition appointment.  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  She has been doing well with her blood sugars as well. She tries to eat healthy.       Expected Outcome  Short: Continue to work on diet.  Long: Continue to eat heart healthy.           Nutrition Goals Discharge (Final Nutrition Goals Re-Evaluation): Nutrition Goals Re-Evaluation - 06/21/18 0804       Goals   Nutrition Goal  Eat heart healthy    Comment  Adream declines a nutrition appointment.  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  She has been doing well with her blood sugars as well. She tries to eat healthy.    Expected Outcome  Short: Continue to work on diet.  Long: Continue to eat heart healthy.        Psychosocial: Target Goals: Acknowledge presence or absence of significant depression and/or stress, maximize coping skills, provide positive support system. Participant is able to verbalize types and ability to use techniques and skills needed for reducing stress and depression.   Initial Review & Psychosocial Screening: Initial Psych Review & Screening - 05/12/18 1358      Initial Review   Current issues with  Current Sleep Concerns      Family Dynamics   Good Support System?  Yes      Barriers   Psychosocial barriers to participate in program  The patient should benefit from training in stress management and relaxation.      Screening Interventions   Interventions  Encouraged to exercise;Program counselor consult    Expected Outcomes  Long Term Goal: Stressors or current issues are controlled or eliminated.;Short Term goal: Utilizing psychosocial counselor, staff and physician to assist with identification of specific Stressors or current issues interfering with healing process. Setting desired goal for each stressor or current issue identified.       Quality of Life Scores:  Quality of Life - 05/12/18 1409      Quality of Life   Select  Quality of Life      Quality of Life Scores   Health/Function Pre  22.87 %    Socioeconomic Pre  24.43 %    Psych/Spiritual Pre  26.79 %    Family Pre  27.6 %    GLOBAL Pre  24.69 %      Scores of 19 and below usually indicate a poorer quality of life in these areas.  A difference of  2-3 points is a clinically meaningful difference.  A difference of 2-3 points in the total score of the Quality of Life Index has  been associated with significant improvement in overall quality of life, self-image, physical symptoms, and general health in studies assessing change in quality of life.  PHQ-9: Recent Review Flowsheet Data    Depression screen [facility] 2/9 06/09/2018 05/12/2018 01/06/2018 06/08/2017 06/08/2016   Decreased Interest 0 0 0 0 0   Down, Depressed, Hopeless 0 0 0 0 0   PHQ - 2 Score 0 0 0 0 0   Altered sleeping - 1 - 0 -   Tired, decreased energy - 2 - 0 -   Change in appetite - 0 - 0 -   Feeling bad or failure about yourself  - 0 - 0 -   Trouble concentrating - 0 - 0 -   Moving slowly or fidgety/restless - 0 - 0 -   Suicidal thoughts - 0 - 0 -   PHQ-9 Score -  3 - 0 -   Difficult doing work/chores - Somewhat difficult - - -     Interpretation of Total Score  Total Score Depression Severity:  1-4 = Minimal depression, 5-9 = Mild depression, 10-14 = Moderate depression, 15-19 = Moderately severe depression, 20-27 = Severe depression   Psychosocial Evaluation and Intervention: Psychosocial Evaluation - 06/21/18 1042      Psychosocial Evaluation & Interventions   Interventions  Encouraged to exercise with the program and follow exercise prescription;Stress management education    Comments  Counselor met with [name] Mahr) today for initial psychosocial evaluation.  She is a 72 year old who had a stent inserted in May.  Terrel has a strong support system with a spouse of 61 years; a son who lives locally and active involvement in her local church.  She is a breast cancer survivor over 20 years and reports the her heart condition and lung condition are a result of the chemo she was on at that time.  She is not sleeping well - reporting maybe 4 hours intermittently.  She has a good appetite and denies a history of depression or anxiety or any current symptoms.  Coreena states other than her own health and her spouse's health she has minimal stress in her life and is typically in a positive mood.  She has  goals to increase her energy while in this program.    Expected Outcomes  Short:  Nakshatra will speak with her Dr. soon about her chronic sleep problem.  She will also exercise regularly to increase her energy.   Long:  Jacobi will develop a habit of exercise and healthy eating for her long term health benefits - including managing stress.      Continue Psychosocial Services   Follow up required by staff       Psychosocial Re-Evaluation:   Psychosocial Discharge (Final Psychosocial Re-Evaluation):   Vocational Rehabilitation: Provide vocational rehab assistance to qualifying candidates.   Vocational Rehab Evaluation & Intervention: Vocational Rehab - 05/12/18 1337      Initial Vocational Rehab Evaluation & Intervention   Assessment shows need for Vocational Rehabilitation  No       Education: Education Goals: Education classes will be provided on a variety of topics geared toward better understanding of heart health and risk factor modification. Participant will state understanding/return demonstration of topics presented as noted by education test scores.  Learning Barriers/Preferences: Learning Barriers/Preferences - 05/12/18 1337      Learning Barriers/Preferences   Learning Barriers  None    Learning Preferences  Written Material       Education Topics:  AED/CPR: - Group verbal and written instruction with the use of models to demonstrate the basic use of the AED with the basic ABC's of resuscitation.   Cardiac Rehab from 06/28/2018 in [facility] Cardiac and Pulmonary Rehab  Date  06/02/18  Educator  SB  Instruction Review Code  1- Verbalizes Understanding      General Nutrition Guidelines/Fats and Fiber: -Group instruction provided by verbal, written material, models and posters to present the general guidelines for heart healthy nutrition. Gives an explanation and review of dietary fats and fiber.   Cardiac Rehab from 06/28/2018 in [facility] Cardiac and Pulmonary Rehab  Date   05/31/18  Educator  CR  Instruction Review Code  1- Verbalizes Understanding      Controlling Sodium/Reading Food Labels: -Group verbal and written material supporting the discussion of sodium use in heart healthy nutrition. Review and explanation  with models, verbal and written materials for utilization of the food label.   Cardiac Rehab from 06/28/2018 in [facility] Cardiac and Pulmonary Rehab  Date  06/07/18  Educator  SB  Instruction Review Code  5- Refused Teaching      Exercise Physiology & General Exercise Guidelines: - Group verbal and written instruction with models to review the exercise physiology of the cardiovascular system and associated critical values. Provides general exercise guidelines with specific guidelines to those with heart or lung disease.    Aerobic Exercise & Resistance Training: - Gives group verbal and written instruction on the various components of exercise. Focuses on aerobic and resistive training programs and the benefits of this training and how to safely progress through these programs..   Cardiac Rehab from 06/28/2018 in [facility] Cardiac and Pulmonary Rehab  Date  06/23/18  Educator  AS  Instruction Review Code  5- Refused Teaching      Flexibility, Balance, Mind/Body Relaxation: Provides group verbal/written instruction on the benefits of flexibility and balance training, including mind/body exercise modes such as yoga, pilates and tai chi.  Demonstration and skill practice provided.   Cardiac Rehab from 06/28/2018 in [facility] Cardiac and Pulmonary Rehab  Date  06/28/18  Educator  AS  Instruction Review Code  5- Refused Teaching      Stress and Anxiety: - Provides group verbal and written instruction about the health risks of elevated stress and causes of high stress.  Discuss the correlation between heart/lung disease and anxiety and treatment options. Review healthy ways to manage with stress and anxiety.   Cardiac Rehab from 06/28/2018 in [facility] Cardiac  and Pulmonary Rehab  Date  05/24/18  Educator  SB  Instruction Review Code  1- Verbalizes Understanding      Depression: - Provides group verbal and written instruction on the correlation between heart/lung disease and depressed mood, treatment options, and the stigmas associated with seeking treatment.   Cardiac Rehab from 06/28/2018 in [facility] Cardiac and Pulmonary Rehab  Date  06/21/18  Educator  [facility]  Instruction Review Code  1- Verbalizes Understanding      Anatomy & Physiology of the Heart: - Group verbal and written instruction and models provide basic cardiac anatomy and physiology, with the coronary electrical and arterial systems. Review of Valvular disease and Heart Failure   Cardiac Rehab from 06/28/2018 in [facility] Cardiac and Pulmonary Rehab  Date  05/26/18  Educator  SB  Instruction Review Code  5- Refused Teaching      Cardiac Procedures: - Group verbal and written instruction to review commonly prescribed medications for heart disease. Reviews the medication, class of the drug, and side effects. Includes the steps to properly store meds and maintain the prescription regimen. (beta blockers and nitrates)   Cardiac Medications I: - Group verbal and written instruction to review commonly prescribed medications for heart disease. Reviews the medication, class of the drug, and side effects. Includes the steps to properly store meds and maintain the prescription regimen.   Cardiac Medications II: -Group verbal and written instruction to review commonly prescribed medications for heart disease. Reviews the medication, class of the drug, and side effects. (all other drug classes)    Go Sex-Intimacy & Heart Disease, Get SMART - Goal Setting: - Group verbal and written instruction through game format to discuss heart disease and the return to sexual intimacy. Provides group verbal and written material to discuss and apply goal setting through the application of the S.M.A.R.T.  Method.   Other  Matters of the Heart: - Provides group verbal, written materials and models to describe Stable Angina and Peripheral Artery. Includes description of the disease process and treatment options available to the cardiac patient.   Cardiac Rehab from 06/28/2018 in [facility] Cardiac and Pulmonary Rehab  Date  05/26/18  Educator  SB  Instruction Review Code  5- Refused Teaching      Exercise & Equipment Safety: - Individual verbal instruction and demonstration of equipment use and safety with use of the equipment.   Cardiac Rehab from 06/28/2018 in [facility] Cardiac and Pulmonary Rehab  Date  05/12/18  Educator  C. Enterkin,RN  Instruction Review Code  1- Verbalizes Understanding      Infection Prevention: - Provides verbal and written material to individual with discussion of infection control including proper hand washing and proper equipment cleaning during exercise session.   Cardiac Rehab from 06/28/2018 in [facility] Cardiac and Pulmonary Rehab  Date  05/12/18  Educator  [facility]  Instruction Review Code  1- Verbalizes Understanding      Falls Prevention: - Provides verbal and written material to individual with discussion of falls prevention and safety.   Cardiac Rehab from 06/28/2018 in [facility] Cardiac and Pulmonary Rehab  Date  05/12/18  Educator  C. Enterkin  Instruction Review Code  1- Verbalizes Understanding      Diabetes: - Individual verbal and written instruction to review signs/symptoms of diabetes, desired ranges of glucose level fasting, after meals and with exercise. Acknowledge that pre and post exercise glucose checks will be done for 3 sessions at entry of program.   Know Your Numbers and Risk Factors: -Group verbal and written instruction about important numbers in your health.  Discussion of what are risk factors and how they play a role in the disease process.  Review of Cholesterol, Blood Pressure, Diabetes, and BMI and the role they play in your overall  health.   Sleep Hygiene: -Provides group verbal and written instruction about how sleep can affect your health.  Define sleep hygiene, discuss sleep cycles and impact of sleep habits. Review good sleep hygiene tips.    Other: -Provides group and verbal instruction on various topics (see comments)   Knowledge Questionnaire Score: Knowledge Questionnaire Score - 05/12/18 1409      Knowledge Questionnaire Score   Pre Score  26/26       Core Components/Risk Factors/Patient Goals at Admission: Personal Goals and Risk Factors at Admission - 05/12/18 1356      Core Components/Risk Factors/Patient Goals on Admission   Improve shortness of breath with ADL's  Yes    Intervention  Provide education, individualized exercise plan and daily activity instruction to help decrease symptoms of SOB with activities of daily living.    Expected Outcomes  Short Term: Improve cardiorespiratory fitness to achieve a reduction of symptoms when performing ADLs;Long Term: Be able to perform more ADLs without symptoms or delay the onset of symptoms    Heart Failure  Yes   History of heart failure in the past   Intervention  Provide a combined exercise and nutrition program that is supplemented with education, support and counseling about heart failure. Directed toward relieving symptoms such as shortness of breath, decreased exercise tolerance, and extremity edema.    Expected Outcomes  Improve functional capacity of life;Short term: Attendance in program 2-3 days a week with increased exercise capacity. Reported lower sodium intake. Reported increased fruit and vegetable intake. Reports medication compliance.;Short term: Daily weights obtained and reported for increase. Utilizing  diuretic protocols set by physician.;Long term: Adoption of self-care skills and reduction of barriers for early signs and symptoms recognition and intervention leading to self-care maintenance.    Lipids  Yes    Intervention  Provide  education and support for participant on nutrition & aerobic/resistive exercise along with prescribed medications to achieve LDL '70mg'$ , HDL >'40mg'$ .    Expected Outcomes  Short Term: Participant states understanding of desired cholesterol values and is compliant with medications prescribed. Participant is following exercise prescription and nutrition guidelines.;Long Term: Cholesterol controlled with medications as prescribed, with individualized exercise RX and with personalized nutrition plan. Value goals: LDL < '70mg'$ , HDL > 40 mg.       Core Components/Risk Factors/Patient Goals Review:  Goals and Risk Factor Review    Row Name 06/21/18 0802             Core Components/Risk Factors/Patient Goals Review   Personal Goals Review  Weight Management/Obesity;Heart Failure;Hypertension       Review  Sagan's weight has been holding steady.  She has not been having any heart failure symptoms at home.  She is staying on top of it with taking her dieuretic daily.   Her blood pressures have been getting better recently.  She checks them on occasion at home.         Expected Outcomes  Short: Continue to work on weight loss.  Long: Continue to manage her heart failure.            Core Components/Risk Factors/Patient Goals at Discharge (Final Review):  Goals and Risk Factor Review - 06/21/18 0802      Core Components/Risk Factors/Patient Goals Review   Personal Goals Review  Weight Management/Obesity;Heart Failure;Hypertension    Review  Raseel's weight has been holding steady.  She has not been having any heart failure symptoms at home.  She is staying on top of it with taking her dieuretic daily.   Her blood pressures have been getting better recently.  She checks them on occasion at home.      Expected Outcomes  Short: Continue to work on weight loss.  Long: Continue to manage her heart failure.         ITP Comments: ITP Comments    Row Name 05/12/18 1346 05/12/18 1403 06/01/18 0626 06/29/18  0810     ITP Comments  Joslynn is concerned about her oxgyen level since she is short of breath still walking at times. We will have her on a cont. pulse ox even on Room Air. Anaih noticed that her pulse ox briefly on her 6 minute walk was 89% on Room air.  Riki has had 10/10 severe neck pain before her Cat Scan of her neck.  Jarely reports that she has chronic neck pain of 3/10 all the time. She has tried massage and other things but still  has the pain. Aleaya doesn't want to take stron pain medicines. Elvena recently had skin grafts done since she fell asleep with a heating pad. 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 It didn't hurt her to walk for her 6 minute walk in the hallway.   Med Review completed. Initial ITP created. Diagnosis can be found in [facility] 5/23  30 day review. Continue with ITP unless directed changes per Medical Director review.  New to program  30 day review completed. ITP sent to Dr. Ramonita Lab, covering for Dr. Emily Filbert, Medical Director of Cardiac Rehab. Continue with ITP unless  changes are made by physician       Comments: 30 day review

## 2018-06-30 ENCOUNTER — Encounter: Payer: Medicare Other | Admitting: *Deleted

## 2018-06-30 DIAGNOSIS — Z955 Presence of coronary angioplasty implant and graft: Secondary | ICD-10-CM

## 2018-06-30 DIAGNOSIS — Z48812 Encounter for surgical aftercare following surgery on the circulatory system: Secondary | ICD-10-CM | POA: Diagnosis not present

## 2018-06-30 NOTE — Progress Notes (Signed)
Daily Session Note  Patient Details  Name: Stacie Mcdonald MRN: [id number] Date of Birth: 05-06-1946 Referring Provider:     Cardiac Rehab from 05/12/2018 in [facility] Cardiac and Pulmonary Rehab  Referring Provider  [facility]      Encounter Date: 06/30/2018  Check In: Session Check In - 06/30/18 0806      Check-In   Supervising physician immediately available to respond to emergencies  See telemetry face sheet for immediately available ER MD    Location  [facility]-Cardiac & Pulmonary Rehab    Staff Present  Joellyn Rued, BS, PEC;Carroll Enterkin, RN, BSN;Genean Adamski [place], MA, RCEP, CCRP, Exercise Physiologist;Amanda Oletta Darter, BA, ACSM CEP, Exercise Physiologist    Medication changes reported      No    Fall or balance concerns reported     No    Warm-up and Cool-down  Performed on first and last piece of equipment    Resistance Training Performed  Yes    VAD Patient?  No    PAD/SET Patient?  No      Pain Assessment   Currently in Pain?  No/denies          Social History   Tobacco Use  Smoking Status Never Smoker  Smokeless Tobacco Never Used    Goals Met:  Independence with exercise equipment Exercise tolerated well No report of cardiac concerns or symptoms Strength training completed today  Goals Unmet:  Not Applicable  Comments: Pt able to follow exercise prescription today without complaint.  Will continue to monitor for progression.    Dr. Emily Filbert is Medical Director for [place] and [facility].

## 2018-07-05 ENCOUNTER — Encounter: Payer: Medicare Other | Admitting: *Deleted

## 2018-07-05 DIAGNOSIS — Z48812 Encounter for surgical aftercare following surgery on the circulatory system: Secondary | ICD-10-CM | POA: Diagnosis not present

## 2018-07-05 DIAGNOSIS — Z955 Presence of coronary angioplasty implant and graft: Secondary | ICD-10-CM | POA: Diagnosis not present

## 2018-07-05 NOTE — Progress Notes (Signed)
Daily Session Note  Patient Details  Name: Stacie Mcdonald MRN: [id number] Date of Birth: 03-16-1946 Referring Provider:     Cardiac Rehab from 05/12/2018 in [facility] Cardiac and Pulmonary Rehab  Referring Provider  [facility]      Encounter Date: 07/05/2018  Check In: Session Check In - 07/05/18 0810      Check-In   Supervising physician immediately available to respond to emergencies  See telemetry face sheet for immediately available ER MD    Location  [facility]-Cardiac & Pulmonary Rehab    Staff Present  Darel Hong, RN BSN;Jessica Luan Pulling, MA, RCEP, CCRP, Exercise Physiologist;Nyonna Hargrove Oletta Darter, BA, ACSM CEP, Exercise Physiologist;Mandi Ballard, BS, PEC          Social History   Tobacco Use  Smoking Status Never Smoker  Smokeless Tobacco Never Used    Goals Met:  Independence with exercise equipment Exercise tolerated well Personal goals reviewed Strength training completed today  Goals Unmet:  Not Applicable  Comments: Pt able to follow exercise prescription today without complaint.  Will continue to monitor for progression.    Dr. Emily Filbert is Medical Director for [place] and [facility].

## 2018-07-07 DIAGNOSIS — Z955 Presence of coronary angioplasty implant and graft: Secondary | ICD-10-CM | POA: Diagnosis not present

## 2018-07-07 DIAGNOSIS — Z48812 Encounter for surgical aftercare following surgery on the circulatory system: Secondary | ICD-10-CM | POA: Diagnosis not present

## 2018-07-07 NOTE — Progress Notes (Signed)
Daily Session Note  Patient Details  Name: Stacie Mcdonald MRN: [id number] Date of Birth: June 06, 1946 Referring Provider:     Cardiac Rehab from 05/12/2018 in [facility] Cardiac and Pulmonary Rehab  Referring Provider  [facility]      Encounter Date: 07/07/2018  Check In:      Social History   Tobacco Use  Smoking Status Never Smoker  Smokeless Tobacco Never Used    Goals Met:  Independence with exercise equipment Exercise tolerated well No report of cardiac concerns or symptoms Strength training completed today  Goals Unmet:  Not Applicable  Comments: Pt able to follow exercise prescription today without complaint.  Will continue to monitor for progression.    Dr. Emily Filbert is Medical Director for [place] and [facility].

## 2018-07-12 ENCOUNTER — Encounter: Payer: Medicare Other | Admitting: *Deleted

## 2018-07-12 DIAGNOSIS — Z955 Presence of coronary angioplasty implant and graft: Secondary | ICD-10-CM

## 2018-07-12 DIAGNOSIS — Z48812 Encounter for surgical aftercare following surgery on the circulatory system: Secondary | ICD-10-CM | POA: Diagnosis not present

## 2018-07-12 NOTE — Progress Notes (Signed)
Daily Session Note  Patient Details  Name: Stacie Mcdonald MRN: [id number] Date of Birth: [birth year] Referring Provider:     Cardiac Rehab from 05/12/2018 in [facility] Cardiac and Pulmonary Rehab  Referring Provider  [facility]      Encounter Date: 07/12/2018  Check In: Session Check In - 07/12/18 0758      Check-In   Supervising physician immediately available to respond to emergencies  See telemetry face sheet for immediately available ER MD    Location  [facility]-Cardiac & Pulmonary Rehab    Staff Present  Joellyn Rued, BS, PEC;Susanne Bice, RN, BSN, CCRP;Jessica [place], MA, RCEP, CCRP, Exercise Physiologist;Rossanna Spitzley Sommer, BA, ACSM CEP, Exercise Physiologist    Medication changes reported      No    Fall or balance concerns reported     No    Tobacco Cessation  Use Increase    Warm-up and Cool-down  Performed on first and last piece of equipment    Resistance Training Performed  Yes    VAD Patient?  No    PAD/SET Patient?  No      Pain Assessment   Currently in Pain?  No/denies    Pain Score  0-No pain    Multiple Pain Sites  No          Social History   Tobacco Use  Smoking Status Never Smoker  Smokeless Tobacco Never Used    Goals Met:  Independence with exercise equipment Exercise tolerated well No report of cardiac concerns or symptoms Strength training completed today  Goals Unmet:  Not Applicable  Comments: Pt able to follow exercise prescription today without complaint.  Will continue to monitor for progression.    Dr. Emily Filbert is Medical Director for [place] and [facility].

## 2018-07-14 ENCOUNTER — Encounter: Payer: Medicare Other | Admitting: *Deleted

## 2018-07-14 DIAGNOSIS — Z48812 Encounter for surgical aftercare following surgery on the circulatory system: Secondary | ICD-10-CM | POA: Diagnosis not present

## 2018-07-14 DIAGNOSIS — Z955 Presence of coronary angioplasty implant and graft: Secondary | ICD-10-CM

## 2018-07-14 NOTE — Progress Notes (Signed)
Daily Session Note  Patient Details  Name: Gwyneth T [place] MRN: [id number] Date of Birth: [birth year] Referring Provider:     Cardiac Rehab from 05/12/2018 in [facility] Cardiac and Pulmonary Rehab  Referring Provider  [facility]      Encounter Date: 07/14/2018  Check In: Session Check In - 07/14/18 0800      Check-In   Supervising physician immediately available to respond to emergencies  See telemetry face sheet for immediately available ER MD    Location  [facility]-Cardiac & Pulmonary Rehab    Staff Present  Nyoka Cowden, RN, BSN, MA;Carroll Enterkin, RN, BSN;Jessica Luan [place]ing, MA, RCEP, CCRP, Exercise Physiologist;Osmel Dykstra Oletta Darter, BA, ACSM CEP, Exercise Physiologist    Medication changes reported      No    Fall or balance concerns reported     No    Tobacco Cessation  No Change    Warm-up and Cool-down  Performed on first and last piece of equipment    Resistance Training Performed  Yes    VAD Patient?  No    PAD/SET Patient?  No      Pain Assessment   Currently in Pain?  No/denies    Pain Score  0-No pain    Multiple Pain Sites  No          Social History   Tobacco Use  Smoking Status Never Smoker  Smokeless Tobacco Never Used    Goals Met:  Independence with exercise equipment Exercise tolerated well No report of cardiac concerns or symptoms Strength training completed today  Goals Unmet:  Not Applicable  Comments: Pt able to follow exercise prescription today without complaint.  Will continue to monitor for progression.    Dr. Emily Filbert is Medical Director for [place] and [facility].

## 2018-07-19 ENCOUNTER — Encounter: Payer: Medicare Other | Attending: Internal Medicine | Admitting: *Deleted

## 2018-07-19 DIAGNOSIS — Z48812 Encounter for surgical aftercare following surgery on the circulatory system: Secondary | ICD-10-CM | POA: Insufficient documentation

## 2018-07-19 DIAGNOSIS — Z955 Presence of coronary angioplasty implant and graft: Secondary | ICD-10-CM | POA: Insufficient documentation

## 2018-07-19 NOTE — Progress Notes (Signed)
Daily Session Note  Patient Details  Name: Stacie Mcdonald MRN: [id number] Date of Birth: 1946-05-01 Referring Provider:     Cardiac Rehab from 05/12/2018 in [facility] Cardiac and Pulmonary Rehab  Referring Provider  [facility]      Encounter Date: 07/19/2018  Check In: Session Check In - 07/19/18 0820      Check-In   Supervising physician immediately available to respond to emergencies  See telemetry face sheet for immediately available ER MD    Location  [facility]-Cardiac & Pulmonary Rehab    Staff Present  Nyoka Cowden, RN, BSN, [place], MA, RCEP, CCRP, Exercise Physiologist;Amanda Oletta Darter, [place], ACSM CEP, Exercise Physiologist    Medication changes reported      No    Fall or balance concerns reported     No    Warm-up and Cool-down  Performed on first and last piece of equipment    Resistance Training Performed  Yes    VAD Patient?  No    PAD/SET Patient?  No      Pain Assessment   Currently in Pain?  No/denies          Social History   Tobacco Use  Smoking Status Never Smoker  Smokeless Tobacco Never Used    Goals Met:  Independence with exercise equipment Exercise tolerated well No report of cardiac concerns or symptoms Strength training completed today  Goals Unmet:  Not Applicable  Comments: Pt able to follow exercise prescription today without complaint.  Will continue to monitor for progression.    Dr. Emily Filbert is Medical Director for [place] and [facility].

## 2018-07-26 DIAGNOSIS — Z955 Presence of coronary angioplasty implant and graft: Secondary | ICD-10-CM

## 2018-07-26 DIAGNOSIS — Z48812 Encounter for surgical aftercare following surgery on the circulatory system: Secondary | ICD-10-CM | POA: Diagnosis not present

## 2018-07-26 NOTE — Progress Notes (Signed)
Daily Session Note  Patient Details  Name: Stacie Mcdonald MRN: [id number] Date of Birth: [birth year] Referring Provider:     Cardiac Rehab from 05/12/2018 in [facility] Cardiac and Pulmonary Rehab  Referring Provider  [facility]      Encounter Date: 07/26/2018  Check In: Session Check In - 07/26/18 0830      Check-In   Supervising physician immediately available to respond to emergencies  See telemetry face sheet for immediately available ER MD    Location  [facility]-Cardiac & Pulmonary Rehab    Staff Present  Nyoka Cowden, RN, BSN, Willette Pa, MA, RCEP, CCRP, Exercise Physiologist;Amanda Oletta Darter, [place], ACSM CEP, Exercise Physiologist    Medication changes reported      No    Fall or balance concerns reported     No    Warm-up and Cool-down  Performed on first and last piece of equipment    Resistance Training Performed  Yes    VAD Patient?  No    PAD/SET Patient?  No      Pain Assessment   Currently in Pain?  No/denies    Multiple Pain Sites  No          Social History   Tobacco Use  Smoking Status Never Smoker  Smokeless Tobacco Never Used    Goals Met:  Independence with exercise equipment Exercise tolerated well No report of cardiac concerns or symptoms Strength training completed today  Goals Unmet:  Not Applicable  Comments: Pt able to follow exercise prescription today without complaint.  Will continue to monitor for progression.    Dr. Emily Filbert is Medical Director for [place] and [facility].

## 2018-07-27 ENCOUNTER — Encounter: Payer: Self-pay | Admitting: Internal Medicine

## 2018-07-27 ENCOUNTER — Ambulatory Visit (INDEPENDENT_AMBULATORY_CARE_PROVIDER_SITE_OTHER): Payer: Medicare Other | Admitting: Internal Medicine

## 2018-07-27 ENCOUNTER — Encounter: Payer: Self-pay | Admitting: *Deleted

## 2018-07-27 VITALS — BP 120/72 | HR 83 | Temp 98.5°F | Resp 15 | Ht 63.0 in | Wt 176.2 lb

## 2018-07-27 DIAGNOSIS — E119 Type 2 diabetes mellitus without complications: Secondary | ICD-10-CM

## 2018-07-27 DIAGNOSIS — R9389 Abnormal findings on diagnostic imaging of other specified body structures: Secondary | ICD-10-CM

## 2018-07-27 DIAGNOSIS — M5386 Other specified dorsopathies, lumbar region: Secondary | ICD-10-CM | POA: Diagnosis not present

## 2018-07-27 DIAGNOSIS — Z23 Encounter for immunization: Secondary | ICD-10-CM | POA: Diagnosis not present

## 2018-07-27 DIAGNOSIS — I25119 Atherosclerotic heart disease of native coronary artery with unspecified angina pectoris: Secondary | ICD-10-CM | POA: Diagnosis not present

## 2018-07-27 DIAGNOSIS — M4802 Spinal stenosis, cervical region: Secondary | ICD-10-CM

## 2018-07-27 DIAGNOSIS — E785 Hyperlipidemia, unspecified: Secondary | ICD-10-CM

## 2018-07-27 DIAGNOSIS — M9981 Other biomechanical lesions of cervical region: Secondary | ICD-10-CM

## 2018-07-27 DIAGNOSIS — M818 Other osteoporosis without current pathological fracture: Secondary | ICD-10-CM | POA: Diagnosis not present

## 2018-07-27 DIAGNOSIS — Z7901 Long term (current) use of anticoagulants: Secondary | ICD-10-CM

## 2018-07-27 DIAGNOSIS — Z853 Personal history of malignant neoplasm of breast: Secondary | ICD-10-CM | POA: Diagnosis not present

## 2018-07-27 DIAGNOSIS — Z955 Presence of coronary angioplasty implant and graft: Secondary | ICD-10-CM

## 2018-07-27 DIAGNOSIS — E1169 Type 2 diabetes mellitus with other specified complication: Secondary | ICD-10-CM

## 2018-07-27 DIAGNOSIS — T386X5A Adverse effect of antigonadotrophins, antiestrogens, antiandrogens, not elsewhere classified, initial encounter: Secondary | ICD-10-CM

## 2018-07-27 LAB — COMPREHENSIVE METABOLIC PANEL
ALK PHOS: 48 U/L (ref 39–117)
ALT: 24 U/L (ref 0–35)
AST: 16 U/L (ref 0–37)
Albumin: 4.7 g/dL (ref 3.5–5.2)
BUN: 17 mg/dL (ref 6–23)
CHLORIDE: 102 meq/L (ref 96–112)
CO2: 29 mEq/L (ref 19–32)
Calcium: 9.5 mg/dL (ref 8.4–10.5)
Creatinine, Ser: 0.62 mg/dL (ref 0.40–1.20)
GFR: 100.13 mL/min (ref >60.00)
GLUCOSE: 104 mg/dL — AB (ref 70–99)
POTASSIUM: 3.8 meq/L (ref 3.5–5.1)
SODIUM: 139 meq/L (ref 135–145)
TOTAL PROTEIN: 7.7 g/dL (ref 6.0–8.3)
Total Bilirubin: 0.6 mg/dL (ref 0.2–1.2)

## 2018-07-27 LAB — CBC WITH DIFFERENTIAL/PLATELET
BASOS PCT: 0.8 % (ref 0.0–3.0)
Basophils Absolute: 0.1 10*3/uL (ref 0.0–0.1)
EOS PCT: 2.5 % (ref 0.0–5.0)
Eosinophils Absolute: 0.2 10*3/uL (ref 0.0–0.7)
HCT: 42.6 % (ref 36.0–46.0)
HEMOGLOBIN: 14.4 g/dL (ref 12.0–15.0)
LYMPHS ABS: 2.9 10*3/uL (ref 0.7–4.0)
Lymphocytes Relative: 36.5 % (ref 12.0–46.0)
MCHC: 33.7 g/dL (ref 30.0–36.0)
MCV: 89.5 fl (ref 78.0–100.0)
MONO ABS: 0.5 10*3/uL (ref 0.1–1.0)
Monocytes Relative: 6.7 % (ref 3.0–12.0)
NEUTROS PCT: 53.5 % (ref 43.0–77.0)
Neutro Abs: 4.3 10*3/uL (ref 1.4–7.7)
Platelets: 265 10*3/uL (ref 150.0–400.0)
RBC: 4.76 Mil/uL (ref 3.87–5.11)
RDW: 14 % (ref 11.5–15.5)
WBC: 8 10*3/uL (ref 4.0–10.5)

## 2018-07-27 LAB — LIPID PANEL
CHOLESTEROL: 193 mg/dL (ref 0–200)
HDL: 38.5 mg/dL — ABNORMAL LOW (ref >39.00)
LDL CALC: 117 mg/dL — AB (ref 0–99)
NONHDL: 154.28
Total CHOL/HDL Ratio: 5
Triglycerides: 185 mg/dL — ABNORMAL HIGH (ref 0.0–149.0)
VLDL: 37 mg/dL (ref 0.0–40.0)

## 2018-07-27 LAB — HEMOGLOBIN A1C: HEMOGLOBIN A1C: 6.6 % — AB (ref 4.6–6.5)

## 2018-07-27 MED ORDER — DIAZEPAM 10 MG PO TABS: 10.0000 mg | ORAL_TABLET | Freq: Every evening | ORAL | 1 refills | Status: DC | PRN

## 2018-07-27 MED ORDER — ZOSTER VAC RECOMB ADJUVANTED 50 MCG/0.5ML IM SUSR: 0.5000 mL | Freq: Once | INTRAMUSCULAR | 1 refills | Status: AC

## 2018-07-27 NOTE — Patient Instructions (Addendum)
Trial of valium at bedtime for sciatica  You can add 400 mg motrin or one aleve as a trial to see if it helps   You can add up to 2000 mg of acetominophen (tylenol) every day safely  In divided doses (500 mg every 6 hours  Or 1000 mg every 12 hours.)   If the NSAID helps,  We can continue it and add a daily prophylactic PPI for stomach protection (protonix)   We should obtain lumbar MRI prior to follow up with [facility]   Try the SOLA low carb bread ([employer]  Frozen bread )  3 g/slice  Tastes great!   CT chest due for repeat in December

## 2018-07-27 NOTE — Progress Notes (Signed)
Subjective:  Patient ID: Stacie Mcdonald, female    DOB: [birth year]  Age: 72 y.o. MRN: [id number]  CC: The primary encounter diagnosis was Foraminal stenosis of cervical region. 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  HPI Stacie Mcdonald presents for 6 month follow up on DM ,  Hypertension, hyperlipidemia, and  CAD  CAD: she was  referred to cardiology during preoperative clearance due to symptoms  Of dyspnea equivalent angina and underwent L & R catheterization  s/p PTCA/stent in May 2019 .  A DES was placed in the RCA for an 80% stenosis . Previously placed LAD stent was patent and a mid LAD lesion of 50% stenosis was not stented.  Mean PA 33 wedge 13  Ef 50% apical hypokinesis.  Neurosurgery postponed a year due to stent placement.     . Pulmonary nodules,  6 month folow up chest CT for pulmonary nodules due in December    Referred to [facility] in May for cervical disk dz   Follow up in march or May for high risk surgery (needs to stay on plavix for a year from stent ) pain is improvedbut not resolved.  Takes 2 tylenol daily  . Now having lower back pain that radiates down her right  lateral leg that is triggered by sleeping on her side even if she switches sides   Saw Dr Gloriann Loan for eye august 5   exercising 5 days per week heart track 2/week 40 minutes of aerobics  And weights   Outpatient Medications Prior to Visit  Medication Sig  Dispense Refill  . aspirin 81 MG tablet Take 81 mg by mouth daily.     . clopidogrel (PLAVIX) 75 MG tablet TAKE ONE TABLET BY MOUTH DAILY 90 tablet 1  . digoxin (LANOXIN) 0.125 MG tablet Take 0.125 mg by mouth daily.    . furosemide (LASIX) 40 MG tablet Take 40 mg by mouth daily.    Marland Kitchen GLUCOSAMINE-CHONDROITIN PO Take 2 tablets by mouth daily.    . isosorbide mononitrate (IMDUR) 60 MG 24 hr tablet Take 60 mg by mouth daily.    . Lancets (FREESTYLE) lancets uSE TO CHECK SUGARS ONCE DAILY e11.9 100 each 3  . loratadine (CLARITIN) 10 MG tablet Take 10 mg by mouth daily as needed for allergies.    Marland Kitchen losartan (COZAAR) 100 MG tablet TAKE 1 TABLET (100 MG TOTAL) BY MOUTH DAILY. 90 tablet 1  . metoprolol (TOPROL-XL) 200 MG 24 hr tablet TAKE 1 TABLET BY MOUTH DAILY. 90 tablet 1  . niacin 500 MG tablet Take 1 tablet (500 mg total) by mouth daily. 90 tablet 1  . omeprazole (PRILOSEC) 20 MG capsule Take 1 capsule (20 mg total) by mouth daily. (Patient taking differently: Take 20 mg by mouth daily as needed (acid reflux). ) 90 capsule 1  . ONE TOUCH ULTRA TEST test strip CHECK DAILY 100 each 2  . potassium chloride SA (K-DUR,KLOR-CON) 20 MEQ tablet Take 1 tablet (20 mEq total) by mouth daily. 30 tablet 3   No  facility-administered medications prior to visit.     Review of Systems;  Patient denies headache, fevers, malaise, unintentional weight loss, skin rash, eye pain, sinus congestion and sinus pain, sore throat, dysphagia,  hemoptysis , cough, dyspnea, wheezing, chest pain, palpitations, orthopnea, edema, abdominal pain, nausea, melena, diarrhea, constipation, flank pain, dysuria, hematuria, urinary  Frequency, nocturia, numbness, tingling, seizures,  Focal weakness, Loss of consciousness,  Tremor, insomnia, depression, anxiety, and suicidal ideation.      Objective:  BP 120/72 (BP Location: Left Arm, Patient Position: Sitting, Cuff Size: Large)   Pulse 83   Temp 98.5 F (36.9 C) (Oral)   Resp 15    Ht 5\' 3"  (1.6 m)   Wt 176 lb 3.2 oz (79.9 kg)   SpO2 94%   BMI 31.21 kg/m   BP Readings from Last 3 Encounters:  07/27/18 120/72  06/09/18 132/64  04/08/18 129/71    Wt Readings from Last 3 Encounters:  07/27/18 176 lb 3.2 oz (79.9 kg)  06/09/18 177 lb 1.9 oz (80.3 kg)  05/12/18 178 lb 3.2 oz (80.8 kg)    General appearance: alert, cooperative and appears stated age Ears: normal TM's and external ear canals both ears Throat: lips, mucosa, and tongue normal; teeth and gums normal Neck: no adenopathy, no carotid bruit, supple, symmetrical, trachea midline and thyroid not enlarged, symmetric, no tenderness/mass/nodules Back: symmetric, no curvature. ROM normal. No CVA tenderness. Lungs: clear to auscultation bilaterally Heart: regular rate and rhythm, S1, S2 normal, no murmur, click, rub or gallop Abdomen: soft, non-tender; bowel sounds normal; no masses,  no organomegaly Pulses: 2+ and symmetric Skin: Skin color, texture, turgor normal. No rashes or lesions Lymph nodes: Cervical, supraclavicular, and axillary nodes normal.  Lab Results  Component Value Date   HGBA1C 6.6 (H) 07/27/2018   HGBA1C 6.5 01/20/2018   HGBA1C 6.3 06/08/2017    Lab Results  Component Value Date   CREATININE 0.62 07/27/2018   CREATININE 0.56 01/20/2018   CREATININE 0.67 06/08/2017    Lab Results  Component Value Date   WBC 8.0 07/27/2018   HGB 14.4 07/27/2018   HCT 42.6 07/27/2018   PLT 265.0 07/27/2018   GLUCOSE 104 (H) 07/27/2018   CHOL 193 07/27/2018   TRIG 185.0 (H) 07/27/2018   HDL 38.50 (L) 07/27/2018   LDLDIRECT 123.0 01/20/2018   LDLCALC 117 (H) 07/27/2018   ALT 24 07/27/2018   AST 16 07/27/2018   NA 139 07/27/2018   K 3.8 07/27/2018   CL 102 07/27/2018   CREATININE 0.62 07/27/2018   BUN 17 07/27/2018   CO2 29 07/27/2018   TSH 2.18 01/20/2018   INR 1.0 09/27/2012   HGBA1C 6.6 (H) 07/27/2018   MICROALBUR <0.7 01/20/2018    Ct Chest Wo Contrast  Result Date:  04/21/2018 CLINICAL DATA:  Follow-up indeterminate left lung nodules. Personal history of right breast carcinoma. EXAM: CT CHEST WITHOUT CONTRAST TECHNIQUE: Multidetector CT imaging of the chest was performed following the standard protocol without IV contrast. COMPARISON:  01/17/2018 FINDINGS: Cardiovascular: No acute findings. Stable mild cardiomegaly. Aortic and coronary artery atherosclerosis. Mediastinum/Nodes: No masses or pathologically enlarged lymph nodes identified on this unenhanced exam. Prior right mastectomy and axillary lymph node dissection noted, without evidence of axillary lymphadenopathy. Lungs/Pleura: A tightly-grouped cluster of sub-cm pulmonary nodules is again seen in the peripheral left upper lobe and is stable since prior study. No new or enlarging pulmonary nodules or masses identified. No evidence of pulmonary consolidation or pleural effusion. Upper Abdomen:  Unremarkable. Musculoskeletal:  No suspicious bone lesions. IMPRESSION: Single tight cluster of sub-cm pulmonary nodules in peripheral left upper lobe remains stable. This is strongly suggestive of a benign postinflammatory process, rather than malignancy. Continued follow-up by chest CT is recommended in 6 months to confirm continued stability. Stable cardiomegaly and coronary artery calcification. Aortic Atherosclerosis (ICD10-I70.0). Electronically Signed   By: Earle Gell M.D.   On: 04/21/2018 08:46    Assessment & Plan:   Problem List Items Addressed This Visit    Abnormal CT of the chest    Follow up CT is due in December for pulmonary nodules seen on CT in May       CAD (coronary artery disease)    Repeat cardiac cath in May 2019:  Ef 50% , apical hypokinesis,  80% RCA successfully stented with DES .  Mid LAD 50%.  Now exercising at Heart Track with improved exercise tolerance and no chest pain.  Continue Plavix        Diabetes mellitus without complication ([place])     well-controlled on carbohydrate restricted  diet.   hemoglobin A1c has been consistently at or  less than 7.0 . Patient is up-to-date on eye exams and foot exam is normal today. Patient is due for urine microalbumin to creatinine ratio at next visit. Patient is intolerant of  statin therapy for CAD risk reduction.  She is taking  ACE/ARB for reduction in proteinuria.    Lab Results  Component Value Date   HGBA1C 6.6 (H) 07/27/2018   Lab Results  Component Value Date   CHOL 193 07/27/2018   HDL 38.50 (L) 07/27/2018   LDLCALC 117 (H) 07/27/2018   LDLDIRECT 123.0 01/20/2018   TRIG 185.0 (H) 07/27/2018   CHOLHDL 5 07/27/2018   Lab Results  Component Value Date   MICROALBUR <0.7 01/20/2018          Relevant Orders   Hemoglobin A1c (Completed)   Comprehensive metabolic panel (Completed)   Foraminal stenosis of cervical region - Primary    Surgery has been postponed until May 2020 due ot PTCA/stent in May 2019,  She is now having right lateral  leg pain that may be referred from lower back and my need a lumbar spine MRI prior to seeing [facility]       Relevant Orders   CBC with Differential/Platelet (Completed)   Hyperlipidemia associated with type 2 diabetes mellitus ([place])   Relevant Orders   Lipid panel (Completed)   Hyperlipidemia with target low density lipoprotein (LDL) cholesterol less than 70 mg/dL    Previously Untreated despite CAD due to statin intolerance on multiple trials.  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  Unilateral left mammogram ordered for annual screening       Sciatica of  right side associated with disorder of lumbar spine    Her right leg pain is interfering with sleep,  She is requesting a rial of MR.    Valium  prescribed.      Relevant Medications   diazepam (VALIUM) 10 MG tablet    Other Visit Diagnoses    Encounter for current long-term use of anticoagulants       Relevant Orders   CBC with Differential/Platelet (Completed)   Need for influenza vaccination       Relevant Orders   Flu vaccine HIGH DOSE PF (Fluzone High dose) (Completed)     A total of 40 minutes was spent with patient more than half of which was spent in counseling patient on the above mentioned issues , reviewing and explaining recent labs and imaging studies done, and coordination of care. I am having Joelly T. Mcdonald start on diazepam and Zoster Vaccine Adjuvanted. I am also having her maintain her aspirin, clopidogrel, potassium chloride SA, ONE TOUCH ULTRA TEST, losartan, niacin, metoprolol, omeprazole, digoxin, freestyle, furosemide, isosorbide mononitrate, GLUCOSAMINE-CHONDROITIN PO, and loratadine.  Meds ordered this encounter  Medications  . diazepam (VALIUM) 10 MG tablet    Sig: Take 1 tablet (10 mg total) by mouth at bedtime as needed for anxiety (muscle spasm).    Dispense:  30 tablet    Refill:  1  . Zoster Vaccine Adjuvanted [facility]) injection    Sig: Inject 0.5 mLs into the muscle once for 1 dose.    Dispense:  1 each    Refill:  1    There are no discontinued medications.  Follow-up: Return in about 6 months (around 01/25/2019).   Crecencio Mc, MD

## 2018-07-27 NOTE — Progress Notes (Signed)
Cardiac Individual Treatment Plan  Patient Details  Name: Stacie Mcdonald MRN: [id number] Date of Birth: 20-Jul-1946 Referring Provider:     Cardiac Rehab from 05/12/2018 in [facility] Cardiac and Pulmonary Rehab  Referring Provider  [facility]      Initial Encounter Date:    Cardiac Rehab from 05/12/2018 in [facility] Cardiac and Pulmonary Rehab  Date  05/12/18      Visit Diagnosis: Status post coronary artery stent placement  Patient's Home Medications on Admission:  Current Outpatient Medications:  .  aspirin 81 MG tablet, Take 81 mg by mouth daily. , Disp: , Rfl:  .  clopidogrel (PLAVIX) 75 MG tablet, TAKE ONE TABLET BY MOUTH DAILY, Disp: 90 tablet, Rfl: 1 .  digoxin (LANOXIN) 0.125 MG tablet, Take 0.125 mg by mouth daily., Disp: , Rfl:  .  furosemide (LASIX) 40 MG tablet, Take 40 mg by mouth daily., Disp: , Rfl:  .  GLUCOSAMINE-CHONDROITIN PO, Take 2 tablets by mouth daily., Disp: , Rfl:  .  isosorbide mononitrate (IMDUR) 60 MG 24 hr tablet, Take 60 mg by mouth daily., Disp: , Rfl:  .  Lancets (FREESTYLE) lancets, uSE TO CHECK SUGARS ONCE DAILY e11.9, Disp: 100 each, Rfl: 3 .  loratadine (CLARITIN) 10 MG tablet, Take 10 mg by mouth daily as needed for allergies., Disp: , Rfl:  .  losartan (COZAAR) 100 MG tablet, TAKE 1 TABLET (100 MG TOTAL) BY MOUTH DAILY., Disp: 90 tablet, Rfl: 1 .  metoprolol (TOPROL-XL) 200 MG 24 hr tablet, TAKE 1 TABLET BY MOUTH DAILY., Disp: 90 tablet, Rfl: 1 .  niacin 500 MG tablet, Take 1 tablet (500 mg total) by mouth daily., Disp: 90 tablet, Rfl: 1 .  omeprazole (PRILOSEC) 20 MG capsule, Take 1 capsule (20 mg total) by mouth daily. (Patient taking differently: Take 20 mg by mouth daily as needed (acid reflux). ), Disp: 90 capsule, Rfl: 1 .  ONE TOUCH ULTRA TEST test strip, CHECK DAILY, Disp: 100 each, Rfl: 2 .  potassium chloride SA (K-DUR,KLOR-CON) 20 MEQ tablet, Take 1 tablet (20 mEq total) by mouth daily., Disp: 30 tablet, Rfl: 3  Past Medical History: Past  Medical History:  Diagnosis Date  . Allergy   . Arthritis   . Asthma   . Breast Cancer 1991   R mastectomy. XRT , chemo  . Breast cancer ([place]) 1991   RT MASTECTOMY  . CAD (coronary artery disease) 2008   50% LAD occlusion 2009,  stent in mid LAD 2008 for 95%   . Chicken pox   . Colon polyps April 2009   by Colonoscopy, Gustavo Lah  . GERD (gastroesophageal reflux disease)   . Heart disease   . Hyperlipidemia   . Hypertension   . 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 Rheumatic fever   . Urinary tract infection     Tobacco Use: Social History   Tobacco Use  Smoking Status Never Smoker  Smokeless Tobacco Never Used    Labs: Recent Review Flowsheet Data    Labs for ITP Cardiac and Pulmonary Rehab Latest Ref Rng & Units 02/15/2015 06/15/2016 12/15/2016 06/08/2017 01/20/2018   Cholestrol 0 - 200 mg/dL 198 178 191 193 -   LDLCALC 0 - 99 mg/dL 125(H) 116(H) 117(H) 124(H) -   LDLDIRECT mg/dL - 135.0 133.0 142.0 123.0   HDL >39.00 mg/dL 39.60 36.40(L) 35.60(L) 36.40(L) -   Trlycerides 0.0 - 149.0 mg/dL 168.0(H) 125.0 192.0(H) 163.0(H) -   Hemoglobin A1c 4.6 -  6.5 % 6.7(H) 6.5 6.5 6.3 6.5       Exercise Target Goals: Exercise Program Goal: Individual exercise prescription set using results from initial 6 min walk test and THRR while considering  patient's activity barriers and safety.   Exercise Prescription Goal: Initial exercise prescription builds to 30-45 minutes a day of aerobic activity, 2-3 days per week.  Home exercise guidelines will be given to patient during program as part of exercise prescription that the participant will acknowledge.  Activity Barriers & Risk Stratification: Activity Barriers & Cardiac Risk Stratification - 05/12/18 1351      Activity Barriers & Cardiac Risk Stratification   Activity Barriers  Arthritis;Shortness of Breath    Cardiac Risk Stratification  High       6 Minute Walk: 6 Minute Walk    Row Name 05/12/18 1343          6 Minute Walk   Distance  1214 feet     Walk Time  6 minutes     # of Rest Breaks  0     MPH  2.3     METS  2.6     RPE  16     Perceived Dyspnea   3     VO2 Peak  9.1     Symptoms  No     Resting HR  95 bpm     Resting BP  142/70     Resting Oxygen Saturation   93 %     Exercise Oxygen Saturation  during 6 min walk  91 %     Max Ex. HR  113 bpm     Max Ex. BP  152/72     2 Minute Post BP  150/72        Oxygen Initial Assessment: Oxygen Initial Assessment - 05/12/18 1350      Home Oxygen   Sleep Oxygen Prescription  None    Home Exercise Oxygen Prescription  None    Home at Rest Exercise Oxygen Prescription  None      Initial 6 min Walk   Oxygen Used  None      Program Oxygen Prescription   Program Oxygen Prescription  None       Oxygen Re-Evaluation:   Oxygen Discharge (Final Oxygen Re-Evaluation):   Initial Exercise Prescription: Initial Exercise Prescription - 05/12/18 1300      Date of Initial Exercise RX and Referring Provider   Date  05/12/18    Referring Provider  [facility]      Treadmill   MPH  2    Grade  0    Minutes  15    METs  2.5      NuStep   Level  2    SPM  80    Minutes  15    METs  2.6      REL-XR   Level  2    Speed  50    Minutes  15    METs  2.5      Prescription Details   Frequency (times per week)  2    Duration  Progress to 30 minutes of continuous aerobic without signs/symptoms of physical distress      Intensity   THRR 40-80% of Max Heartrate  115-136    Ratings of Perceived Exertion  11-13    Perceived Dyspnea  0-4      Resistance Training   Training Prescription  Yes    Weight  2 lb  Reps  10-15       Perform Capillary Blood Glucose checks as needed.  Exercise Prescription Changes: Exercise Prescription Changes    Row Name 05/12/18 1300 05/24/18 1300 06/02/18 0800 06/08/18 0900 06/21/18 1500     Response to Exercise   Blood Pressure (Admit)  142/70  116/68  -  132/68  134/80   Blood  Pressure (Exercise)  152/70  140/72  -  150/68  128/76   Blood Pressure (Exit)  150/72  148/62  -  132/80  128/64   Heart Rate (Admit)  86 bpm  92 bpm  -  89 bpm  75 bpm   Heart Rate (Exercise)  110 bpm  98 bpm  -  123 bpm  94 bpm   Heart Rate (Exit)  113 bpm  79 bpm  -  81 bpm  73 bpm   Oxygen Saturation (Admit)  93 %  -  -  -  -   Oxygen Saturation (Exercise)  92 %  -  -  -  -   Oxygen Saturation (Exit)  93 %  -  -  -  -   Rating of Perceived Exertion (Exercise)  16  13  -  15  14   Symptoms  -  none  -  none  none   Comments  -  first day of exercise  -  -  -   Duration  -  Continue with 30 min of aerobic exercise without signs/symptoms of physical distress.  -  Continue with 30 min of aerobic exercise without signs/symptoms of physical distress.  Continue with 30 min of aerobic exercise without signs/symptoms of physical distress.   Intensity  -  THRR unchanged  -  THRR unchanged  THRR unchanged     Progression   Progression  -  Continue to progress workloads to maintain intensity without signs/symptoms of physical distress.  -  Continue to progress workloads to maintain intensity without signs/symptoms of physical distress.  Continue to progress workloads to maintain intensity without signs/symptoms of physical distress.   Average METs  -  2.75  -  2.84  2.45     Resistance Training   Training Prescription  -  Yes  -  Yes  Yes   Weight  -  3 lb  -  3 lbs  2 lbs   Reps  -  10-15  -  10-15  10-15     Interval Training   Interval Training  -  No  -  No  No     Treadmill   MPH  -  2  -  2  -   Grade  -  0  -  -  -   Minutes  -  15  -  15  -   METs  -  2.5  -  2.5  -     NuStep   Level  -  -  -  2  2   Minutes  -  -  -  15  15   METs  -  -  -  2.5  2     REL-XR   Level  -  2  -  2  4   Speed  -  50  -  -  -   Minutes  -  15  -  15  15   METs  -  3  -  3.5  2.9  Home Exercise Plan   Plans to continue exercise at  -  -  [employer] (comment) walking and [facility]  [employer] (comment) walking and [employer] [employer] (comment) walking and [facility] [employer]  -  -  Add 3 additional days to program exercise sessions.  Add 3 additional days to program exercise sessions.  Add 3 additional days to program exercise sessions.   Initial Home Exercises Provided  -  -  06/02/18  06/02/18  06/02/18   Row Name 07/05/18 1500 07/19/18 1500           Response to Exercise   Blood Pressure (Admit)  130/74  126/62      Blood Pressure (Exercise)  148/76  126/66      Blood Pressure (Exit)  134/64  130/78      Heart Rate (Admit)  85 bpm  86 bpm      Heart Rate (Exercise)  104 bpm  103 bpm      Heart Rate (Exit)  83 bpm  80 bpm      Rating of Perceived Exertion (Exercise)  15  15      Symptoms  none  none      Duration  Continue with 30 min of aerobic exercise without signs/symptoms of physical distress.  Continue with 30 min of aerobic exercise without signs/symptoms of physical distress.      Intensity  THRR unchanged  THRR unchanged        Progression   Progression  Continue to progress workloads to maintain intensity without signs/symptoms of physical distress.  Continue to progress workloads to maintain intensity without signs/symptoms of physical distress.      Average METs  2.84  3.31        Resistance Training   Training Prescription  Yes  Yes      Weight  2 lbs  2 lbs      Reps  10-15  10-15        Interval Training   Interval Training  No  No        Treadmill   MPH  2  2      Grade  0  0      Minutes  15  15      METs  2.53  2.53        NuStep   Level  2  2      Minutes  15  15      METs  2.9  3.6        REL-XR   Level  4  4      Minutes  15  15      METs  3.1  3.8        Home Exercise Plan   Plans to continue exercise at  [employer] (comment) walking and [facility] [employer] (comment) walking and [facility] [employer]      Frequency  Add 3 additional days to program exercise sessions.   Add 3 additional days to program exercise sessions.      Initial Home Exercises Provided  06/02/18  06/02/18         Exercise Comments: Exercise Comments    Row Name 05/24/18 0809           Exercise Comments  First full day of exercise!  Patient was oriented to gym and equipment including functions, settings, policies, and procedures.  Patient's individual exercise prescription and treatment plan were reviewed.  All starting workloads were established based on the results of the 6 minute walk test done at initial orientation visit.  The plan for exercise progression was also introduced and progression will be customized based on patient's performance and goals.          Exercise Goals and Review: Exercise Goals    Row Name 05/12/18 1343             Exercise Goals   Increase Physical Activity  Yes       Intervention  Provide advice, education, support and counseling about physical activity/exercise needs.;Develop an individualized exercise prescription for aerobic and resistive training based on initial evaluation findings, risk stratification, comorbidities and participant's personal goals.       Expected Outcomes  Short Term: Attend rehab on a regular basis to increase amount of physical activity.;Long Term: Add in home exercise to make exercise part of routine and to increase amount of physical activity.;Long Term: Exercising regularly at least 3-5 days a week.       Increase Strength and Stamina  Yes       Intervention  Provide advice, education, support and counseling about physical activity/exercise needs.;Develop an individualized exercise prescription for aerobic and resistive training based on initial evaluation findings, risk stratification, comorbidities and participant's personal goals.       Expected Outcomes  Short Term: Increase workloads from initial exercise prescription for resistance, speed, and METs.;Short Term: Perform resistance training exercises routinely during  rehab and add in resistance training at home;Long Term: Improve cardiorespiratory fitness, muscular endurance and strength as measured by increased METs and functional capacity (6MWT)       Able to understand and use rate of perceived exertion (RPE) scale  Yes       Intervention  Provide education and explanation on how to use RPE scale       Expected Outcomes  Short Term: Able to use RPE daily in rehab to express subjective intensity level;Long Term:  Able to use RPE to guide intensity level when exercising independently       Able to understand and use Dyspnea scale  Yes       Intervention  Provide education and explanation on how to use Dyspnea scale       Expected Outcomes  Short Term: Able to use Dyspnea scale daily in rehab to express subjective sense of shortness of breath during exertion;Long Term: Able to use Dyspnea scale to guide intensity level when exercising independently       Knowledge and understanding of Target Heart Rate Range (THRR)  Yes       Intervention  Provide education and explanation of THRR including how the numbers were predicted and where they are located for reference       Expected Outcomes  Short Term: Able to state/look up THRR;Short Term: Able to use daily as guideline for intensity in rehab;Long Term: Able to use THRR to govern intensity when exercising independently       Able to check pulse independently  Yes       Intervention  Provide education and demonstration on how to check pulse in carotid and radial arteries.;Review the importance of being able to check your own pulse for safety during independent exercise       Expected Outcomes  Short Term: Able to explain why pulse checking is important during independent exercise;Long Term: Able to check pulse independently and accurately  Understanding of Exercise Prescription  Yes       Intervention  Provide education, explanation, and written materials on patient's individual exercise prescription        Expected Outcomes  Short Term: Able to explain program exercise prescription;Long Term: Able to explain home exercise prescription to exercise independently          Exercise Goals Re-Evaluation : Exercise Goals Re-Evaluation    Row Name 05/24/18 0809 06/02/18 0833 06/08/18 0943 06/21/18 0800 07/05/18 1515     Exercise Goal Re-Evaluation   Exercise Goals Review  Increase Physical Activity;Increase Strength and Stamina;Able to understand and use rate of perceived exertion (RPE) scale;Knowledge and understanding of Target Heart Rate Range (THRR)  Increase Physical Activity;Increase Strength and Stamina;Able to understand and use rate of perceived exertion (RPE) scale;Knowledge and understanding of Target Heart Rate Range (THRR);Able to check pulse independently;Understanding of Exercise Prescription  Increase Physical Activity;Understanding of Exercise Prescription;Increase Strength and Stamina  Increase Physical Activity;Understanding of Exercise Prescription;Increase Strength and Stamina  Increase Physical Activity;Understanding of Exercise Prescription;Increase Strength and Stamina   Comments  Reviewed RPE scale, THR and program prescription with pt today.  Pt voiced understanding and was given a copy of goals to take home.   Reviewed home exercise with pt today.  Pt plans to continue walking at home for exercise.  She also uses her Silver Sneakers to go to [facility] [place].  Reviewed THR, pulse, RPE, sign and symptoms, NTG use, and when to call 911 or MD.  Also discussed weather considerations and indoor options.  Pt voiced understanding.  Stacie Mcdonald is off to a good start in rehab.  She has been doing well with all of her workloads and should be ready to start to increase.  We will continue to monitor her progress.   Stacie Mcdonald is doin well in rehab.  She is going to [facility] [place] with her Silver Sneakers three times a week.  She is starting to feel like her strength and stamina are coming back.   Stacie Mcdonald  continues to do well in rehab.  She is now up to level 4 on the XR. We will continue to monitor her progress.    Expected Outcomes  Reviewed RPE scale, THR and program prescription with pt today.  Pt voiced understanding and was given a copy of goals to take home.   Short: Continue to exercise on off days.  Long: Continue to build strength and stamina.   Short: Increase workloads!  Long: Continue to build strength and stamina.   Short: Continue to increase workloads.  Long: Continue to execise on off days.   Short: Continue to work on [employer].  Long: Contineu to build strength and stamina.    Stacie Mcdonald Name 07/19/18 1538             Exercise Goal Re-Evaluation   Exercise Goals Review  Increase Physical Activity;Understanding of Exercise Prescription;Increase Strength and Stamina       Comments  Stacie Mcdonald has been doing well in rehab.  She is still a 14-15 RPE on her workloads which limits her progress. We will continue to try to increase her and monitor her progress.        Expected Outcomes  Short: Increase workload on NuStep.  Long: Continue to increase strength and stamina.           Discharge Exercise Prescription (Final Exercise Prescription Changes): Exercise Prescription Changes - 07/19/18 1500      Response to Exercise  Blood Pressure (Admit)  126/62    Blood Pressure (Exercise)  126/66    Blood Pressure (Exit)  130/78    Heart Rate (Admit)  86 bpm    Heart Rate (Exercise)  103 bpm    Heart Rate (Exit)  80 bpm    Rating of Perceived Exertion (Exercise)  15    Symptoms  none    Duration  Continue with 30 min of aerobic exercise without signs/symptoms of physical distress.    Intensity  THRR unchanged      Progression   Progression  Continue to progress workloads to maintain intensity without signs/symptoms of physical distress.    Average METs  3.31      Resistance Training   Training Prescription  Yes    Weight  2 lbs    Reps  10-15      Interval Training    Interval Training  No      Treadmill   MPH  2    Grade  0    Minutes  15    METs  2.53      NuStep   Level  2    Minutes  15    METs  3.6      REL-XR   Level  4    Minutes  15    METs  3.8      Home Exercise Plan   Plans to continue exercise at  [employer] (comment)   walking and [facility] [place]   Frequency  Add 3 additional days to program exercise sessions.    Initial Home Exercises Provided  06/02/18       Nutrition:  Target Goals: Understanding of nutrition guidelines, daily intake of sodium '1500mg'$ , cholesterol '200mg'$ , calories 30% from fat and 7% or less from saturated fats, daily to have 5 or more servings of fruits and vegetables.  Biometrics: Pre Biometrics - 05/12/18 1342      Pre Biometrics   Height  '5\' 3"'$  (1.6 m)    Weight  178 lb 3.2 oz (80.8 kg)    Waist Circumference  37.5 inches    Hip Circumference  43.25 inches    Waist to Hip Ratio  0.87 %    BMI (Calculated)  31.57    Single Leg Stand  25 seconds        Nutrition Therapy Plan and Nutrition Goals: Nutrition Therapy & Goals - 05/12/18 1355      Nutrition Therapy   RD appointment deferred  Yes    Drug/Food Interactions  Statins/Certain Fruits      Personal Nutrition Goals   Nutrition Goal  To cont to eat heart healthy.      Intervention Plan   Intervention  Prescribe, educate and counsel regarding individualized specific dietary modifications aiming towards targeted core components such as weight, hypertension, lipid management, diabetes, heart failure and other comorbidities.;Nutrition handout(s) given to patient.    Expected Outcomes  Short Term Goal: Understand basic principles of dietary content, such as calories, fat, sodium, cholesterol and nutrients.;Short Term Goal: A plan has been developed with personal nutrition goals set during dietitian appointment.;Long Term Goal: Adherence to prescribed nutrition plan.       Nutrition Assessments: Nutrition Assessments - 05/12/18 1406       MEDFICTS Scores   Pre Score  --   Patient did not want to fill out form      Nutrition Goals Re-Evaluation: Nutrition Goals Re-Evaluation    Stacie Mcdonald Name 06/21/18 [phone] 07/05/18  3710           Goals   Nutrition Goal  Eat heart healthy  -      Comment  Stacie Mcdonald declines a nutrition appointment.  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  She has been doing well with her blood sugars as well. She tries to eat healthy.  Tamasha does not want to meet with RD.      Expected Outcome  Short: Continue to work on diet.  Long: Continue to eat heart healthy.   Stacie Mcdonald will continue to manage her heart health with exercise and diet.         Nutrition Goals Discharge (Final Nutrition Goals Re-Evaluation): Nutrition Goals Re-Evaluation - 07/05/18 0809      Goals   Comment  Stacie Mcdonald does not want to meet with RD.    Expected Outcome  Stacie Mcdonald will continue to manage her heart health with exercise and diet.       Psychosocial: Target Goals: Acknowledge presence or absence of significant depression and/or stress, maximize coping skills, provide positive support system. Participant is able to verbalize types and ability to use techniques and skills needed for reducing stress and depression.   Initial Review & Psychosocial Screening: Initial Psych Review & Screening - 05/12/18 1358      Initial Review   Current issues with  Current Sleep Concerns      Family Dynamics   Good Support System?  Yes      Barriers   Psychosocial barriers to participate in program  The patient should benefit from training in stress management and relaxation.      Screening Interventions   Interventions  Encouraged to exercise;Program counselor consult    Expected Outcomes  Long Term Goal: Stressors or current issues are controlled or eliminated.;Short Term goal: Utilizing psychosocial counselor, staff and physician to assist with identification of specific Stressors or current issues interfering with healing  process. Setting desired goal for each stressor or current issue identified.       Quality of Life Scores:  Quality of Life - 05/12/18 1409      Quality of Life   Select  Quality of Life      Quality of Life Scores   Health/Function Pre  22.87 %    Socioeconomic Pre  24.43 %    Psych/Spiritual Pre  26.79 %    Family Pre  27.6 %    GLOBAL Pre  24.69 %      Scores of 19 and below usually indicate a poorer quality of life in these areas.  A difference of  2-3 points is a clinically meaningful difference.  A difference of 2-3 points in the total score of the Quality of Life Index has been associated with significant improvement in overall quality of life, self-image, physical symptoms, and general health in studies assessing change in quality of life.  PHQ-9: Recent Review Flowsheet Data    Depression screen [facility] 2/9 06/09/2018 05/12/2018 01/06/2018 06/08/2017 06/08/2016   Decreased Interest 0 0 0 0 0   Down, Depressed, Hopeless 0 0 0 0 0   PHQ - 2 Score 0 0 0 0 0   Altered sleeping - 1 - 0 -   Tired, decreased energy - 2 - 0 -   Change in appetite - 0 - 0 -   Feeling bad or failure about yourself  - 0 - 0 -   Trouble concentrating - 0 - 0 -  Moving slowly or fidgety/restless - 0 - 0 -   Suicidal thoughts - 0 - 0 -   PHQ-9 Score - 3 - 0 -   Difficult doing work/chores - Somewhat difficult - - -     Interpretation of Total Score  Total Score Depression Severity:  1-4 = Minimal depression, 5-9 = Mild depression, 10-14 = Moderate depression, 15-19 = Moderately severe depression, 20-27 = Severe depression   Psychosocial Evaluation and Intervention: Psychosocial Evaluation - 06/21/18 1042      Psychosocial Evaluation & Interventions   Interventions  Encouraged to exercise with the program and follow exercise prescription;Stress management education    Comments  Counselor met with Ms. Stacie Mcdonald) today for initial psychosocial evaluation.  She is a 72 year old who had a stent  inserted in May.  Megham has a strong support system with a spouse of 47 years; a son who lives locally and active involvement in her local church.  She is a breast cancer survivor over 20 years and reports the her heart condition and lung condition are a result of the chemo she was on at that time.  She is not sleeping well - reporting maybe 4 hours intermittently.  She has a good appetite and denies a history of depression or anxiety or any current symptoms.  Stacie Mcdonald states other than her own health and her spouse's health she has minimal stress in her life and is typically in a positive mood.  She has goals to increase her energy while in this program.    Expected Outcomes  Short:  Stacie Mcdonald will speak with her Dr. soon about her chronic sleep problem.  She will also exercise regularly to increase her energy.   Long:  Stacie Mcdonald will develop a habit of exercise and healthy eating for her long term health benefits - including managing stress.      Continue Psychosocial Services   Follow up required by staff       Psychosocial Re-Evaluation: Psychosocial Re-Evaluation    [place] Name 07/05/18 [phone]             Psychosocial Re-Evaluation   Current issues with  Current Stress Concerns       Comments  Stacie Mcdonald reports no new concerns.  She doesnt feel she has more than normal stress at this time.       Expected Outcomes  Short - continue to exercise and use stress management techniques Long - manage or eliminate stress           Psychosocial Discharge (Final Psychosocial Re-Evaluation): Psychosocial Re-Evaluation - 07/05/18 0837      Psychosocial Re-Evaluation   Current issues with  Current Stress Concerns    Comments  Stacie Mcdonald reports no new concerns.  She doesnt feel she has more than normal stress at this time.    Expected Outcomes  Short - continue to exercise and use stress management techniques Long - manage or eliminate stress        Vocational Rehabilitation: Provide vocational rehab assistance  to qualifying candidates.   Vocational Rehab Evaluation & Intervention: Vocational Rehab - 05/12/18 1337      Initial Vocational Rehab Evaluation & Intervention   Assessment shows need for Vocational Rehabilitation  No       Education: Education Goals: Education classes will be provided on a variety of topics geared toward better understanding of heart health and risk factor modification. Participant will state understanding/return demonstration of topics presented as noted by education test scores.  Learning Barriers/Preferences: Learning Barriers/Preferences - 05/12/18 1337      Learning Barriers/Preferences   Learning Barriers  None    Learning Preferences  Written Material       Education Topics:  AED/CPR: - Group verbal and written instruction with the use of models to demonstrate the basic use of the AED with the basic ABC's of resuscitation.   Cardiac Rehab from 07/19/2018 in [facility] Cardiac and Pulmonary Rehab  Date  06/02/18  Educator  SB  Instruction Review Code  1- Verbalizes Understanding      General Nutrition Guidelines/Fats and Fiber: -Group instruction provided by verbal, written material, models and posters to present the general guidelines for heart healthy nutrition. Gives an explanation and review of dietary fats and fiber.   Cardiac Rehab from 07/19/2018 in [facility] Cardiac and Pulmonary Rehab  Date  05/31/18  Educator  CR  Instruction Review Code  1- Verbalizes Understanding      Controlling Sodium/Reading Food Labels: -Group verbal and written material supporting the discussion of sodium use in heart healthy nutrition. Review and explanation with models, verbal and written materials for utilization of the food label.   Cardiac Rehab from 07/19/2018 in [facility] Cardiac and Pulmonary Rehab  Date  06/07/18  Educator  SB  Instruction Review Code  5- Refused Teaching      Exercise Physiology & General Exercise Guidelines: - Group verbal and written instruction  with models to review the exercise physiology of the cardiovascular system and associated critical values. Provides general exercise guidelines with specific guidelines to those with heart or lung disease.    Aerobic Exercise & Resistance Training: - Gives group verbal and written instruction on the various components of exercise. Focuses on aerobic and resistive training programs and the benefits of this training and how to safely progress through these programs..   Cardiac Rehab from 07/19/2018 in [facility] Cardiac and Pulmonary Rehab  Date  06/23/18  Educator  AS  Instruction Review Code  5- Refused Teaching      Flexibility, Balance, Mind/Body Relaxation: Provides group verbal/written instruction on the benefits of flexibility and balance training, including mind/body exercise modes such as yoga, pilates and tai chi.  Demonstration and skill practice provided.   Cardiac Rehab from 07/19/2018 in [facility] Cardiac and Pulmonary Rehab  Date  06/28/18  Educator  AS  Instruction Review Code  5- Refused Teaching      Stress and Anxiety: - Provides group verbal and written instruction about the health risks of elevated stress and causes of high stress.  Discuss the correlation between heart/lung disease and anxiety and treatment options. Review healthy ways to manage with stress and anxiety.   Cardiac Rehab from 07/19/2018 in [facility] Cardiac and Pulmonary Rehab  Date  05/24/18  Educator  [facility]  Instruction Review Code  1- Verbalizes Understanding      Depression: - Provides group verbal and written instruction on the correlation between heart/lung disease and depressed mood, treatment options, and the stigmas associated with seeking treatment.   Cardiac Rehab from 07/19/2018 in [facility] Cardiac and Pulmonary Rehab  Date  06/21/18  Educator  [facility]  Instruction Review Code  1- Verbalizes Understanding      Anatomy & Physiology of the Heart: - Group verbal and written instruction and models provide basic cardiac  anatomy and physiology, with the coronary electrical and arterial systems. Review of Valvular disease and Heart Failure   Cardiac Rehab from 07/19/2018 in [facility] Cardiac and Pulmonary Rehab  Date  05/26/18  Educator  SB  Instruction Review Code  5- Refused Teaching      Cardiac Procedures: - Group verbal and written instruction to review commonly prescribed medications for heart disease. Reviews the medication, class of the drug, and side effects. Includes the steps to properly store meds and maintain the prescription regimen. (beta blockers and nitrates)   Cardiac Medications I: - Group verbal and written instruction to review commonly prescribed medications for heart disease. Reviews the medication, class of the drug, and side effects. Includes the steps to properly store meds and maintain the prescription regimen.   Cardiac Rehab from 07/19/2018 in [facility] Cardiac and Pulmonary Rehab  Date  07/12/18  Educator  SB  Instruction Review Code  5- Refused Teaching      Cardiac Medications II: -Group verbal and written instruction to review commonly prescribed medications for heart disease. Reviews the medication, class of the drug, and side effects. (all other drug classes)   Cardiac Rehab from 07/19/2018 in [facility] Cardiac and Pulmonary Rehab  Date  06/30/18  Educator  CE  Instruction Review Code  5- Refused Teaching       Go Sex-Intimacy & Heart Disease, Get SMART - Goal Setting: - Group verbal and written instruction through game format to discuss heart disease and the return to sexual intimacy. Provides group verbal and written material to discuss and apply goal setting through the application of the S.M.A.R.T. Method.   Other Matters of the Heart: - Provides group verbal, written materials and models to describe Stable Angina and Peripheral Artery. Includes description of the disease process and treatment options available to the cardiac patient.   Cardiac Rehab from 07/19/2018 in [facility] Cardiac  and Pulmonary Rehab  Date  05/26/18  Educator  SB  Instruction Review Code  5- Refused Teaching      Exercise & Equipment Safety: - Individual verbal instruction and demonstration of equipment use and safety with use of the equipment.   Cardiac Rehab from 07/19/2018 in [facility] Cardiac and Pulmonary Rehab  Date  05/12/18  Educator  C. Enterkin,RN  Instruction Review Code  1- Verbalizes Understanding      Infection Prevention: - Provides verbal and written material to individual with discussion of infection control including proper hand washing and proper equipment cleaning during exercise session.   Cardiac Rehab from 07/19/2018 in [facility] Cardiac and Pulmonary Rehab  Date  05/12/18  Educator  [facility]  Instruction Review Code  1- Verbalizes Understanding      Falls Prevention: - Provides verbal and written material to individual with discussion of falls prevention and safety.   Cardiac Rehab from 07/19/2018 in [facility] Cardiac and Pulmonary Rehab  Date  05/12/18  Educator  C. Enterkin  Instruction Review Code  1- Verbalizes Understanding      Diabetes: - Individual verbal and written instruction to review signs/symptoms of diabetes, desired ranges of glucose level fasting, after meals and with exercise. Acknowledge that pre and post exercise glucose checks will be done for 3 sessions at entry of program.   Know Your Numbers and Risk Factors: -Group verbal and written instruction about important numbers in your health.  Discussion of what are risk factors and how they play a role in the disease process.  Review of Cholesterol, Blood Pressure, Diabetes, and BMI and the role they play in your overall health.   Cardiac Rehab from 07/19/2018 in [facility] Cardiac and Pulmonary Rehab  Date  06/30/18  Educator  CE  Instruction Review Code  5- Refused  Teaching      Sleep Hygiene: -Provides group verbal and written instruction about how sleep can affect your health.  Define sleep hygiene, discuss sleep  cycles and impact of sleep habits. Review good sleep hygiene tips.    Cardiac Rehab from 07/19/2018 in [facility] Cardiac and Pulmonary Rehab  Date  07/19/18  Educator  [facility]  Instruction Review Code  1- Verbalizes Understanding      Other: -Provides group and verbal instruction on various topics (see comments)   Knowledge Questionnaire Score: Knowledge Questionnaire Score - 05/12/18 1409      Knowledge Questionnaire Score   Pre Score  26/26       Core Components/Risk Factors/Patient Goals at Admission: Personal Goals and Risk Factors at Admission - 05/12/18 1356      Core Components/Risk Factors/Patient Goals on Admission   Improve shortness of breath with ADL's  Yes    Intervention  Provide education, individualized exercise plan and daily activity instruction to help decrease symptoms of SOB with activities of daily living.    Expected Outcomes  Short Term: Improve cardiorespiratory fitness to achieve a reduction of symptoms when performing ADLs;Long Term: Be able to perform more ADLs without symptoms or delay the onset of symptoms    Heart Failure  Yes   History of heart failure in the past   Intervention  Provide a combined exercise and nutrition program that is supplemented with education, support and counseling about heart failure. Directed toward relieving symptoms such as shortness of breath, decreased exercise tolerance, and extremity edema.    Expected Outcomes  Improve functional capacity of life;Short term: Attendance in program 2-3 days a week with increased exercise capacity. Reported lower sodium intake. Reported increased fruit and vegetable intake. Reports medication compliance.;Short term: Daily weights obtained and reported for increase. Utilizing diuretic protocols set by physician.;Long term: Adoption of self-care skills and reduction of barriers for early signs and symptoms recognition and intervention leading to self-care maintenance.    Lipids  Yes    Intervention   Provide education and support for participant on nutrition & aerobic/resistive exercise along with prescribed medications to achieve LDL '70mg'$ , HDL >'40mg'$ .    Expected Outcomes  Short Term: Participant states understanding of desired cholesterol values and is compliant with medications prescribed. Participant is following exercise prescription and nutrition guidelines.;Long Term: Cholesterol controlled with medications as prescribed, with individualized exercise RX and with personalized nutrition plan. Value goals: LDL < '70mg'$ , HDL > 40 mg.       Core Components/Risk Factors/Patient Goals Review:  Goals and Risk Factor Review    Row Name 06/21/18 0802 07/05/18 0801           Core Components/Risk Factors/Patient Goals Review   Personal Goals Review  Weight Management/Obesity;Heart Failure;Hypertension  Heart Failure;Diabetes;Hypertension;Weight Management/Obesity      Review  Stacie Mcdonald's weight has been holding steady.  She has not been having any heart failure symptoms at home.  She is staying on top of it with taking her dieuretic daily.   Her blood pressures have been getting better recently.  She checks them on occasion at home.    Stacie Mcdonald checks her weight daily and monitors for symtpoms of HF.  She does check her BP and BG occasionally at home.  BP has been lower the past few weeks.  It is staying in the 120-130 range.  She hasn't been able to tell she has increased energy but wants to reach that goal with exercise.  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      Expected Outcomes  Short: Continue to work on weight loss.  Long: Continue to manage her heart failure.    Short - continue to exercise and eat heart healthy Long - manage health with exercise and healthy diet         Core Components/Risk Factors/Patient Goals at Discharge (Final Review):  Goals and Risk Factor Review - 07/05/18 0801      Core Components/Risk Factors/Patient Goals Review   Personal Goals Review  Heart  Failure;Diabetes;Hypertension;Weight Management/Obesity    Review  Stacie Mcdonald checks her weight daily and monitors for symtpoms of HF.  She does check her BP and BG occasionally at home.  BP has been lower the past few weeks.  It is staying in the 120-130 range.  She hasn't been able to tell she has increased energy but wants to reach that goal with exercise.  She does [employer] classes on days not at [facility].    Expected Outcomes  Short - continue to exercise and eat heart healthy Long - manage health with exercise and healthy diet       ITP Comments: ITP Comments    Row Name 05/12/18 1346 05/12/18 1403 06/01/18 0626 06/29/18 0810 07/27/18 0620   ITP Comments  Stacie Mcdonald is concerned about her oxgyen level since she is short of breath still walking at times. We will have her on a cont. pulse ox even on Room Air. Stacie Mcdonald noticed that her pulse ox briefly on her 6 minute walk was 89% on Room air.  Stacie Mcdonald has had 10/10 severe neck pain before her Cat Scan of her neck.  Stacie Mcdonald reports that she has chronic neck pain of 3/10 all the time. She has tried massage and other things but still  has the pain. Stacie Mcdonald doesn't want to take stron pain medicines. Stacie Mcdonald recently had skin grafts done since she fell asleep with a heating pad. 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 It didn't hurt her to walk for her 6 minute walk in the hallway.   Med Review completed. Initial ITP created. Diagnosis can be found in [facility] 5/23  30 day review. Continue with ITP unless directed changes per Medical Director review.  New to program  30 day review completed. ITP sent to Dr. Ramonita Lab, covering for Dr. Emily Filbert, Medical Director of Cardiac Rehab. Continue with ITP unless changes are made by physician  30 day review completed. ITP sent to Dr. Emily Filbert, Medical Director of Cardiac Rehab. Continue with ITP unless changes are made by physician      Comments:

## 2018-07-28 ENCOUNTER — Encounter: Payer: Medicare Other | Admitting: *Deleted

## 2018-07-28 DIAGNOSIS — Z48812 Encounter for surgical aftercare following surgery on the circulatory system: Secondary | ICD-10-CM | POA: Diagnosis not present

## 2018-07-28 DIAGNOSIS — Z955 Presence of coronary angioplasty implant and graft: Secondary | ICD-10-CM | POA: Diagnosis not present

## 2018-07-28 NOTE — Progress Notes (Signed)
Daily Session Note  Patient Details  Name: Stacie Mcdonald MRN: [id number] Date of Birth: 02/10/1946 Referring Provider:     Cardiac Rehab from 05/12/2018 in [facility] Cardiac and Pulmonary Rehab  Referring Provider  [facility]      Encounter Date: 07/28/2018  Check In: Session Check In - 07/28/18 0805      Check-In   Supervising physician immediately available to respond to emergencies  See telemetry face sheet for immediately available ER MD    Location  [facility]-Cardiac & Pulmonary Rehab    Staff Present  Alberteen Sam, MA, RCEP, CCRP, Exercise Physiologist;Krista Frederico Hamman, RN BSN;Carroll Enterkin, RN, BSN    Medication changes reported      No    Fall or balance concerns reported     No    Tobacco Cessation  No Change    Warm-up and Cool-down  Performed on first and last piece of equipment    Resistance Training Performed  Yes    VAD Patient?  No    PAD/SET Patient?  No      Pain Assessment   Currently in Pain?  No/denies    Multiple Pain Sites  No          Social History   Tobacco Use  Smoking Status Never Smoker  Smokeless Tobacco Never Used    Goals Met:  Independence with exercise equipment Exercise tolerated well No report of cardiac concerns or symptoms Strength training completed today  Goals Unmet:  Not Applicable  Comments: Pt able to follow exercise prescription today without complaint.  Will continue to monitor for progression.    Dr. Emily Filbert is Medical Director for [place] and [facility].

## 2018-07-29 DIAGNOSIS — Z1239 Encounter for other screening for malignant neoplasm of breast: Secondary | ICD-10-CM

## 2018-07-30 DIAGNOSIS — M5386 Other specified dorsopathies, lumbar region: Secondary | ICD-10-CM | POA: Insufficient documentation

## 2018-07-30 NOTE — Assessment & Plan Note (Signed)
Follow up CT is due in December for pulmonary nodules seen on CT in May

## 2018-07-30 NOTE — Assessment & Plan Note (Signed)
S/p right mastectomy 1991 ,  Chemo .  Unilateral left mammogram ordered for annual screening

## 2018-07-30 NOTE — Assessment & Plan Note (Addendum)
Surgery has been postponed until May 2020 due ot PTCA/stent in May 2019,  She is now having right lateral  leg pain that may be referred from lower back and my need a lumbar spine MRI prior to seeing Nudelmann

## 2018-07-30 NOTE — Assessment & Plan Note (Signed)
well-controlled on carbohydrate restricted diet.   hemoglobin A1c has been consistently at or  less than 7.0 . Patient is up-to-date on eye exams and foot exam is normal today. Patient is due for urine microalbumin to creatinine ratio at next visit. Patient is intolerant of  statin therapy for CAD risk reduction.  She is taking  ACE/ARB for reduction in proteinuria.    Lab Results  Component Value Date   HGBA1C 6.6 (H) 07/27/2018   Lab Results  Component Value Date   CHOL 193 07/27/2018   HDL 38.50 (L) 07/27/2018   LDLCALC 117 (H) 07/27/2018   LDLDIRECT 123.0 01/20/2018   TRIG 185.0 (H) 07/27/2018   CHOLHDL 5 07/27/2018   Lab Results  Component Value Date   MICROALBUR <0.7 01/20/2018

## 2018-07-30 NOTE — Assessment & Plan Note (Signed)
Repeat cardiac cath in May 2019:  Ef 50% , apical hypokinesis,  80% RCA successfully stented with DES .  Mid LAD 50%.  Now exercising at [facility] with improved exercise tolerance and no chest pain.  Continue Plavix

## 2018-07-30 NOTE — Assessment & Plan Note (Addendum)
Will repeat DEXA this year and petition for Prolia

## 2018-07-30 NOTE — Assessment & Plan Note (Signed)
Her right leg pain is interfering with sleep,  She is requesting a rial of MR.    Valium prescribed.

## 2018-07-30 NOTE — Assessment & Plan Note (Addendum)
Previously Untreated despite CAD due to statin intolerance on multiple trials.  Repatha has been requested starting in May  Lab Results  Component Value Date   CHOL 193 07/27/2018   HDL 38.50 (L) 07/27/2018   LDLCALC 117 (H) 07/27/2018   LDLDIRECT 123.0 01/20/2018   TRIG 185.0 (H) 07/27/2018   CHOLHDL 5 07/27/2018

## 2018-08-02 DIAGNOSIS — Z48812 Encounter for surgical aftercare following surgery on the circulatory system: Secondary | ICD-10-CM | POA: Diagnosis not present

## 2018-08-02 DIAGNOSIS — Z955 Presence of coronary angioplasty implant and graft: Secondary | ICD-10-CM

## 2018-08-02 NOTE — Progress Notes (Signed)
Daily Session Note  Patient Details  Name: Stacie Mcdonald MRN: [id number] Date of Birth: 1946-06-13 Referring Provider:     Cardiac Rehab from 05/12/2018 in [facility] Cardiac and Pulmonary Rehab  Referring Provider  [facility]      Encounter Date: 08/02/2018  Check In:      Social History   Tobacco Use  Smoking Status Never Smoker  Smokeless Tobacco Never Used    Goals Met:  Independence with exercise equipment Exercise tolerated well No report of cardiac concerns or symptoms Strength training completed today  Goals Unmet:  Not Applicable  Comments: Pt able to follow exercise prescription today without complaint.  Will continue to monitor for progression.    Dr. Emily Filbert is Medical Director for [place] and [facility].

## 2018-08-04 ENCOUNTER — Encounter: Payer: Medicare Other | Admitting: *Deleted

## 2018-08-04 DIAGNOSIS — Z955 Presence of coronary angioplasty implant and graft: Secondary | ICD-10-CM

## 2018-08-04 DIAGNOSIS — Z48812 Encounter for surgical aftercare following surgery on the circulatory system: Secondary | ICD-10-CM | POA: Diagnosis not present

## 2018-08-04 NOTE — Progress Notes (Signed)
Daily Session Note  Patient Details  Name: Stacie Mcdonald MRN: [id number] Date of Birth: 11-10-1946 Referring Provider:     Cardiac Rehab from 05/12/2018 in [facility] Cardiac and Pulmonary Rehab  Referring Provider  [facility]      Encounter Date: 08/04/2018  Check In: Session Check In - 08/04/18 0758      Check-In   Supervising physician immediately available to respond to emergencies  See telemetry face sheet for immediately available ER MD    Location  [facility]-Cardiac & Pulmonary Rehab    Staff Present  Alberteen Sam, MA, RCEP, CCRP, Exercise Physiologist;Amanda Oletta Darter, BA, ACSM CEP, Exercise Physiologist;Carroll Enterkin, RN, BSN    Medication changes reported      No    Fall or balance concerns reported     No    Warm-up and Cool-down  Performed on first and last piece of equipment    Resistance Training Performed  Yes    VAD Patient?  No    PAD/SET Patient?  No      Pain Assessment   Currently in Pain?  No/denies          Social History   Tobacco Use  Smoking Status Never Smoker  Smokeless Tobacco Never Used    Goals Met:  Independence with exercise equipment Exercise tolerated well No report of cardiac concerns or symptoms Strength training completed today  Goals Unmet:  Not Applicable  Comments: Pt able to follow exercise prescription today without complaint.  Will continue to monitor for progression.  [place] Name 05/12/18 1343 08/04/18 0823       6 Minute Walk   Phase  -  Discharge    Distance  1214 feet  1370 feet    Distance % Change  -  156 %    Distance Feet Change  -  12.8 ft    Walk Time  6 minutes  6 minutes    # of Rest Breaks  0  0    MPH  2.3  2.6    METS  2.6  2.95    RPE  16  16    Perceived Dyspnea   3  3    VO2 Peak  9.1  10.33    Symptoms  No  No    Resting HR  95 bpm  88 bpm    Resting BP  142/70  110/62    Resting Oxygen Saturation   93 %  -    Exercise Oxygen Saturation  during 6 min walk  91 %  97 %    Max Ex.  HR  113 bpm  116 bpm    Max Ex. BP  152/72  158/84    2 Minute Post BP  150/72  -        Dr. Emily Filbert is Medical Director for [place] and [facility].

## 2018-08-09 ENCOUNTER — Encounter: Payer: Medicare Other | Admitting: *Deleted

## 2018-08-09 DIAGNOSIS — Z955 Presence of coronary angioplasty implant and graft: Secondary | ICD-10-CM

## 2018-08-09 DIAGNOSIS — Z48812 Encounter for surgical aftercare following surgery on the circulatory system: Secondary | ICD-10-CM | POA: Diagnosis not present

## 2018-08-09 NOTE — Progress Notes (Signed)
Daily Session Note  Patient Details  Name: Stacie Mcdonald MRN: [id number] Date of Birth: 1946-05-01 Referring Provider:     Cardiac Rehab from 05/12/2018 in [facility] Cardiac and Pulmonary Rehab  Referring Provider  [facility]      Encounter Date: 08/09/2018  Check In: Session Check In - 08/09/18 0753      Check-In   Supervising physician immediately available to respond to emergencies  See telemetry face sheet for immediately available ER MD    Location  [facility]-Cardiac & Pulmonary Rehab    Staff Present  Carson Myrtle, BS, RRT, Respiratory Lennie Hummer, MA, RCEP, CCRP, Exercise Physiologist;Amanda Oletta Darter, BA, ACSM CEP, Exercise Physiologist;Susanne Bice, RN, BSN, CCRP    Medication changes reported      No    Fall or balance concerns reported     No    Warm-up and Cool-down  Performed on first and last piece of equipment    Resistance Training Performed  Yes    VAD Patient?  No    PAD/SET Patient?  No      Pain Assessment   Currently in Pain?  No/denies          Social History   Tobacco Use  Smoking Status Never Smoker  Smokeless Tobacco Never Used    Goals Met:  Independence with exercise equipment Exercise tolerated well No report of cardiac concerns or symptoms Strength training completed today  Goals Unmet:  Not Applicable  Comments: Pt able to follow exercise prescription today without complaint.  Will continue to monitor for progression.    Dr. Emily Filbert is Medical Director for [place] and [facility].

## 2018-08-11 ENCOUNTER — Encounter: Payer: Medicare Other | Admitting: *Deleted

## 2018-08-11 DIAGNOSIS — Z48812 Encounter for surgical aftercare following surgery on the circulatory system: Secondary | ICD-10-CM | POA: Diagnosis not present

## 2018-08-11 DIAGNOSIS — Z955 Presence of coronary angioplasty implant and graft: Secondary | ICD-10-CM | POA: Diagnosis not present

## 2018-08-11 NOTE — Progress Notes (Signed)
Daily Session Note  Patient Details  Name: Claire T Martinique MRN: [id number] Date of Birth: Jan 30, 1946 Referring Provider:     Cardiac Rehab from 05/12/2018 in [facility]  Referring Provider  [facility]      Encounter Date: 08/11/2018  Check In: Session Check In - 08/11/18 0844      Check-In   Supervising physician immediately available to respond to emergencies  See telemetry face sheet for immediately available ER MD    Location  [facility]-Cardiac & Pulmonary Rehab    Staff Present  Vida Rigger RN, BSN;Carroll Enterkin, RN, BSN;Jessica Luan Pulling, MA, RCEP, CCRP, Exercise Physiologist;Amanda Oletta Darter, BA, ACSM CEP, Exercise Physiologist    Medication changes reported      No    Fall or balance concerns reported     No    Warm-up and Cool-down  Performed on first and last piece of equipment    Resistance Training Performed  Yes    VAD Patient?  No    PAD/SET Patient?  No      Pain Assessment   Currently in Pain?  No/denies          Social History   Tobacco Use  Smoking Status Never Smoker  Smokeless Tobacco Never Used    Goals Met:  Independence with exercise equipment Exercise tolerated well No report of cardiac concerns or symptoms Strength training completed today  Goals Unmet:  Not Applicable  Comments: Pt able to follow exercise prescription today without complaint.  Will continue to monitor for progression.    Dr. Emily Filbert is Medical Director for [place] and [facility].

## 2018-08-16 ENCOUNTER — Encounter: Payer: Medicare Other | Attending: Internal Medicine | Admitting: *Deleted

## 2018-08-16 DIAGNOSIS — Z955 Presence of coronary angioplasty implant and graft: Secondary | ICD-10-CM

## 2018-08-16 DIAGNOSIS — Z48812 Encounter for surgical aftercare following surgery on the circulatory system: Secondary | ICD-10-CM | POA: Diagnosis not present

## 2018-08-16 NOTE — Patient Instructions (Signed)
Discharge Patient Instructions  Patient Details  Name: Stacie Mcdonald MRN: [id number] Date of Birth: [birth year] Referring Provider:  Yolonda Kida, MD   Number of Visits: 51  Reason for Discharge:  Patient reached a stable level of exercise. Patient independent in their exercise. Patient has met program and personal goals.  Smoking History:  Social History   Tobacco Use  Smoking Status Never Smoker  Smokeless Tobacco Never Used    Diagnosis:  Status post coronary artery stent placement  Initial Exercise Prescription: Initial Exercise Prescription - 05/12/18 1300      Date of Initial Exercise RX and Referring Provider   Date  05/12/18    Referring Provider  [place]      Treadmill   MPH  2    Grade  0    Minutes  15    METs  2.5      NuStep   Level  2    SPM  80    Minutes  15    METs  2.6      REL-XR   Level  2    Speed  50    Minutes  15    METs  2.5      Prescription Details   Frequency (times per week)  2    Duration  Progress to 30 minutes of continuous aerobic without signs/symptoms of physical distress      Intensity   THRR 40-80% of Max Heartrate  115-136    Ratings of Perceived Exertion  11-13    Perceived Dyspnea  0-4      Resistance Training   Training Prescription  Yes    Weight  2 lb    Reps  10-15       Discharge Exercise Prescription (Final Exercise Prescription Changes): Exercise Prescription Changes - 08/15/18 1500      Response to Exercise   Blood Pressure (Admit)  146/62    Blood Pressure (Exercise)  134/68    Blood Pressure (Exit)  122/60    Heart Rate (Admit)  79 bpm    Heart Rate (Exercise)  99 bpm    Heart Rate (Exit)  78 bpm    Rating of Perceived Exertion (Exercise)  15    Symptoms  none    Duration  Continue with 30 min of aerobic exercise without signs/symptoms of physical distress.    Intensity  THRR unchanged      Progression   Progression  Continue to progress workloads to maintain intensity without  signs/symptoms of physical distress.    Average METs  2.88      Resistance Training   Training Prescription  Yes    Weight  2 lbs    Reps  10-15      Interval Training   Interval Training  No      Treadmill   MPH  2    Grade  0    Minutes  15    METs  2.53      NuStep   Level  2    Minutes  15    METs  2.4      REL-XR   Level  4    Minutes  15    METs  3.6      Home Exercise Plan   Plans to continue exercise at  [employer] (comment)   walking and B[employer] by [employer]   Frequency  Add 3 additional days to program exercise sessions.    Initial Home  Exercises Provided  06/02/18       Functional Capacity: 6 Minute Walk    Row Name 05/12/18 1343 08/04/18 0823       6 Minute Walk   Phase  -  Discharge    Distance  1214 feet  1370 feet    Distance % Change  -  156 %    Distance Feet Change  -  12.8 ft    Walk Time  6 minutes  6 minutes    # of Rest Breaks  0  0    MPH  2.3  2.6    METS  2.6  2.95    RPE  16  16    Perceived Dyspnea   3  3    VO2 Peak  9.1  10.33    Symptoms  No  No    Resting HR  95 bpm  88 bpm    Resting BP  142/70  110/62    Resting Oxygen Saturation   93 %  -    Exercise Oxygen Saturation  during 6 min walk  91 %  97 %    Max Ex. HR  113 bpm  116 bpm    Max Ex. BP  152/72  158/84    2 Minute Post BP  150/72  -       Quality of Life: Quality of Life - 05/12/18 1409      Quality of Life   Select  Quality of Life      Quality of Life Scores   Health/Function Pre  22.87 %    Socioeconomic Pre  24.43 %    Psych/Spiritual Pre  26.79 %    Family Pre  27.6 %    GLOBAL Pre  24.69 %       Personal Goals: Goals established at orientation with interventions provided to work toward goal. Personal Goals and Risk Factors at Admission - 05/12/18 1356      Core Components/Risk Factors/Patient Goals on Admission   Improve shortness of breath with ADL's  Yes    Intervention  Provide education, individualized exercise plan and daily  activity instruction to help decrease symptoms of SOB with activities of daily living.    Expected Outcomes  Short Term: Improve cardiorespiratory fitness to achieve a reduction of symptoms when performing ADLs;Long Term: Be able to perform more ADLs without symptoms or delay the onset of symptoms    Heart Failure  Yes   History of heart failure in the past   Intervention  Provide a combined exercise and nutrition program that is supplemented with education, support and counseling about heart failure. Directed toward relieving symptoms such as shortness of breath, decreased exercise tolerance, and extremity edema.    Expected Outcomes  Improve functional capacity of life;Short term: Attendance in program 2-3 days a week with increased exercise capacity. Reported lower sodium intake. Reported increased fruit and vegetable intake. Reports medication compliance.;Short term: Daily weights obtained and reported for increase. Utilizing diuretic protocols set by physician.;Long term: Adoption of self-care skills and reduction of barriers for early signs and symptoms recognition and intervention leading to self-care maintenance.    Lipids  Yes    Intervention  Provide education and support for participant on nutrition & aerobic/resistive exercise along with prescribed medications to achieve LDL '70mg'$ , HDL >'40mg'$ .    Expected Outcomes  Short Term: Participant states understanding of desired cholesterol values and is compliant with medications prescribed. Participant is following exercise prescription and nutrition  guidelines.;Long Term: Cholesterol controlled with medications as prescribed, with individualized exercise RX and with personalized nutrition plan. Value goals: LDL < '70mg'$ , HDL > 40 mg.        Personal Goals Discharge: Goals and Risk Factor Review - 08/04/18 0811      Core Components/Risk Factors/Patient Goals Review   Personal Goals Review  Weight Management/Obesity;Heart  Failure;Lipids;Hypertension;Diabetes    Review  Stacie Mcdonald is monitoring her weight daily.  She takes all meds as directed and checks BG as directed by her Dr. Her a1c was 6.5 last time it was measured.  She does still have SOB when walking (not on TM or other equipment).  She will discuss this with her Dr. to see if further evaluation is needed.  She will continue SS exercise when she completes HT.  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    Expected Outcomes  Short - discuss SOB with her Dr Laverta Baltimore - maintain exercise on her own       Exercise Goals and Review: Exercise Goals    Row Name 05/12/18 1343             Exercise Goals   Increase Physical Activity  Yes       Intervention  Provide advice, education, support and counseling about physical activity/exercise needs.;Develop an individualized exercise prescription for aerobic and resistive training based on initial evaluation findings, risk stratification, comorbidities and participant's personal goals.       Expected Outcomes  Short Term: Attend rehab on a regular basis to increase amount of physical activity.;Long Term: Add in home exercise to make exercise part of routine and to increase amount of physical activity.;Long Term: Exercising regularly at least 3-5 days a week.       Increase Strength and Stamina  Yes       Intervention  Provide advice, education, support and counseling about physical activity/exercise needs.;Develop an individualized exercise prescription for aerobic and resistive training based on initial evaluation findings, risk stratification, comorbidities and participant's personal goals.       Expected Outcomes  Short Term: Increase workloads from initial exercise prescription for resistance, speed, and METs.;Short Term: Perform resistance training exercises routinely during rehab and add in resistance training at home;Long Term: Improve cardiorespiratory fitness, muscular  endurance and strength as measured by increased METs and functional capacity (6MWT)       Able to understand and use rate of perceived exertion (RPE) scale  Yes       Intervention  Provide education and explanation on how to use RPE scale       Expected Outcomes  Short Term: Able to use RPE daily in rehab to express subjective intensity level;Long Term:  Able to use RPE to guide intensity level when exercising independently       Able to understand and use Dyspnea scale  Yes       Intervention  Provide education and explanation on how to use Dyspnea scale       Expected Outcomes  Short Term: Able to use Dyspnea scale daily in rehab to express subjective sense of shortness of breath during exertion;Long Term: Able to use Dyspnea scale to guide intensity level when exercising independently       Knowledge and understanding of Target Heart Rate Range (THRR)  Yes       Intervention  Provide education and explanation of THRR including how the numbers were predicted and where they are located  for reference       Expected Outcomes  Short Term: Able to state/look up THRR;Short Term: Able to use daily as guideline for intensity in rehab;Long Term: Able to use THRR to govern intensity when exercising independently       Able to check pulse independently  Yes       Intervention  Provide education and demonstration on how to check pulse in carotid and radial arteries.;Review the importance of being able to check your own pulse for safety during independent exercise       Expected Outcomes  Short Term: Able to explain why pulse checking is important during independent exercise;Long Term: Able to check pulse independently and accurately       Understanding of Exercise Prescription  Yes       Intervention  Provide education, explanation, and written materials on patient's individual exercise prescription       Expected Outcomes  Short Term: Able to explain program exercise prescription;Long Term: Able to explain  home exercise prescription to exercise independently          Nutrition & Weight - Outcomes: Pre Biometrics - 05/12/18 1342      Pre Biometrics   Height  '5\' 3"'$  (1.6 m)    Weight  178 lb 3.2 oz (80.8 kg)    Waist Circumference  37.5 inches    Hip Circumference  43.25 inches    Waist to Hip Ratio  0.87 %    BMI (Calculated)  31.57    Single Leg Stand  25 seconds        Nutrition: Nutrition Therapy & Goals - 05/12/18 1355      Nutrition Therapy   RD appointment deferred  Yes    Drug/Food Interactions  Statins/Certain Fruits      Personal Nutrition Goals   Nutrition Goal  To cont to eat heart healthy.      Intervention Plan   Intervention  Prescribe, educate and counsel regarding individualized specific dietary modifications aiming towards targeted core components such as weight, hypertension, lipid management, diabetes, heart failure and other comorbidities.;Nutrition handout(s) given to patient.    Expected Outcomes  Short Term Goal: Understand basic principles of dietary content, such as calories, fat, sodium, cholesterol and nutrients.;Short Term Goal: A plan has been developed with personal nutrition goals set during dietitian appointment.;Long Term Goal: Adherence to prescribed nutrition plan.       Nutrition Discharge: Nutrition Assessments - 05/12/18 1406      MEDFICTS Scores   Pre Score  --   Patient did not want to fill out form      Education Questionnaire Score: Knowledge Questionnaire Score - 05/12/18 1409      Knowledge Questionnaire Score   Pre Score  26/26       Goals reviewed with patient; copy given to patient.

## 2018-08-16 NOTE — Progress Notes (Signed)
Daily Session Note  Patient Details  Name: Stacie Mcdonald MRN: [id number] Date of Birth: May 31, 1946 Referring Provider:     Cardiac Rehab from 05/12/2018 in [facility] Re[facility]  Referring Provider  [facility]      Encounter Date: 08/16/2018  Check In: Session Check In - 08/16/18 7322      Check-In   Supervising physician immediately available to respond to emergencies  See telemetry face sheet for immediately available ER MD    Location  [facility]-Cardiac & Pulmonary Rehab    Staff Present  Nyoka Cowden, RN, BSN, [place], BS, RRT, Respiratory Lennie Hummer, MA, RCEP, CCRP, Exercise Physiologist;Amanda Oletta Darter, [place], ACSM CEP, Exercise Physiologist    Medication changes reported      No    Fall or balance concerns reported     No    Warm-up and Cool-down  Performed on first and last piece of equipment    Resistance Training Performed  Yes    VAD Patient?  No    PAD/SET Patient?  No      Pain Assessment   Currently in Pain?  No/denies          Social History   Tobacco Use  Smoking Status Never Smoker  Smokeless Tobacco Never Used    Goals Met:  Independence with exercise equipment Exercise tolerated well No report of cardiac concerns or symptoms Strength training completed today  Goals Unmet:  Not Applicable  Comments: Pt able to follow exercise prescription today without complaint.  Will continue to monitor for progression.    Dr. Emily Filbert is Medical Director for [place] and [facility].

## 2018-08-18 ENCOUNTER — Encounter: Payer: Medicare Other | Admitting: *Deleted

## 2018-08-18 DIAGNOSIS — Z955 Presence of coronary angioplasty implant and graft: Secondary | ICD-10-CM

## 2018-08-18 DIAGNOSIS — Z48812 Encounter for surgical aftercare following surgery on the circulatory system: Secondary | ICD-10-CM | POA: Diagnosis not present

## 2018-08-18 NOTE — Progress Notes (Signed)
Cardiac Individual Treatment Plan  Patient Details  Name: Stacie Mcdonald MRN: [id number] Date of Birth: 21-Nov-1945 Referring Provider:     Cardiac Rehab from 05/12/2018 in [facility] Cardiac and Pulmonary Rehab  Referring Provider  [facility]      Initial Encounter Date:    Cardiac Rehab from 05/12/2018 in [facility] Cardiac and Pulmonary Rehab  Date  05/12/18      Visit Diagnosis: Status post coronary artery stent placement  Patient's Home Medications on Admission:  Current Outpatient Medications:  .  aspirin 81 MG tablet, Take 81 mg by mouth daily. , Disp: , Rfl:  .  clopidogrel (PLAVIX) 75 MG tablet, TAKE ONE TABLET BY MOUTH DAILY, Disp: 90 tablet, Rfl: 1 .  diazepam (VALIUM) 10 MG tablet, Take 1 tablet (10 mg total) by mouth at bedtime as needed for anxiety (muscle spasm)., Disp: 30 tablet, Rfl: 1 .  digoxin (LANOXIN) 0.125 MG tablet, Take 0.125 mg by mouth daily., Disp: , Rfl:  .  furosemide (LASIX) 40 MG tablet, Take 40 mg by mouth daily., Disp: , Rfl:  .  GLUCOSAMINE-CHONDROITIN PO, Take 2 tablets by mouth daily., Disp: , Rfl:  .  isosorbide mononitrate (IMDUR) 60 MG 24 hr tablet, Take 60 mg by mouth daily., Disp: , Rfl:  .  Lancets (FREESTYLE) lancets, uSE TO CHECK SUGARS ONCE DAILY e11.9, Disp: 100 each, Rfl: 3 .  loratadine (CLARITIN) 10 MG tablet, Take 10 mg by mouth daily as needed for allergies., Disp: , Rfl:  .  losartan (COZAAR) 100 MG tablet, TAKE 1 TABLET (100 MG TOTAL) BY MOUTH DAILY., Disp: 90 tablet, Rfl: 1 .  metoprolol (TOPROL-XL) 200 MG 24 hr tablet, TAKE 1 TABLET BY MOUTH DAILY., Disp: 90 tablet, Rfl: 1 .  niacin 500 MG tablet, Take 1 tablet (500 mg total) by mouth daily., Disp: 90 tablet, Rfl: 1 .  omeprazole (PRILOSEC) 20 MG capsule, Take 1 capsule (20 mg total) by mouth daily. (Patient taking differently: Take 20 mg by mouth daily as needed (acid reflux). ), Disp: 90 capsule, Rfl: 1 .  ONE TOUCH ULTRA TEST test strip, CHECK DAILY, Disp: 100 each, Rfl: 2 .  potassium  chloride SA (K-DUR,KLOR-CON) 20 MEQ tablet, Take 1 tablet (20 mEq total) by mouth daily., Disp: 30 tablet, Rfl: 3  Past Medical History: Past Medical History:  Diagnosis Date  . Allergy   . Arthritis   . Asthma   . Breast Cancer 1991   R mastectomy. XRT , chemo  . Breast cancer ([place]) 1991   RT MASTECTOMY  . CAD (coronary artery disease) 2008   50% LAD occlusion 2009,  stent in mid LAD 2008 for 95%   . Chicken pox   . Colon polyps April 2009   by Colonoscopy, Gustavo Lah  . GERD (gastroesophageal reflux disease)   . Heart disease   . Hyperlipidemia   . Hypertension   . 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 Rheumatic fever   . Urinary tract infection     Tobacco Use: Social History   Tobacco Use  Smoking Status Never Smoker  Smokeless Tobacco Never Used    Labs: Recent Review Flowsheet Data    Labs for ITP Cardiac and Pulmonary Rehab Latest Ref Rng & Units 06/15/2016 12/15/2016 06/08/2017 01/20/2018 07/27/2018   Cholestrol 0 - 200 mg/dL 178 191 193 - 193   LDLCALC 0 - 99 mg/dL 116(H) 117(H) 124(H) - 117(H)   LDLDIRECT mg/dL 135.0 133.0 142.0 123.0 -  HDL >39.00 mg/dL 36.40(L) 35.60(L) 36.40(L) - 38.50(L)   Trlycerides 0.0 - 149.0 mg/dL 125.0 192.0(H) 163.0(H) - 185.0(H)   Hemoglobin A1c 4.6 - 6.5 % 6.5 6.5 6.3 6.5 6.6(H)       Exercise Target Goals: Exercise Program Goal: Individual exercise prescription set using results from initial 6 min walk test and THRR while considering  patient's activity barriers and safety.   Exercise Prescription Goal: Initial exercise prescription builds to 30-45 minutes a day of aerobic activity, 2-3 days per week.  Home exercise guidelines will be given to patient during program as part of exercise prescription that the participant will acknowledge.  Activity Barriers & Risk Stratification: Activity Barriers & Cardiac Risk Stratification - 05/12/18 1351      Activity Barriers & Cardiac Risk Stratification   Activity  Barriers  Arthritis;Shortness of Breath    Cardiac Risk Stratification  High       6 Minute Walk: 6 Minute Walk    Row Name 05/12/18 1343 08/04/18 0823       6 Minute Walk   Phase  -  Discharge    Distance  1214 feet  1370 feet    Distance % Change  -  156 %    Distance Feet Change  -  12.8 ft    Walk Time  6 minutes  6 minutes    # of Rest Breaks  0  0    MPH  2.3  2.6    METS  2.6  2.95    RPE  16  16    Perceived Dyspnea   3  3    VO2 Peak  9.1  10.33    Symptoms  No  No    Resting HR  95 bpm  88 bpm    Resting BP  142/70  110/62    Resting Oxygen Saturation   93 %  -    Exercise Oxygen Saturation  during 6 min walk  91 %  97 %    Max Ex. HR  113 bpm  116 bpm    Max Ex. BP  152/72  158/84    2 Minute Post BP  150/72  -       Oxygen Initial Assessment: Oxygen Initial Assessment - 05/12/18 1350      Home Oxygen   Sleep Oxygen Prescription  None    Home Exercise Oxygen Prescription  None    Home at Rest Exercise Oxygen Prescription  None      Initial 6 min Walk   Oxygen Used  None      Program Oxygen Prescription   Program Oxygen Prescription  None       Oxygen Re-Evaluation:   Oxygen Discharge (Final Oxygen Re-Evaluation):   Initial Exercise Prescription: Initial Exercise Prescription - 05/12/18 1300      Date of Initial Exercise RX and Referring Provider   Date  05/12/18    Referring Provider  [facility]      Treadmill   MPH  2    Grade  0    Minutes  15    METs  2.5      NuStep   Level  2    SPM  80    Minutes  15    METs  2.6      REL-XR   Level  2    Speed  50    Minutes  15    METs  2.5      Prescription Details  Frequency (times per week)  2    Duration  Progress to 30 minutes of continuous aerobic without signs/symptoms of physical distress      Intensity   THRR 40-80% of Max Heartrate  115-136    Ratings of Perceived Exertion  11-13    Perceived Dyspnea  0-4      Resistance Training   Training Prescription  Yes     Weight  2 lb    Reps  10-15       Perform Capillary Blood Glucose checks as needed.  Exercise Prescription Changes: Exercise Prescription Changes    Row Name 05/12/18 1300 05/24/18 1300 06/02/18 0800 06/08/18 0900 06/21/18 1500     Response to Exercise   Blood Pressure (Admit)  142/70  116/68  -  132/68  134/80   Blood Pressure (Exercise)  152/70  140/72  -  150/68  128/76   Blood Pressure (Exit)  150/72  148/62  -  132/80  128/64   Heart Rate (Admit)  86 bpm  92 bpm  -  89 bpm  75 bpm   Heart Rate (Exercise)  110 bpm  98 bpm  -  123 bpm  94 bpm   Heart Rate (Exit)  113 bpm  79 bpm  -  81 bpm  73 bpm   Oxygen Saturation (Admit)  93 %  -  -  -  -   Oxygen Saturation (Exercise)  92 %  -  -  -  -   Oxygen Saturation (Exit)  93 %  -  -  -  -   Rating of Perceived Exertion (Exercise)  16  13  -  15  14   Symptoms  -  none  -  none  none   Comments  -  first day of exercise  -  -  -   Duration  -  Continue with 30 min of aerobic exercise without signs/symptoms of physical distress.  -  Continue with 30 min of aerobic exercise without signs/symptoms of physical distress.  Continue with 30 min of aerobic exercise without signs/symptoms of physical distress.   Intensity  -  THRR unchanged  -  THRR unchanged  THRR unchanged     Progression   Progression  -  Continue to progress workloads to maintain intensity without signs/symptoms of physical distress.  -  Continue to progress workloads to maintain intensity without signs/symptoms of physical distress.  Continue to progress workloads to maintain intensity without signs/symptoms of physical distress.   Average METs  -  2.75  -  2.84  2.45     Resistance Training   Training Prescription  -  Yes  -  Yes  Yes   Weight  -  3 lb  -  3 lbs  2 lbs   Reps  -  10-15  -  10-15  10-15     Interval Training   Interval Training  -  No  -  No  No     Treadmill   MPH  -  2  -  2  -   Grade  -  0  -  -  -   Minutes  -  15  -  15  -   METs  -  2.5  -   2.5  -     NuStep   Level  -  -  -  2  2   Minutes  -  -  -  15  15   METs  -  -  -  2.5  2     REL-XR   Level  -  2  -  2  4   Speed  -  50  -  -  -   Minutes  -  15  -  15  15   METs  -  3  -  3.5  2.9     Home Exercise Plan   Plans to continue exercise at  -  -  [employer] (comment) walking and [facility] [employer] (comment) walking and [facility] [employer] (comment) walking and [facility] [employer]  -  -  Add 3 additional days to program exercise sessions.  Add 3 additional days to program exercise sessions.  Add 3 additional days to program exercise sessions.   Initial Home Exercises Provided  -  -  06/02/18  06/02/18  06/02/18   Row Name 07/05/18 1500 07/19/18 1500 08/01/18 1600 08/15/18 1500       Response to Exercise   Blood Pressure (Admit)  130/74  126/62  128/68  146/62    Blood Pressure (Exercise)  148/76  126/66  124/70  134/68    Blood Pressure (Exit)  134/64  130/78  128/66  122/60    Heart Rate (Admit)  85 bpm  86 bpm  84 bpm  79 bpm    Heart Rate (Exercise)  104 bpm  103 bpm  97 bpm  99 bpm    Heart Rate (Exit)  83 bpm  80 bpm  78 bpm  78 bpm    Rating of Perceived Exertion (Exercise)  '15  15  15  15    '$ Symptoms  none  none  none  none    Duration  Continue with 30 min of aerobic exercise without signs/symptoms of physical distress.  Continue with 30 min of aerobic exercise without signs/symptoms of physical distress.  Continue with 30 min of aerobic exercise without signs/symptoms of physical distress.  Continue with 30 min of aerobic exercise without signs/symptoms of physical distress.    Intensity  THRR unchanged  THRR unchanged  THRR unchanged  THRR unchanged      Progression   Progression  Continue to progress workloads to maintain intensity without signs/symptoms of physical distress.  Continue to progress workloads to maintain intensity without signs/symptoms of physical distress.  Continue to progress workloads to  maintain intensity without signs/symptoms of physical distress.  Continue to progress workloads to maintain intensity without signs/symptoms of physical distress.    Average METs  2.84  3.31  2.74  2.88      Resistance Training   Training Prescription  Yes  Yes  Yes  Yes    Weight  2 lbs  2 lbs  2 lbs  2 lbs    Reps  10-15  10-15  10-15  10-15      Interval Training   Interval Training  No  No  No  No      Treadmill   MPH  '2  2  2  2    '$ Grade  0  0  0  0    Minutes  '15  15  1  15    '$ METs  2.53  2.53  2.53  2.53      NuStep   Level  '2  2  2  2    '$ Minutes  $'15  15  15  15    'n$ METs  2.9  3.6  2.8  2.4      REL-XR   Level  '4  4  4  4    '$ Minutes  '15  15  15  15    '$ METs  3.1  3.8  2.9  3.6      Home Exercise Plan   Plans to continue exercise at  [employer] (comment) walking and [facility] [employer] (comment) walking and [facility] [employer] (comment) walking and [facility] [employer] (comment) walking and [facility] [employer]  Add 3 additional days to program exercise sessions.  Add 3 additional days to program exercise sessions.  Add 3 additional days to program exercise sessions.  Add 3 additional days to program exercise sessions.    Initial Home Exercises Provided  06/02/18  06/02/18  06/02/18  06/02/18       Exercise Comments: Exercise Comments    Row Name 05/24/18 0809 08/18/18 0806         Exercise Comments  First full day of exercise!  Patient was oriented to gym and equipment including functions, settings, policies, and procedures.  Patient's individual exercise prescription and treatment plan were reviewed.  All starting workloads were established based on the results of the 6 minute walk test done at initial orientation visit.  The plan for exercise progression was also introduced and progression will be customized based on patient's performance and goals.  Stacie Mcdonald graduated today from  rehab with 32 sessions completed.   Details of the patient's exercise prescription and what She needs to do in order to continue the prescription and progress were discussed with patient.  Patient was given a copy of prescription and goals.  Patient verbalized understanding.  Stacie Mcdonald plans to continue to exercise by going to [facility] [place].         Exercise Goals and Review: Exercise Goals    Row Name 05/12/18 1343             Exercise Goals   Increase Physical Activity  Yes       Intervention  Provide advice, education, support and counseling about physical activity/exercise needs.;Develop an individualized exercise prescription for aerobic and resistive training based on initial evaluation findings, risk stratification, comorbidities and participant's personal goals.       Expected Outcomes  Short Term: Attend rehab on a regular basis to increase amount of physical activity.;Long Term: Add in home exercise to make exercise part of routine and to increase amount of physical activity.;Long Term: Exercising regularly at least 3-5 days a week.       Increase Strength and Stamina  Yes       Intervention  Provide advice, education, support and counseling about physical activity/exercise needs.;Develop an individualized exercise prescription for aerobic and resistive training based on initial evaluation findings, risk stratification, comorbidities and participant's personal goals.       Expected Outcomes  Short Term: Increase workloads from initial exercise prescription for resistance, speed, and METs.;Short Term: Perform resistance training exercises routinely during rehab and add in resistance training at home;Long Term: Improve cardiorespiratory fitness, muscular endurance and strength as measured by increased METs and functional capacity (6MWT)       Able to understand and use rate of perceived exertion (RPE) scale  Yes       Intervention  Provide education and explanation on how to  use RPE scale       Expected Outcomes  Short  Term: Able to use RPE daily in rehab to express subjective intensity level;Long Term:  Able to use RPE to guide intensity level when exercising independently       Able to understand and use Dyspnea scale  Yes       Intervention  Provide education and explanation on how to use Dyspnea scale       Expected Outcomes  Short Term: Able to use Dyspnea scale daily in rehab to express subjective sense of shortness of breath during exertion;Long Term: Able to use Dyspnea scale to guide intensity level when exercising independently       Knowledge and understanding of Target Heart Rate Range (THRR)  Yes       Intervention  Provide education and explanation of THRR including how the numbers were predicted and where they are located for reference       Expected Outcomes  Short Term: Able to state/look up THRR;Short Term: Able to use daily as guideline for intensity in rehab;Long Term: Able to use THRR to govern intensity when exercising independently       Able to check pulse independently  Yes       Intervention  Provide education and demonstration on how to check pulse in carotid and radial arteries.;Review the importance of being able to check your own pulse for safety during independent exercise       Expected Outcomes  Short Term: Able to explain why pulse checking is important during independent exercise;Long Term: Able to check pulse independently and accurately       Understanding of Exercise Prescription  Yes       Intervention  Provide education, explanation, and written materials on patient's individual exercise prescription       Expected Outcomes  Short Term: Able to explain program exercise prescription;Long Term: Able to explain home exercise prescription to exercise independently          Exercise Goals Re-Evaluation : Exercise Goals Re-Evaluation    Row Name 05/24/18 0809 06/02/18 0833 06/08/18 0943 06/21/18 0800 07/05/18 1515     Exercise Goal Re-Evaluation   Exercise Goals Review   Increase Physical Activity;Increase Strength and Stamina;Able to understand and use rate of perceived exertion (RPE) scale;Knowledge and understanding of Target Heart Rate Range (THRR)  Increase Physical Activity;Increase Strength and Stamina;Able to understand and use rate of perceived exertion (RPE) scale;Knowledge and understanding of Target Heart Rate Range (THRR);Able to check pulse independently;Understanding of Exercise Prescription  Increase Physical Activity;Understanding of Exercise Prescription;Increase Strength and Stamina  Increase Physical Activity;Understanding of Exercise Prescription;Increase Strength and Stamina  Increase Physical Activity;Understanding of Exercise Prescription;Increase Strength and Stamina   Comments  Reviewed RPE scale, THR and program prescription with pt today.  Pt voiced understanding and was given a copy of goals to take home.   Reviewed home exercise with pt today.  Pt plans to continue walking at home for exercise.  She also uses her Silver Sneakers to go to [facility] [place].  Reviewed THR, pulse, RPE, sign and symptoms, NTG use, and when to call 911 or MD.  Also discussed weather considerations and indoor options.  Pt voiced understanding.  Stacie Mcdonald is off to a good start in rehab.  She has been doing well with all of her workloads and should be ready to start to increase.  We will continue to monitor her progress.   Stacie Mcdonald is doin well in rehab.  She is going to [facility] [place] with her Silver Sneakers three times a week.  She is starting to feel like her strength and stamina are coming back.   Stacie Mcdonald continues to do well in rehab.  She is now up to level 4 on the XR. We will continue to monitor her progress.    Expected Outcomes  Reviewed RPE scale, THR and program prescription with pt today.  Pt voiced understanding and was given a copy of goals to take home.   Short: Continue to exercise on off days.  Long: Continue to build strength and stamina.   Short: Increase  workloads!  Long: Continue to build strength and stamina.   Short: Continue to increase workloads.  Long: Continue to execise on off days.   Short: Continue to work on [place].  Long: Contineu to build strength and stamina.    [place] Name 07/19/18 1538 08/01/18 1637 08/15/18 1530         Exercise Goal Re-Evaluation   Exercise Goals Review  Increase Physical Activity;Understanding of Exercise Prescription;Increase Strength and Stamina  Increase Physical Activity;Understanding of Exercise Prescription;Increase Strength and Stamina  Increase Physical Activity;Understanding of Exercise Prescription;Increase Strength and Stamina     Comments  Stacie Mcdonald has been doing well in rehab.  She is still a 14-15 RPE on her workloads which limits her progress. We will continue to try to increase her and monitor her progress.   Stacie Mcdonald continues to do well in rehab.  She is up to 2.9 METs on the XR.  She is going to let us know if she would like to finish early or to get in all of her sessions with exercise.  We will continue to monitor her progress.   Stacie Mcdonald has continued to do well in rehab. She is starting to near graduation and improved her walk test by over 12%!  We will continue to monitor her progression.  She is planning to continue at [place] [facility] after graduation.      Expected Outcomes  Short: Increase workload on NuStep.  Long: Continue to increase strength and stamina.   Short: Continue to increase workloads. Long: Continue to build strength and stamina.   Short: Graduate!!  Long: Continue to exercise indpendently.         Discharge Exercise Prescription (Final Exercise Prescription Changes): Exercise Prescription Changes - 08/15/18 1500      Response to Exercise   Blood Pressure (Admit)  146/62    Blood Pressure (Exercise)  134/68    Blood Pressure (Exit)  122/60    Heart Rate (Admit)  79 bpm    Heart Rate (Exercise)  99 bpm    Heart Rate (Exit)  78 bpm    Rating of Perceived Exertion  (Exercise)  15    Symptoms  none    Duration  Continue with 30 min of aerobic exercise without signs/symptoms of physical distress.    Intensity  THRR unchanged      Progression   Progression  Continue to progress workloads to maintain intensity without signs/symptoms of physical distress.    Average METs  2.88      Resistance Training   Training Prescription  Yes    Weight  2 lbs    Reps  10-15      Interval Training   Interval Training  No      Treadmill   MPH  2    Grade  0    Minutes  15  METs  2.53      NuStep   Level  2    Minutes  15    METs  2.4      REL-XR   Level  4    Minutes  15    METs  3.6      Home Exercise Plan   Plans to continue exercise at  [facility] (comment)   walking and [facility] [place]   Frequency  Add 3 additional days to program exercise sessions.    Initial Home Exercises Provided  06/02/18       Nutrition:  Target Goals: Understanding of nutrition guidelines, daily intake of sodium '1500mg'$ , cholesterol '200mg'$ , calories 30% from fat and 7% or less from saturated fats, daily to have 5 or more servings of fruits and vegetables.  Biometrics: Pre Biometrics - 05/12/18 1342      Pre Biometrics   Height  '5\' 3"'$  (1.6 m)    Weight  178 lb 3.2 oz (80.8 kg)    Waist Circumference  37.5 inches    Hip Circumference  43.25 inches    Waist to Hip Ratio  0.87 %    BMI (Calculated)  31.57    Single Leg Stand  25 seconds        Nutrition Therapy Plan and Nutrition Goals: Nutrition Therapy & Goals - 05/12/18 1355      Nutrition Therapy   RD appointment deferred  Yes    Drug/Food Interactions  Statins/Certain Fruits      Personal Nutrition Goals   Nutrition Goal  To cont to eat heart healthy.      Intervention Plan   Intervention  Prescribe, educate and counsel regarding individualized specific dietary modifications aiming towards targeted core components such as weight, hypertension, lipid management, diabetes, heart failure and  other comorbidities.;Nutrition handout(s) given to patient.    Expected Outcomes  Short Term Goal: Understand basic principles of dietary content, such as calories, fat, sodium, cholesterol and nutrients.;Short Term Goal: A plan has been developed with personal nutrition goals set during dietitian appointment.;Long Term Goal: Adherence to prescribed nutrition plan.       Nutrition Assessments: Nutrition Assessments - 05/12/18 1406      MEDFICTS Scores   Pre Score  --   Patient did not want to fill out form      Nutrition Goals Re-Evaluation: Nutrition Goals Re-Evaluation    [place] Name 06/21/18 0804 07/05/18 0809           Goals   Nutrition Goal  Eat heart healthy  -      Comment  Stacie Mcdonald declines a nutrition appointment.  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  She has been doing well with her blood sugars as well. She tries to eat healthy.  Stacie Mcdonald does not want to meet with RD.      Expected Outcome  Short: Continue to work on diet.  Long: Continue to eat heart healthy.   Stacie Mcdonald will continue to manage her heart health with exercise and diet.         Nutrition Goals Discharge (Final Nutrition Goals Re-Evaluation): Nutrition Goals Re-Evaluation - 07/05/18 0809      Goals   Comment  Stacie Mcdonald does not want to meet with RD.    Expected Outcome  Stacie Mcdonald will continue to manage her heart health with exercise and diet.       Psychosocial: Target Goals: Acknowledge presence or absence of significant depression and/or stress,  maximize coping skills, provide positive support system. Participant is able to verbalize types and ability to use techniques and skills needed for reducing stress and depression.   Initial Review & Psychosocial Screening: Initial Psych Review & Screening - 05/12/18 1358      Initial Review   Current issues with  Current Sleep Concerns      Family Dynamics   Good Support System?  Yes      Barriers   Psychosocial barriers to participate in program   The patient should benefit from training in stress management and relaxation.      Screening Interventions   Interventions  Encouraged to exercise;Program counselor consult    Expected Outcomes  Long Term Goal: Stressors or current issues are controlled or eliminated.;Short Term goal: Utilizing psychosocial counselor, staff and physician to assist with identification of specific Stressors or current issues interfering with healing process. Setting desired goal for each stressor or current issue identified.       Quality of Life Scores:  Quality of Life - 05/12/18 1409      Quality of Life   Select  Quality of Life      Quality of Life Scores   Health/Function Pre  22.87 %    Socioeconomic Pre  24.43 %    Psych/Spiritual Pre  26.79 %    Family Pre  27.6 %    GLOBAL Pre  24.69 %      Scores of 19 and below usually indicate a poorer quality of life in these areas.  A difference of  2-3 points is a clinically meaningful difference.  A difference of 2-3 points in the total score of the Quality of Life Index has been associated with significant improvement in overall quality of life, self-image, physical symptoms, and general health in studies assessing change in quality of life.  PHQ-9: Recent Review Flowsheet Data    Depression screen [facility] 2/9 06/09/2018 05/12/2018 01/06/2018 06/08/2017 06/08/2016   Decreased Interest 0 0 0 0 0   Down, Depressed, Hopeless 0 0 0 0 0   PHQ - 2 Score 0 0 0 0 0   Altered sleeping - 1 - 0 -   Tired, decreased energy - 2 - 0 -   Change in appetite - 0 - 0 -   Feeling bad or failure about yourself  - 0 - 0 -   Trouble concentrating - 0 - 0 -   Moving slowly or fidgety/restless - 0 - 0 -   Suicidal thoughts - 0 - 0 -   PHQ-9 Score - 3 - 0 -   Difficult doing work/chores - Somewhat difficult - - -     Interpretation of Total Score  Total Score Depression Severity:  1-4 = Minimal depression, 5-9 = Mild depression, 10-14 = Moderate depression, 15-19 =  Moderately severe depression, 20-27 = Severe depression   Psychosocial Evaluation and Intervention: Psychosocial Evaluation - 06/21/18 1042      Psychosocial Evaluation & Interventions   Interventions  Encouraged to exercise with the program and follow exercise prescription;Stress management education    Comments  Counselor met with [name] Coggin) today for initial psychosocial evaluation.  She is a 72 year old who had a stent inserted in May.  Lenola has a strong support system with a spouse of 21 years; a son who lives locally and active involvement in her local church.  She is a breast cancer survivor over 20 years and reports the her heart condition and  lung condition are a result of the chemo she was on at that time.  She is not sleeping well - reporting maybe 4 hours intermittently.  She has a good appetite and denies a history of depression or anxiety or any current symptoms.  Jajaira states other than her own health and her spouse's health she has minimal stress in her life and is typically in a positive mood.  She has goals to increase her energy while in this program.    Expected Outcomes  Short:  Sativa will speak with her Dr. soon about her chronic sleep problem.  She will also exercise regularly to increase her energy.   Long:  Xzandria will develop a habit of exercise and healthy eating for her long term health benefits - including managing stress.      Continue Psychosocial Services   Follow up required by staff       Psychosocial Re-Evaluation: Psychosocial Re-Evaluation    [place] Name 07/05/18 [phone]             Psychosocial Re-Evaluation   Current issues with  Current Stress Concerns       Comments  Mckenzy reports no new concerns.  She doesnt feel she has more than normal stress at this time.       Expected Outcomes  Short - continue to exercise and use stress management techniques Long - manage or eliminate stress           Psychosocial Discharge (Final Psychosocial  Re-Evaluation): Psychosocial Re-Evaluation - 07/05/18 0837      Psychosocial Re-Evaluation   Current issues with  Current Stress Concerns    Comments  Stacie Mcdonald reports no new concerns.  She doesnt feel she has more than normal stress at this time.    Expected Outcomes  Short - continue to exercise and use stress management techniques Long - manage or eliminate stress        Vocational Rehabilitation: Provide vocational rehab assistance to qualifying candidates.   Vocational Rehab Evaluation & Intervention: Vocational Rehab - 05/12/18 1337      Initial Vocational Rehab Evaluation & Intervention   Assessment shows need for Vocational Rehabilitation  No       Education: Education Goals: Education classes will be provided on a variety of topics geared toward better understanding of heart health and risk factor modification. Participant will state understanding/return demonstration of topics presented as noted by education test scores.  Learning Barriers/Preferences: Learning Barriers/Preferences - 05/12/18 1337      Learning Barriers/Preferences   Learning Barriers  None    Learning Preferences  Written Material       Education Topics:  AED/CPR: - Group verbal and written instruction with the use of models to demonstrate the basic use of the AED with the basic ABC's of resuscitation.   Cardiac Rehab from 08/18/2018 in [facility] Cardiac and Pulmonary Rehab  Date  06/02/18  Educator  SB  Instruction Review Code  1- Verbalizes Understanding      General Nutrition Guidelines/Fats and Fiber: -Group instruction provided by verbal, written material, models and posters to present the general guidelines for heart healthy nutrition. Gives an explanation and review of dietary fats and fiber.   Cardiac Rehab from 08/18/2018 in [facility] Cardiac and Pulmonary Rehab  Date  05/31/18  Educator  CR  Instruction Review Code  1- Verbalizes Understanding      Controlling Sodium/Reading Food  Labels: -Group verbal and written material supporting the discussion of sodium use in heart healthy  nutrition. Review and explanation with models, verbal and written materials for utilization of the food label.   Cardiac Rehab from 08/18/2018 in [facility] Cardiac and Pulmonary Rehab  Date  08/04/18  Educator  LB  Instruction Review Code  1- Verbalizes Understanding      Exercise Physiology & General Exercise Guidelines: - Group verbal and written instruction with models to review the exercise physiology of the cardiovascular system and associated critical values. Provides general exercise guidelines with specific guidelines to those with heart or lung disease.    Cardiac Rehab from 08/18/2018 in [facility] Cardiac and Pulmonary Rehab  Date  08/11/18  Educator  [facility]  Instruction Review Code  1- Verbalizes Understanding      Aerobic Exercise & Resistance Training: - Gives group verbal and written instruction on the various components of exercise. Focuses on aerobic and resistive training programs and the benefits of this training and how to safely progress through these programs..   Cardiac Rehab from 08/18/2018 in [facility] Cardiac and Pulmonary Rehab  Date  08/18/18  Educator  [facility]  Instruction Review Code  5- Refused Teaching      Flexibility, Balance, Mind/Body Relaxation: Provides group verbal/written instruction on the benefits of flexibility and balance training, including mind/body exercise modes such as yoga, pilates and tai chi.  Demonstration and skill practice provided.   Cardiac Rehab from 08/18/2018 in [facility] Cardiac and Pulmonary Rehab  Date  06/28/18  Educator  AS  Instruction Review Code  5- Refused Teaching      Stress and Anxiety: - Provides group verbal and written instruction about the health risks of elevated stress and causes of high stress.  Discuss the correlation between heart/lung disease and anxiety and treatment options. Review healthy ways to manage with stress and anxiety.    Cardiac Rehab from 08/18/2018 in [facility] Cardiac and Pulmonary Rehab  Date  05/24/18  Educator  [facility]  Instruction Review Code  1- Verbalizes Understanding      Depression: - Provides group verbal and written instruction on the correlation between heart/lung disease and depressed mood, treatment options, and the stigmas associated with seeking treatment.   Cardiac Rehab from 08/18/2018 in [facility] Cardiac and Pulmonary Rehab  Date  08/16/18  Educator  [facility]  Instruction Review Code  1- Verbalizes Understanding      Anatomy & Physiology of the Heart: - Group verbal and written instruction and models provide basic cardiac anatomy and physiology, with the coronary electrical and arterial systems. Review of Valvular disease and Heart Failure   Cardiac Rehab from 08/18/2018 in [facility] Cardiac and Pulmonary Rehab  Date  05/26/18  Educator  SB  Instruction Review Code  5- Refused Teaching      Cardiac Procedures: - Group verbal and written instruction to review commonly prescribed medications for heart disease. Reviews the medication, class of the drug, and side effects. Includes the steps to properly store meds and maintain the prescription regimen. (beta blockers and nitrates)   Cardiac Medications I: - Group verbal and written instruction to review commonly prescribed medications for heart disease. Reviews the medication, class of the drug, and side effects. Includes the steps to properly store meds and maintain the prescription regimen.   Cardiac Rehab from 08/18/2018 in [facility] Cardiac and Pulmonary Rehab  Date  07/12/18  Educator  SB  Instruction Review Code  5- Refused Teaching      Cardiac Medications II: -Group verbal and written instruction to review commonly prescribed medications for heart disease. Reviews the medication,  class of the drug, and side effects. (all other drug classes)   Cardiac Rehab from 08/18/2018 in [facility] Cardiac and Pulmonary Rehab  Date  06/30/18  Educator  CE  Instruction  Review Code  5- Refused Teaching       Go Sex-Intimacy & Heart Disease, Get SMART - Goal Setting: - Group verbal and written instruction through game format to discuss heart disease and the return to sexual intimacy. Provides group verbal and written material to discuss and apply goal setting through the application of the S.M.A.R.T. Method.   Other Matters of the Heart: - Provides group verbal, written materials and models to describe Stable Angina and Peripheral Artery. Includes description of the disease process and treatment options available to the cardiac patient.   Cardiac Rehab from 08/18/2018 in [facility] Cardiac and Pulmonary Rehab  Date  05/26/18  Educator  SB  Instruction Review Code  5- Refused Teaching      Exercise & Equipment Safety: - Individual verbal instruction and demonstration of equipment use and safety with use of the equipment.   Cardiac Rehab from 08/18/2018 in [facility] Cardiac and Pulmonary Rehab  Date  05/12/18  Educator  C. Enterkin,RN  Instruction Review Code  1- Verbalizes Understanding      Infection Prevention: - Provides verbal and written material to individual with discussion of infection control including proper hand washing and proper equipment cleaning during exercise session.   Cardiac Rehab from 08/18/2018 in [facility] Cardiac and Pulmonary Rehab  Date  05/12/18  Educator  [facility]  Instruction Review Code  1- Verbalizes Understanding      Falls Prevention: - Provides verbal and written material to individual with discussion of falls prevention and safety.   Cardiac Rehab from 08/18/2018 in [facility] Cardiac and Pulmonary Rehab  Date  05/12/18  Educator  C. Enterkin  Instruction Review Code  1- Verbalizes Understanding      Diabetes: - Individual verbal and written instruction to review signs/symptoms of diabetes, desired ranges of glucose level fasting, after meals and with exercise. Acknowledge that pre and post exercise glucose checks will be done for 3  sessions at entry of program.   Know Your Numbers and Risk Factors: -Group verbal and written instruction about important numbers in your health.  Discussion of what are risk factors and how they play a role in the disease process.  Review of Cholesterol, Blood Pressure, Diabetes, and BMI and the role they play in your overall health.   Cardiac Rehab from 08/18/2018 in [facility] Cardiac and Pulmonary Rehab  Date  06/30/18  Educator  CE  Instruction Review Code  5- Refused Teaching      Sleep Hygiene: -Provides group verbal and written instruction about how sleep can affect your health.  Define sleep hygiene, discuss sleep cycles and impact of sleep habits. Review good sleep hygiene tips.    Cardiac Rehab from 08/18/2018 in [facility] Cardiac and Pulmonary Rehab  Date  07/19/18  Educator  [facility]  Instruction Review Code  1- Verbalizes Understanding      Other: -Provides group and verbal instruction on various topics (see comments)   Knowledge Questionnaire Score: Knowledge Questionnaire Score - 05/12/18 1409      Knowledge Questionnaire Score   Pre Score  26/26       Core Components/Risk Factors/Patient Goals at Admission: Personal Goals and Risk Factors at Admission - 05/12/18 1356      Core Components/Risk Factors/Patient Goals on Admission   Improve shortness of breath with ADL's  Yes    Intervention  Provide education, individualized exercise plan and daily activity instruction to help decrease symptoms of SOB with activities of daily living.    Expected Outcomes  Short Term: Improve cardiorespiratory fitness to achieve a reduction of symptoms when performing ADLs;Long Term: Be able to perform more ADLs without symptoms or delay the onset of symptoms    Heart Failure  Yes   History of heart failure in the past   Intervention  Provide a combined exercise and nutrition program that is supplemented with education, support and counseling about heart failure. Directed toward relieving symptoms  such as shortness of breath, decreased exercise tolerance, and extremity edema.    Expected Outcomes  Improve functional capacity of life;Short term: Attendance in program 2-3 days a week with increased exercise capacity. Reported lower sodium intake. Reported increased fruit and vegetable intake. Reports medication compliance.;Short term: Daily weights obtained and reported for increase. Utilizing diuretic protocols set by physician.;Long term: Adoption of self-care skills and reduction of barriers for early signs and symptoms recognition and intervention leading to self-care maintenance.    Lipids  Yes    Intervention  Provide education and support for participant on nutrition & aerobic/resistive exercise along with prescribed medications to achieve LDL '70mg'$ , HDL >'40mg'$ .    Expected Outcomes  Short Term: Participant states understanding of desired cholesterol values and is compliant with medications prescribed. Participant is following exercise prescription and nutrition guidelines.;Long Term: Cholesterol controlled with medications as prescribed, with individualized exercise RX and with personalized nutrition plan. Value goals: LDL < '70mg'$ , HDL > 40 mg.       Core Components/Risk Factors/Patient Goals Review:  Goals and Risk Factor Review    Row Name 06/21/18 0802 07/05/18 0801 08/04/18 0811         Core Components/Risk Factors/Patient Goals Review   Personal Goals Review  Weight Management/Obesity;Heart Failure;Hypertension  Heart Failure;Diabetes;Hypertension;Weight Management/Obesity  Weight Management/Obesity;Heart Failure;Lipids;Hypertension;Diabetes     Review  Stacie Mcdonald's weight has been holding steady.  She has not been having any heart failure symptoms at home.  She is staying on top of it with taking her dieuretic daily.   Her blood pressures have been getting better recently.  She checks them on occasion at home.    Stacie Mcdonald checks her weight daily and monitors for symtpoms of HF.  She does  check her BP and BG occasionally at home.  BP has been lower the past few weeks.  It is staying in the 120-130 range.  She hasn't been able to tell she has increased energy but wants to reach that goal with exercise.  She does [employer] classes on days not at [facility].  Stacie Mcdonald is monitoring her weight daily.  She takes all meds as directed and checks BG as directed by her Dr. Her a1c was 6.5 last time it was measured.  She does still have SOB when walking (not on TM or other equipment).  She will discuss this with her Dr. to see if further evaluation is needed.  She will continue SS exercise when she completes HT.  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     Expected Outcomes  Short: Continue to work on weight loss.  Long: Continue to manage her heart failure.    Short - continue to exercise and eat heart healthy Long - manage health with exercise and healthy diet  Short - discuss SOB with her Dr Laverta Baltimore - maintain exercise  on her own        Core Components/Risk Factors/Patient Goals at Discharge (Final Review):  Goals and Risk Factor Review - 08/04/18 0811      Core Components/Risk Factors/Patient Goals Review   Personal Goals Review  Weight Management/Obesity;Heart Failure;Lipids;Hypertension;Diabetes    Review  Stacie Mcdonald is monitoring her weight daily.  She takes all meds as directed and checks BG as directed by her Dr. Her a1c was 6.5 last time it was measured.  She does still have SOB when walking (not on TM or other equipment).  She will discuss this with her Dr. to see if further evaluation is needed.  She will continue SS exercise when she completes HT.  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    Expected Outcomes  Short - discuss SOB with her Dr Laverta Baltimore - maintain exercise on her own       ITP Comments: ITP Comments    Row Name 05/12/18 1346 05/12/18 1403 06/01/18 0626 06/29/18 0810 07/27/18 0620    ITP Comments  Stacie Mcdonald is concerned about her oxgyen level since she is short of breath still walking at times. We will have her on a cont. pulse ox even on Room Air. Stacie Mcdonald noticed that her pulse ox briefly on her 6 minute walk was 89% on Room air.  Stacie Mcdonald has had 10/10 severe neck pain before her Cat Scan of her neck.  Stacie Mcdonald reports that she has chronic neck pain of 3/10 all the time. She has tried massage and other things but still  has the pain. Stacie Mcdonald doesn't want to take stron pain medicines. Stacie Mcdonald recently had skin grafts done since she fell asleep with a heating pad. 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 It didn't hurt her to walk for her 6 minute walk in the hallway.   Med Review completed. Initial ITP created. Diagnosis can be found in [facility] 5/23  30 day review. Continue with ITP unless directed changes per Medical Director review.  New to program  30 day review completed. ITP sent to Dr. Ramonita Lab, covering for Dr. Emily Filbert, Medical Director of Cardiac Rehab. Continue with ITP unless changes are made by physician  30 day review completed. ITP sent to Dr. Emily Filbert, Medical Director of Cardiac Rehab. Continue with ITP unless changes are made by physician   Row Name 08/18/18 0806           ITP Comments  Discharge ITP sent and signed by Dr. Sabra Heck.  Discharge Summary routed to PCP and cardiologist.          Comments: Discharge ITP

## 2018-08-18 NOTE — Progress Notes (Signed)
Discharge Progress Report  Patient Details  Name: Stacie Mcdonald MRN: [id number] Date of Birth: 11/13/1946 Referring Provider:     Cardiac Rehab from 05/12/2018 in [facility] Cardiac and Pulmonary Rehab  Referring Provider  [facility]       Number of Visits: 31  Reason for Discharge:  Patient reached a stable level of exercise. Patient independent in their exercise. Patient has met program and personal goals.  Smoking History:  Social History   Tobacco Use  Smoking Status Never Smoker  Smokeless Tobacco Never Used    Diagnosis:  Status post coronary artery stent placement  ADL UCSD:   Initial Exercise Prescription: Initial Exercise Prescription - 05/12/18 1300      Date of Initial Exercise RX and Referring Provider   Date  05/12/18    Referring Provider  [place]      Treadmill   MPH  2    Grade  0    Minutes  15    METs  2.5      NuStep   Level  2    SPM  80    Minutes  15    METs  2.6      REL-XR   Level  2    Speed  50    Minutes  15    METs  2.5      Prescription Details   Frequency (times per week)  2    Duration  Progress to 30 minutes of continuous aerobic without signs/symptoms of physical distress      Intensity   THRR 40-80% of Max Heartrate  115-136    Ratings of Perceived Exertion  11-13    Perceived Dyspnea  0-4      Resistance Training   Training Prescription  Yes    Weight  2 lb    Reps  10-15       Discharge Exercise Prescription (Final Exercise Prescription Changes): Exercise Prescription Changes - 08/15/18 1500      Response to Exercise   Blood Pressure (Admit)  146/62    Blood Pressure (Exercise)  134/68    Blood Pressure (Exit)  122/60    Heart Rate (Admit)  79 bpm    Heart Rate (Exercise)  99 bpm    Heart Rate (Exit)  78 bpm    Rating of Perceived Exertion (Exercise)  15    Symptoms  none    Duration  Continue with 30 min of aerobic exercise without signs/symptoms of physical distress.    Intensity  THRR unchanged       Progression   Progression  Continue to progress workloads to maintain intensity without signs/symptoms of physical distress.    Average METs  2.88      Resistance Training   Training Prescription  Yes    Weight  2 lbs    Reps  10-15      Interval Training   Interval Training  No      Treadmill   MPH  2    Grade  0    Minutes  15    METs  2.53      NuStep   Level  2    Minutes  15    METs  2.4      REL-XR   Level  4    Minutes  15    METs  3.6      Home Exercise Plan   Plans to continue exercise at  [facility] (comment)  walking and [employer] [place]   Frequency  Add 3 additional days to program exercise sessions.    Initial Home Exercises Provided  06/02/18       Functional Capacity: 6 Minute Walk    Row Name 05/12/18 1343 08/04/18 0823       6 Minute Walk   Phase  -  Discharge    Distance  1214 feet  1370 feet    Distance % Change  -  156 %    Distance Feet Change  -  12.8 ft    Walk Time  6 minutes  6 minutes    # of Rest Breaks  0  0    MPH  2.3  2.6    METS  2.6  2.95    RPE  16  16    Perceived Dyspnea   3  3    VO2 Peak  9.1  10.33    Symptoms  No  No    Resting HR  95 bpm  88 bpm    Resting BP  142/70  110/62    Resting Oxygen Saturation   93 %  -    Exercise Oxygen Saturation  during 6 min walk  91 %  97 %    Max Ex. HR  113 bpm  116 bpm    Max Ex. BP  152/72  158/84    2 Minute Post BP  150/72  -       Psychological, QOL, Others - Outcomes: PHQ 2/9: Depression screen [facility] 2/9 06/09/2018 05/12/2018 01/06/2018 06/08/2017 06/08/2016  Decreased Interest 0 0 0 0 0  Down, Depressed, Hopeless 0 0 0 0 0  PHQ - 2 Score 0 0 0 0 0  Altered sleeping - 1 - 0 -  Tired, decreased energy - 2 - 0 -  Change in appetite - 0 - 0 -  Feeling bad or failure about yourself  - 0 - 0 -  Trouble concentrating - 0 - 0 -  Moving slowly or fidgety/restless - 0 - 0 -  Suicidal thoughts - 0 - 0 -  PHQ-9 Score - 3 - 0 -  Difficult doing work/chores - Somewhat  difficult - - -    Quality of Life: Quality of Life - 05/12/18 1409      Quality of Life   Select  Quality of Life      Quality of Life Scores   Health/Function Pre  22.87 %    Socioeconomic Pre  24.43 %    Psych/Spiritual Pre  26.79 %    Family Pre  27.6 %    GLOBAL Pre  24.69 %       Personal Goals: Goals established at orientation with interventions provided to work toward goal. Personal Goals and Risk Factors at Admission - 05/12/18 1356      Core Components/Risk Factors/Patient Goals on Admission   Improve shortness of breath with ADL's  Yes    Intervention  Provide education, individualized exercise plan and daily activity instruction to help decrease symptoms of SOB with activities of daily living.    Expected Outcomes  Short Term: Improve cardiorespiratory fitness to achieve a reduction of symptoms when performing ADLs;Long Term: Be able to perform more ADLs without symptoms or delay the onset of symptoms    Heart Failure  Yes   History of heart failure in the past   Intervention  Provide a combined exercise and nutrition program that is supplemented with education, support  and counseling about heart failure. Directed toward relieving symptoms such as shortness of breath, decreased exercise tolerance, and extremity edema.    Expected Outcomes  Improve functional capacity of life;Short term: Attendance in program 2-3 days a week with increased exercise capacity. Reported lower sodium intake. Reported increased fruit and vegetable intake. Reports medication compliance.;Short term: Daily weights obtained and reported for increase. Utilizing diuretic protocols set by physician.;Long term: Adoption of self-care skills and reduction of barriers for early signs and symptoms recognition and intervention leading to self-care maintenance.    Lipids  Yes    Intervention  Provide education and support for participant on nutrition & aerobic/resistive exercise along with prescribed  medications to achieve LDL '70mg'$ , HDL >'40mg'$ .    Expected Outcomes  Short Term: Participant states understanding of desired cholesterol values and is compliant with medications prescribed. Participant is following exercise prescription and nutrition guidelines.;Long Term: Cholesterol controlled with medications as prescribed, with individualized exercise RX and with personalized nutrition plan. Value goals: LDL < '70mg'$ , HDL > 40 mg.        Personal Goals Discharge: Goals and Risk Factor Review    Row Name 06/21/18 0802 07/05/18 0801 08/04/18 0811         Core Components/Risk Factors/Patient Goals Review   Personal Goals Review  Weight Management/Obesity;Heart Failure;Hypertension  Heart Failure;Diabetes;Hypertension;Weight Management/Obesity  Weight Management/Obesity;Heart Failure;Lipids;Hypertension;Diabetes     Review  Serenitee's weight has been holding steady.  She has not been having any heart failure symptoms at home.  She is staying on top of it with taking her dieuretic daily.   Her blood pressures have been getting better recently.  She checks them on occasion at home.    Jenavie checks her weight daily and monitors for symtpoms of HF.  She does check her BP and BG occasionally at home.  BP has been lower the past few weeks.  It is staying in the 120-130 range.  She hasn't been able to tell she has increased energy but wants to reach that goal with exercise.  She does [employer] classes on days not at [facility].  Susane is monitoring her weight daily.  She takes all meds as directed and checks BG as directed by her Dr. Her a1c was 6.5 last time it was measured.  She does still have SOB when walking (not on TM or other equipment).  She will discuss this with her Dr. to see if further evaluation is needed.  She will continue SS exercise when she completes HT.  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     Expected Outcomes  Short: Continue to work on  weight loss.  Long: Continue to manage her heart failure.    Short - continue to exercise and eat heart healthy Long - manage health with exercise and healthy diet  Short - discuss SOB with her Dr Laverta Baltimore - maintain exercise on her own        Exercise Goals and Review: Exercise Goals    Row Name 05/12/18 1343             Exercise Goals   Increase Physical Activity  Yes       Intervention  Provide advice, education, support and counseling about physical activity/exercise needs.;Develop an individualized exercise prescription for aerobic and resistive training based on initial evaluation findings, risk stratification, comorbidities and participant's personal goals.       Expected Outcomes  Short Term: Attend rehab  on a regular basis to increase amount of physical activity.;Long Term: Add in home exercise to make exercise part of routine and to increase amount of physical activity.;Long Term: Exercising regularly at least 3-5 days a week.       Increase Strength and Stamina  Yes       Intervention  Provide advice, education, support and counseling about physical activity/exercise needs.;Develop an individualized exercise prescription for aerobic and resistive training based on initial evaluation findings, risk stratification, comorbidities and participant's personal goals.       Expected Outcomes  Short Term: Increase workloads from initial exercise prescription for resistance, speed, and METs.;Short Term: Perform resistance training exercises routinely during rehab and add in resistance training at home;Long Term: Improve cardiorespiratory fitness, muscular endurance and strength as measured by increased METs and functional capacity (6MWT)       Able to understand and use rate of perceived exertion (RPE) scale  Yes       Intervention  Provide education and explanation on how to use RPE scale       Expected Outcomes  Short Term: Able to use RPE daily in rehab to express subjective intensity level;Long  Term:  Able to use RPE to guide intensity level when exercising independently       Able to understand and use Dyspnea scale  Yes       Intervention  Provide education and explanation on how to use Dyspnea scale       Expected Outcomes  Short Term: Able to use Dyspnea scale daily in rehab to express subjective sense of shortness of breath during exertion;Long Term: Able to use Dyspnea scale to guide intensity level when exercising independently       Knowledge and understanding of Target Heart Rate Range (THRR)  Yes       Intervention  Provide education and explanation of THRR including how the numbers were predicted and where they are located for reference       Expected Outcomes  Short Term: Able to state/look up THRR;Short Term: Able to use daily as guideline for intensity in rehab;Long Term: Able to use THRR to govern intensity when exercising independently       Able to check pulse independently  Yes       Intervention  Provide education and demonstration on how to check pulse in carotid and radial arteries.;Review the importance of being able to check your own pulse for safety during independent exercise       Expected Outcomes  Short Term: Able to explain why pulse checking is important during independent exercise;Long Term: Able to check pulse independently and accurately       Understanding of Exercise Prescription  Yes       Intervention  Provide education, explanation, and written materials on patient's individual exercise prescription       Expected Outcomes  Short Term: Able to explain program exercise prescription;Long Term: Able to explain home exercise prescription to exercise independently          Nutrition & Weight - Outcomes: Pre Biometrics - 05/12/18 1342      Pre Biometrics   Height  '5\' 3"'$  (1.6 m)    Weight  178 lb 3.2 oz (80.8 kg)    Waist Circumference  37.5 inches    Hip Circumference  43.25 inches    Waist to Hip Ratio  0.87 %    BMI (Calculated)  31.57    Single  Leg Stand  25 seconds  Nutrition: Nutrition Therapy & Goals - 05/12/18 1355      Nutrition Therapy   RD appointment deferred  Yes    Drug/Food Interactions  Statins/Certain Fruits      Personal Nutrition Goals   Nutrition Goal  To cont to eat heart healthy.      Intervention Plan   Intervention  Prescribe, educate and counsel regarding individualized specific dietary modifications aiming towards targeted core components such as weight, hypertension, lipid management, diabetes, heart failure and other comorbidities.;Nutrition handout(s) given to patient.    Expected Outcomes  Short Term Goal: Understand basic principles of dietary content, such as calories, fat, sodium, cholesterol and nutrients.;Short Term Goal: A plan has been developed with personal nutrition goals set during dietitian appointment.;Long Term Goal: Adherence to prescribed nutrition plan.       Nutrition Discharge: Nutrition Assessments - 05/12/18 1406      MEDFICTS Scores   Pre Score  --   Patient did not want to fill out form      Education Questionnaire Score: Knowledge Questionnaire Score - 05/12/18 1409      Knowledge Questionnaire Score   Pre Score  26/26       Goals reviewed with patient; copy given to patient.

## 2018-08-18 NOTE — Progress Notes (Signed)
Daily Session Note  Patient Details  Name: Stacie Mcdonald MRN: [id number] Date of Birth: [birth year] Referring Provider:     Cardiac Rehab from 05/12/2018 in [facility] Cardiac and Pulmonary Rehab  Referring Provider  [facility]      Encounter Date: 08/18/2018  Check In: Session Check In - 08/18/18 0805      Check-In   Supervising physician immediately available to respond to emergencies  See telemetry face sheet for immediately available ER MD    Location  [facility]-Cardiac & Pulmonary Rehab    Staff Present  Alberteen Sam, MA, RCEP, CCRP, Exercise Physiologist;Amanda Oletta Darter, BA, ACSM CEP, Exercise Physiologist;Carroll Enterkin, RN, BSN    Medication changes reported      No    Fall or balance concerns reported     No    Warm-up and Cool-down  Performed on first and last piece of equipment    Resistance Training Performed  Yes    VAD Patient?  No    PAD/SET Patient?  No      Pain Assessment   Currently in Pain?  No/denies          Social History   Tobacco Use  Smoking Status Never Smoker  Smokeless Tobacco Never Used    Goals Met:  Independence with exercise equipment Exercise tolerated well No report of cardiac concerns or symptoms Strength training completed today  Goals Unmet:  Not Applicable  Comments:  Avangeline graduated today from  rehab with 32 sessions completed.  Details of the patient's exercise prescription and what She needs to do in order to continue the prescription and progress were discussed with patient.  Patient was given a copy of prescription and goals.  Patient verbalized understanding.  Ayannah plans to continue to exercise by going to [employer] [facility] [place].    Dr. Emily Filbert is Medical Director for [facility].

## 2018-08-31 DIAGNOSIS — R0602 Shortness of breath: Secondary | ICD-10-CM | POA: Diagnosis not present

## 2018-08-31 DIAGNOSIS — R918 Other nonspecific abnormal finding of lung field: Secondary | ICD-10-CM | POA: Diagnosis not present

## 2018-08-31 DIAGNOSIS — I272 Pulmonary hypertension, unspecified: Secondary | ICD-10-CM | POA: Diagnosis not present

## 2018-09-08 ENCOUNTER — Ambulatory Visit
Admission: RE | Admit: 2018-09-08 | Discharge: 2018-09-08 | Disposition: A | Discharge status: Home / Self Care | Payer: Medicare Other | Source: Ambulatory Visit | Attending: Internal Medicine | Admitting: Internal Medicine

## 2018-09-08 DIAGNOSIS — Z853 Personal history of malignant neoplasm of breast: Secondary | ICD-10-CM | POA: Insufficient documentation

## 2018-09-08 DIAGNOSIS — T386X5A Adverse effect of antigonadotrophins, antiestrogens, antiandrogens, not elsewhere classified, initial encounter: Secondary | ICD-10-CM | POA: Diagnosis not present

## 2018-09-08 DIAGNOSIS — M85852 Other specified disorders of bone density and structure, left thigh: Secondary | ICD-10-CM | POA: Diagnosis not present

## 2018-09-08 DIAGNOSIS — Z780 Asymptomatic menopausal state: Secondary | ICD-10-CM | POA: Insufficient documentation

## 2018-09-08 DIAGNOSIS — Z1239 Encounter for other screening for malignant neoplasm of breast: Secondary | ICD-10-CM

## 2018-09-08 DIAGNOSIS — Z1231 Encounter for screening mammogram for malignant neoplasm of breast: Secondary | ICD-10-CM | POA: Diagnosis not present

## 2018-09-08 DIAGNOSIS — Z13820 Encounter for screening for osteoporosis: Secondary | ICD-10-CM | POA: Diagnosis not present

## 2018-09-08 DIAGNOSIS — M818 Other osteoporosis without current pathological fracture: Secondary | ICD-10-CM

## 2018-10-10 DIAGNOSIS — I272 Pulmonary hypertension, unspecified: Secondary | ICD-10-CM | POA: Diagnosis not present

## 2018-10-10 DIAGNOSIS — G4733 Obstructive sleep apnea (adult) (pediatric): Secondary | ICD-10-CM | POA: Diagnosis not present

## 2018-10-10 DIAGNOSIS — G4710 Hypersomnia, unspecified: Secondary | ICD-10-CM | POA: Diagnosis not present

## 2018-10-10 DIAGNOSIS — R0602 Shortness of breath: Secondary | ICD-10-CM | POA: Diagnosis not present

## 2018-10-11 DIAGNOSIS — I10 Essential (primary) hypertension: Secondary | ICD-10-CM | POA: Diagnosis not present

## 2018-10-11 DIAGNOSIS — I2510 Atherosclerotic heart disease of native coronary artery without angina pectoris: Secondary | ICD-10-CM | POA: Diagnosis not present

## 2018-10-11 DIAGNOSIS — E669 Obesity, unspecified: Secondary | ICD-10-CM | POA: Diagnosis not present

## 2018-10-11 DIAGNOSIS — R000 Tachycardia, unspecified: Secondary | ICD-10-CM | POA: Diagnosis not present

## 2018-10-11 DIAGNOSIS — K219 Gastro-esophageal reflux disease without esophagitis: Secondary | ICD-10-CM | POA: Diagnosis not present

## 2018-10-11 DIAGNOSIS — I509 Heart failure, unspecified: Secondary | ICD-10-CM | POA: Diagnosis not present

## 2018-10-11 DIAGNOSIS — R0602 Shortness of breath: Secondary | ICD-10-CM | POA: Diagnosis not present

## 2018-10-11 DIAGNOSIS — J449 Chronic obstructive pulmonary disease, unspecified: Secondary | ICD-10-CM | POA: Diagnosis not present

## 2018-10-11 DIAGNOSIS — I5042 Chronic combined systolic (congestive) and diastolic (congestive) heart failure: Secondary | ICD-10-CM | POA: Diagnosis not present

## 2018-10-11 DIAGNOSIS — R002 Palpitations: Secondary | ICD-10-CM | POA: Diagnosis not present

## 2018-10-11 DIAGNOSIS — I209 Angina pectoris, unspecified: Secondary | ICD-10-CM | POA: Diagnosis not present

## 2018-10-11 DIAGNOSIS — E782 Mixed hyperlipidemia: Secondary | ICD-10-CM | POA: Diagnosis not present

## 2018-10-12 DIAGNOSIS — G4733 Obstructive sleep apnea (adult) (pediatric): Secondary | ICD-10-CM | POA: Diagnosis not present

## 2018-10-28 DIAGNOSIS — R918 Other nonspecific abnormal finding of lung field: Secondary | ICD-10-CM | POA: Diagnosis not present

## 2018-11-01 ENCOUNTER — Other Ambulatory Visit: Payer: Self-pay | Admitting: Specialist

## 2018-11-01 DIAGNOSIS — R918 Other nonspecific abnormal finding of lung field: Secondary | ICD-10-CM

## 2018-11-14 ENCOUNTER — Ambulatory Visit
Admission: RE | Admit: 2018-11-14 | Discharge: 2018-11-14 | Disposition: A | Discharge status: Home / Self Care | Payer: Medicare Other | Source: Ambulatory Visit | Attending: Specialist | Admitting: Specialist

## 2018-11-14 DIAGNOSIS — R918 Other nonspecific abnormal finding of lung field: Secondary | ICD-10-CM | POA: Diagnosis not present

## 2018-12-01 ENCOUNTER — Ambulatory Visit: Payer: Medicare Other | Attending: Specialist

## 2018-12-01 DIAGNOSIS — G4733 Obstructive sleep apnea (adult) (pediatric): Secondary | ICD-10-CM | POA: Diagnosis not present

## 2018-12-05 DIAGNOSIS — R0602 Shortness of breath: Secondary | ICD-10-CM | POA: Diagnosis not present

## 2018-12-05 DIAGNOSIS — I272 Pulmonary hypertension, unspecified: Secondary | ICD-10-CM | POA: Diagnosis not present

## 2018-12-05 DIAGNOSIS — G4733 Obstructive sleep apnea (adult) (pediatric): Secondary | ICD-10-CM | POA: Diagnosis not present

## 2019-03-29 LAB — CBC AND DIFFERENTIAL
Hemoglobin: 13.3 (ref 12.0–16.0)
Platelets: 249 (ref 150–399)
WBC: 7.1

## 2019-03-29 LAB — HEPATIC FUNCTION PANEL
ALT: 29 (ref 7–35)
AST: 21 (ref 13–35)
Alkaline Phosphatase: 51 (ref 25–125)
Bilirubin, Total: 0.5

## 2019-04-03 DIAGNOSIS — J449 Chronic obstructive pulmonary disease, unspecified: Secondary | ICD-10-CM | POA: Diagnosis not present

## 2019-04-03 DIAGNOSIS — G4733 Obstructive sleep apnea (adult) (pediatric): Secondary | ICD-10-CM | POA: Diagnosis not present

## 2019-04-04 DIAGNOSIS — R002 Palpitations: Secondary | ICD-10-CM | POA: Diagnosis not present

## 2019-04-04 DIAGNOSIS — I5042 Chronic combined systolic (congestive) and diastolic (congestive) heart failure: Secondary | ICD-10-CM | POA: Diagnosis not present

## 2019-04-04 DIAGNOSIS — I2510 Atherosclerotic heart disease of native coronary artery without angina pectoris: Secondary | ICD-10-CM | POA: Diagnosis not present

## 2019-04-04 DIAGNOSIS — J449 Chronic obstructive pulmonary disease, unspecified: Secondary | ICD-10-CM | POA: Diagnosis not present

## 2019-04-04 DIAGNOSIS — Z7689 Persons encountering health services in other specified circumstances: Secondary | ICD-10-CM | POA: Diagnosis not present

## 2019-04-04 DIAGNOSIS — R0600 Dyspnea, unspecified: Secondary | ICD-10-CM | POA: Diagnosis not present

## 2019-04-04 DIAGNOSIS — E782 Mixed hyperlipidemia: Secondary | ICD-10-CM | POA: Diagnosis not present

## 2019-04-04 DIAGNOSIS — I209 Angina pectoris, unspecified: Secondary | ICD-10-CM | POA: Diagnosis not present

## 2019-04-04 DIAGNOSIS — I10 Essential (primary) hypertension: Secondary | ICD-10-CM | POA: Diagnosis not present

## 2019-04-04 DIAGNOSIS — R0602 Shortness of breath: Secondary | ICD-10-CM | POA: Diagnosis not present

## 2019-04-04 DIAGNOSIS — I509 Heart failure, unspecified: Secondary | ICD-10-CM | POA: Diagnosis not present

## 2019-04-04 DIAGNOSIS — K219 Gastro-esophageal reflux disease without esophagitis: Secondary | ICD-10-CM | POA: Diagnosis not present

## 2019-04-06 DIAGNOSIS — E782 Mixed hyperlipidemia: Secondary | ICD-10-CM | POA: Diagnosis not present

## 2019-04-06 DIAGNOSIS — I209 Angina pectoris, unspecified: Secondary | ICD-10-CM | POA: Diagnosis not present

## 2019-04-06 DIAGNOSIS — I10 Essential (primary) hypertension: Secondary | ICD-10-CM | POA: Diagnosis not present

## 2019-04-06 DIAGNOSIS — I25118 Atherosclerotic heart disease of native coronary artery with other forms of angina pectoris: Secondary | ICD-10-CM | POA: Diagnosis not present

## 2019-04-06 DIAGNOSIS — F40240 Claustrophobia: Secondary | ICD-10-CM | POA: Diagnosis not present

## 2019-04-06 DIAGNOSIS — Z5181 Encounter for therapeutic drug level monitoring: Secondary | ICD-10-CM | POA: Diagnosis not present

## 2019-04-06 DIAGNOSIS — R0600 Dyspnea, unspecified: Secondary | ICD-10-CM | POA: Diagnosis not present

## 2019-04-06 DIAGNOSIS — I509 Heart failure, unspecified: Secondary | ICD-10-CM | POA: Diagnosis not present

## 2019-04-14 DIAGNOSIS — Z08 Encounter for follow-up examination after completed treatment for malignant neoplasm: Secondary | ICD-10-CM | POA: Diagnosis not present

## 2019-04-14 DIAGNOSIS — L821 Other seborrheic keratosis: Secondary | ICD-10-CM | POA: Diagnosis not present

## 2019-04-14 DIAGNOSIS — Z85828 Personal history of other malignant neoplasm of skin: Secondary | ICD-10-CM | POA: Diagnosis not present

## 2019-04-15 ENCOUNTER — Other Ambulatory Visit
Admission: RE | Admit: 2019-04-15 | Discharge: 2019-04-15 | Disposition: A | Discharge status: Home / Self Care | Payer: Medicare Other | Attending: Cardiovascular Disease | Admitting: Cardiovascular Disease

## 2019-04-15 DIAGNOSIS — Z5181 Encounter for therapeutic drug level monitoring: Secondary | ICD-10-CM | POA: Diagnosis not present

## 2019-04-15 LAB — BASIC METABOLIC PANEL
Anion gap: 10 (ref 5–15)
BUN: 19 mg/dL (ref 8–23)
CO2: 26 mmol/L (ref 22–32)
Calcium: 9 mg/dL (ref 8.9–10.3)
Chloride: 105 mmol/L (ref 98–111)
Creatinine, Ser: 0.7 mg/dL (ref 0.44–1.00)
GFR calc Af Amer: >60 mL/min (ref >60)
GFR calc non Af Amer: >60 mL/min (ref >60)
Glucose, Bld: 121 mg/dL — ABNORMAL HIGH (ref 70–99)
Potassium: 4.2 mmol/L (ref 3.5–5.1)
Sodium: 141 mmol/L (ref 135–145)

## 2019-04-28 DIAGNOSIS — F40240 Claustrophobia: Secondary | ICD-10-CM | POA: Diagnosis not present

## 2019-04-28 DIAGNOSIS — I340 Nonrheumatic mitral (valve) insufficiency: Secondary | ICD-10-CM | POA: Diagnosis not present

## 2019-04-28 DIAGNOSIS — M542 Cervicalgia: Secondary | ICD-10-CM | POA: Diagnosis not present

## 2019-07-25 ENCOUNTER — Telehealth: Payer: Self-pay

## 2019-07-25 NOTE — Telephone Encounter (Signed)
Copied from [employer] [phone]. Topic: General - Other >> Jul 25, 2019  2:38 PM Keene Breath wrote: Reason for CRM: Patient is calling back regarding her upcoming appt.  Tried the office, but no answer.  Please call patient back at [phone]

## 2019-07-26 NOTE — Telephone Encounter (Signed)
Called pt and she was wondering if her appt was supposed to be an in office appt or a virtual appt. I explained to pt that her appt with Denisa, LPN will be by telephone and her appt with Dr. Derrel Nip will be a virtual/video visit. Pt gave a verbal understanding.

## 2019-07-27 DIAGNOSIS — E782 Mixed hyperlipidemia: Secondary | ICD-10-CM | POA: Diagnosis not present

## 2019-07-27 DIAGNOSIS — G4733 Obstructive sleep apnea (adult) (pediatric): Secondary | ICD-10-CM | POA: Diagnosis not present

## 2019-07-27 DIAGNOSIS — I25118 Atherosclerotic heart disease of native coronary artery with other forms of angina pectoris: Secondary | ICD-10-CM | POA: Diagnosis not present

## 2019-07-27 DIAGNOSIS — I2089 Other forms of angina pectoris: Secondary | ICD-10-CM

## 2019-07-27 DIAGNOSIS — I10 Essential (primary) hypertension: Secondary | ICD-10-CM | POA: Diagnosis not present

## 2019-07-27 DIAGNOSIS — Z9989 Dependence on other enabling machines and devices: Secondary | ICD-10-CM | POA: Diagnosis not present

## 2019-07-27 DIAGNOSIS — Z23 Encounter for immunization: Secondary | ICD-10-CM | POA: Diagnosis not present

## 2019-07-27 DIAGNOSIS — I208 Other forms of angina pectoris: Secondary | ICD-10-CM

## 2019-07-27 DIAGNOSIS — I5022 Chronic systolic (congestive) heart failure: Secondary | ICD-10-CM | POA: Diagnosis not present

## 2019-07-27 HISTORY — DX: Other forms of angina pectoris: I20.8

## 2019-07-27 HISTORY — DX: Other forms of angina pectoris: I20.89

## 2019-07-27 LAB — LIPID PANEL
Cholesterol: 128 (ref 0–200)
HDL: 43 (ref 35–70)
LDL Cholesterol: 59
Triglycerides: 128 (ref 40–160)

## 2019-07-27 LAB — BASIC METABOLIC PANEL
BUN: 20 (ref 4–21)
Creatinine: 0.8 (ref 0.5–1.1)
Glucose: 114
Potassium: 3.8 (ref 3.4–5.3)
Sodium: 141 (ref 137–147)

## 2019-07-28 ENCOUNTER — Encounter: Payer: Self-pay | Admitting: Internal Medicine

## 2019-07-28 ENCOUNTER — Ambulatory Visit (INDEPENDENT_AMBULATORY_CARE_PROVIDER_SITE_OTHER): Payer: Medicare Other | Admitting: Internal Medicine

## 2019-07-28 ENCOUNTER — Ambulatory Visit (INDEPENDENT_AMBULATORY_CARE_PROVIDER_SITE_OTHER): Payer: Medicare Other

## 2019-07-28 ENCOUNTER — Other Ambulatory Visit: Payer: Self-pay

## 2019-07-28 VITALS — BP 132/70 | HR 86 | Temp 96.7°F | Resp 18 | Ht 63.0 in | Wt 175.0 lb

## 2019-07-28 DIAGNOSIS — I25118 Atherosclerotic heart disease of native coronary artery with other forms of angina pectoris: Secondary | ICD-10-CM | POA: Diagnosis not present

## 2019-07-28 DIAGNOSIS — Z86010 Personal history of colonic polyps: Secondary | ICD-10-CM

## 2019-07-28 DIAGNOSIS — E538 Deficiency of other specified B group vitamins: Secondary | ICD-10-CM

## 2019-07-28 DIAGNOSIS — Z0000 Encounter for general adult medical examination without abnormal findings: Secondary | ICD-10-CM

## 2019-07-28 DIAGNOSIS — R918 Other nonspecific abnormal finding of lung field: Secondary | ICD-10-CM

## 2019-07-28 DIAGNOSIS — I10 Essential (primary) hypertension: Secondary | ICD-10-CM

## 2019-07-28 DIAGNOSIS — F5102 Adjustment insomnia: Secondary | ICD-10-CM

## 2019-07-28 DIAGNOSIS — R5383 Other fatigue: Secondary | ICD-10-CM

## 2019-07-28 DIAGNOSIS — E119 Type 2 diabetes mellitus without complications: Secondary | ICD-10-CM

## 2019-07-28 DIAGNOSIS — E785 Hyperlipidemia, unspecified: Secondary | ICD-10-CM

## 2019-07-28 DIAGNOSIS — E1169 Type 2 diabetes mellitus with other specified complication: Secondary | ICD-10-CM

## 2019-07-28 MED ORDER — DIAZEPAM 5 MG PO TABS: 5.0000 mg | ORAL_TABLET | Freq: Two times a day (BID) | ORAL | 1 refills | Status: DC | PRN

## 2019-07-28 MED ORDER — DIAZEPAM 5 MG PO TABS: 5.0000 mg | ORAL_TABLET | Freq: Two times a day (BID) | ORAL | 1 refills | Status: AC | PRN

## 2019-07-28 NOTE — Progress Notes (Addendum)
Subjective:   Stacie Mcdonald is a 73 y.o. female who presents for Medicare Annual (Subsequent) preventive examination.  Review of Systems:  No ROS.  Medicare Wellness Virtual Visit.  Visual/audio telehealth visit, UTA vital signs.   See social history for additional risk factors.   Cardiac Risk Factors include: advanced age (>89men, >80 women)     Objective:     Vitals: There were no vitals taken for this visit.  There is no height or weight on file to calculate BMI.  Advanced Directives 07/28/2019 06/09/2018 05/12/2018 04/07/2018 06/08/2017 06/08/2016  Does Patient Have a Medical Advance Directive? Yes Yes Yes Yes Yes Yes  Type of Paramedic of [place];Living will [place];Living will - [place];Living will [place];Living will [place];Living will  Does patient want to make changes to medical advance directive? No - Patient declined No - Patient declined No - Patient declined No - Patient declined No - Patient declined -  Copy of [place] in Chart? No - copy requested No - copy requested - No - copy requested No - copy requested -    Tobacco Social History   Tobacco Use  Smoking Status Never Smoker  Smokeless Tobacco Never Used     Counseling given: Not Answered   Clinical Intake:  Pre-visit preparation completed: Yes        Diabetes: Yes(Followed by pcp)  How often do you need to have someone help you when you read instructions, pamphlets, or other written materials from your doctor or pharmacy?: 1 - Never  Interpreter Needed?: No     Past Medical History:  Diagnosis Date  . Allergy   . Arthritis   . Asthma   . Breast Cancer 1991   R mastectomy. XRT , chemo  . Breast cancer ([place]) 1991   RT MASTECTOMY  . CAD (coronary artery disease) 2008   50% LAD occlusion 2009,  stent in mid LAD 2008 for 95%   . Chicken pox   . Colon polyps  April 2009   by Colonoscopy, Gustavo Lah  . GERD (gastroesophageal reflux disease)   . Heart disease   . Hyperlipidemia   . Hypertension   . 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 Rheumatic fever   . Urinary tract infection    Past Surgical History:  Procedure Laterality Date  . BREAST BIOPSY     right breast reconstruction S/P mastectomy  . BREAST SURGERY     right mastectomy  . CORONARY ANGIOPLASTY WITH STENT PLACEMENT  Jan 2013   mid RCA 75% occlusion resolved to 0%, [place]  . CORONARY STENT INTERVENTION N/A 04/07/2018   Procedure: CORONARY STENT INTERVENTION;  Surgeon: Yolonda Kida, MD;  Location: [facility];  Service: Cardiovascular;  Laterality: N/A;  . MASTECTOMY Right 1991   BREAST CA  . RIGHT/LEFT HEART CATH AND CORONARY ANGIOGRAPHY N/A 04/07/2018   Procedure: RIGHT/LEFT HEART CATH AND CORONARY ANGIOGRAPHY;  Surgeon: Yolonda Kida, MD;  Location: [facility];  Service: Cardiovascular;  Laterality: N/A;  . TONSILLECTOMY     Family History  Problem Relation Age of Onset  . Heart disease Mother 4       massive MI  . Heart disease Father 21       AMI  . Cancer Father        metastatic lung CA  . Diabetes Sister   .  Cancer Maternal Aunt   . Breast cancer Maternal Aunt 68  . Breast cancer Cousin        68s  . Breast cancer Cousin        38s   Social History   Socioeconomic History  . Marital status: Married    Spouse name: Blondell Reveal   . Number of children: 4  . Years of education: bachelors  . Highest education level: Not on file  Occupational History  . Occupation: RETIRED  Social Needs  . Financial resource strain: Not hard at all  . Food insecurity    Worry: Never true    Inability: Never true  . Transportation needs    Medical: No    Non-medical: No  Tobacco Use  . Smoking status: Never Smoker  . Smokeless tobacco: Never Used  Substance and Sexual Activity  . Alcohol use: Yes    Comment:  occasional wine  . Drug use: No  . Sexual activity: Yes  Lifestyle  . Physical activity    Days per week: 3 days    Minutes per session: 30 min  . Stress: Not at all  Relationships  . Social connections    Talks on phone: More than three times a week    Gets together: More than three times a week    Attends religious service: More than 4 times per year    Active member of club or organization: Yes    Attends meetings of clubs or organizations: More than 4 times per year    Relationship status: Married  Other Topics Concern  . Not on file  Social History Narrative  . Not on file    Outpatient Encounter Medications as of 07/28/2019  Medication Sig  . aspirin 81 MG tablet Take 81 mg by mouth daily.   . clopidogrel (PLAVIX) 75 MG tablet TAKE ONE TABLET BY MOUTH DAILY  . furosemide (LASIX) 40 MG tablet Take 40 mg by mouth daily.  Marland Kitchen GLUCOSAMINE-CHONDROITIN PO Take 2 tablets by mouth daily.  . isosorbide mononitrate (IMDUR) 60 MG 24 hr tablet Take 120 mg by mouth daily.   . Lancets (FREESTYLE) lancets uSE TO CHECK SUGARS ONCE DAILY e11.9  . loratadine (CLARITIN) 10 MG tablet Take 10 mg by mouth daily as needed for allergies.  Marland Kitchen losartan (COZAAR) 100 MG tablet TAKE 1 TABLET (100 MG TOTAL) BY MOUTH DAILY.  . metoprolol (TOPROL-XL) 200 MG 24 hr tablet TAKE 1 TABLET BY MOUTH DAILY.  Marland Kitchen omeprazole (PRILOSEC) 20 MG capsule Take 1 capsule (20 mg total) by mouth daily. (Patient taking differently: Take 20 mg by mouth daily as needed (acid reflux). )  . ONE TOUCH ULTRA TEST test strip CHECK DAILY  . PRALUENT 75 MG/ML SOAJ   . niacin 500 MG tablet Take 1 tablet (500 mg total) by mouth daily. (Patient not taking: Reported on 07/28/2019)  . [DISCONTINUED] diazepam (VALIUM) 10 MG tablet Take 1 tablet (10 mg total) by mouth at bedtime as needed for anxiety (muscle spasm).  . [DISCONTINUED] digoxin (LANOXIN) 0.125 MG tablet Take 0.125 mg by mouth daily.  . [DISCONTINUED] potassium chloride SA  (K-DUR,KLOR-CON) 20 MEQ tablet Take 1 tablet (20 mEq total) by mouth daily.   No facility-administered encounter medications on file as of 07/28/2019.     Activities of Daily Living In your present state of health, do you have any difficulty performing the following activities: 07/28/2019  Hearing? N  Vision? N  Difficulty concentrating or making decisions? N  Walking or climbing stairs? N  Dressing or bathing? N  Doing errands, shopping? N  Preparing Food and eating ? N  Using the Toilet? N  In the past six months, have you accidently leaked urine? N  Do you have problems with loss of bowel control? N  Managing your Medications? N  Managing your Finances? N  Housekeeping or managing your Housekeeping? N  Some recent data might be hidden    Patient Care Team: Crecencio Mc, MD as PCP - General (Internal Medicine)    Assessment:   This is a routine wellness examination for Stacie Mcdonald.  I connected with patient 07/28/19 at 10:00 AM EDT by a video/audio enabled telemedicine application and verified that I am speaking with the correct person using two identifiers. Patient stated full name and DOB. Patient gave permission to continue with virtual visit. Patient's location was at home and Nurse's location was at [place] office.   Vital signs reported from [facility] yesterday:  132/70, R18, P86,T96.7, oxygen 97%  Health Maintenance Due: Eye Exam- Foot Exam- followed by pcp Hgb A1c- 08/07/2018 (6.6)  Update all pending maintenance due as appropriate.   See completed HM at the end of note.   Eye: Visual acuity not assessed. Virtual visit. Wears corrective lenses. Followed by their ophthalmologist every 12 months.  Retinopathy- none reported  Dental: Visits every 6 months.    Hearing: Demonstrates normal hearing during visit.  Safety:  Patient feels safe at home- yes Patient does have smoke detectors at home- yes Patient does wear sunscreen or protective clothing when in direct  sunlight - yes Patient does wear seat belt when in a moving vehicle - yes Patient drives- yes Adequate lighting in walkways free from debris- yes Grab bars and handrails used as appropriate- yes Ambulates with no assistive device Cell phone on person when ambulating outside of the home- yes  Social: Alcohol intake - yes      Smoking history- never Smokers in home? none Illicit drug use? none  Depression: PHQ 2 &9 complete. See screening below. Denies irritability, anhedonia, sadness/tearfullness.  Stable.   Falls: See screening below.    Medication: Taking as directed and without issues.   Covid-19: Precautions and sickness symptoms discussed. Wears mask, social distancing, hand hygiene as appropriate.   Activities of Daily Living Patient denies needing assistance with: household chores, feeding themselves, getting from bed to chair, getting to the toilet, bathing/showering, dressing, managing money, or preparing meals.   Memory: Patient is alert. Patient denies difficulty focusing or concentrating. Correctly identified the president of the [place], season and recall. Patient likes to read for brain stimulation.  BMI- discussed the importance of a healthy diet, water intake and the benefits of aerobic exercise.  Educational material provided.  Physical activity- dance, weights, stretching, 60 minutes  Diet:  Low carb Water: good intake Caffeine: 1-2 cups of caffeine  Advanced Directive: End of life planning; Advance aging; Advanced directives discussed.  Copy of current HCPOA/Living Will requested.    Other Providers Patient Care Team: Crecencio Mc, MD as PCP - General (Internal Medicine)  Exercise Activities and Dietary recommendations Current Exercise Habits: Home exercise routine, Type of exercise: stretching;strength training/weights(dance), Time (Minutes): 60, Frequency (Times/Week): 4, Weekly Exercise (Minutes/Week): 240, Intensity: Mild  Goals    . Increase  lean proteins     Low carb foods    . Maintain Healthy Lifestyle       Fall Risk Fall Risk  07/28/2019 07/28/2019 06/09/2018 05/12/2018 01/06/2018  Falls in the past year? 0 0 No No No  Follow up Falls evaluation completed - - - -   Timed Get Up and Go performed: no, virtual visit  Depression Screen PHQ 2/9 Scores 07/28/2019 06/09/2018 05/12/2018 01/06/2018  PHQ - 2 Score 0 0 0 0  PHQ- 9 Score - - 3 -     Cognitive Function MMSE - Mini Mental State Exam 06/09/2018 06/08/2017  Orientation to time 5 5  Orientation to Place 5 5  Registration 3 3  Attention/ Calculation 5 5  Recall 3 3  Language- name 2 objects 2 2  Language- repeat 1 1  Language- follow 3 step command 3 3  Language- read & follow direction 1 1  Write a sentence 1 1  Copy design 1 1  Total score 30 30     6CIT Screen 07/28/2019  What Year? 0 points  What month? 0 points  What time? 0 points  Count back from 20 0 points  Months in reverse 0 points  Repeat phrase 0 points  Total Score 0    Immunization History  Administered Date(s) Administered  . Influenza Split 09/03/2013, 08/01/2014  . Influenza, High Dose Seasonal PF 09/03/2017, 07/27/2018, 07/27/2019  . Influenza-Unspecified 08/16/2012  . Pneumococcal Conjugate-13 02/13/2015  . Pneumococcal Polysaccharide-23 12/05/2009, 06/08/2017  . Td 03/13/2010  . Tdap 12/06/2003  . Zoster Recombinat (Shingrix) 08/26/2018, 11/30/2018   Screening Tests Health Maintenance  Topic Date Due  . FOOT EXAM  09/10/2017  . OPHTHALMOLOGY EXAM  06/17/2018  . HEMOGLOBIN A1C  01/25/2019  . TETANUS/TDAP  03/13/2020  . MAMMOGRAM  09/08/2020  . COLONOSCOPY  04/12/2024  . INFLUENZA VACCINE  Completed  . DEXA SCAN  Completed  . Hepatitis C Screening  Completed  . PNA vac Low Risk Adult  Completed       Plan:   Keep all routine maintenance appointments.   Follow up 07/28/19 @1030    Medicare Attestation I have personally reviewed: The patient's medical and social  history Their use of alcohol, tobacco or illicit drugs Their current medications and supplements The patient's functional ability including ADLs,fall risks, home safety risks, cognitive, and hearing and visual impairment Diet and physical activities Evidence for depression   In addition, I have reviewed and discussed with patient certain preventive protocols, quality metrics, and best practice recommendations. A written personalized care plan for preventive services as well as general preventive health recommendations were provided to patient via mail.     OBrien-Blaney, Jaree Trinka L, LPN  [id number]     I have reviewed the above information and agree with above.   Deborra Medina, MD

## 2019-07-28 NOTE — Progress Notes (Signed)
Virtual Visit via [email or web address]  This visit type was conducted due to national recommendations for restrictions regarding the COVID-19 pandemic (e.g. social distancing).  This format is felt to be most appropriate for this patient at this time.  All issues noted in this document were discussed and addressed.  No physical exam was performed (except for noted visual exam findings with Video Visits).   I connected with@ on 07/28/19 at 10:30 AM EDT by a video enabled telemedicine application  and verified that I am speaking with the correct person using two identifiers. Location patient: home Location provider: work or home office Persons participating in the virtual visit: patient, provider  I discussed the limitations, risks, security and privacy concerns of performing an evaluation and management service by telephone and the availability of in person appointments. I also discussed with the patient that there may be a patient responsible charge related to this service. The patient expressed understanding and agreed to proceed.   Reason for visit: one year follow up   HPI:  73 yr old female with type 2 DM, CAD with recurrent angina, hyperlipidemia and hypertensin last seen one year ago.  Patient does not check blood sugars more than once a month,  Last one was 135 a month ago in a fasting state.  Dos not recall any above 200 lr less than 80.  No complaints today.  Taking his medications as directed,  Not exercising on a regular basis or trying to lose weight.  Patient voices awareness  of the foods he/she needs to avoid,  And follows a low GI diet about 50% of the time.  Has had an annual diabetic eye exam.  Denies numbness and tingling in lower extremities.  Denies hypoglycemic symptoms.  coronary artery disease with recurrent exertional angina. Patient has been referred by Dr Clayborn Bigness to Dr Edwin Dada  for evaluation of recurrent angina.  She saw him initially in May but since then has only seen his  PA   After undoing  a  functional cardiac MRI but has not had the results explained to her in detail and requested that I do so today. She also inquired a bout the medication changes he made to her  regimen: digoxin was stopped,  Spironolactone started at 12.5 mg in May and increased to 25 mg daily last week. PCKS9 (Praluent) was recommended and started for cholesterol management . Hypertension: patient checks blood pressure twice weekly at home.  Readings have been for the most part <140/80 at rest . Patient is following a reduce salt diet most days and is taking medications as prescribed  ROS: See pertinent positives and negatives per HPI.  Past Medical History:  Diagnosis Date  . Allergy   . Arthritis   . Asthma   . Breast Cancer 1991   R mastectomy. XRT , chemo  . Breast cancer ([place]) 1991   RT MASTECTOMY  . CAD (coronary artery disease) 2008   50% LAD occlusion 2009,  stent in mid LAD 2008 for 95%   . Chicken pox   . Colon polyps April 2009   by Colonoscopy, Gustavo Lah  . GERD (gastroesophageal reflux disease)   . Heart disease   . Hyperlipidemia   . Hypertension   . 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 Rheumatic fever   . Urinary tract infection     Past Surgical History:  Procedure Laterality Date  . BREAST BIOPSY     right breast reconstruction S/P  mastectomy  . BREAST SURGERY     right mastectomy  . CORONARY ANGIOPLASTY WITH STENT PLACEMENT  Jan 2013   mid RCA 75% occlusion resolved to 0%, [place]  . CORONARY STENT INTERVENTION N/A 04/07/2018   Procedure: CORONARY STENT INTERVENTION;  Surgeon: Yolonda Kida, MD;  Location: [facility];  Service: Cardiovascular;  Laterality: N/A;  . MASTECTOMY Right 1991   BREAST CA  . RIGHT/LEFT HEART CATH AND CORONARY ANGIOGRAPHY N/A 04/07/2018   Procedure: RIGHT/LEFT HEART CATH AND CORONARY ANGIOGRAPHY;  Surgeon: Yolonda Kida, MD;  Location: [facility];  Service: Cardiovascular;   Laterality: N/A;  . TONSILLECTOMY      Family History  Problem Relation Age of Onset  . Heart disease Mother 56       massive MI  . Heart disease Father 54       AMI  . Cancer Father        metastatic lung CA  . Diabetes Sister   . Cancer Maternal Aunt   . Breast cancer Maternal Aunt 2  . Breast cancer Cousin        66s  . Breast cancer Cousin        55s    SOCIAL HX:  reports that she has never smoked. She has never used smokeless tobacco. She reports current alcohol use. She reports that she does not use drugs.   Current Outpatient Medications:  .  aspirin 81 MG tablet, Take 81 mg by mouth daily. , Disp: , Rfl:  .  clopidogrel (PLAVIX) 75 MG tablet, TAKE ONE TABLET BY MOUTH DAILY, Disp: 90 tablet, Rfl: 1 .  GLUCOSAMINE-CHONDROITIN PO, Take 2 tablets by mouth daily., Disp: , Rfl:  .  isosorbide mononitrate (IMDUR) 60 MG 24 hr tablet, Take 120 mg by mouth daily. , Disp: , Rfl:  .  Lancets (FREESTYLE) lancets, uSE TO CHECK SUGARS ONCE DAILY e11.9, Disp: 100 each, Rfl: 3 .  loratadine (CLARITIN) 10 MG tablet, Take 10 mg by mouth daily as needed for allergies., Disp: , Rfl:  .  losartan (COZAAR) 100 MG tablet, TAKE 1 TABLET (100 MG TOTAL) BY MOUTH DAILY., Disp: 90 tablet, Rfl: 1 .  metoprolol (TOPROL-XL) 200 MG 24 hr tablet, TAKE 1 TABLET BY MOUTH DAILY., Disp: 90 tablet, Rfl: 1 .  omeprazole (PRILOSEC) 20 MG capsule, Take 1 capsule (20 mg total) by mouth daily. (Patient taking differently: Take 20 mg by mouth daily as needed (acid reflux). ), Disp: 90 capsule, Rfl: 1 .  ONE TOUCH ULTRA TEST test strip, CHECK DAILY, Disp: 100 each, Rfl: 2 .  PRALUENT 75 MG/ML SOAJ, , Disp: , Rfl:  .  diazepam (VALIUM) 5 MG tablet, Take 1 tablet (5 mg total) by mouth every 12 (twelve) hours as needed for up to 10 days for anxiety., Disp: 30 tablet, Rfl: 1 .  niacin 500 MG tablet, Take 1 tablet (500 mg total) by mouth daily. (Patient not taking: Reported on 07/28/2019), Disp: 90 tablet, Rfl: 1 .   spironolactone (ALDACTONE) 25 MG tablet, Take 1 tablet (25 mg total) by mouth daily., Disp: 90 tablet, Rfl: 0  EXAM:  VITALS per patient if applicable:  GENERAL: alert, oriented, appears well and in no acute distress  HEENT: atraumatic, conjunttiva clear, no obvious abnormalities on inspection of external nose and ears  NECK: normal movements of the head and neck  LUNGS: on inspection no signs of respiratory distress, breathing rate appears normal, no obvious gross SOB, gasping or  wheezing  CV: no obvious cyanosis  MS: moves all visible extremities without noticeable abnormality  PSYCH/NEURO: pleasant and cooperative, no obvious depression or anxiety, speech and thought processing grossly intact  ASSESSMENT AND PLAN:   Hx of adenomatous colonic polyps She is due for 5 yr follow up colonoscopy.  Referral to [facility] requested   Insomnia due to stress She is requesting a refill of the valium that was prescribed last year for insomnia cuased by travel, The risks and benefits of benzodiazepine use were discussed with patient today including excessive sedation leading to respiratory depression,  impaired thinking/driving, and addiction.  Patient was advised to avoid concurrent use with alcohol, to use medication only as needed and not to share with others  .   Multiple pulmonary nodules determined by computed tomography of lung Repeat CT was recommended in 6 to 12 months.  Repeat was not mentioned by Dr Raul Del during her last visit for OSA management.  CT ordered today   CAD (coronary artery disease) With stable angina occurring 2-3 times per week during exertional activities. Medical management per Dr Edwin Dada,  But she has not discussed the results of the cardiac MRI with him, only with his PA . Inducible ischemia was noted in the region supplied by the proximal RCA   Hyperlipidemia associated with type 2 diabetes mellitus ([place]) LDL is now at goal on PCKS9 Praluent started by Dr  Edwin Dada  Lab Results  Component Value Date   ALT 29 03/29/2019   AST 21 03/29/2019   ALKPHOS 51 03/29/2019   BILITOT 0.6 07/27/2018   Lab Results  Component Value Date   CHOL 128 07/27/2019   HDL 43 07/27/2019   LDLCALC 59 07/27/2019   LDLDIRECT 123.0 01/20/2018   TRIG 128 07/27/2019   CHOLHDL 5 07/27/2018     Diabetes mellitus without complication  well-controlled on carbohydrate restricted diet.   hemoglobin A1c has been consistently at or  less than 7.0 . Patient is not up-to-date on eye exams and foot exam is normal today. Patient is due for urine microalbumin to creatinine ratio. Patient is intolerant of  statin therapy for CAD risk reduction but it tolerating a PCSK9 .  She is taking  ACE/ARB for reduction in proteinuria.    Lab Results  Component Value Date   HGBA1C 6.6 (H) 07/27/2018   Lab Results  Component Value Date   CHOL 128 07/27/2019   HDL 43 07/27/2019   LDLCALC 59 07/27/2019   LDLDIRECT 123.0 01/20/2018   TRIG 128 07/27/2019   CHOLHDL 5 07/27/2018   Lab Results  Component Value Date   MICROALBUR <0.7 01/20/2018        I discussed the assessment and treatment plan with the patient. The patient was provided an opportunity to ask questions and all were answered. The patient agreed with the plan and demonstrated an understanding of the instructions.   The patient was advised to call back or seek an in-person evaluation if the symptoms worsen or if the condition fails to improve as anticipated.   A  total of 40 minutes was spent in non face to face time with patient more than half of which was spent in counseling patient on the above mentioned issues , reviewing and explaining recent labs and imaging studies done, and coordination of care.     Crecencio Mc, MD

## 2019-07-28 NOTE — Patient Instructions (Addendum)
  Ms. Stacie Mcdonald , Thank you for taking time to come for your Medicare Wellness Visit. I appreciate your ongoing commitment to your health goals. Please review the following plan we discussed and let me know if I can assist you in the future.   These are the goals we discussed: Goals    . Increase lean proteins     Low carb foods    . Maintain Healthy Lifestyle       This is a list of the screening recommended for you and due dates:  Health Maintenance  Topic Date Due  . Complete foot exam   09/10/2017  . Eye exam for diabetics  06/17/2018  . Hemoglobin A1C  01/25/2019  . Tetanus Vaccine  03/13/2020  . Mammogram  09/08/2020  . Colon Cancer Screening  04/12/2024  . Flu Shot  Completed  . DEXA scan (bone density measurement)  Completed  .  Hepatitis C: One time screening is recommended by Center for Disease Control  (CDC) for  adults born from [birth year] through 1966.   Completed  . Pneumonia vaccines  Completed

## 2019-07-30 MED ORDER — SPIRONOLACTONE 25 MG PO TABS: 25.0000 mg | ORAL_TABLET | Freq: Every day | ORAL | 0 refills | Status: DC

## 2019-07-30 NOTE — Assessment & Plan Note (Signed)
She is requesting a refill of the valium that was prescribed last year for insomnia cuased by travel, The risks and benefits of benzodiazepine use were discussed with patient today including excessive sedation leading to respiratory depression,  impaired thinking/driving, and addiction.  Patient was advised to avoid concurrent use with alcohol, to use medication only as needed and not to share with others  .

## 2019-07-30 NOTE — Assessment & Plan Note (Addendum)
With stable angina occurring 2-3 times per week during exertional activities. Medical management per Dr Edwin Dada,  But she has not discussed the results of the cardiac MRI with him, only with his PA . Inducible ischemia was noted in the region supplied by the proximal RCA

## 2019-07-30 NOTE — Assessment & Plan Note (Signed)
Repeat CT was recommended in 6 to 12 months.  Repeat was not mentioned by Dr Raul Del during her last visit for OSA management.  CT ordered today

## 2019-07-30 NOTE — Assessment & Plan Note (Signed)
She is due for 5 yr follow up colonoscopy.  Referral to [facility] requested

## 2019-07-30 NOTE — Assessment & Plan Note (Signed)
well-controlled on carbohydrate restricted diet.   hemoglobin A1c has been consistently at or  less than 7.0 . Patient is not up-to-date on eye exams and foot exam is normal today. Patient is due for urine microalbumin to creatinine ratio. Patient is intolerant of  statin therapy for CAD risk reduction but it tolerating a PCSK9 .  She is taking  ACE/ARB for reduction in proteinuria.    Lab Results  Component Value Date   HGBA1C 6.6 (H) 07/27/2018   Lab Results  Component Value Date   CHOL 128 07/27/2019   HDL 43 07/27/2019   LDLCALC 59 07/27/2019   LDLDIRECT 123.0 01/20/2018   TRIG 128 07/27/2019   CHOLHDL 5 07/27/2018   Lab Results  Component Value Date   MICROALBUR <0.7 01/20/2018

## 2019-07-30 NOTE — Assessment & Plan Note (Signed)
LDL is now at goal on PCKS9 Praluent started by Dr Edwin Dada  Lab Results  Component Value Date   ALT 29 03/29/2019   AST 21 03/29/2019   ALKPHOS 51 03/29/2019   BILITOT 0.6 07/27/2018   Lab Results  Component Value Date   CHOL 128 07/27/2019   HDL 43 07/27/2019   LDLCALC 59 07/27/2019   LDLDIRECT 123.0 01/20/2018   TRIG 128 07/27/2019   CHOLHDL 5 07/27/2018

## 2019-07-31 ENCOUNTER — Other Ambulatory Visit: Payer: Medicare Other

## 2019-08-02 ENCOUNTER — Other Ambulatory Visit: Payer: Self-pay

## 2019-08-02 ENCOUNTER — Telehealth: Payer: Self-pay

## 2019-08-02 ENCOUNTER — Other Ambulatory Visit (INDEPENDENT_AMBULATORY_CARE_PROVIDER_SITE_OTHER): Payer: Medicare Other

## 2019-08-02 DIAGNOSIS — Z800 Family history of malignant neoplasm of digestive organs: Secondary | ICD-10-CM

## 2019-08-02 DIAGNOSIS — E538 Deficiency of other specified B group vitamins: Secondary | ICD-10-CM | POA: Diagnosis not present

## 2019-08-02 DIAGNOSIS — E119 Type 2 diabetes mellitus without complications: Secondary | ICD-10-CM | POA: Diagnosis not present

## 2019-08-02 DIAGNOSIS — Z86010 Personal history of colonic polyps: Secondary | ICD-10-CM

## 2019-08-02 DIAGNOSIS — R5383 Other fatigue: Secondary | ICD-10-CM | POA: Diagnosis not present

## 2019-08-02 DIAGNOSIS — I10 Essential (primary) hypertension: Secondary | ICD-10-CM | POA: Diagnosis not present

## 2019-08-02 DIAGNOSIS — Z1211 Encounter for screening for malignant neoplasm of colon: Secondary | ICD-10-CM

## 2019-08-02 MED ORDER — NA SULFATE-K SULFATE-MG SULF 17.5-3.13-1.6 GM/177ML PO SOLN: 1.0000 | Freq: Once | ORAL | 0 refills | Status: AC

## 2019-08-02 NOTE — Telephone Encounter (Signed)
Gastroenterology Pre-Procedure Review  Request Date: 08/18/19 Requesting Physician: Dr. Allen Norris  PATIENT REVIEW QUESTIONS: The patient responded to the following health history questions as indicated:    1. Are you having any GI issues? no 2. Do you have a personal history of Polyps? yes (2015 with Dr. Gustavo Lah) 3. Do you have a family history of Colon Cancer or Polyps? yes (some aunts had colon cancer) 4. Diabetes Mellitus? yes (patient states controlled with diet.) 5. Joint replacements in the past 12 months?no 6. Major health problems in the past 3 months?no 7. Any artificial heart valves, MVP, or defibrillator?Patient does have 5 stints in place.  Cardiac clearance to be sent to Dr. Marianna Payment.    MEDICATIONS & ALLERGIES:    Patient reports the following regarding taking any anticoagulation/antiplatelet therapy:   Plavix, Coumadin, Eliquis, Xarelto, Lovenox, Pradaxa, Brilinta, or Effient? yes (Plavix-Blood thinner request sent to Dr. Marianna Payment) Aspirin? yes (81 mg daily)  Patient confirms/reports the following medications:  Current Outpatient Medications  Medication Sig Dispense Refill  . aspirin 81 MG tablet Take 81 mg by mouth daily.     . clopidogrel (PLAVIX) 75 MG tablet TAKE ONE TABLET BY MOUTH DAILY 90 tablet 1  . diazepam (VALIUM) 5 MG tablet Take 1 tablet (5 mg total) by mouth every 12 (twelve) hours as needed for up to 10 days for anxiety. 30 tablet 1  . GLUCOSAMINE-CHONDROITIN PO Take 2 tablets by mouth daily.    . isosorbide mononitrate (IMDUR) 60 MG 24 hr tablet Take 120 mg by mouth daily.     . Lancets (FREESTYLE) lancets uSE TO CHECK SUGARS ONCE DAILY e11.9 100 each 3  . loratadine (CLARITIN) 10 MG tablet Take 10 mg by mouth daily as needed for allergies.    Marland Kitchen losartan (COZAAR) 100 MG tablet TAKE 1 TABLET (100 MG TOTAL) BY MOUTH DAILY. 90 tablet 1  . metoprolol (TOPROL-XL) 200 MG 24 hr tablet TAKE 1 TABLET BY MOUTH DAILY. 90 tablet 1  . niacin 500 MG tablet Take 1 tablet  (500 mg total) by mouth daily. (Patient not taking: Reported on 07/28/2019) 90 tablet 1  . omeprazole (PRILOSEC) 20 MG capsule Take 1 capsule (20 mg total) by mouth daily. (Patient taking differently: Take 20 mg by mouth daily as needed (acid reflux). ) 90 capsule 1  . ONE TOUCH ULTRA TEST test strip CHECK DAILY 100 each 2  . PRALUENT 75 MG/ML SOAJ     . spironolactone (ALDACTONE) 25 MG tablet Take 1 tablet (25 mg total) by mouth daily. 90 tablet 0   No current facility-administered medications for this visit.     Patient confirms/reports the following allergies:  Allergies  Allergen Reactions  . Penicillins Hives    Has patient had a PCN reaction causing immediate rash, facial/tongue/throat swelling, SOB or lightheadedness with hypotension: No Has patient had a PCN reaction causing severe rash involving mucus membranes or skin necrosis: Yes Has patient had a PCN reaction that required hospitalization: No Has patient had a PCN reaction occurring within the last 10 years: Yes If all of the above answers are "NO", then may proceed with Cephalosporin use.   . Statins     Other reaction(s): Muscle Pain    No orders of the defined types were placed in this encounter.   AUTHORIZATION INFORMATION Primary Insurance: 1D#: Group #:  Secondary Insurance: 1D#: Group #:  SCHEDULE INFORMATION: Date: 08/18/19 Time: Location:[facility]

## 2019-08-03 LAB — HEMOGLOBIN A1C: Hgb A1c MFr Bld: 6.4 % (ref 4.6–6.5)

## 2019-08-03 LAB — BASIC METABOLIC PANEL
BUN: 24 mg/dL — ABNORMAL HIGH (ref 6–23)
CO2: 22 mEq/L (ref 19–32)
Calcium: 9.8 mg/dL (ref 8.4–10.5)
Chloride: 103 mEq/L (ref 96–112)
Creatinine, Ser: 0.9 mg/dL (ref 0.40–1.20)
GFR: 61.11 mL/min (ref >60.00)
Glucose, Bld: 93 mg/dL (ref 70–99)
Potassium: 4 mEq/L (ref 3.5–5.1)
Sodium: 139 mEq/L (ref 135–145)

## 2019-08-03 LAB — MICROALBUMIN / CREATININE URINE RATIO
Creatinine,U: 31.9 mg/dL
Microalb Creat Ratio: 2.2 mg/g (ref 0.0–30.0)
Microalb, Ur: <0.7 mg/dL (ref 0.0–1.9)

## 2019-08-03 LAB — VITAMIN B12: Vitamin B-12: >1500 pg/mL — ABNORMAL HIGH (ref 211–911)

## 2019-08-03 LAB — TSH: TSH: 2.03 u[IU]/mL (ref 0.35–4.50)

## 2019-08-10 ENCOUNTER — Encounter: Payer: Self-pay | Admitting: *Deleted

## 2019-08-10 ENCOUNTER — Other Ambulatory Visit: Payer: Self-pay

## 2019-08-11 NOTE — Anesthesia Preprocedure Evaluation (Addendum)
Anesthesia Evaluation  Patient identified by MRN, date of birth, ID band Patient awake    Reviewed: Allergy & Precautions, NPO status , Patient's Chart, lab work & pertinent test results  History of Anesthesia Complications Negative for: history of anesthetic complications  Airway Mallampati: IV   Neck ROM: Full    Dental  (+)    Pulmonary asthma , sleep apnea and Continuous Positive Airway Pressure Ventilation ,    Pulmonary exam normal breath sounds clear to auscultation       Cardiovascular hypertension, + angina + CAD (s/p MI and stents on Plavix, last dose 08/11/19) and +CHF  Normal cardiovascular exam Rhythm:Regular Rate:Normal     Neuro/Psych negative neurological ROS     GI/Hepatic GERD  ,  Endo/Other  Prediabetes   Renal/GU negative Renal ROS     Musculoskeletal  (+) Arthritis ,   Abdominal   Peds  Hematology Breast CA   Anesthesia Other Findings Cardiology note 07/27/19:  ASSESSMENT AND PLAN  Stacie Mcdonald is a 73 y.o. female who is seen for below problems:  1. Chronic systolic congestive heart failure (CMS-HCC)  2. Coronary artery disease of native artery of native heart with stable angina pectoris (CMS-HCC)  3. Essential hypertension  4. OSA on CPAP  5. Mixed hyperlipidemia  6. Need for immunization against influenza   Heart Failure Recovered Ejection Fraction (HFrEF) EF 47% on cardiac MRI. Class II symptoms primarily DOE. Euvolemic on exam.  -Continue GDMT: with BB, ARB, MRA (increasing dose see below), and furosemide 40mg  daily  Coronary Artery Disease Cardiac stress MRI with peri-infarct ischemia in RCA territory. Her angina is stable and she is managing well, resolves with rest. She is on good antianginal therapy. Will discuss with Dr. Edwin Dada but plan will likely be to continue medical therapy and defer LHC for now.  -Continue GDMT: ASA 81mg  daily, clopidogrel 75mg  daily, Pralulent 75mg  every 14  days, IMDUR 120mg  daily, metoprolol XL 200mg  daily, losartan 100mg  daily, ranolazine 500mg  twice daily, spironolactone 25mg  daily  Hypertension BP elevated. Increase spironolactone to 25mg  daily. Repeat BMP in 1 week (lab order provided and she will obtain this at [facility]).  -Continue Metoprolol XL 200mg  daily, losartan 100mg  daily, spironolactone 25mg  daily, furosemide 40mg  daily.   Hyperlipidemia Previously intolerant of statins. Tolerating Pralulent well with significant improvement in LDL cholesterol from 134 to 59.  Obstructive Sleep Apnea  Continues to wear CPAP.   DISPOSITION  Return in about 4 years (around 07/27/2023) for Dr. Edwin Dada .   Reproductive/Obstetrics                           Anesthesia Physical Anesthesia Plan  ASA: III  Anesthesia Plan: General   Post-op Pain Management:    Induction: Intravenous  PONV Risk Score and Plan: 3 and Propofol infusion and TIVA  Airway Management Planned: Natural Airway  Additional Equipment:   Intra-op Plan:   Post-operative Plan:   Informed Consent: I have reviewed the patients History and Physical, chart, labs and discussed the procedure including the risks, benefits and alternatives for the proposed anesthesia with the patient or authorized representative who has indicated his/her understanding and acceptance.       Plan Discussed with: CRNA  Anesthesia Plan Comments: (Cardiology clearance dated 08/15/19 in paper chart.)     Anesthesia Quick Evaluation

## 2019-08-14 ENCOUNTER — Other Ambulatory Visit: Payer: Self-pay

## 2019-08-14 ENCOUNTER — Ambulatory Visit
Admission: RE | Admit: 2019-08-14 | Discharge: 2019-08-14 | Disposition: A | Discharge status: Home / Self Care | Payer: Medicare Other | Source: Ambulatory Visit | Attending: Internal Medicine | Admitting: Internal Medicine

## 2019-08-14 DIAGNOSIS — R918 Other nonspecific abnormal finding of lung field: Secondary | ICD-10-CM

## 2019-08-15 ENCOUNTER — Other Ambulatory Visit
Admission: RE | Admit: 2019-08-15 | Discharge: 2019-08-15 | Disposition: A | Discharge status: Home / Self Care | Payer: Medicare Other | Source: Ambulatory Visit | Attending: Gastroenterology | Admitting: Gastroenterology

## 2019-08-15 DIAGNOSIS — Z20828 Contact with and (suspected) exposure to other viral communicable diseases: Secondary | ICD-10-CM | POA: Insufficient documentation

## 2019-08-15 DIAGNOSIS — Z01812 Encounter for preprocedural laboratory examination: Secondary | ICD-10-CM | POA: Diagnosis not present

## 2019-08-15 LAB — SARS CORONAVIRUS 2 (TAT 6-24 HRS): SARS Coronavirus 2: NEGATIVE

## 2019-08-16 ENCOUNTER — Other Ambulatory Visit: Payer: Self-pay | Admitting: Internal Medicine

## 2019-08-16 DIAGNOSIS — J849 Interstitial pulmonary disease, unspecified: Secondary | ICD-10-CM

## 2019-08-17 NOTE — Discharge Instructions (Signed)

## 2019-08-18 ENCOUNTER — Ambulatory Visit: Payer: Medicare Other | Admitting: Anesthesiology

## 2019-08-18 ENCOUNTER — Encounter
Admission: RE | Disposition: A | Discharge status: Home / Self Care | Payer: Self-pay | Source: Home / Self Care | Attending: Gastroenterology

## 2019-08-18 ENCOUNTER — Other Ambulatory Visit: Payer: Self-pay

## 2019-08-18 ENCOUNTER — Ambulatory Visit
Admission: RE | Admit: 2019-08-18 | Discharge: 2019-08-18 | Disposition: A | Discharge status: Home / Self Care | Payer: Medicare Other | Attending: Gastroenterology | Admitting: Gastroenterology

## 2019-08-18 DIAGNOSIS — Z7982 Long term (current) use of aspirin: Secondary | ICD-10-CM | POA: Insufficient documentation

## 2019-08-18 DIAGNOSIS — Z9011 Acquired absence of right breast and nipple: Secondary | ICD-10-CM | POA: Insufficient documentation

## 2019-08-18 DIAGNOSIS — G4733 Obstructive sleep apnea (adult) (pediatric): Secondary | ICD-10-CM | POA: Diagnosis not present

## 2019-08-18 DIAGNOSIS — Z955 Presence of coronary angioplasty implant and graft: Secondary | ICD-10-CM | POA: Insufficient documentation

## 2019-08-18 DIAGNOSIS — I110 Hypertensive heart disease with heart failure: Secondary | ICD-10-CM | POA: Diagnosis not present

## 2019-08-18 DIAGNOSIS — I252 Old myocardial infarction: Secondary | ICD-10-CM | POA: Insufficient documentation

## 2019-08-18 DIAGNOSIS — I5022 Chronic systolic (congestive) heart failure: Secondary | ICD-10-CM | POA: Insufficient documentation

## 2019-08-18 DIAGNOSIS — I2510 Atherosclerotic heart disease of native coronary artery without angina pectoris: Secondary | ICD-10-CM | POA: Diagnosis not present

## 2019-08-18 DIAGNOSIS — D122 Benign neoplasm of ascending colon: Secondary | ICD-10-CM | POA: Diagnosis not present

## 2019-08-18 DIAGNOSIS — M1990 Unspecified osteoarthritis, unspecified site: Secondary | ICD-10-CM | POA: Insufficient documentation

## 2019-08-18 DIAGNOSIS — Z853 Personal history of malignant neoplasm of breast: Secondary | ICD-10-CM | POA: Diagnosis not present

## 2019-08-18 DIAGNOSIS — D124 Benign neoplasm of descending colon: Secondary | ICD-10-CM | POA: Insufficient documentation

## 2019-08-18 DIAGNOSIS — K635 Polyp of colon: Secondary | ICD-10-CM | POA: Diagnosis not present

## 2019-08-18 DIAGNOSIS — K219 Gastro-esophageal reflux disease without esophagitis: Secondary | ICD-10-CM | POA: Insufficient documentation

## 2019-08-18 DIAGNOSIS — Z79899 Other long term (current) drug therapy: Secondary | ICD-10-CM | POA: Diagnosis not present

## 2019-08-18 DIAGNOSIS — Z1211 Encounter for screening for malignant neoplasm of colon: Secondary | ICD-10-CM | POA: Diagnosis not present

## 2019-08-18 DIAGNOSIS — Z7902 Long term (current) use of antithrombotics/antiplatelets: Secondary | ICD-10-CM | POA: Insufficient documentation

## 2019-08-18 DIAGNOSIS — K5730 Diverticulosis of large intestine without perforation or abscess without bleeding: Secondary | ICD-10-CM | POA: Insufficient documentation

## 2019-08-18 DIAGNOSIS — Z860100 Personal history of colon polyps, unspecified: Secondary | ICD-10-CM

## 2019-08-18 HISTORY — DX: Sleep apnea, unspecified: G47.30

## 2019-08-18 HISTORY — DX: Presence of spectacles and contact lenses: Z97.3

## 2019-08-18 HISTORY — PX: COLONOSCOPY WITH PROPOFOL: SHX5780

## 2019-08-18 HISTORY — PX: POLYPECTOMY: SHX5525

## 2019-08-18 HISTORY — DX: Acute myocardial infarction, unspecified: I21.9

## 2019-08-18 SURGERY — COLONOSCOPY WITH PROPOFOL
Anesthesia: General | Site: Rectum

## 2019-08-18 MED ORDER — ONDANSETRON HCL 4 MG/2ML IJ SOLN: 4.0000 mg | Freq: Once | INTRAMUSCULAR | Status: DC | PRN

## 2019-08-18 MED ORDER — ACETAMINOPHEN 160 MG/5ML PO SOLN: 325.0000 mg | ORAL | Status: DC | PRN

## 2019-08-18 MED ORDER — LACTATED RINGERS IV SOLN
10.0000 mL/h | INTRAVENOUS | Status: DC
  Administered 2019-08-18: 10 mL/h via INTRAVENOUS

## 2019-08-18 MED ORDER — STERILE WATER FOR IRRIGATION IR SOLN
Status: DC | PRN
  Administered 2019-08-18: 11:00:00 .05 mL

## 2019-08-18 MED ORDER — ACETAMINOPHEN 325 MG PO TABS: 650.0000 mg | ORAL_TABLET | Freq: Once | ORAL | Status: DC | PRN

## 2019-08-18 MED ORDER — PROPOFOL 10 MG/ML IV BOLUS
INTRAVENOUS | Status: DC | PRN
  Administered 2019-08-18: 70 mg via INTRAVENOUS
  Administered 2019-08-18: 30 mg via INTRAVENOUS
  Administered 2019-08-18: 40 mg via INTRAVENOUS

## 2019-08-18 MED ORDER — LIDOCAINE HCL (CARDIAC) PF 100 MG/5ML IV SOSY
PREFILLED_SYRINGE | INTRAVENOUS | Status: DC | PRN
  Administered 2019-08-18: 40 mg via INTRAVENOUS

## 2019-08-18 SURGICAL SUPPLY — 6 items
CANISTER SUCT 1200ML W/VALVE (MISCELLANEOUS) ×4 IMPLANT
FORCEPS BIOP RAD 4 LRG CAP 4 (CUTTING FORCEPS) ×2 IMPLANT
GOWN CVR UNV OPN BCK APRN NK (MISCELLANEOUS) ×4 IMPLANT
GOWN ISOL THUMB LOOP REG UNIV (MISCELLANEOUS) ×8
KIT ENDO PROCEDURE OLY (KITS) ×4 IMPLANT
WATER STERILE IRR 250ML POUR (IV SOLUTION) ×4 IMPLANT

## 2019-08-18 NOTE — Anesthesia Postprocedure Evaluation (Signed)
Anesthesia Post Note  Patient: Stacie Mcdonald  Procedure(s) Performed: COLONOSCOPY WITH PROPOFOL (N/A Rectum) POLYPECTOMY (Rectum)  Patient location during evaluation: PACU Anesthesia Type: General Level of consciousness: awake and alert, oriented and patient cooperative Pain management: pain level controlled Vital Signs Assessment: post-procedure vital signs reviewed and stable Respiratory status: spontaneous breathing, nonlabored ventilation and respiratory function stable Cardiovascular status: blood pressure returned to baseline and stable Postop Assessment: adequate PO intake Anesthetic complications: no    Darrin Nipper

## 2019-08-18 NOTE — Transfer of Care (Signed)
Immediate Anesthesia Transfer of Care Note  Patient: Stacie Mcdonald  Procedure(s) Performed: COLONOSCOPY WITH PROPOFOL (N/A Rectum) POLYPECTOMY (Rectum)  Patient Location: PACU  Anesthesia Type: General  Level of Consciousness: awake, alert  and patient cooperative  Airway and Oxygen Therapy: Patient Spontanous Breathing and Patient connected to supplemental oxygen  Post-op Assessment: Post-op Vital signs reviewed, Patient's Cardiovascular Status Stable, Respiratory Function Stable, Patent Airway and No signs of Nausea or vomiting  Post-op Vital Signs: Reviewed and stable  Complications: No apparent anesthesia complications

## 2019-08-18 NOTE — Op Note (Signed)
[facility] Gastroenterology Patient Name: Stacie Mcdonald Procedure Date: 08/18/2019 10:36 AM MRN: [id number] Account #: [id number] Date of Birth: [birth year] Admit Type: Outpatient Age: 73 Room: [facility] OR ROOM 01 Gender: Female Note Status: Finalized Procedure:            Colonoscopy Indications:          High risk colon cancer surveillance: Personal history                        of colonic polyps Providers:            [name] MD, MD Referring MD:         Deborra Medina, MD (Referring MD) Medicines:            Propofol per Anesthesia Complications:        No immediate complications. Procedure:            Pre-Anesthesia Assessment:                       - Prior to the procedure, a History and Physical was                        performed, and patient medications and allergies were                        reviewed. The patient's tolerance of previous                        anesthesia was also reviewed. The risks and benefits of                        the procedure and the sedation options and risks were                        discussed with the patient. All questions were                        answered, and informed consent was obtained. Prior                        Anticoagulants: The patient has taken no previous                        anticoagulant or antiplatelet agents. ASA Grade                        Assessment: II - A patient with mild systemic disease.                        After reviewing the risks and benefits, the patient was                        deemed in satisfactory condition to undergo the                        procedure.                       After obtaining informed consent, the colonoscope was  passed under direct vision. Throughout the procedure,                        the patient's blood pressure, pulse, and oxygen                        saturations were monitored continuously. The was                        introduced  through the anus and advanced to the the                        cecum, identified by appendiceal orifice and ileocecal                        valve. The colonoscopy was performed without                        difficulty. The patient tolerated the procedure well.                        The quality of the bowel preparation was excellent. Findings:      The perianal and digital rectal examinations were normal.      A 5 mm polyp was found in the sigmoid colon. The polyp was sessile. The       polyp was removed with a cold biopsy forceps. Resection and retrieval       were complete.      A 3 mm polyp was found in the descending colon. The polyp was sessile.       The polyp was removed with a cold biopsy forceps. Resection and       retrieval were complete.      Two sessile polyps were found in the ascending colon. The polyps were 1       to 2 mm in size. These polyps were removed with a cold biopsy forceps.       Resection and retrieval were complete.      A few small-mouthed diverticula were found in the sigmoid colon. Impression:           - One 5 mm polyp in the sigmoid colon, removed with a                        cold biopsy forceps. Resected and retrieved.                       - One 3 mm polyp in the descending colon, removed with                        a cold biopsy forceps. Resected and retrieved.                       - Two 1 to 2 mm polyps in the ascending colon, removed                        with a cold biopsy forceps. Resected and retrieved.                       - Diverticulosis in the sigmoid colon. Recommendation:       -  Discharge patient to home.                       - Resume previous diet.                       - Continue present medications.                       - Await pathology results. Procedure Code(s):    --- Professional ---                       [phone], Colonoscopy, flexible; with biopsy, single or                        multiple Diagnosis Code(s):    ---  Professional ---                       Z86.010, Personal history of colonic polyps                       K63.5, Polyp of colon CPT copyright 2019 American Medical Association. All rights reserved. The codes documented in this report are preliminary and upon coder review may  be revised to meet current compliance requirements. [name] MD, MD 08/18/2019 11:01:27 AM This report has been signed electronically. Number of Addenda: 0 Note Initiated On: 08/18/2019 10:36 AM Scope Withdrawal Time: 0 hours 7 minutes 48 seconds  Total Procedure Duration: 0 hours 15 minutes 35 seconds  Estimated Blood Loss: Estimated blood loss: none.      [facility]

## 2019-08-18 NOTE — H&P (Signed)
[name], MD [address], [place] [address] [phone] Fax : [phone]  Primary Care Physician:  Crecencio Mc, MD Primary Gastroenterologist:  Dr. Allen Norris  Pre-Procedure History & Physical: HPI:  Stacie Mcdonald is a 73 y.o. female is here for an colonoscopy.   Past Medical History:  Diagnosis Date  . Allergy   . Arthritis    fingers, knees  . Asthma   . Breast Cancer 1991   R mastectomy. XRT , chemo  . Breast cancer ([place]) 1991   RT MASTECTOMY  . CAD (coronary artery disease) 2008   50% LAD occlusion 2009,  stent in mid LAD 2008 for 95%   . Chicken pox   . Colon polyps April 2009   by Colonoscopy, Gustavo Lah  . GERD (gastroesophageal reflux disease)   . Heart disease   . Hyperlipidemia   . Hypertension   . Myocardial infarction ([place])    silent. discovered through testing  . 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 Rheumatic fever   . Sleep apnea    CPAP  . Stable angina ([place]) 07/27/2019  . Urinary tract infection   . Wears contact lenses    left only    Past Surgical History:  Procedure Laterality Date  . BREAST BIOPSY     right breast reconstruction S/P mastectomy  . BREAST SURGERY     right mastectomy  . CORONARY ANGIOPLASTY WITH STENT PLACEMENT  Jan 2013   mid RCA 75% occlusion resolved to 0%, [place]  . CORONARY STENT INTERVENTION N/A 04/07/2018   Procedure: CORONARY STENT INTERVENTION;  Surgeon: Yolonda Kida, MD;  [facility]: [facility];  Service: Cardiovascular;  Laterality: N/A;  . MASTECTOMY Right 1991   BREAST CA  . RIGHT/LEFT HEART CATH AND CORONARY ANGIOGRAPHY N/A 04/07/2018   Procedure: RIGHT/LEFT HEART CATH AND CORONARY ANGIOGRAPHY;  Surgeon: Yolonda Kida, MD;  Location: [place] [facility];  Service: Cardiovascular;  Laterality: N/A;  . TONSILLECTOMY      Prior to Admission medications   Medication Sig Start Date End Date Taking? Authorizing Provider  aspirin 81  MG tablet Take 81 mg by mouth daily.    Yes [provider]  clopidogrel (PLAVIX) 75 MG tablet TAKE ONE TABLET BY MOUTH DAILY 12/25/13  Yes Crecencio Mc, MD  furosemide (LASIX) 40 MG tablet Take 40 mg by mouth daily.   Yes [provider]  GLUCOSAMINE-CHONDROITIN PO Take 2 tablets by mouth daily.   Yes [provider]  isosorbide mononitrate (IMDUR) 60 MG 24 hr tablet Take 120 mg by mouth daily.    Yes [provider]  loratadine (CLARITIN) 10 MG tablet Take 10 mg by mouth daily as needed for allergies.   Yes [provider]  losartan (COZAAR) 100 MG tablet TAKE 1 TABLET (100 MG TOTAL) BY MOUTH DAILY. 03/16/14  Yes Crecencio Mc, MD  metoprolol (TOPROL-XL) 200 MG 24 hr tablet TAKE 1 TABLET BY MOUTH DAILY. 08/17/14  Yes Crecencio Mc, MD  nitroGLYCERIN (NITROSTAT) 0.4 MG SL tablet Place 0.4 mg under the tongue every 5 (five) minutes as needed for chest pain.   Yes [provider]  omeprazole (PRILOSEC) 20 MG capsule Take 1 capsule (20 mg total) by mouth daily. Patient taking differently: Take 20 mg by mouth daily as needed (acid reflux).  08/17/14  Yes Crecencio Mc, MD  PRALUENT 75 MG/ML SOAJ every 14 (fourteen) days.  04/11/19  Yes [provider]  ranolazine (RANEXA) 500 MG 12 hr tablet Take 500 mg by mouth 2 (two) times daily.   Yes [provider]  spironolactone (ALDACTONE) 25 MG tablet Take 1 tablet (25 mg total) by mouth daily. 07/30/19  Yes Crecencio Mc, MD  Lancets (FREESTYLE) lancets uSE TO CHECK SUGARS ONCE DAILY e11.9 09/10/16   Crecencio Mc, MD  niacin 500 MG tablet Take 1 tablet (500 mg total) by mouth daily. Patient not taking: Reported on 07/28/2019 03/16/14   Crecencio Mc, MD  ONE [facility] ULTRA TEST test strip CHECK DAILY 03/12/14   Crecencio Mc, MD    Allergies as of 08/02/2019 - Review Complete 07/28/2019  Allergen Reaction Noted  . Penicillins Hives 09/09/2015  . Statins  04/05/2019    Family  History  Problem Relation Age of Onset  . Heart disease Mother 46       massive MI  . Heart disease Father 61       AMI  . Cancer Father        metastatic lung CA  . Diabetes Sister   . Cancer Maternal Aunt   . Breast cancer Maternal Aunt 29  . Breast cancer Cousin        36s  . Breast cancer Cousin        68s    Social History   Socioeconomic History  . Marital status: Married    Spouse name: Blondell Reveal   . Number of children: 4  . Years of education: bachelors  . Highest education level: Not on file  Occupational History  . Occupation: RETIRED  Social Needs  . Financial resource strain: Not hard at all  . Food insecurity    Worry: Never true    Inability: Never true  . Transportation needs    Medical: No    Non-medical: No  Tobacco Use  . Smoking status: Never Smoker  . Smokeless tobacco: Never Used  Substance and Sexual Activity  . Alcohol use: Yes    Alcohol/week: 1.0 standard drinks    Types: 1 Glasses of wine per week    Comment: occasional wine  . Drug use: No  . Sexual activity: Yes  Lifestyle  . Physical activity    Days per week: 3 days    Minutes per session: 30 min  . Stress: Not at all  Relationships  . Social connections    Talks on phone: More than three times a week    Gets together: More than three times a week    Attends religious service: More than 4 times per year    Active member of club or organization: Yes    Attends meetings of clubs or organizations: More than 4 times per year    Relationship status: Married  . Intimate partner violence    Fear of current or ex partner: No    Emotionally abused: No    Physically abused: No    Forced sexual activity: No  Other Topics Concern  . Not on file  Social History Narrative  . Not on file    Review of Systems: See HPI, otherwise negative ROS  Physical Exam: BP (!) 154/83   Pulse 94   Temp (!) 97.2 F (36.2 C) (Temporal)   Resp 16   Ht 5\' 3"  (1.6 m)   Wt 79.4 kg   SpO2 99%    BMI 31.01 kg/m  General:   Alert,  pleasant and cooperative  in NAD Head:  Normocephalic and atraumatic. Neck:  Supple; no masses or thyromegaly. Lungs:  Clear throughout to auscultation.    Heart:  Regular rate and rhythm. Abdomen:  Soft, nontender and nondistended. Normal bowel sounds, without guarding, and without rebound.   Neurologic:  Alert and  oriented x4;  grossly normal neurologically.  Impression/Plan: Stacie Mcdonald is here for an colonoscopy to be performed for colonoscopy 04/13/2015 adenomatous polyp history.  Risks, benefits, limitations, and alternatives regarding  colonoscopy have been reviewed with the patient.  Questions have been answered.  All parties agreeable.   [name], MD  08/18/2019, 9:39 AM

## 2019-08-21 ENCOUNTER — Encounter: Payer: Self-pay | Admitting: Gastroenterology

## 2019-08-22 ENCOUNTER — Encounter: Payer: Self-pay | Admitting: Gastroenterology

## 2019-08-30 ENCOUNTER — Other Ambulatory Visit: Payer: Self-pay | Admitting: Internal Medicine

## 2019-08-30 DIAGNOSIS — Z1231 Encounter for screening mammogram for malignant neoplasm of breast: Secondary | ICD-10-CM

## 2019-09-06 IMAGING — DX DG CERVICAL SPINE COMPLETE 4+V
6 series · 6 of 6 positions shown · non-contrast
Comparison: None.

CLINICAL DATA: Neck pain several weeks.  No injury.

EXAM:
CERVICAL SPINE - COMPLETE 4+ VIEW

[cervical spine ap]
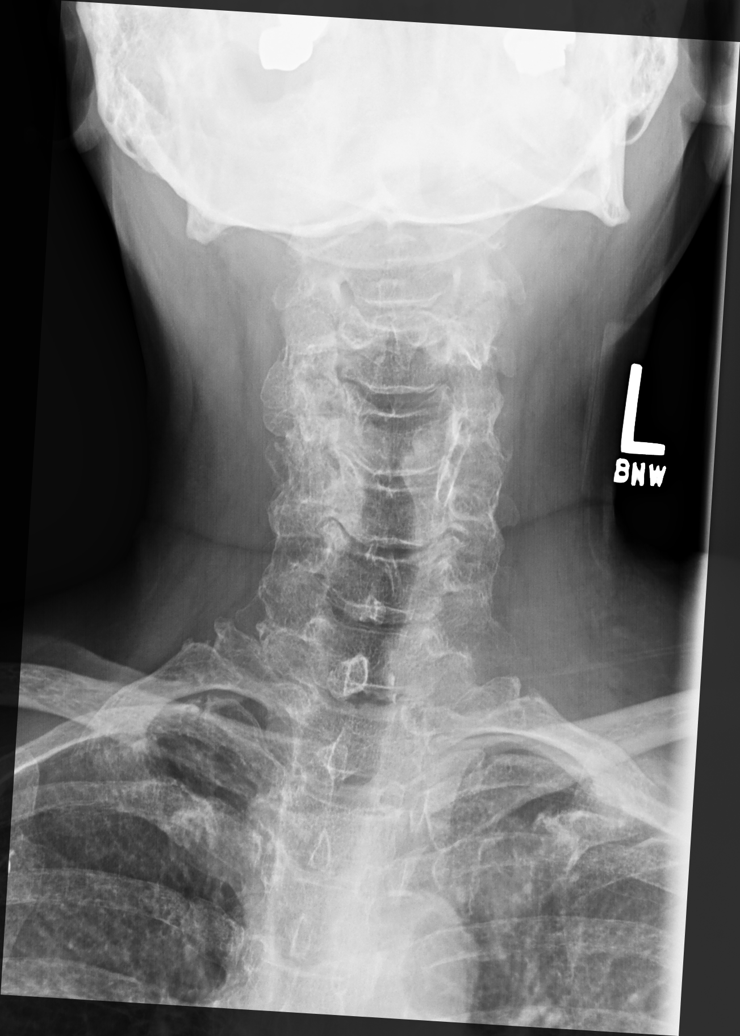

[cervical spine oblique (1 of 2)]
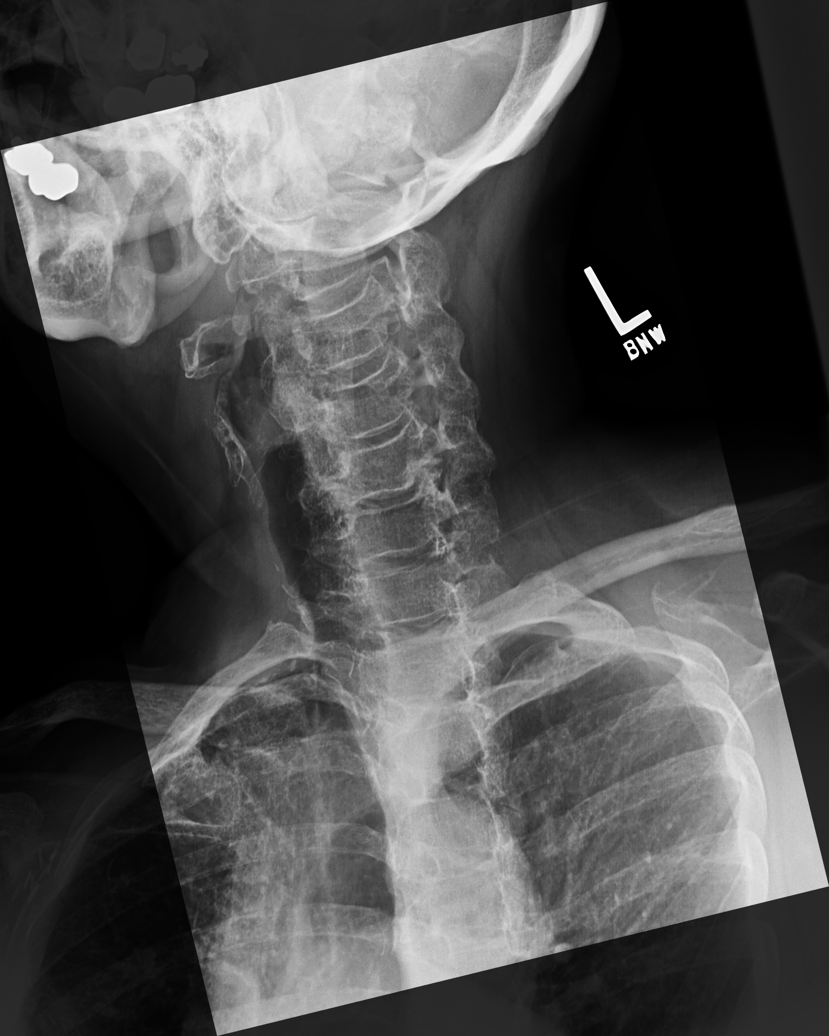

[cervical spine oblique (2 of 2)]
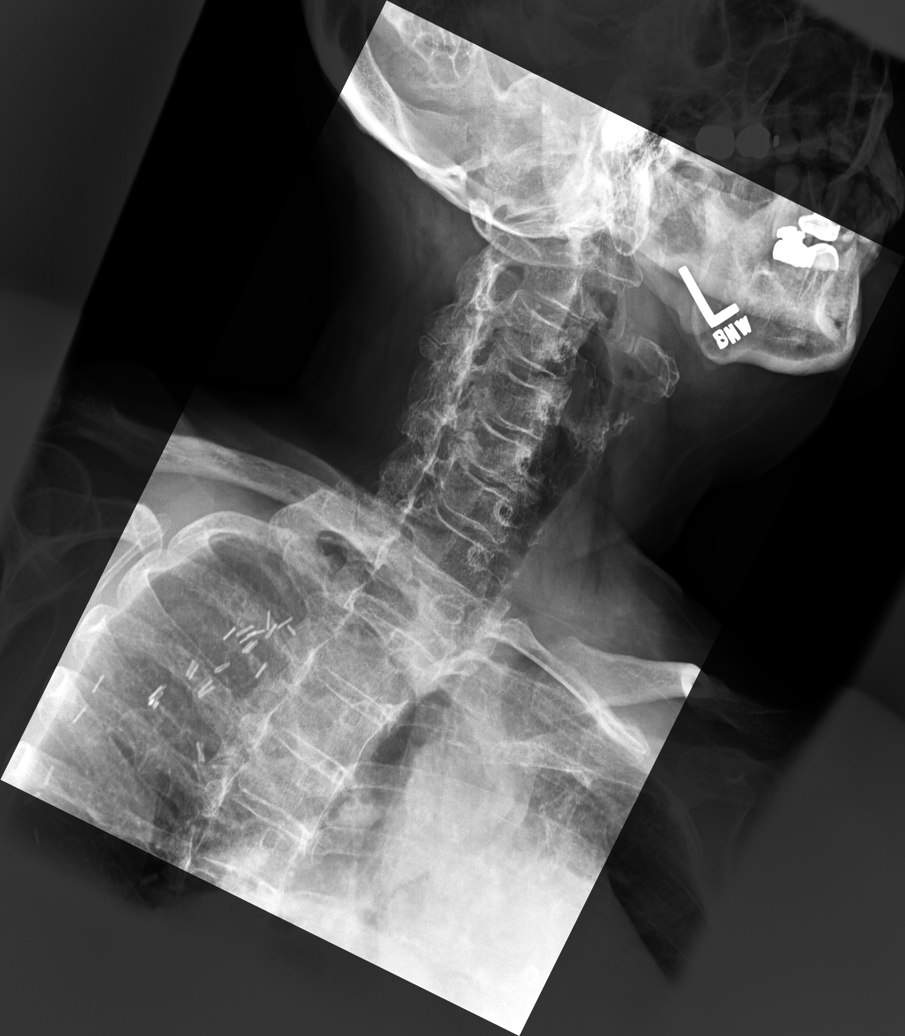

[cervical spine lat]
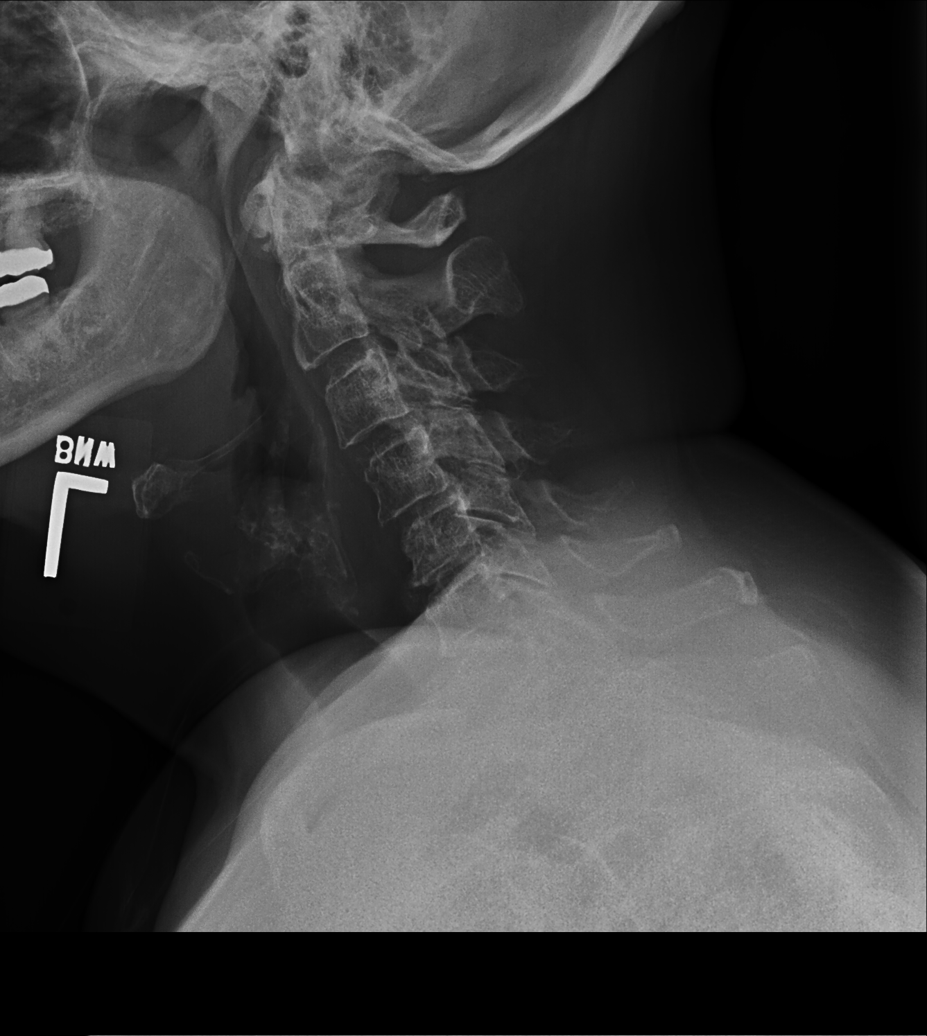

[swimmers lat]
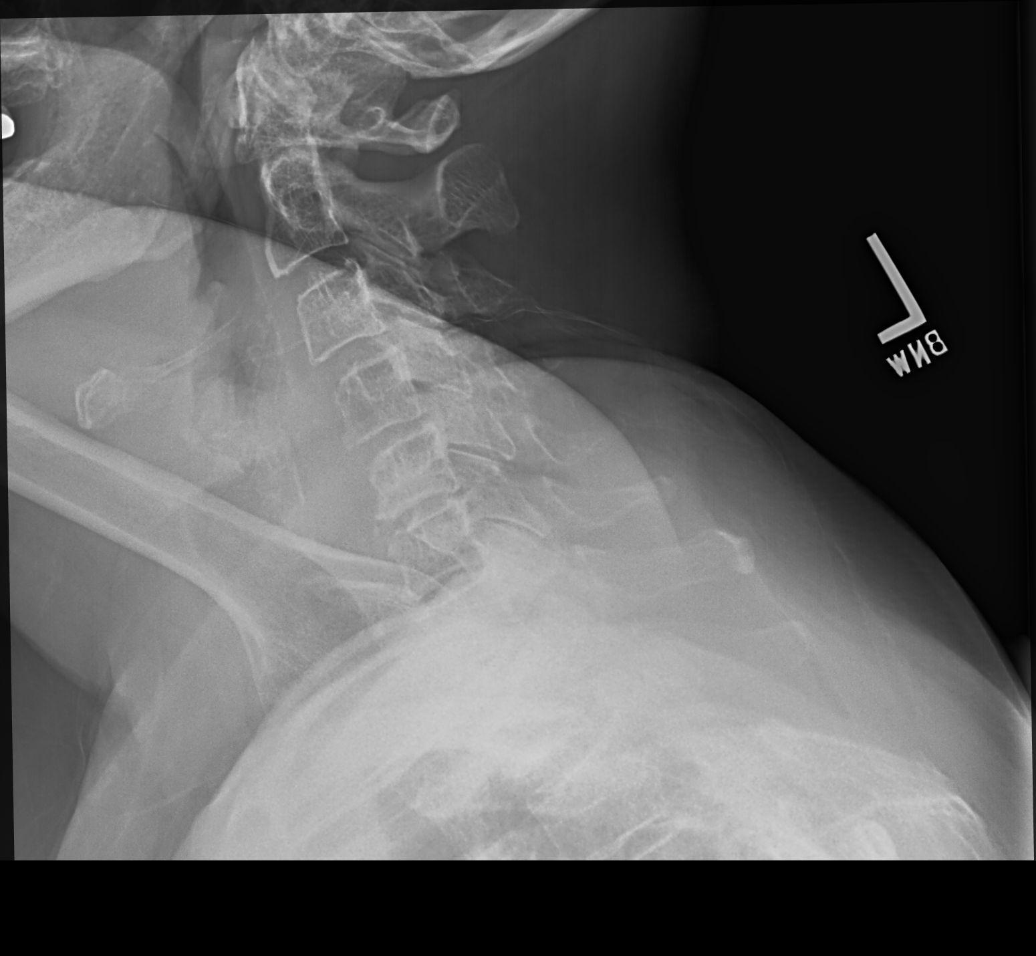

[cervical spine open mouth ap]
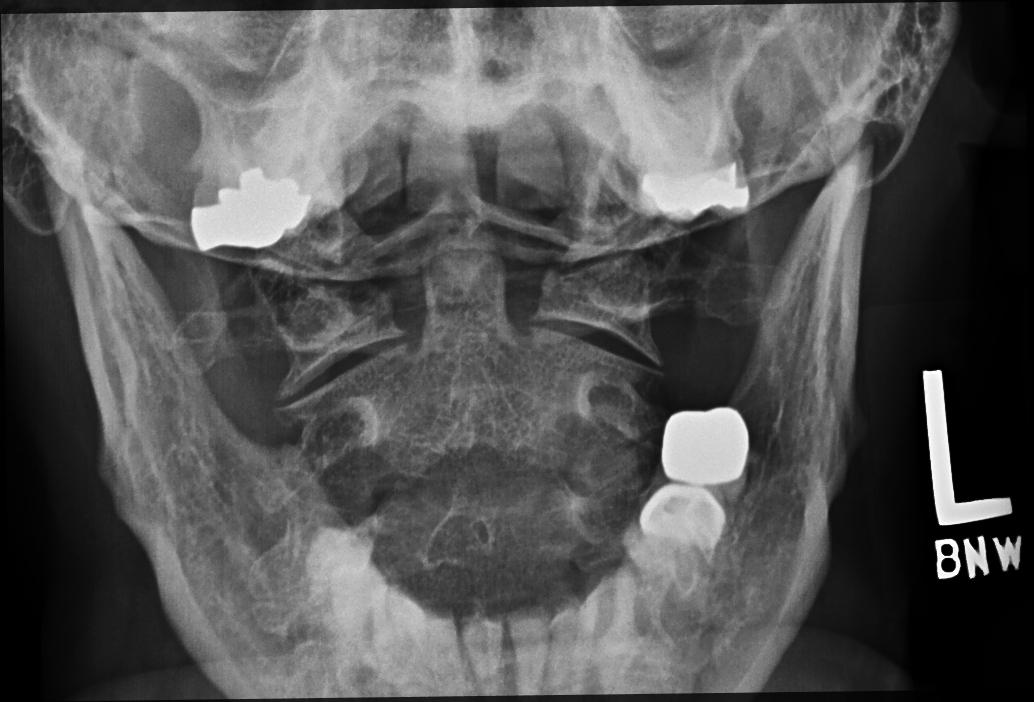

[6 of 6 positions shown; findings below may reference images not displayed]

FINDINGS: Vertebral body alignment, heights and disc space heights are within
normal. There is mild spondylosis throughout the cervical spine.
There is moderate to severe facet arthropathy right greater than
left with moderate uncovertebral joint spurring. There is suboptimal
positioning for proper evaluation of the left neural foramina. There
is moderate right-sided neural foraminal narrowing at the C4-5
level.
IMPRESSION: Mild to moderate spondylosis of the cervical spine. Moderate
right-sided neural foraminal narrowing at the C4-5 level. Suboptimal
positioning to properly evaluate the left neural foramina.

## 2019-09-06 IMAGING — DX DG SHOULDER 2+V*L*
2 series · 2 of 2 positions shown · non-contrast
Comparison: None.

CLINICAL DATA: Left shoulder pain several weeks.  No injury.

EXAM:
LEFT SHOULDER - 2+ VIEW

[shoulder (grashey) ap]
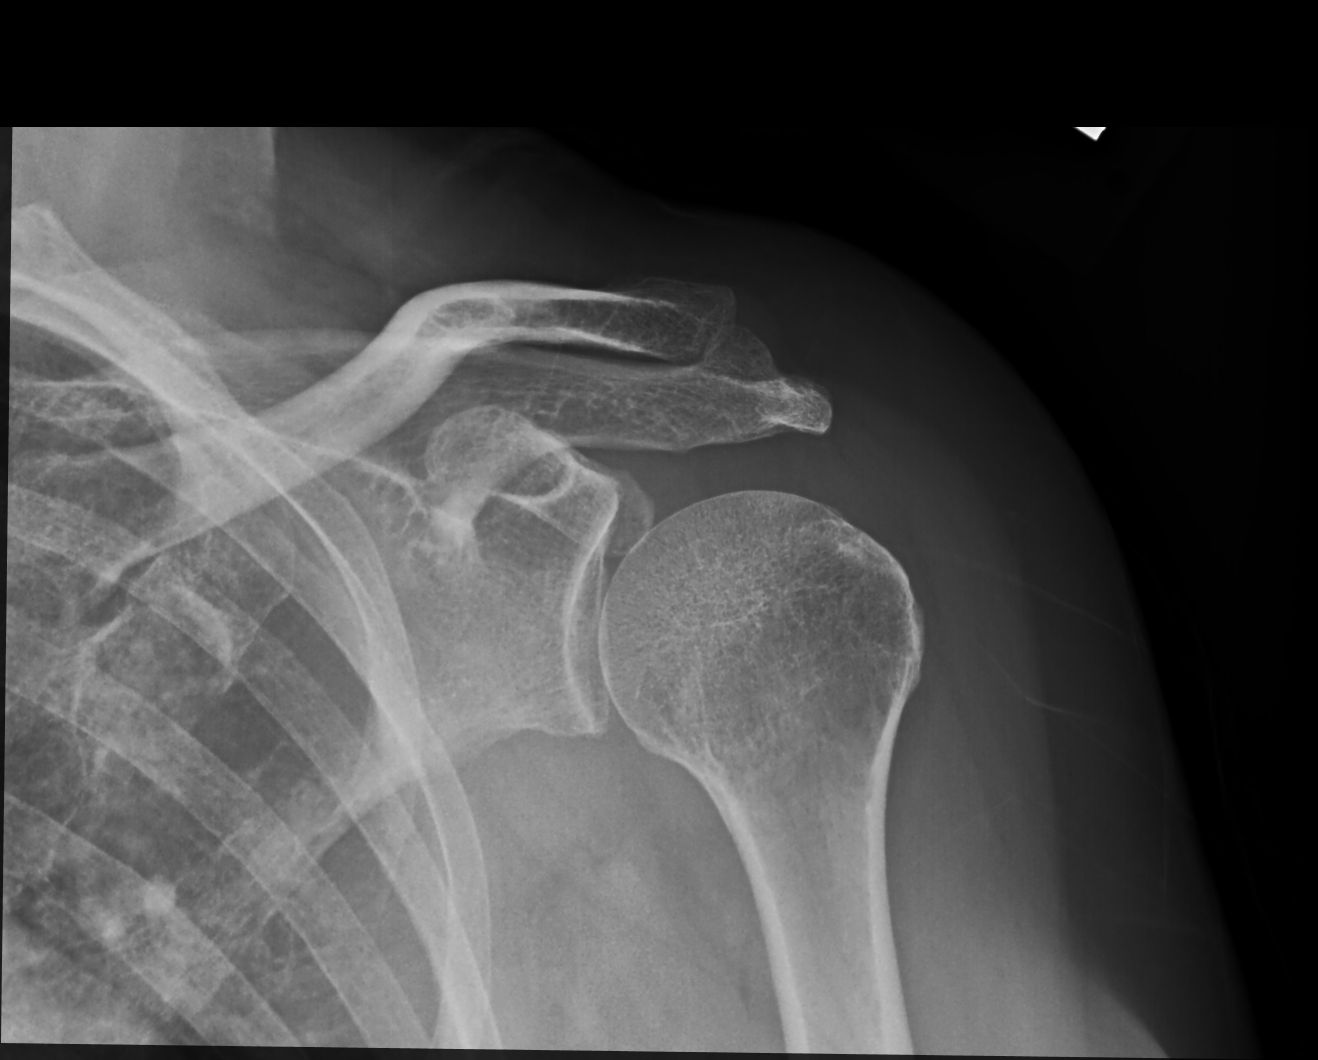

[shoulder y view]
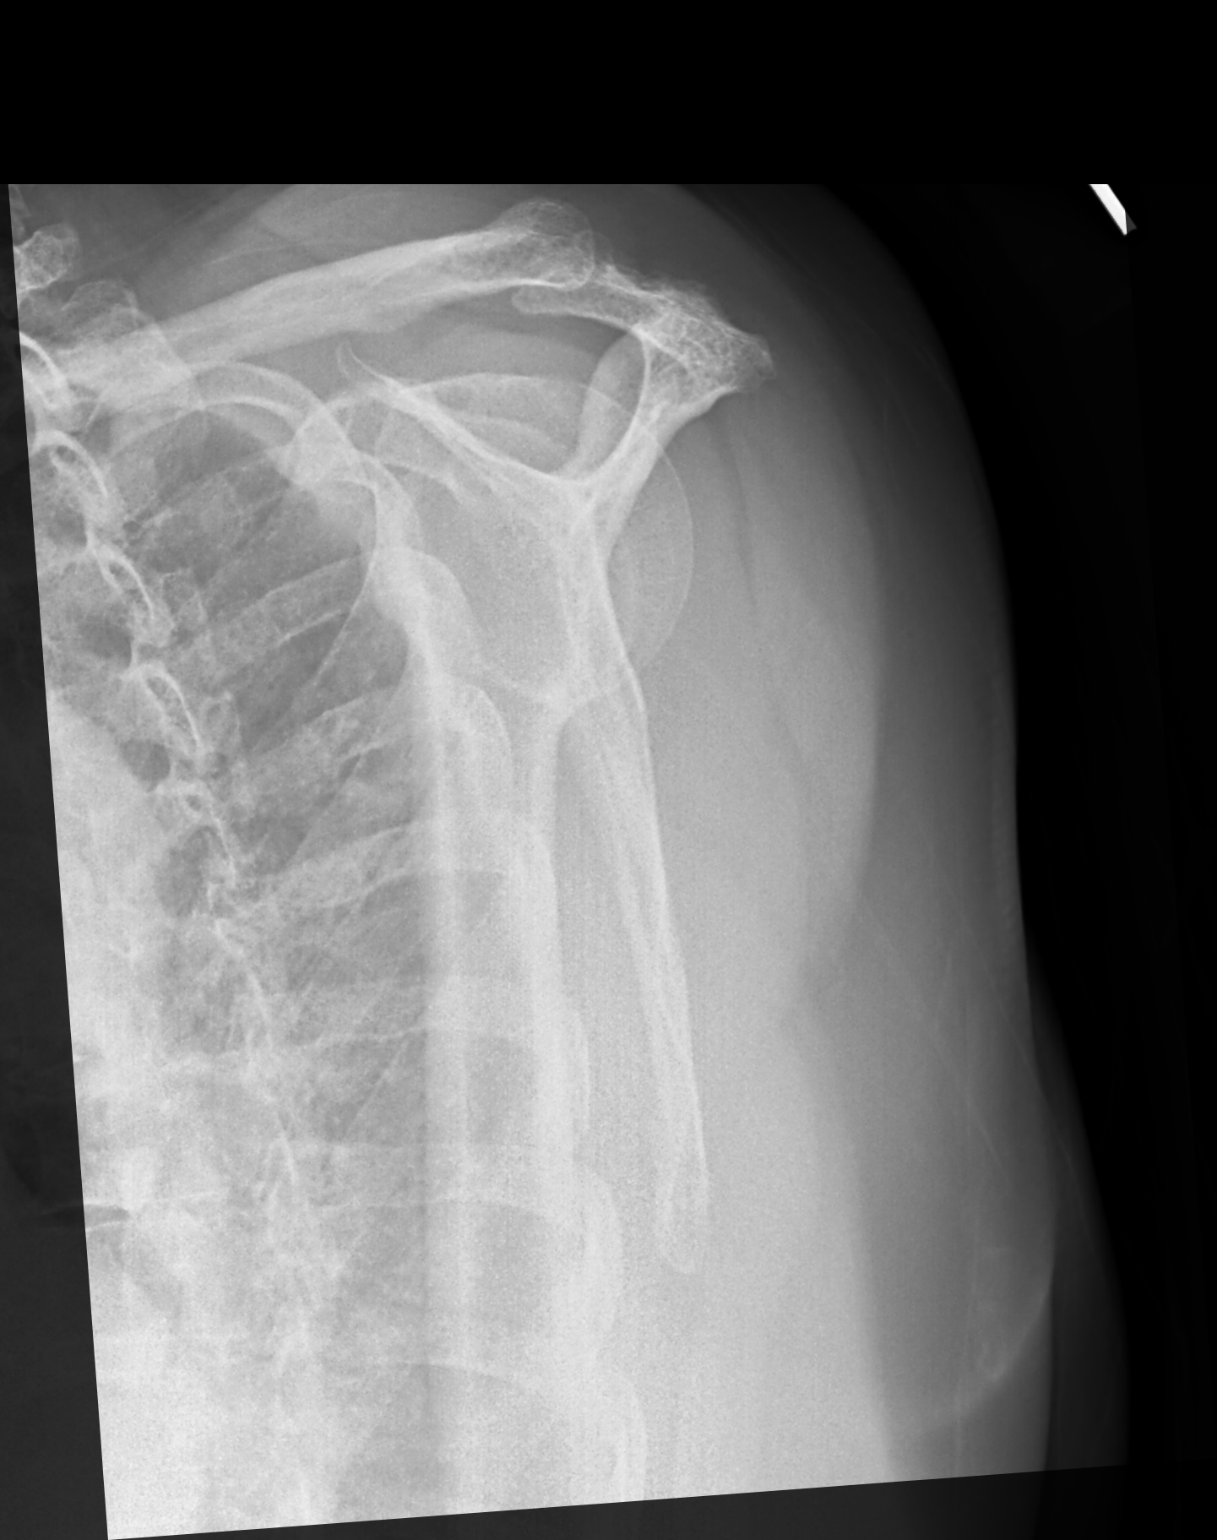

[2 of 2 positions shown; findings below may reference images not displayed]

FINDINGS: There is no evidence of fracture or dislocation. There is no
evidence of arthropathy or other focal bone abnormality. Soft
tissues are unremarkable.
IMPRESSION: Negative.

## 2019-09-20 ENCOUNTER — Ambulatory Visit
Admission: RE | Admit: 2019-09-20 | Discharge: 2019-09-20 | Disposition: A | Discharge status: Home / Self Care | Payer: Medicare Other | Source: Ambulatory Visit | Attending: Internal Medicine | Admitting: Internal Medicine

## 2019-09-20 DIAGNOSIS — Z1231 Encounter for screening mammogram for malignant neoplasm of breast: Secondary | ICD-10-CM | POA: Diagnosis not present

## 2019-10-03 DIAGNOSIS — Z9989 Dependence on other enabling machines and devices: Secondary | ICD-10-CM | POA: Diagnosis not present

## 2019-10-03 DIAGNOSIS — F40240 Claustrophobia: Secondary | ICD-10-CM | POA: Diagnosis not present

## 2019-10-03 DIAGNOSIS — I5022 Chronic systolic (congestive) heart failure: Secondary | ICD-10-CM | POA: Diagnosis not present

## 2019-10-03 DIAGNOSIS — E782 Mixed hyperlipidemia: Secondary | ICD-10-CM | POA: Diagnosis not present

## 2019-10-03 DIAGNOSIS — R0602 Shortness of breath: Secondary | ICD-10-CM | POA: Diagnosis not present

## 2019-10-03 DIAGNOSIS — I25118 Atherosclerotic heart disease of native coronary artery with other forms of angina pectoris: Secondary | ICD-10-CM | POA: Diagnosis not present

## 2019-10-03 DIAGNOSIS — I10 Essential (primary) hypertension: Secondary | ICD-10-CM | POA: Diagnosis not present

## 2019-10-03 DIAGNOSIS — I509 Heart failure, unspecified: Secondary | ICD-10-CM | POA: Diagnosis not present

## 2019-10-03 DIAGNOSIS — J449 Chronic obstructive pulmonary disease, unspecified: Secondary | ICD-10-CM | POA: Diagnosis not present

## 2019-10-03 DIAGNOSIS — E119 Type 2 diabetes mellitus without complications: Secondary | ICD-10-CM | POA: Diagnosis not present

## 2019-10-03 DIAGNOSIS — G4733 Obstructive sleep apnea (adult) (pediatric): Secondary | ICD-10-CM | POA: Diagnosis not present

## 2019-11-02 ENCOUNTER — Telehealth: Payer: Self-pay | Admitting: Internal Medicine

## 2019-11-02 NOTE — Chronic Care Management (AMB) (Signed)
  Chronic Care Management   Note  11/02/2019 Name: Stacie Mcdonald MRN: [id number] DOB: [birth year]  Stacie Mcdonald is a 73 y.o. year old female who is a primary care patient of Derrel Nip, Aris Everts, MD. I reached out to Stacie Mcdonald by phone today in response to a referral sent by Ms. Jenetta Downer Bonsignore's health plan.     [name] was given information about Chronic Care Management services today including:  1. CCM service includes personalized support from designated clinical staff supervised by her physician, including individualized plan of care and coordination with other care providers 2. 24/7 contact phone numbers for assistance for urgent and routine care needs. 3. Service will only be billed when office clinical staff spend 20 minutes or more in a month to coordinate care. 4. Only one practitioner may furnish and bill the service in a calendar month. 5. The patient may stop CCM services at any time (effective at the end of the month) by phone call to the office staff. 6. The patient will be responsible for cost sharing (co-pay) of up to 20% of the service fee (after annual deductible is met).  Patient agreed to services and verbal consent obtained.   Follow up plan: Telephone appointment with CCM team member scheduled for: 12/07/2019  [place], [place] [facility]  [place], [place] [address] Direct Dial: [email or web address]  Website: [email or web address]

## 2019-11-24 ENCOUNTER — Other Ambulatory Visit: Payer: Self-pay | Admitting: Internal Medicine

## 2019-11-24 MED ORDER — GABAPENTIN 100 MG PO CAPS: 100.0000 mg | ORAL_CAPSULE | Freq: Three times a day (TID) | ORAL | 3 refills | Status: DC

## 2019-11-24 MED ORDER — TIZANIDINE HCL 2 MG PO CAPS: 2.0000 mg | ORAL_CAPSULE | Freq: Three times a day (TID) | ORAL | 0 refills | Status: DC

## 2019-11-24 MED ORDER — PREDNISONE 10 MG PO TABS: ORAL_TABLET | ORAL | 0 refills | Status: DC

## 2019-12-07 ENCOUNTER — Ambulatory Visit (INDEPENDENT_AMBULATORY_CARE_PROVIDER_SITE_OTHER): Payer: Medicare Other | Admitting: Pharmacist

## 2019-12-07 DIAGNOSIS — I25118 Atherosclerotic heart disease of native coronary artery with other forms of angina pectoris: Secondary | ICD-10-CM

## 2019-12-07 DIAGNOSIS — E1169 Type 2 diabetes mellitus with other specified complication: Secondary | ICD-10-CM | POA: Diagnosis not present

## 2019-12-07 DIAGNOSIS — E785 Hyperlipidemia, unspecified: Secondary | ICD-10-CM | POA: Diagnosis not present

## 2019-12-07 DIAGNOSIS — M5386 Other specified dorsopathies, lumbar region: Secondary | ICD-10-CM

## 2019-12-07 NOTE — Chronic Care Management (AMB) (Signed)
Chronic Care Management   Note  12/07/2019 Name: Stacie Mcdonald MRN: [id number] DOB: [birth year]   Subjective:  Stacie Mcdonald is a 74 y.o. year old female who is a primary care patient of Derrel Nip, Aris Everts, MD. The CCM team was consulted  by the patient's insurance plan for assistance with chronic disease management and care coordination needs.    Contacted patient telephonically for medication management review.   Review of patient status, including review of consultants reports, laboratory and other test data, was performed as part of comprehensive evaluation and provision of chronic care management services.   Objective:  Lab Results  Component Value Date   CREATININE 0.90 08/02/2019   CREATININE 0.8 07/27/2019   CREATININE 0.70 04/15/2019    Lab Results  Component Value Date   HGBA1C 6.4 08/02/2019       Component Value Date/Time   CHOL 128 07/27/2019 0000   CHOL 163 12/25/2013 0808   TRIG 128 07/27/2019 0000   TRIG 88 12/25/2013 0808   HDL 43 07/27/2019 0000   HDL 45 12/25/2013 0808   CHOLHDL 5 07/27/2018 1052   VLDL 37.0 07/27/2018 1052   VLDL 18 12/25/2013 0808   LDLCALC 59 07/27/2019 0000   LDLCALC 100 12/25/2013 0808   LDLDIRECT 123.0 01/20/2018 1206    Clinical ASCVD: Yes  The ASCVD Risk score (Goff DC Jr., et al., 2013) failed to calculate for the following reasons:   The patient has a prior MI or stroke diagnosis    BP Readings from Last 3 Encounters:  08/18/19 126/72  07/28/19 132/70  07/27/18 120/72    Allergies  Allergen Reactions  . Penicillins Hives    Has patient had a PCN reaction causing immediate rash, facial/tongue/throat swelling, SOB or lightheadedness with hypotension: No Has patient had a PCN reaction causing severe rash involving mucus membranes or skin necrosis: Yes Has patient had a PCN reaction that required hospitalization: No Has patient had a PCN reaction occurring within the last 10 years: Yes If all of the above answers  are "NO", then may proceed with Cephalosporin use.   . Statins     Other reaction(s): Muscle Pain    Medications Reviewed Today    Reviewed by De Hollingshead, [facility] (Pharmacist) on 12/07/19 at [place] List Status: <None>  Medication Order Taking? Sig Documenting Provider Last Dose Status Informant  aspirin 81 MG tablet [id number] Yes Take 81 mg by mouth daily.  [provider] Taking Active Self  clopidogrel (PLAVIX) 75 MG tablet [id number] Yes TAKE ONE TABLET BY MOUTH DAILY Crecencio Mc, MD Taking Active Self  ENTRESTO 24-26 MG [id number] Yes Take 1 tablet by mouth 2 (two) times daily. [provider] Taking Active   furosemide (LASIX) 40 MG tablet [id number] Yes Take 40 mg by mouth daily. [provider] Taking Active            Med Note Darnelle Maffucci, Maralyn Sago Dec 07, 2019 10:10 AM) Takes 40 mg usually, can take 80 mg as needed  gabapentin (NEURONTIN) 100 MG capsule [id number] Yes Take 1 capsule (100 mg total) by mouth 3 (three) times daily. Crecencio Mc, MD Taking Active   GLUCOSAMINE-CHONDROITIN PO [id number] Yes Take 2 tablets by mouth daily. [provider] Taking Active Self  isosorbide mononitrate (IMDUR) 60 MG 24 hr tablet [id number] Yes Take 120 mg by mouth daily.  [provider] Taking Active Self  Lancets (FREESTYLE) lancets [id number] Yes uSE TO CHECK  SUGARS ONCE DAILY e11.9 Crecencio Mc, MD Taking Active Self  loratadine (CLARITIN) 10 MG tablet [id number] Yes Take 10 mg by mouth daily as needed for allergies. [provider] Taking Active Self  metoprolol (TOPROL-XL) 200 MG 24 hr tablet [id number] Yes TAKE 1 TABLET BY MOUTH DAILY. Crecencio Mc, MD Taking Active Self  nitroGLYCERIN (NITROSTAT) 0.4 MG SL tablet [id number] No Place 0.4 mg under the tongue every 5 (five) minutes as needed for chest pain. [provider] Not Taking Active   omeprazole (PRILOSEC) 20 MG capsule [id number] Yes Take 1 capsule (20 mg  total) by mouth daily.  Patient taking differently: Take 20 mg by mouth daily as needed (acid reflux).    Crecencio Mc, MD Taking Active Self  ONE TOUCH ULTRA TEST test strip [id number] Yes CHECK DAILY Crecencio Mc, MD Taking Active Self  PRALUENT 45 MG/ML SOAJ [id number] Yes every 14 (fourteen) days.  [provider] Taking Active   ranolazine (RANEXA) 500 MG 12 hr tablet [id number] Yes Take 500 mg by mouth 2 (two) times daily. [provider] Taking Active   spironolactone (ALDACTONE) 25 MG tablet [id number] Yes Take 1 tablet (25 mg total) by mouth daily. Crecencio Mc, MD Taking Active            Assessment:   Goals Addressed            This Visit's Progress     Patient Stated   . PharmD "I want to stay healthy" (pt-stated)       Current Barriers:  . Polypharmacy; complex patient with multiple comorbidities including HFrEF (last EF 47%), CAD (last stent placement 03/2018, continued occasional angina w/ exertion), T2DM . Self-manages medications. 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 Biggest concern today is sciatic nerve pain o HFrEF: follows w/ [facility] Dr. Edwin Dada, Dr. Clayborn Bigness; Entresto 24/26 mg BID, metoprolol succinate 200 mg daily, spironolactone 25 mg daily, furosemide 40 mg daily, can take up to 80 PRN. Notes she has only needed 80 mg maybe once in the past 1-2 months. o CAD/ASCVD risk reduction: statin intolerant, on Praluent 75 mg Q14 days, LDL at goal <70; ASA 81 mg daily, clopidogrel 75 mg daily, isosorbide mononitrate ER 120 mg daily, ranolazine 500 mg BID for angina  o T2DM: diet controlled, last A1C 6.4% o Sciatic pain: appt w/ Dr. Derrel Nip tomorrow for follow up. Notes that prednisone taper provided little benefit, gabapentin 100 mg TID providing some benefit. Denies sedation. Wonders about this being a seizure medication. Notes the pain wakes her up in the middle of the night, is worst in the  morning, and it takes her a few hours to work out the pain before she is able to perform activities, pick things up. Has also used APAP around the clock, heat therapy, topical pain relief that includes capsaicin and salonpas o GERD: omeprazole 20 mg daily o Allergies: cetirizine 10 mg PRN  Pharmacist Clinical Goal(s):  Marland Kitchen Over the next 90 days, patient will work with PharmD and provider towards optimized medication management  Interventions: . Comprehensive medication review performed; medication list updated in electronic medical record . Discussed daily weights, monitoring of fluid status. Reviewed benefits of GDMT. Encouraged continued f/u with cardiology team.  . Discussed patient assistance programs for [place], Praluent. Patient is over income for either of these programs, and notes that she feels comfortable in her ability to afford these moving forward.  Marland Kitchen  Encouraged f/u with Dr. Derrel Nip tomorrow to discuss next steps in therapy for back pain. Reviewed that gabapentin is technically a seizure medication, but is used most often now for nerve pain. Discussed that higher doses may cause sedation.   Patient Self Care Activities:  . Patient will take medications as prescribed  Initial goal documentation        Plan: - Scheduled f/u call 01/29/20  Catie Darnelle Maffucci, PharmD, BCACP, CPP Clinical Pharmacist [facility] [phone]

## 2019-12-07 NOTE — Patient Instructions (Signed)
Visit Information  Goals Addressed            This Visit's Progress     Patient Stated   . PharmD "I want to stay healthy" (pt-stated)       Current Barriers:  . Polypharmacy; complex patient with multiple comorbidities including HFrEF (last EF 47%), CAD (last stent placement 03/2018, continued occasional angina w/ exertion), T2DM . Self-manages medications. 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 Biggest concern today is sciatic nerve pain o HFrEF: follows w/ [facility] Dr. Edwin Dada, Dr. Clayborn Bigness; Entresto 24/26 mg BID, metoprolol succinate 200 mg daily, spironolactone 25 mg daily, furosemide 40 mg daily, can take up to 80 PRN. Notes she has only needed 80 mg maybe once in the past 1-2 months. o CAD/ASCVD risk reduction: statin intolerant, on Praluent 75 mg Q14 days, LDL at goal <70; ASA 81 mg daily, clopidogrel 75 mg daily, isosorbide mononitrate ER 120 mg daily, ranolazine 500 mg BID for angina  o T2DM: diet controlled, last A1C 6.4% o Sciatic pain: appt w/ Dr. Derrel Nip tomorrow for follow up. Notes that prednisone taper provided little benefit, gabapentin 100 mg TID providing some benefit. Denies sedation. Wonders about this being a seizure medication. Notes the pain wakes her up in the middle of the night, is worst in the morning, and it takes her a few hours to work out the pain before she is able to perform activities, pick things up. Has also used APAP around the clock, heat therapy, topical pain relief that includes capsaicin and salonpas o GERD: omeprazole 20 mg daily o Allergies: cetirizine 10 mg PRN  Pharmacist Clinical Goal(s):  Marland Kitchen Over the next 90 days, patient will work with PharmD and provider towards optimized medication management  Interventions: . Comprehensive medication review performed; medication list updated in electronic medical record . Discussed daily weights, monitoring of fluid status. Reviewed benefits of GDMT.  Encouraged continued f/u with cardiology team.  . Discussed patient assistance programs for [place], Praluent. Patient is over income for either of these programs, and notes that she feels comfortable in her ability to afford these moving forward.  . Encouraged f/u with Dr. Derrel Nip tomorrow to discuss next steps in therapy for back pain. Reviewed that gabapentin is technically a seizure medication, but is used most often now for nerve pain. Discussed that higher doses may cause sedation.   Patient Self Care Activities:  . Patient will take medications as prescribed  Initial goal documentation        The patient verbalized understanding of instructions provided today and declined a print copy of patient instruction materials.    Plan: - Scheduled f/u call 01/29/20  Catie Darnelle Maffucci, PharmD, BCACP, [place] Pharmacist [place] [phone]

## 2019-12-08 ENCOUNTER — Other Ambulatory Visit: Payer: Self-pay

## 2019-12-08 ENCOUNTER — Ambulatory Visit (INDEPENDENT_AMBULATORY_CARE_PROVIDER_SITE_OTHER): Payer: Medicare Other | Admitting: Internal Medicine

## 2019-12-08 DIAGNOSIS — M545 Low back pain, unspecified: Secondary | ICD-10-CM

## 2019-12-08 DIAGNOSIS — G8929 Other chronic pain: Secondary | ICD-10-CM

## 2019-12-08 DIAGNOSIS — I25118 Atherosclerotic heart disease of native coronary artery with other forms of angina pectoris: Secondary | ICD-10-CM | POA: Diagnosis not present

## 2019-12-08 MED ORDER — GABAPENTIN 100 MG PO CAPS: 200.0000 mg | ORAL_CAPSULE | Freq: Three times a day (TID) | ORAL | 3 refills | Status: DC

## 2019-12-08 MED ORDER — TRAMADOL HCL 50 MG PO TABS: 50.0000 mg | ORAL_TABLET | Freq: Four times a day (QID) | ORAL | 0 refills | Status: AC | PRN

## 2019-12-08 MED ORDER — TIZANIDINE HCL 2 MG PO CAPS: 2.0000 mg | ORAL_CAPSULE | Freq: Three times a day (TID) | ORAL | 0 refills | Status: DC

## 2019-12-08 NOTE — Assessment & Plan Note (Addendum)
Pain has been present now since early December and has not been relieved with prednisone taper, MR or gabapentin .  She has a history of a wedge compression fracture in the lower thoracic spine noted on CT Sept 2020.  Will increase gabapentin dose , add tramadol for pian and refer to Orthopedicss for evaluation with imaging and treatment  . ddc includes new  vertebral fracture given her history of osteoporosis, degenerative disc disease with  foraminal stenosis vs spinal stenosis from prior thoracic vertebral fracture

## 2019-12-08 NOTE — Progress Notes (Addendum)
Virtual Visit converted to Telephone Note  This visit type was conducted due to national recommendations for restrictions regarding the COVID-19 pandemic (e.g. social distancing).  This format is felt to be most appropriate for this patient at this time.  All issues noted in this document were discussed and addressed.  No physical exam was performed (except for noted visual exam findings with Video Visits).   I attempted to connect with@ on 12/08/19 at  9:00 AM EST by a video enabled telemedicine application  and verified that I am speaking with the correct person using two identifiers. Location patient: home Location provider: work or home office Persons participating in the virtual visit: patient, provider  I discussed the limitations, risks, security and privacy concerns of performing an evaluation and management service by telephone and the availability of in person appointments. I also discussed with the patient that there may be a patient responsible charge related to this service. The patient expressed understanding and agreed to proceed.  Interactive audio and video telecommunications were attempted between this provider and patient, however failed, due to patient having technical difficulties OR patient did not have access to video capability.  We continued and completed visit with audio only.   Reason for visit: sciatica   HPI:  74 yr old with chronic low back pain currently radiating to left knee.  Was treated with prednisone , low dose gabapentin and tizanidine on Jan 8 after trying capsaicin and Salon Pas patches  with no relief,  But states that the pain has not improrved,  She can no longer tolerate her exercise class.  She  Denies foot drop and numbness of toes . No history of fall,  States that the left gluteus muscle is tender and the pain radiates down the posterior thigh to the left knee but not below    ROS: Patient denies headache, fevers, malaise, unintentional weight loss,  skin rash, eye pain, sinus congestion and sinus pain, sore throat, dysphagia,  hemoptysis , cough, dyspnea, wheezing, chest pain, palpitations, orthopnea, edema, abdominal pain, nausea, melena, diarrhea, constipation, flank pain, dysuria, hematuria, urinary  Frequency, nocturia, numbness, tingling, seizures,  Focal weakness, Loss of consciousness,  Tremor, insomnia, depression, anxiety, and suicidal ideation.      Past Medical History:  Diagnosis Date  . Allergy   . Arthritis    fingers, knees  . Asthma   . Breast Cancer 1991   R mastectomy. XRT , chemo  . Breast cancer ([place]) 1991   RT MASTECTOMY  . CAD (coronary artery disease) 2008   50% LAD occlusion 2009,  stent in mid LAD 2008 for 95%   . Chicken pox   . Colon polyps April 2009   by Colonoscopy, [facility]  . GERD (gastroesophageal reflux disease)   . Heart disease   . Hyperlipidemia   . Hypertension   . Myocardial infarction ([place])    silent. discovered through testing  . 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 Rheumatic fever   . Sleep apnea    CPAP  . Stable angina ([place]) 07/27/2019  . Urinary tract infection   . Wears contact lenses    left only    Past Surgical History:  Procedure Laterality Date  . BREAST BIOPSY     right breast reconstruction S/P mastectomy  . BREAST SURGERY     right mastectomy  . COLONOSCOPY WITH PROPOFOL N/A 08/18/2019   Procedure: COLONOSCOPY WITH PROPOFOL;  Surgeon: [name], MD;  Location: [facility];  Service: Endoscopy;  Laterality: N/A;  sleep apnea  . CORONARY ANGIOPLASTY WITH STENT PLACEMENT  Jan 2013   mid RCA 75% occlusion resolved to 0%, [place]  . CORONARY STENT INTERVENTION N/A 04/07/2018   Procedure: CORONARY STENT INTERVENTION;  Surgeon: Yolonda Kida, MD;  Location: [facility];  Service: Cardiovascular;  Laterality: N/A;  . MASTECTOMY Right 1991   BREAST CA  . POLYPECTOMY  08/18/2019   Procedure: POLYPECTOMY;  Surgeon: [name], MD;  Location: [facility];  Service: Endoscopy;;  . RIGHT/LEFT HEART CATH AND CORONARY ANGIOGRAPHY N/A 04/07/2018   Procedure: RIGHT/LEFT HEART CATH AND CORONARY ANGIOGRAPHY;  Surgeon: Yolonda Kida, MD;  Location: [facility];  Service: Cardiovascular;  Laterality: N/A;  . TONSILLECTOMY      Family History  Problem Relation Age of Onset  . Heart disease Mother 72       massive MI  . Heart disease Father 46       AMI  . Cancer Father        metastatic lung CA  . Diabetes Sister   . Cancer Maternal Aunt   . Breast cancer Maternal Aunt 58  . Breast cancer Cousin        20s  . Breast cancer Cousin        2s    SOCIAL HX:  reports that she has never smoked. She has never used smokeless tobacco. She reports current alcohol use of about 1.0 standard drinks of alcohol per week. She reports that she does not use drugs.   Current Outpatient Medications:  .  aspirin 81 MG tablet, Take 81 mg by mouth daily. , Disp: , Rfl:  .  clopidogrel (PLAVIX) 75 MG tablet, TAKE ONE TABLET BY MOUTH DAILY, Disp: 90 tablet, Rfl: 1 .  ENTRESTO 24-26 MG, Take 1 tablet by mouth 2 (two) times daily., Disp: , Rfl:  .  furosemide (LASIX) 40 MG tablet, Take 40 mg by mouth daily., Disp: , Rfl:  .  gabapentin (NEURONTIN) 100 MG capsule, Take 2 capsules (200 mg total) by mouth 3 (three) times daily., Disp: 180 capsule, Rfl: 3 .  GLUCOSAMINE-CHONDROITIN PO, Take 2 tablets by mouth daily., Disp: , Rfl:  .  isosorbide mononitrate (IMDUR) 120 MG 24 hr tablet, Take 120 mg by mouth daily. , Disp: , Rfl:  .  Lancets (FREESTYLE) lancets, uSE TO CHECK SUGARS ONCE DAILY e11.9, Disp: 100 each, Rfl: 3 .  loratadine (CLARITIN) 10 MG tablet, Take 10 mg by mouth daily as needed for allergies., Disp: , Rfl:  .  metoprolol (TOPROL-XL) 200 MG 24 hr tablet, TAKE 1 TABLET BY MOUTH DAILY., Disp: 90 tablet, Rfl: 1 .  nitroGLYCERIN (NITROSTAT) 0.4 MG SL tablet, Place 0.4 mg under the tongue every 5 (five)  minutes as needed for chest pain., Disp: , Rfl:  .  omeprazole (PRILOSEC) 20 MG capsule, Take 1 capsule (20 mg total) by mouth daily. (Patient taking differently: Take 20 mg by mouth daily as needed (acid reflux). ), Disp: 90 capsule, Rfl: 1 .  ONE TOUCH ULTRA TEST test strip, CHECK DAILY, Disp: 100 each, Rfl: 2 .  PRALUENT 75 MG/ML SOAJ, every 14 (fourteen) days. , Disp: , Rfl:  .  ranolazine (RANEXA) 500 MG 12 hr tablet, Take 500 mg by mouth 2 (two) times daily., Disp: , Rfl:  .  spironolactone (ALDACTONE) 25 MG tablet, Take 1 tablet (25 mg total) by mouth daily., Disp: 90  tablet, Rfl: 0 .  tizanidine (ZANAFLEX) 2 MG capsule, Take 1 capsule (2 mg total) by mouth 3 (three) times daily., Disp: 30 capsule, Rfl: 0 .  traMADol (ULTRAM) 50 MG tablet, Take 1 tablet (50 mg total) by mouth every 6 (six) hours as needed for up to 28 days., Disp: 30 tablet, Rfl: 0  EXAM:   General impression: alert, cooperative and articulate.  No signs of being in distress  Lungs: speech is fluent sentence length suggests that patient is not short of breath and not punctuated by cough, sneezing or sniffing.  MSK: patient was instructed to try a supported back extension maneuver while standing and reports that even minimal extensio naggravated her back pain to a considerable degree and she was unwilling to continue the exercise .   Psych: affect normal.  speech is articulate and non pressured .    ASSESSMENT AND PLAN:  Discussed the following assessment and plan:  Chronic left-sided low back pain without sciatica - Plan: Ambulatory referral to Orthopedic Surgery  Chronic left-sided low back pain without sciatica Pain has been present now since early December and has not been relieved with prednisone taper, MR or gabapentin .  Will increase gabapentin dose , add trmadol for pian and refer to Orthopedicss for evaluation with imaging and treatment      I discussed the assessment and treatment plan with the patient.  The patient was provided an opportunity to ask questions and all were answered. The patient agreed with the plan and demonstrated an understanding of the instructions.   I provided  25 minutes of non-face-to-face time during this encounter reviewing patient's current problems and past procedures/imaging studies, providing counseling on the above mentioned problems , and coordination  of care .  The patient was advised to call back or seek an in-person evaluation if the symptoms worsen or if the condition fails to improve as anticipated.  Crecencio Mc, MD

## 2019-12-17 NOTE — Addendum Note (Signed)
Addended by: Crecencio Mc on: 12/17/2019 04:28 PM   Modules accepted: Level of Service

## 2019-12-26 DIAGNOSIS — M5416 Radiculopathy, lumbar region: Secondary | ICD-10-CM | POA: Diagnosis not present

## 2019-12-27 DIAGNOSIS — M5416 Radiculopathy, lumbar region: Secondary | ICD-10-CM | POA: Diagnosis not present

## 2019-12-27 DIAGNOSIS — M545 Low back pain: Secondary | ICD-10-CM | POA: Diagnosis not present

## 2019-12-29 DIAGNOSIS — M545 Low back pain: Secondary | ICD-10-CM | POA: Diagnosis not present

## 2019-12-29 DIAGNOSIS — M5416 Radiculopathy, lumbar region: Secondary | ICD-10-CM | POA: Diagnosis not present

## 2020-01-01 ENCOUNTER — Telehealth: Payer: Self-pay

## 2020-01-01 DIAGNOSIS — M5416 Radiculopathy, lumbar region: Secondary | ICD-10-CM | POA: Diagnosis not present

## 2020-01-01 DIAGNOSIS — M545 Low back pain: Secondary | ICD-10-CM | POA: Diagnosis not present

## 2020-01-01 NOTE — Telephone Encounter (Signed)
Prior authorization submitted through Covermymeds for gabapentin.   Prior Stacie Mcdonald has been approved.

## 2020-01-03 ENCOUNTER — Other Ambulatory Visit
Admission: RE | Admit: 2020-01-03 | Discharge: 2020-01-03 | Disposition: A | Discharge status: Home / Self Care | Payer: Medicare Other | Source: Ambulatory Visit | Attending: Student | Admitting: Student

## 2020-01-03 DIAGNOSIS — R0600 Dyspnea, unspecified: Secondary | ICD-10-CM | POA: Diagnosis not present

## 2020-01-03 DIAGNOSIS — I5022 Chronic systolic (congestive) heart failure: Secondary | ICD-10-CM | POA: Diagnosis not present

## 2020-01-03 DIAGNOSIS — I10 Essential (primary) hypertension: Secondary | ICD-10-CM | POA: Diagnosis not present

## 2020-01-03 DIAGNOSIS — Z5181 Encounter for therapeutic drug level monitoring: Secondary | ICD-10-CM | POA: Insufficient documentation

## 2020-01-03 DIAGNOSIS — I209 Angina pectoris, unspecified: Secondary | ICD-10-CM | POA: Diagnosis not present

## 2020-01-03 DIAGNOSIS — J449 Chronic obstructive pulmonary disease, unspecified: Secondary | ICD-10-CM | POA: Diagnosis not present

## 2020-01-03 DIAGNOSIS — Z79899 Other long term (current) drug therapy: Secondary | ICD-10-CM | POA: Diagnosis not present

## 2020-01-03 DIAGNOSIS — I25118 Atherosclerotic heart disease of native coronary artery with other forms of angina pectoris: Secondary | ICD-10-CM | POA: Diagnosis not present

## 2020-01-03 DIAGNOSIS — E782 Mixed hyperlipidemia: Secondary | ICD-10-CM | POA: Diagnosis not present

## 2020-01-03 LAB — BRAIN NATRIURETIC PEPTIDE: B Natriuretic Peptide: 58 pg/mL (ref 0.0–100.0)

## 2020-01-05 DIAGNOSIS — M5416 Radiculopathy, lumbar region: Secondary | ICD-10-CM | POA: Diagnosis not present

## 2020-01-05 DIAGNOSIS — M545 Low back pain: Secondary | ICD-10-CM | POA: Diagnosis not present

## 2020-01-16 DIAGNOSIS — M5416 Radiculopathy, lumbar region: Secondary | ICD-10-CM | POA: Diagnosis not present

## 2020-01-16 DIAGNOSIS — M545 Low back pain: Secondary | ICD-10-CM | POA: Diagnosis not present

## 2020-01-22 DIAGNOSIS — E113393 Type 2 diabetes mellitus with moderate nonproliferative diabetic retinopathy without macular edema, bilateral: Secondary | ICD-10-CM | POA: Diagnosis not present

## 2020-01-22 LAB — HM DIABETES EYE EXAM

## 2020-01-29 ENCOUNTER — Telehealth: Payer: Medicare Other

## 2020-01-29 ENCOUNTER — Ambulatory Visit: Payer: Self-pay | Admitting: Pharmacist

## 2020-01-29 NOTE — Progress Notes (Signed)
  I have reviewed the above information and agree with above.   Cylah Fannin, MD 

## 2020-01-29 NOTE — Chronic Care Management (AMB) (Signed)
  Chronic Care Management   Note  01/29/2020 Name: Stacie Mcdonald MRN: [id number] DOB: Mar 25, 1946  Stacie Mcdonald is a 74 y.o. year old female who is a primary care patient of Derrel Nip, Aris Everts, MD. The CCM team was consulted for assistance with chronic disease management and care coordination needs.    Attempted to contact patient for medication management review. Left HIPAA compliant message for patient to return my call at their convenience.   Plan: - Will collaborate with Care Guide to outreach to schedule follow up with me  Catie Darnelle Maffucci, PharmD, [place], [place] Pharmacist [place] [phone]

## 2020-01-30 ENCOUNTER — Telehealth: Payer: Self-pay | Admitting: Internal Medicine

## 2020-01-30 NOTE — Chronic Care Management (AMB) (Signed)
  Care Management   Note  01/30/2020 Name: Stacie Mcdonald MRN: [id number] DOB: 09/13/1946  [name] is a 75 y.o. year old female who is a primary care patient of Crecencio Mc, MD and is actively engaged with the care management team. I reached out to [name] by phone today to assist with re-scheduling a follow up visit with the Pharmacist  Follow up plan: Unsuccessful telephone outreach attempt made. A HIPPA compliant phone message was left for the patient providing contact information and requesting a return call.  The care management team will reach out to the patient again over the next 7 days.  If patient returns call to provider office, please advise to call [facility]  at [place], [place], [place], [place] [address] Direct Dial: [phone] [email or web address] Website: [email or web address]

## 2020-02-02 NOTE — Chronic Care Management (AMB) (Signed)
  Care Management   Note  02/02/2020 Name: Stacie Mcdonald MRN: [id number] DOB: 1946/10/31  Stacie Mcdonald is a 74 y.o. year old female who is a primary care patient of Stacie Mcdonald, Stacie Everts, MD and is actively engaged with the care management team. I reached out to Stacie Mcdonald by phone today to assist with re-scheduling a follow up visit with the Pharmacist  Follow up plan: Telephone appointment with care management team member scheduled for:03/21/2020  Stacie Mcdonald, [place], [place], [place] [address] Direct Dial: [phone] [email or web address]

## 2020-02-07 ENCOUNTER — Other Ambulatory Visit: Payer: Self-pay

## 2020-02-07 ENCOUNTER — Ambulatory Visit
Admission: RE | Admit: 2020-02-07 | Discharge: 2020-02-07 | Disposition: A | Discharge status: Home / Self Care | Payer: Medicare Other | Source: Ambulatory Visit | Attending: Internal Medicine | Admitting: Internal Medicine

## 2020-02-07 DIAGNOSIS — J849 Interstitial pulmonary disease, unspecified: Secondary | ICD-10-CM | POA: Insufficient documentation

## 2020-02-07 DIAGNOSIS — R918 Other nonspecific abnormal finding of lung field: Secondary | ICD-10-CM | POA: Diagnosis not present

## 2020-02-09 ENCOUNTER — Other Ambulatory Visit: Payer: Self-pay | Admitting: Internal Medicine

## 2020-02-09 DIAGNOSIS — E785 Hyperlipidemia, unspecified: Secondary | ICD-10-CM

## 2020-02-09 DIAGNOSIS — E119 Type 2 diabetes mellitus without complications: Secondary | ICD-10-CM

## 2020-03-21 ENCOUNTER — Ambulatory Visit (INDEPENDENT_AMBULATORY_CARE_PROVIDER_SITE_OTHER): Payer: Medicare Other | Admitting: Pharmacist

## 2020-03-21 ENCOUNTER — Telehealth: Payer: Medicare Other

## 2020-03-21 DIAGNOSIS — E785 Hyperlipidemia, unspecified: Secondary | ICD-10-CM | POA: Diagnosis not present

## 2020-03-21 DIAGNOSIS — H40003 Preglaucoma, unspecified, bilateral: Secondary | ICD-10-CM | POA: Diagnosis not present

## 2020-03-21 DIAGNOSIS — M791 Myalgia, unspecified site: Secondary | ICD-10-CM | POA: Insufficient documentation

## 2020-03-21 DIAGNOSIS — I25118 Atherosclerotic heart disease of native coronary artery with other forms of angina pectoris: Secondary | ICD-10-CM

## 2020-03-21 DIAGNOSIS — H2513 Age-related nuclear cataract, bilateral: Secondary | ICD-10-CM | POA: Diagnosis not present

## 2020-03-21 DIAGNOSIS — T466X5A Adverse effect of antihyperlipidemic and antiarteriosclerotic drugs, initial encounter: Secondary | ICD-10-CM | POA: Insufficient documentation

## 2020-03-21 NOTE — Chronic Care Management (AMB) (Signed)
Chronic Care Management   Follow Up Note   03/21/2020 Name: Stacie Mcdonald MRN: [id number] DOB: 1945/11/24  Referred by: Stacie Mc, MD Reason for referral : Chronic Care Management (Medication Management)   Stacie Mcdonald is a 74 y.o. year old female who is a primary care patient of Stacie Mcdonald, Stacie Everts, MD. The CCM team was consulted for assistance with chronic disease management and care coordination needs.    Contacted patient for medication management review.   Review of patient status, including review of consultants reports, relevant laboratory and other test results, and collaboration with appropriate care team members and the patient's provider was performed as part of comprehensive patient evaluation and provision of chronic care management services.    SDOH (Social Determinants of Health) assessments performed: Yes See Care Plan activities for detailed interventions related to [facility])     Outpatient Encounter Medications as of 03/21/2020  Medication Sig Note  . aspirin 81 MG tablet Take 81 mg by mouth daily.    . clopidogrel (PLAVIX) 75 MG tablet TAKE ONE TABLET BY MOUTH DAILY   . ENTRESTO 24-26 MG Take 1 tablet by mouth 2 (two) times daily.   . furosemide (LASIX) 40 MG tablet Take 40 mg by mouth daily. 12/07/2019: Takes 40 mg usually, can take 80 mg as needed  . gabapentin (NEURONTIN) 100 MG capsule Take 2 capsules (200 mg total) by mouth 3 (three) times daily.   . isosorbide mononitrate (IMDUR) 120 MG 24 hr tablet Take 120 mg by mouth daily.    . metoprolol (TOPROL-XL) 200 MG 24 hr tablet TAKE 1 TABLET BY MOUTH DAILY.   Marland Kitchen PRALUENT 75 MG/ML SOAJ every 14 (fourteen) days.    . ranolazine (RANEXA) 500 MG 12 hr tablet Take 500 mg by mouth 2 (two) times daily.   Marland Kitchen spironolactone (ALDACTONE) 25 MG tablet Take 1 tablet (25 mg total) by mouth daily.   Marland Kitchen GLUCOSAMINE-CHONDROITIN PO Take 2 tablets by mouth daily.   . Lancets (FREESTYLE) lancets uSE TO CHECK SUGARS ONCE DAILY e11.9     . loratadine (CLARITIN) 10 MG tablet Take 10 mg by mouth daily as needed for allergies.   . nitroGLYCERIN (NITROSTAT) 0.4 MG SL tablet Place 0.4 mg under the tongue every 5 (five) minutes as needed for chest pain.   Marland Kitchen omeprazole (PRILOSEC) 20 MG capsule Take 1 capsule (20 mg total) by mouth daily. (Patient taking differently: Take 20 mg by mouth daily as needed (acid reflux). )   . ONE TOUCH ULTRA TEST test strip CHECK DAILY   . tizanidine (ZANAFLEX) 2 MG capsule Take 1 capsule (2 mg total) by mouth 3 (three) times daily.    No facility-administered encounter medications on file as of 03/21/2020.     Objective:   Goals Addressed            This Visit's Progress     Patient Stated   . PharmD "I want to stay healthy" (pt-stated)       Current Barriers:  . Polypharmacy; complex patient with multiple comorbidities including HFrEF (last EF 47%), CAD (last stent placement 03/2018, continued occasional angina w/ exertion), T2DM o Concerned about the cost of her medications. Notes that her copay on Entresto went from $47 -> $121. Wonders about the "[employer]" o Following w/ ortho/PT since back injury in December, doing well . Self-manages medications. 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 Biggest concern today is sciatic nerve  pain o HFrEF: follows w/ [facility] Dr. Edwin Mcdonald, Dr. Clayborn Mcdonald; Entresto 24/26 mg BID, metoprolol succinate 200 mg daily, spironolactone 25 mg daily, furosemide 40 mg daily, can take up to 80 PRN.  o CAD/ASCVD risk reduction: statin intolerant, on Praluent 75 mg Q14 days, LDL at goal <70; ASA 81 mg daily, clopidogrel 75 mg daily, isosorbide mononitrate ER 120 mg daily, ranolazine 500 mg BID; denies recent anginal symptoms severe enough to need NTG o T2DM: diet controlled, last A1C 6.4% o Sciatic pain: gabapentin 100 mg TID PRN o GERD: omeprazole 20 mg daily o Allergies: cetirizine 10 mg PRN  Pharmacist Clinical  Goal(s):  Marland Kitchen Over the next 90 days, patient will work with PharmD and provider towards optimized medication management  Interventions: . Comprehensive medication review performed, medication list updated in electronic medical record . Inter-disciplinary care team collaboration (see longitudinal plan of care) . Overdue for f/u with Dr. Derrel Mcdonald for chronic medical conditions and labs. Reminded her to call to schedule lab work and an appointment . Discussion of MoA of Entresto and benefits of reduction in HF exacerbations and hospitalizations in comparison to ARB . Discussion of benefit of Praluent, MoA vs statins, and benefit of reduction in LDL given her coronary artery disease . Discussed principals of the Medicare Coverage Gap "Donut Hole". Patient will call her insurance company to help project spending and see how long she will be in Coverage Gap phase, and when she will hit Catastrophic coverage where medications are less expensive. She is over income for either Entresto or Praluent assistance  Patient Self Care Activities:  . Patient will take medications as prescribed  Please see past updates related to this goal by clicking on the "Past Updates" button in the selected goal          Plan:  - Patient requested continued CCM services. Scheduled f/u call in ~12 weeks  Stacie Mcdonald, PharmD, Stacie Mcdonald, Stacie Mcdonald Pharmacist [place] [phone]

## 2020-03-21 NOTE — Patient Instructions (Addendum)
Stacie Mcdonald,   It was great talking with you today!  Just for your information, for a 2 person household, the income cut off for [facility] assistance is <$100,000. The income cut off for Praluent assistance is <$87,100.   See the enclosed document for an explanation of the Coverage Gap. Talk with your insurance company about when you will hit the Catastrophic Phase.    Schedule your follow up appointment with Dr. Derrel Nip. She has already ordered lab work for you, so also plan to schedule an lab appointment a few days before your appointment with her.  Call the clinic at [phone]  Call me with any medication questions or concerns!  Visit Information  Goals Addressed            This Visit's Progress     Patient Stated   . PharmD "I want to stay healthy" (pt-stated)       Current Barriers:  . Polypharmacy; complex patient with multiple comorbidities including HFrEF (last EF 47%), CAD (last stent placement 03/2018, continued occasional angina w/ exertion), T2DM o Concerned about the cost of her medications. Notes that her copay on Entresto went from $47 -> $121. Wonders about the "[employer]" o Following w/ ortho/PT since back injury in December, doing well . Self-manages medications. 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 Biggest concern today is sciatic nerve pain o HFrEF: follows w/ [facility] Dr. Edwin Dada, Dr. Clayborn Bigness; Entresto 24/26 mg BID, metoprolol succinate 200 mg daily, spironolactone 25 mg daily, furosemide 40 mg daily, can take up to 80 PRN.  o CAD/ASCVD risk reduction: statin intolerant, on Praluent 75 mg Q14 days, LDL at goal <70; ASA 81 mg daily, clopidogrel 75 mg daily, isosorbide mononitrate ER 120 mg daily, ranolazine 500 mg BID; denies recent anginal symptoms severe enough to need NTG o T2DM: diet controlled, last A1C 6.4% o Sciatic pain: gabapentin 100 mg TID PRN o GERD: omeprazole 20 mg daily o Allergies:  cetirizine 10 mg PRN  Pharmacist Clinical Goal(s):  Marland Kitchen Over the next 90 days, patient will work with PharmD and provider towards optimized medication management  Interventions: . Comprehensive medication review performed, medication list updated in electronic medical record . Inter-disciplinary care team collaboration (see longitudinal plan of care) . Overdue for f/u with Dr. Derrel Nip for chronic medical conditions and labs. Reminded her to call to schedule lab work and an appointment . Discussion of MoA of Entresto and benefits of reduction in HF exacerbations and hospitalizations in comparison to ARB . Discussion of benefit of Praluent, MoA vs statins, and benefit of reduction in LDL given her coronary artery disease . Discussed principals of the Medicare Coverage Gap "Donut Hole". Patient will call her insurance company to help project spending and see how long she will be in Coverage Gap phase, and when she will hit Catastrophic coverage where medications are less expensive. She is over income for either Entresto or Praluent assistance  Patient Self Care Activities:  . Patient will take medications as prescribed  Please see past updates related to this goal by clicking on the "Past Updates" button in the selected goal         The patient verbalized understanding of instructions provided today and agreed to receive a mailed copy of patient instruction and/or educational materials.  Plan:  - Patient requested continued CCM services. Scheduled f/u call in ~12 weeks  Catie Darnelle Maffucci, PharmD, [place], [place]  Network [phone]

## 2020-04-23 DIAGNOSIS — I10 Essential (primary) hypertension: Secondary | ICD-10-CM | POA: Diagnosis not present

## 2020-04-23 DIAGNOSIS — H2512 Age-related nuclear cataract, left eye: Secondary | ICD-10-CM | POA: Diagnosis not present

## 2020-04-24 ENCOUNTER — Encounter: Payer: Self-pay | Admitting: Ophthalmology

## 2020-04-24 ENCOUNTER — Other Ambulatory Visit: Payer: Self-pay

## 2020-04-24 NOTE — Anesthesia Preprocedure Evaluation (Addendum)
Anesthesia Evaluation  Patient identified by MRN, date of birth, ID band Patient awake    Reviewed: Allergy & Precautions, H&P , NPO status , Patient's Chart, lab work & pertinent test results  Airway Mallampati: II  TM Distance: >3 FB Neck ROM: full    Dental no notable dental hx.    Pulmonary asthma , sleep apnea ,    Pulmonary exam normal breath sounds clear to auscultation       Cardiovascular hypertension, + angina with exertion + CAD, + Past MI and +CHF  Normal cardiovascular exam Rhythm:regular Rate:Normal  01/03/20 Cards Note  1. Angina, recurrent, stable at this time, patient reports mild intermittent chest pain upon exertion, patient reports the pain is not great enough for her to take nitroglycerin but that it subsides with rest, recent cardiac MRI was abnormal and revealed abnormal perfusion per report as stated above.  -We will continue aspirin and Plavix therapy for anticoagulation.  -We will continue management with Imdur and Ranexa. -We will consider performing cardiac catheterization if symptoms worsen or persists.  2. Medication management, patient was recently started on Entresto in November 2020, the most recent laboratory work from September 2020 showed a BUN = 24 and creatinine = 0.90. -We will recheck CMP to evaluate for kidney function at this time due to the recent start of Entresto.  3. Hypertension, reasonably controlled, patient is normotensive today. -We will continue management with metoprolol, spironolactone and Entresto.  4. CAD, patient with known coronary artery disease confirmed by cardiac catheterization with PCI and stent placement to LAD and distal RCA, reasonably stable at this time, patient has known statin intolerance this is now on Praluent injections. -We will continue management with metoprolol, aspirin and Praluent. -We will continue anticoagulation with Plavix.  5. Chronic systolic  congestive heart failure, reasonably controlled, most recent EF confirmed by cardiac MRI is 41%, patient recently started on Entresto. -We will continue management with metoprolol, Lasix and spironolactone. -We will not increase Entresto dosage at this time. -We will check BNP today.  6. Hyperlipidemia, reasonably managed, most recent lipid profile revealed total cholesterol = 128, HDL = 43, LDL = 59, patient is intolerant to statin therapy. -We will continue management with Praluent injections.  7. Dyspnea on exertion, chronic, reasonably controlled at this time, patient reports that symptoms remain unchanged in severity. -We will continue management with Lasix and spironolactone.     Neuro/Psych    GI/Hepatic GERD  ,  Endo/Other  diabetes  Renal/GU      Musculoskeletal   Abdominal   Peds  Hematology   Anesthesia Other Findings   Reproductive/Obstetrics                            Anesthesia Physical Anesthesia Plan  ASA: III  Anesthesia Plan: MAC   Post-op Pain Management:    Induction:   PONV Risk Score and Plan: 2 and Treatment may vary due to age or medical condition, TIVA and Midazolam  Airway Management Planned:   Additional Equipment:   Intra-op Plan:   Post-operative Plan:   Informed Consent: I have reviewed the patients History and Physical, chart, labs and discussed the procedure including the risks, benefits and alternatives for the proposed anesthesia with the patient or authorized representative who has indicated his/her understanding and acceptance.     Dental Advisory Given  Plan Discussed with: CRNA  Anesthesia Plan Comments:         Anesthesia  Quick Evaluation

## 2020-04-29 ENCOUNTER — Other Ambulatory Visit: Payer: Self-pay

## 2020-04-29 ENCOUNTER — Other Ambulatory Visit
Admission: RE | Admit: 2020-04-29 | Discharge: 2020-04-29 | Disposition: A | Discharge status: Home / Self Care | Payer: Medicare Other | Source: Ambulatory Visit | Attending: Ophthalmology | Admitting: Ophthalmology

## 2020-04-29 DIAGNOSIS — Z01812 Encounter for preprocedural laboratory examination: Secondary | ICD-10-CM | POA: Diagnosis present

## 2020-04-29 DIAGNOSIS — Z20822 Contact with and (suspected) exposure to covid-19: Secondary | ICD-10-CM | POA: Diagnosis not present

## 2020-04-30 DIAGNOSIS — D2272 Melanocytic nevi of left lower limb, including hip: Secondary | ICD-10-CM | POA: Diagnosis not present

## 2020-04-30 DIAGNOSIS — D2261 Melanocytic nevi of right upper limb, including shoulder: Secondary | ICD-10-CM | POA: Diagnosis not present

## 2020-04-30 DIAGNOSIS — Z85828 Personal history of other malignant neoplasm of skin: Secondary | ICD-10-CM | POA: Diagnosis not present

## 2020-04-30 DIAGNOSIS — D2262 Melanocytic nevi of left upper limb, including shoulder: Secondary | ICD-10-CM | POA: Diagnosis not present

## 2020-04-30 DIAGNOSIS — D225 Melanocytic nevi of trunk: Secondary | ICD-10-CM | POA: Diagnosis not present

## 2020-04-30 LAB — SARS CORONAVIRUS 2 (TAT 6-24 HRS): SARS Coronavirus 2: NEGATIVE

## 2020-04-30 NOTE — Discharge Instructions (Signed)

## 2020-05-01 ENCOUNTER — Encounter: Payer: Self-pay | Admitting: Ophthalmology

## 2020-05-01 ENCOUNTER — Ambulatory Visit
Admission: RE | Admit: 2020-05-01 | Discharge: 2020-05-01 | Disposition: A | Discharge status: Home / Self Care | Payer: Medicare Other | Attending: Ophthalmology | Admitting: Ophthalmology

## 2020-05-01 ENCOUNTER — Other Ambulatory Visit: Payer: Self-pay

## 2020-05-01 ENCOUNTER — Ambulatory Visit: Payer: Medicare Other | Admitting: Anesthesiology

## 2020-05-01 ENCOUNTER — Encounter
Admission: RE | Disposition: A | Discharge status: Home / Self Care | Payer: Self-pay | Source: Home / Self Care | Attending: Ophthalmology

## 2020-05-01 DIAGNOSIS — J45909 Unspecified asthma, uncomplicated: Secondary | ICD-10-CM | POA: Diagnosis not present

## 2020-05-01 DIAGNOSIS — E119 Type 2 diabetes mellitus without complications: Secondary | ICD-10-CM | POA: Diagnosis not present

## 2020-05-01 DIAGNOSIS — I2510 Atherosclerotic heart disease of native coronary artery without angina pectoris: Secondary | ICD-10-CM | POA: Insufficient documentation

## 2020-05-01 DIAGNOSIS — K219 Gastro-esophageal reflux disease without esophagitis: Secondary | ICD-10-CM | POA: Diagnosis not present

## 2020-05-01 DIAGNOSIS — E785 Hyperlipidemia, unspecified: Secondary | ICD-10-CM | POA: Insufficient documentation

## 2020-05-01 DIAGNOSIS — I5022 Chronic systolic (congestive) heart failure: Secondary | ICD-10-CM | POA: Insufficient documentation

## 2020-05-01 DIAGNOSIS — Z79899 Other long term (current) drug therapy: Secondary | ICD-10-CM | POA: Diagnosis not present

## 2020-05-01 DIAGNOSIS — Z7982 Long term (current) use of aspirin: Secondary | ICD-10-CM | POA: Insufficient documentation

## 2020-05-01 DIAGNOSIS — I252 Old myocardial infarction: Secondary | ICD-10-CM | POA: Insufficient documentation

## 2020-05-01 DIAGNOSIS — Z7902 Long term (current) use of antithrombotics/antiplatelets: Secondary | ICD-10-CM | POA: Insufficient documentation

## 2020-05-01 DIAGNOSIS — H2512 Age-related nuclear cataract, left eye: Secondary | ICD-10-CM | POA: Diagnosis not present

## 2020-05-01 DIAGNOSIS — G4730 Sleep apnea, unspecified: Secondary | ICD-10-CM | POA: Diagnosis not present

## 2020-05-01 DIAGNOSIS — I110 Hypertensive heart disease with heart failure: Secondary | ICD-10-CM | POA: Diagnosis not present

## 2020-05-01 HISTORY — PX: CATARACT EXTRACTION W/PHACO: SHX586

## 2020-05-01 SURGERY — PHACOEMULSIFICATION, CATARACT, WITH IOL INSERTION
Anesthesia: Monitor Anesthesia Care | Site: Eye | Laterality: Left

## 2020-05-01 MED ORDER — FENTANYL CITRATE (PF) 100 MCG/2ML IJ SOLN
INTRAMUSCULAR | Status: DC | PRN
  Administered 2020-05-01 (×2): 50 ug via INTRAVENOUS

## 2020-05-01 MED ORDER — EPINEPHRINE PF 1 MG/ML IJ SOLN
INTRAOCULAR | Status: DC | PRN
  Administered 2020-05-01: 53 mL via OPHTHALMIC

## 2020-05-01 MED ORDER — ARMC OPHTHALMIC DILATING DROPS
1.0000 "application " | OPHTHALMIC | Status: DC | PRN
  Administered 2020-05-01 (×3): 1 via OPHTHALMIC

## 2020-05-01 MED ORDER — TETRACAINE HCL 0.5 % OP SOLN
1.0000 [drp] | OPHTHALMIC | Status: DC | PRN
  Administered 2020-05-01 (×3): 1 [drp] via OPHTHALMIC

## 2020-05-01 MED ORDER — BRIMONIDINE TARTRATE-TIMOLOL 0.2-0.5 % OP SOLN
OPHTHALMIC | Status: DC | PRN
  Administered 2020-05-01: 1 [drp] via OPHTHALMIC

## 2020-05-01 MED ORDER — ACETAMINOPHEN 325 MG PO TABS: 325.0000 mg | ORAL_TABLET | Freq: Once | ORAL | Status: DC

## 2020-05-01 MED ORDER — LIDOCAINE HCL (PF) 2 % IJ SOLN
INTRAOCULAR | Status: DC | PRN
  Administered 2020-05-01: 1 mL

## 2020-05-01 MED ORDER — ACETAMINOPHEN 160 MG/5ML PO SOLN: 325.0000 mg | Freq: Once | ORAL | Status: DC

## 2020-05-01 MED ORDER — MOXIFLOXACIN HCL 0.5 % OP SOLN
OPHTHALMIC | Status: DC | PRN
  Administered 2020-05-01: 0.2 mL via OPHTHALMIC

## 2020-05-01 MED ORDER — MOXIFLOXACIN HCL 0.5 % OP SOLN
1.0000 [drp] | OPHTHALMIC | Status: DC | PRN
  Administered 2020-05-01 (×3): 1 [drp] via OPHTHALMIC

## 2020-05-01 MED ORDER — NA HYALUR & NA CHOND-NA HYALUR 0.4-0.35 ML IO KIT
PACK | INTRAOCULAR | Status: DC | PRN
  Administered 2020-05-01: 1 mL via INTRAOCULAR

## 2020-05-01 MED ORDER — LACTATED RINGERS IV SOLN: 10.0000 mL/h | INTRAVENOUS | Status: DC

## 2020-05-01 MED ORDER — MIDAZOLAM HCL 2 MG/2ML IJ SOLN
INTRAMUSCULAR | Status: DC | PRN
  Administered 2020-05-01 (×2): 1 mg via INTRAVENOUS

## 2020-05-01 SURGICAL SUPPLY — 23 items
CANNULA ANT/CHMB 27G (MISCELLANEOUS) ×1 IMPLANT
CANNULA ANT/CHMB 27GA (MISCELLANEOUS) ×3 IMPLANT
GLOVE SURG LX 7.5 STRW (GLOVE) ×2
GLOVE SURG LX STRL 7.5 STRW (GLOVE) ×1 IMPLANT
GLOVE SURG TRIUMPH 8.0 PF LTX (GLOVE) ×3 IMPLANT
GOWN STRL REUS W/ TWL LRG LVL3 (GOWN DISPOSABLE) ×2 IMPLANT
GOWN STRL REUS W/TWL LRG LVL3 (GOWN DISPOSABLE) ×6
LENS IOL DIOP 24.5 (Intraocular Lens) ×3 IMPLANT
LENS IOL TECNIS MONO 24.5 (Intraocular Lens) IMPLANT
MARKER SKIN DUAL TIP RULER LAB (MISCELLANEOUS) ×3 IMPLANT
NDL CAPSULORHEX 25GA (NEEDLE) ×1 IMPLANT
NDL FILTER BLUNT 18X1 1/2 (NEEDLE) ×2 IMPLANT
NEEDLE CAPSULORHEX 25GA (NEEDLE) ×3 IMPLANT
NEEDLE FILTER BLUNT 18X 1/2SAF (NEEDLE) ×4
NEEDLE FILTER BLUNT 18X1 1/2 (NEEDLE) ×2 IMPLANT
PACK CATARACT BRASINGTON (MISCELLANEOUS) ×3 IMPLANT
PACK EYE AFTER SURG (MISCELLANEOUS) ×3 IMPLANT
PACK OPTHALMIC (MISCELLANEOUS) ×3 IMPLANT
SOLUTION OPHTHALMIC SALT (MISCELLANEOUS) ×3 IMPLANT
SYR 3ML LL SCALE MARK (SYRINGE) ×6 IMPLANT
SYR TB 1ML LUER SLIP (SYRINGE) ×3 IMPLANT
WATER STERILE IRR 250ML POUR (IV SOLUTION) ×3 IMPLANT
WIPE NON LINTING 3.25X3.25 (MISCELLANEOUS) ×3 IMPLANT

## 2020-05-01 NOTE — H&P (Signed)

## 2020-05-01 NOTE — Op Note (Signed)
OPERATIVE NOTE  Stacie Mcdonald [id number] 05/01/2020   PREOPERATIVE DIAGNOSIS:  Nuclear sclerotic cataract left eye. H25.12   POSTOPERATIVE DIAGNOSIS:    Nuclear sclerotic cataract left eye.     PROCEDURE:  Phacoemusification with posterior chamber intraocular lens placement of the left eye  Ultrasound time: Procedure(s) with comments: CATARACT EXTRACTION PHACO AND INTRAOCULAR LENS PLACEMENT (IOC) LEFT 5.45  00:48.6  11.2% (Left) - sleep apnea  LENS:   Implant Name Type Inv. Item Serial No. Manufacturer Lot No. LRB No. Used Action  LENS IOL DIOP 24.5 - [id number] Intraocular Lens LENS IOL DIOP 24.5 [id number] AMO  Left 1 Implanted      SURGEON:  Wyonia Hough, MD   ANESTHESIA:  Topical with tetracaine drops and 2% Xylocaine jelly, augmented with 1% preservative-free intracameral lidocaine.    COMPLICATIONS:  None.   DESCRIPTION OF PROCEDURE:  The patient was identified in the holding room and transported to the operating room and placed in the supine position under the operating microscope.  The left eye was identified as the operative eye and it was prepped and draped in the usual sterile ophthalmic fashion.   A 1 millimeter clear-corneal paracentesis was made at the 1:30 position.  0.5 ml of preservative-free 1% lidocaine was injected into the anterior chamber.  The anterior chamber was filled with Viscoat viscoelastic.  A 2.4 millimeter keratome was used to make a near-clear corneal incision at the 10:30 position.  .  A curvilinear capsulorrhexis was made with a cystotome and capsulorrhexis forceps.  Balanced salt solution was used to hydrodissect and hydrodelineate the nucleus.   Phacoemulsification was then used in stop and chop fashion to remove the lens nucleus and epinucleus.  The remaining cortex was then removed using the irrigation and aspiration handpiece. Provisc was then placed into the capsular bag to distend it for lens placement.  A lens was then injected  into the capsular bag.  The remaining viscoelastic was aspirated.   Wounds were hydrated with balanced salt solution.  The anterior chamber was inflated to a physiologic pressure with balanced salt solution.  No wound leaks were noted. Cefuroxime 0.1 ml of a 10mg /ml solution was injected into the anterior chamber for a dose of 1 mg of intracameral antibiotic at the completion of the case.   Timolol and Brimonidine drops were applied to the eye.  The patient was taken to the recovery room in stable condition without complications of anesthesia or surgery.  [name] 05/01/2020, 10:52 AM

## 2020-05-01 NOTE — Anesthesia Postprocedure Evaluation (Signed)
Anesthesia Post Note  Patient: Stacie Mcdonald  Procedure(s) Performed: CATARACT EXTRACTION PHACO AND INTRAOCULAR LENS PLACEMENT (IOC) LEFT 5.45  00:48.6  11.2% (Left Eye)     Patient location during evaluation: PACU Anesthesia Type: MAC Level of consciousness: awake and alert and oriented Pain management: satisfactory to patient Vital Signs Assessment: post-procedure vital signs reviewed and stable Respiratory status: spontaneous breathing, nonlabored ventilation and respiratory function stable Cardiovascular status: blood pressure returned to baseline and stable Postop Assessment: Adequate PO intake and No signs of nausea or vomiting Anesthetic complications: no   No complications documented.  Raliegh Ip

## 2020-05-01 NOTE — Anesthesia Procedure Notes (Signed)
Procedure Name: MAC Date/Time: 05/01/2020 10:34 AM Performed by: Vanetta Shawl, CRNA Pre-anesthesia Checklist: Patient identified, Emergency Drugs available, Suction available, Timeout performed and Patient being monitored Patient Re-evaluated:Patient Re-evaluated prior to induction Oxygen Delivery Method: Nasal cannula Placement Confirmation: positive ETCO2

## 2020-05-01 NOTE — Transfer of Care (Signed)
Immediate Anesthesia Transfer of Care Note  Patient: Stacie Mcdonald  Procedure(s) Performed: CATARACT EXTRACTION PHACO AND INTRAOCULAR LENS PLACEMENT (IOC) LEFT 5.45  00:48.6  11.2% (Left Eye)  Patient Location: PACU  Anesthesia Type: MAC  Level of Consciousness: awake, alert  and patient cooperative  Airway and Oxygen Therapy: Patient Spontanous Breathing   Post-op Assessment: Post-op Vital signs reviewed, Patient's Cardiovascular Status Stable, Respiratory Function Stable, Patent Airway and No signs of Nausea or vomiting  Post-op Vital Signs: Reviewed and stable  Complications: No complications documented.

## 2020-05-02 ENCOUNTER — Encounter: Payer: Self-pay | Admitting: Ophthalmology

## 2020-05-14 ENCOUNTER — Other Ambulatory Visit: Payer: Self-pay | Admitting: Internal Medicine

## 2020-06-03 DIAGNOSIS — M5416 Radiculopathy, lumbar region: Secondary | ICD-10-CM | POA: Diagnosis not present

## 2020-06-03 DIAGNOSIS — M4805 Spinal stenosis, thoracolumbar region: Secondary | ICD-10-CM | POA: Diagnosis not present

## 2020-06-03 DIAGNOSIS — M545 Low back pain: Secondary | ICD-10-CM | POA: Diagnosis not present

## 2020-06-05 DIAGNOSIS — M545 Low back pain: Secondary | ICD-10-CM | POA: Diagnosis not present

## 2020-06-05 DIAGNOSIS — M4805 Spinal stenosis, thoracolumbar region: Secondary | ICD-10-CM | POA: Diagnosis not present

## 2020-06-05 DIAGNOSIS — M5416 Radiculopathy, lumbar region: Secondary | ICD-10-CM | POA: Diagnosis not present

## 2020-06-07 DIAGNOSIS — M4805 Spinal stenosis, thoracolumbar region: Secondary | ICD-10-CM | POA: Diagnosis not present

## 2020-06-07 DIAGNOSIS — M545 Low back pain: Secondary | ICD-10-CM | POA: Diagnosis not present

## 2020-06-07 DIAGNOSIS — M5416 Radiculopathy, lumbar region: Secondary | ICD-10-CM | POA: Diagnosis not present

## 2020-06-10 DIAGNOSIS — M5416 Radiculopathy, lumbar region: Secondary | ICD-10-CM | POA: Diagnosis not present

## 2020-06-10 DIAGNOSIS — M4805 Spinal stenosis, thoracolumbar region: Secondary | ICD-10-CM | POA: Diagnosis not present

## 2020-06-10 DIAGNOSIS — M545 Low back pain: Secondary | ICD-10-CM | POA: Diagnosis not present

## 2020-06-17 ENCOUNTER — Telehealth: Payer: Medicare Other

## 2020-06-17 ENCOUNTER — Telehealth: Payer: Self-pay | Admitting: Pharmacist

## 2020-06-17 DIAGNOSIS — M545 Low back pain: Secondary | ICD-10-CM | POA: Diagnosis not present

## 2020-06-17 DIAGNOSIS — M5416 Radiculopathy, lumbar region: Secondary | ICD-10-CM | POA: Diagnosis not present

## 2020-06-17 DIAGNOSIS — M4805 Spinal stenosis, thoracolumbar region: Secondary | ICD-10-CM | POA: Diagnosis not present

## 2020-06-17 NOTE — Telephone Encounter (Signed)
  Chronic Care Management   Note  06/17/2020 Name: Stacie Mcdonald MRN: [id number] DOB: [birth year]   Attempted to contact patient for scheduled appointment for medication management support. Left HIPAA compliant message for patient to return my call at their convenience.    Plan: - If I do not hear back from the patient by end of business today, will collaborate with Care Guide to outreach to schedule follow up with me  Catie Darnelle Maffucci, PharmD, [place], [place] Pharmacist [place] [phone]

## 2020-06-19 DIAGNOSIS — M5416 Radiculopathy, lumbar region: Secondary | ICD-10-CM | POA: Diagnosis not present

## 2020-06-19 DIAGNOSIS — M4805 Spinal stenosis, thoracolumbar region: Secondary | ICD-10-CM | POA: Diagnosis not present

## 2020-06-19 DIAGNOSIS — M545 Low back pain: Secondary | ICD-10-CM | POA: Diagnosis not present

## 2020-06-19 NOTE — Telephone Encounter (Signed)
Pt has been r/s for 06/28/2020

## 2020-06-20 DIAGNOSIS — M545 Low back pain: Secondary | ICD-10-CM | POA: Diagnosis not present

## 2020-06-20 DIAGNOSIS — M4805 Spinal stenosis, thoracolumbar region: Secondary | ICD-10-CM | POA: Diagnosis not present

## 2020-06-20 DIAGNOSIS — M5416 Radiculopathy, lumbar region: Secondary | ICD-10-CM | POA: Diagnosis not present

## 2020-06-25 DIAGNOSIS — M545 Low back pain: Secondary | ICD-10-CM | POA: Diagnosis not present

## 2020-06-25 DIAGNOSIS — M5416 Radiculopathy, lumbar region: Secondary | ICD-10-CM | POA: Diagnosis not present

## 2020-06-25 DIAGNOSIS — M4805 Spinal stenosis, thoracolumbar region: Secondary | ICD-10-CM | POA: Diagnosis not present

## 2020-06-28 ENCOUNTER — Ambulatory Visit (INDEPENDENT_AMBULATORY_CARE_PROVIDER_SITE_OTHER): Payer: Medicare Other | Admitting: Pharmacist

## 2020-06-28 DIAGNOSIS — E785 Hyperlipidemia, unspecified: Secondary | ICD-10-CM | POA: Diagnosis not present

## 2020-06-28 DIAGNOSIS — M791 Myalgia, unspecified site: Secondary | ICD-10-CM

## 2020-06-28 DIAGNOSIS — I10 Essential (primary) hypertension: Secondary | ICD-10-CM | POA: Diagnosis not present

## 2020-06-28 DIAGNOSIS — I25118 Atherosclerotic heart disease of native coronary artery with other forms of angina pectoris: Secondary | ICD-10-CM

## 2020-06-28 DIAGNOSIS — T466X5A Adverse effect of antihyperlipidemic and antiarteriosclerotic drugs, initial encounter: Secondary | ICD-10-CM

## 2020-06-28 NOTE — Chronic Care Management (AMB) (Signed)
Chronic Care Management   Follow Up Note   06/28/2020 Name: Stacie Mcdonald MRN: [id number] DOB: Jun 08, 1946  Referred by: Crecencio Mc, MD Reason for referral : Chronic Care Management (Medication Management)   Stacie Mcdonald is a 74 y.o. year old female who is a primary care patient of Tullo, Aris Everts, MD. The CCM team was consulted for assistance with chronic disease management and care coordination needs.    Contacted patient for medication management review.  Review of patient status, including review of consultants reports, relevant laboratory and other test results, and collaboration with appropriate care team members and the patient's provider was performed as part of comprehensive patient evaluation and provision of chronic care management services.    SDOH (Social Determinants of Health) assessments performed: Yes See Care Plan activities for detailed interventions related to SDOH)  SDOH Interventions     Most Recent Value  SDOH Interventions  Financial Strain Interventions Intervention Not Indicated       Outpatient Encounter Medications as of 06/28/2020  Medication Sig Note  . Ascorbic Acid (VITAMIN C PO) Take 400 mg by mouth daily.   Marland Kitchen aspirin 81 MG tablet Take 81 mg by mouth daily.    . Cholecalciferol (VITAMIN D) 125 MCG (5000 UT) CAPS Take by mouth daily.   . clopidogrel (PLAVIX) 75 MG tablet TAKE ONE TABLET BY MOUTH DAILY   . D-MANNOSE PO Take 800 mg by mouth daily.   Marland Kitchen ELDERBERRY PO Take 1,000 mg by mouth daily.   Marland Kitchen ENTRESTO 24-26 MG Take 1 tablet by mouth 2 (two) times daily.   . furosemide (LASIX) 40 MG tablet Take 40 mg by mouth daily. 12/07/2019: Takes 40 mg usually, can take 80 mg as needed  . gabapentin (NEURONTIN) 100 MG capsule Take 2 capsules (200 mg total) by mouth 3 (three) times daily. 06/28/2020: 200 mg BID most days  . GLUCOSAMINE-CHONDROITIN PO Take 2 tablets by mouth daily.   . isosorbide mononitrate (IMDUR) 120 MG 24 hr tablet Take 120 mg by  mouth daily.    . Lancets (FREESTYLE) lancets uSE TO CHECK SUGARS ONCE DAILY e11.9   . loratadine (CLARITIN) 10 MG tablet Take 10 mg by mouth daily as needed for allergies.   . metoprolol (TOPROL-XL) 200 MG 24 hr tablet TAKE 1 TABLET BY MOUTH DAILY.   Marland Kitchen omeprazole (PRILOSEC) 20 MG capsule Take 1 capsule (20 mg total) by mouth daily. (Patient taking differently: Take 20 mg by mouth daily as needed (acid reflux). )   . ONE TOUCH ULTRA TEST test strip CHECK DAILY   . OVER THE COUNTER MEDICATION 50 mg daily. Blood sugar support with Pyerogel   . PRALUENT 75 MG/ML SOAJ every 14 (fourteen) days.    . ranolazine (RANEXA) 500 MG 12 hr tablet Take 500 mg by mouth 2 (two) times daily.   Marland Kitchen spironolactone (ALDACTONE) 25 MG tablet Take 1 tablet (25 mg total) by mouth daily.   . Zinc 10 MG LOZG Use as directed in the mouth or throat daily.   . nitroGLYCERIN (NITROSTAT) 0.4 MG SL tablet Place 0.4 mg under the tongue every 5 (five) minutes as needed for chest pain.   . [DISCONTINUED] tizanidine (ZANAFLEX) 2 MG capsule Take 1 capsule (2 mg total) by mouth 3 (three) times daily. (Patient not taking: Reported on 06/28/2020)    No facility-administered encounter medications on file as of 06/28/2020.     Objective:   Goals Addressed  This Visit's Progress     Patient Stated   .  COMPLETED: PharmD "I want to stay healthy" (pt-stated)        Current Barriers:  . Social, community, and financial barriers:  o Reports that she is in the [employer]. Thinks she should pay though soon, though. Unfortunately, she is over income for assistance with both Praluent and Entresto. . Polypharmacy; complex patient with multiple comorbidities including HFrEF (last EF 47%), CAD (last stent placement 03/2018, continued occasional angina w/ exertion), T2DM o HFrEF: follows w/ [facility] Dr. Edwin Dada, Dr. Clayborn Bigness; Entresto 24/26 mg BID, metoprolol succinate 200 mg daily, spironolactone 25 mg daily,  furosemide 40 mg daily, can take up to 80 PRN - Does weigh daily. Notes she has "gained a few pounds" over the past year of COVID, but no acute weight gain.  - Does check BP at home. Generally 120-130s/70s o CAD/ASCVD risk reduction: statin intolerant, on Praluent 75 mg Q14 days, LDL at goal <70; ASA 81 mg daily, clopidogrel 75 mg daily, isosorbide mononitrate ER 120 mg daily, ranolazine 500 mg BID o T2DM: diet controlled, last A1C 6.4% - Exercising regularly now. Going 4-5 times weekly to [employer] class; dance, exercise bands, weights. Also participates in PT, uses stepping machine - Occasionally checks blood glucose at home, remaining at goal per her report - Eye exam: Due, reports she has an appt in the next few weeks which would be a DM eye exam o Sciatic pain: gabapentin 200 mg up to TID, usually using BID. Reports benefit recently w/ PT and dry needling o GERD: omeprazole 20 mg daily o Allergies: cetirizine 10 mg PRN  Pharmacist Clinical Goal(s):  Marland Kitchen Over the next 90 days, patient will work with PharmD and provider towards optimized medication management  Interventions: . Comprehensive medication review performed, medication list updated in electronic medical record . Inter-disciplinary care team collaboration (see longitudinal plan of care) . Reiterated that she is overdue for f/u with PCP. Reviewed upcoming AWV. Patient noted she would call the office and schedule f/u with Dr. Derrel Nip at her convenience. . Discussed goal A1c. Encouraged continued focus on activity to maintain diet- control of DM.  Marland Kitchen Encouraged continued daily weights. Reviewed to call cardiology for weight gain >3 lb overnight or >5 lb in a day . Denies any other questions or concerns at this time.  Patient Self Care Activities:  . Patient will take medications as prescribed  Please see past updates related to this goal by clicking on the "Past Updates" button in the selected goal          Plan:  -  Patient denies any questions or concerns at this time. She has my contact information for future pharmacy needs. Closing CCM case at this time.   Catie Darnelle Maffucci, PharmD, [place], CPP Clinical Pharmacist [place] [phone]

## 2020-06-28 NOTE — Patient Instructions (Signed)
Visit Information  Goals Addressed              This Visit's Progress     Patient Stated   .  COMPLETED: PharmD "I want to stay healthy" (pt-stated)        Current Barriers:  . Social, community, and financial barriers:  o Reports that she is in the [employer]. Thinks she should pay though soon, though. Unfortunately, she is over income for assistance with both Praluent and Entresto. . Polypharmacy; complex patient with multiple comorbidities including HFrEF (last EF 47%), CAD (last stent placement 03/2018, continued occasional angina w/ exertion), T2DM o HFrEF: follows w/ [facility] Dr. Edwin Dada, Dr. Clayborn Bigness; Entresto 24/26 mg BID, metoprolol succinate 200 mg daily, spironolactone 25 mg daily, furosemide 40 mg daily, can take up to 80 PRN - Does weigh daily. Notes she has "gained a few pounds" over the past year of COVID, but no acute weight gain.  - Does check BP at home. Generally 120-130s/70s o CAD/ASCVD risk reduction: statin intolerant, on Praluent 75 mg Q14 days, LDL at goal <70; ASA 81 mg daily, clopidogrel 75 mg daily, isosorbide mononitrate ER 120 mg daily, ranolazine 500 mg BID o T2DM: diet controlled, last A1C 6.4% - Exercising regularly now. Going 4-5 times weekly to [employer] class; dance, exercise bands, weights. Also participates in PT, uses stepping machine - Occasionally checks blood glucose at home, remaining at goal per her report - Eye exam: Due, reports she has an appt in the next few weeks which would be a DM eye exam o Sciatic pain: gabapentin 200 mg up to TID, usually using BID. Reports benefit recently w/ PT and dry needling o GERD: omeprazole 20 mg daily o Allergies: cetirizine 10 mg PRN  Pharmacist Clinical Goal(s):  Marland Kitchen Over the next 90 days, patient will work with PharmD and provider towards optimized medication management  Interventions: . Comprehensive medication review performed, medication list updated in electronic medical  record . Inter-disciplinary care team collaboration (see longitudinal plan of care) . Reiterated that she is overdue for f/u with PCP. Reviewed upcoming AWV. Patient noted she would call the office and schedule f/u with Dr. Derrel Nip at her convenience. . Discussed goal A1c. Encouraged continued focus on activity to maintain diet- control of DM.  Marland Kitchen Encouraged continued daily weights. Reviewed to call cardiology for weight gain >3 lb overnight or >5 lb in a day . Denies any other questions or concerns at this time.  Patient Self Care Activities:  . Patient will take medications as prescribed  Please see past updates related to this goal by clicking on the "Past Updates" button in the selected goal         The patient verbalized understanding of instructions provided today and declined a print copy of patient instruction materials.   Plan:  - Patient denies any questions or concerns at this time. She has my contact information for future pharmacy needs. Closing CCM case at this time.   Catie Darnelle Maffucci, PharmD, [place], CPP Clinical Pharmacist [place] [phone]

## 2020-07-02 DIAGNOSIS — M545 Low back pain: Secondary | ICD-10-CM | POA: Diagnosis not present

## 2020-07-02 DIAGNOSIS — M5416 Radiculopathy, lumbar region: Secondary | ICD-10-CM | POA: Diagnosis not present

## 2020-07-02 DIAGNOSIS — M4805 Spinal stenosis, thoracolumbar region: Secondary | ICD-10-CM | POA: Diagnosis not present

## 2020-07-23 DIAGNOSIS — E113393 Type 2 diabetes mellitus with moderate nonproliferative diabetic retinopathy without macular edema, bilateral: Secondary | ICD-10-CM | POA: Diagnosis not present

## 2020-07-30 ENCOUNTER — Ambulatory Visit (INDEPENDENT_AMBULATORY_CARE_PROVIDER_SITE_OTHER): Payer: Medicare Other

## 2020-07-30 VITALS — Ht 64.0 in | Wt 182.0 lb

## 2020-07-30 DIAGNOSIS — Z0000 Encounter for general adult medical examination without abnormal findings: Secondary | ICD-10-CM | POA: Diagnosis not present

## 2020-07-30 NOTE — Patient Instructions (Addendum)
Stacie Mcdonald , Thank you for taking time to come for your Medicare Wellness Visit. I appreciate your ongoing commitment to your health goals. Please review the following plan we discussed and let me know if I can assist you in the future.   These are the goals we discussed: Goals    . Follow up with provider as needed    . Maintain Healthy Lifestyle       This is a list of the screening recommended for you and due dates:  Health Maintenance  Topic Date Due  . Complete foot exam   09/10/2017  . Hemoglobin A1C  01/30/2020  . Urine Protein Check  08/01/2020  . COVID-19 Vaccine (1) 08/15/2020*  . Flu Shot  02/13/2021*  . Tetanus Vaccine  07/30/2021*  . Eye exam for diabetics  07/23/2021  . Colon Cancer Screening  08/17/2024  . DEXA scan (bone density measurement)  Completed  .  Hepatitis C: One time screening is recommended by Center for Disease Control  (CDC) for  adults born from [birth year] through 1966.   Completed  . Pneumonia vaccines  Completed  *Topic was postponed. The date shown is not the original due date.   Immunizations Immunization History  Administered Date(s) Administered  . Influenza Split 09/03/2013, 08/01/2014  . Influenza, High Dose Seasonal PF 09/03/2017, 07/27/2018, 07/27/2019  . Influenza-Unspecified 08/16/2012  . Pneumococcal Conjugate-13 02/13/2015  . Pneumococcal Polysaccharide-23 12/05/2009, 06/08/2017  . Td 03/13/2010  . Tdap 12/06/2003  . Zoster Recombinat (Shingrix) 08/26/2018, 11/30/2018   Advanced directives: End of life planning; Advance aging; Advanced directives discussed.  Copy of current HCPOA/Living Will requested.    Conditions/risks identified: none new.  Follow up in one year for your annual wellness visit.   Preventive Care 1 Years and Older, Female Preventive care refers to lifestyle choices and visits with your health care provider that can promote health and wellness. What does preventive care include?  A yearly physical exam. This  is also called an annual well check.  Dental exams once or twice a year.  Routine eye exams. Ask your health care provider how often you should have your eyes checked.  Personal lifestyle choices, including:  Daily care of your teeth and gums.  Regular physical activity.  Eating a healthy diet.  Avoiding tobacco and drug use.  Limiting alcohol use.  Practicing safe sex.  Taking low-dose aspirin every day.  Taking vitamin and mineral supplements as recommended by your health care provider. What happens during an annual well check? The services and screenings done by your health care provider during your annual well check will depend on your age, overall health, lifestyle risk factors, and family history of disease. Counseling  Your health care provider may ask you questions about your:  Alcohol use.  Tobacco use.  Drug use.  Emotional well-being.  Home and relationship well-being.  Sexual activity.  Eating habits.  History of falls.  Memory and ability to understand (cognition).  Work and work Statistician.  Reproductive health. Screening  You may have the following tests or measurements:  Height, weight, and BMI.  Blood pressure.  Lipid and cholesterol levels. These may be checked every 5 years, or more frequently if you are over 28 years old.  Skin check.  Lung cancer screening. You may have this screening every year starting at age 79 if you have a 30-pack-year history of smoking and currently smoke or have quit within the past 15 years.  Fecal occult blood test (FOBT) of  the stool. You may have this test every year starting at age 67.  Flexible sigmoidoscopy or colonoscopy. You may have a sigmoidoscopy every 5 years or a colonoscopy every 10 years starting at age 70.  Hepatitis C blood test.  Hepatitis B blood test.  Sexually transmitted disease (STD) testing.  Diabetes screening. This is done by checking your blood sugar (glucose) after you  have not eaten for a while (fasting). You may have this done every 1-3 years.  Bone density scan. This is done to screen for osteoporosis. You may have this done starting at age 62.  Mammogram. This may be done every 1-2 years. Talk to your health care provider about how often you should have regular mammograms. Talk with your health care provider about your test results, treatment options, and if necessary, the need for more tests. Vaccines  Your health care provider may recommend certain vaccines, such as:  Influenza vaccine. This is recommended every year.  Tetanus, diphtheria, and acellular pertussis (Tdap, Td) vaccine. You may need a Td booster every 10 years.  Zoster vaccine. You may need this after age 67.  Pneumococcal 13-valent conjugate (PCV13) vaccine. One dose is recommended after age 75.  Pneumococcal polysaccharide (PPSV23) vaccine. One dose is recommended after age 71. Talk to your health care provider about which screenings and vaccines you need and how often you need them. This information is not intended to replace advice given to you by your health care provider. Make sure you discuss any questions you have with your health care provider. Document Released: 11/29/2015 Document Revised: 07/22/2016 Document Reviewed: 09/03/2015 Elsevier Interactive Patient Education  2017 [place] Prevention in the Home Falls can cause injuries. They can happen to people of all ages. There are many things you can do to make your home safe and to help prevent falls. What can I do on the outside of my home?  Regularly fix the edges of walkways and driveways and fix any cracks.  Remove anything that might make you trip as you walk through a door, such as a raised step or threshold.  Trim any bushes or trees on the path to your home.  Use bright outdoor lighting.  Clear any walking paths of anything that might make someone trip, such as rocks or tools.  Regularly check to  see if handrails are loose or broken. Make sure that both sides of any steps have handrails.  Any raised decks and porches should have guardrails on the edges.  Have any leaves, snow, or ice cleared regularly.  Use sand or salt on walking paths during winter.  Clean up any spills in your garage right away. This includes oil or grease spills. What can I do in the bathroom?  Use night lights.  Install grab bars by the toilet and in the tub and shower. Do not use towel bars as grab bars.  Use non-skid mats or decals in the tub or shower.  If you need to sit down in the shower, use a plastic, non-slip stool.  Keep the floor dry. Clean up any water that spills on the floor as soon as it happens.  Remove soap buildup in the tub or shower regularly.  Attach bath mats securely with double-sided non-slip rug tape.  Do not have throw rugs and other things on the floor that can make you trip. What can I do in the bedroom?  Use night lights.  Make sure that you have a light by your  bed that is easy to reach.  Do not use any sheets or blankets that are too big for your bed. They should not hang down onto the floor.  Have a firm chair that has side arms. You can use this for support while you get dressed.  Do not have throw rugs and other things on the floor that can make you trip. What can I do in the kitchen?  Clean up any spills right away.  Avoid walking on wet floors.  Keep items that you use a lot in easy-to-reach places.  If you need to reach something above you, use a strong step stool that has a grab bar.  Keep electrical cords out of the way.  Do not use floor polish or wax that makes floors slippery. If you must use wax, use non-skid floor wax.  Do not have throw rugs and other things on the floor that can make you trip. What can I do with my stairs?  Do not leave any items on the stairs.  Make sure that there are handrails on both sides of the stairs and use  them. Fix handrails that are broken or loose. Make sure that handrails are as long as the stairways.  Check any carpeting to make sure that it is firmly attached to the stairs. Fix any carpet that is loose or worn.  Avoid having throw rugs at the top or bottom of the stairs. If you do have throw rugs, attach them to the floor with carpet tape.  Make sure that you have a light switch at the top of the stairs and the bottom of the stairs. If you do not have them, ask someone to add them for you. What else can I do to help prevent falls?  Wear shoes that:  Do not have high heels.  Have rubber bottoms.  Are comfortable and fit you well.  Are closed at the toe. Do not wear sandals.  If you use a stepladder:  Make sure that it is fully opened. Do not climb a closed stepladder.  Make sure that both sides of the stepladder are locked into place.  Ask someone to hold it for you, if possible.  Clearly mark and make sure that you can see:  Any grab bars or handrails.  First and last steps.  Where the edge of each step is.  Use tools that help you move around (mobility aids) if they are needed. These include:  Canes.  Walkers.  Scooters.  Crutches.  Turn on the lights when you go into a dark area. Replace any light bulbs as soon as they burn out.  Set up your furniture so you have a clear path. Avoid moving your furniture around.  If any of your floors are uneven, fix them.  If there are any pets around you, be aware of where they are.  Review your medicines with your doctor. Some medicines can make you feel dizzy. This can increase your chance of falling. Ask your doctor what other things that you can do to help prevent falls. This information is not intended to replace advice given to you by your health care provider. Make sure you discuss any questions you have with your health care provider. Document Released: 08/29/2009 Document Revised: 04/09/2016 Document Reviewed:  12/07/2014 Elsevier Interactive Patient Education  2017 [employer].

## 2020-07-30 NOTE — Progress Notes (Addendum)
Subjective:   Stacie Mcdonald is a 74 y.o. female who presents for Medicare Annual (Subsequent) preventive examination.  Review of Systems    No ROS.  Medicare Wellness Virtual Visit.    Cardiac Risk Factors include: advanced age (>1men, >5 women);diabetes mellitus;hypertension     Objective:    Today's Vitals   07/30/20 1238  Weight: 182 lb (82.6 kg)  Height: 5\' 4"  (1.626 m)   Body mass index is 31.24 kg/m.  Advanced Directives 07/30/2020 05/01/2020 08/18/2019 07/28/2019 06/09/2018 05/12/2018 04/07/2018  Does Patient Have a Medical Advance Directive? Yes Yes Yes Yes Yes Yes Yes  Type of Paramedic of [place];Living will [place];Living will [place];Living will [place];Living will [place];Living will - [place];Living will  Does patient want to make changes to medical advance directive? No - Patient declined No - Patient declined No - Guardian declined No - Patient declined No - Patient declined No - Patient declined No - Patient declined  Copy of [place] in Chart? No - copy requested No - copy requested No - copy requested No - copy requested No - copy requested - No - copy requested    Current Medications (verified) Outpatient Encounter Medications as of 07/30/2020  Medication Sig   Ascorbic Acid (VITAMIN C PO) Take 400 mg by mouth daily.   aspirin 81 MG tablet Take 81 mg by mouth daily.    Cholecalciferol (VITAMIN D) 125 MCG (5000 UT) CAPS Take by mouth daily.   clopidogrel (PLAVIX) 75 MG tablet TAKE ONE TABLET BY MOUTH DAILY   D-MANNOSE PO Take 800 mg by mouth daily.   ELDERBERRY PO Take 1,000 mg by mouth daily.   ENTRESTO 24-26 MG Take 1 tablet by mouth 2 (two) times daily.   furosemide (LASIX) 40 MG tablet Take 40 mg by mouth daily.   gabapentin (NEURONTIN) 100 MG capsule Take 2 capsules (200 mg total) by mouth 3 (three) times  daily.   GLUCOSAMINE-CHONDROITIN PO Take 2 tablets by mouth daily.   isosorbide mononitrate (IMDUR) 120 MG 24 hr tablet Take 120 mg by mouth daily.    Lancets (FREESTYLE) lancets uSE TO CHECK SUGARS ONCE DAILY e11.9   loratadine (CLARITIN) 10 MG tablet Take 10 mg by mouth daily as needed for allergies.   metoprolol (TOPROL-XL) 200 MG 24 hr tablet TAKE 1 TABLET BY MOUTH DAILY.   nitroGLYCERIN (NITROSTAT) 0.4 MG SL tablet Place 0.4 mg under the tongue every 5 (five) minutes as needed for chest pain.   omeprazole (PRILOSEC) 20 MG capsule Take 1 capsule (20 mg total) by mouth daily. (Patient taking differently: Take 20 mg by mouth daily as needed (acid reflux). )   ONE TOUCH ULTRA TEST test strip CHECK DAILY   OVER THE COUNTER MEDICATION 50 mg daily. Blood sugar support with Pyerogel   PRALUENT 75 MG/ML SOAJ every 14 (fourteen) days.    ranolazine (RANEXA) 500 MG 12 hr tablet Take 500 mg by mouth 2 (two) times daily.   spironolactone (ALDACTONE) 25 MG tablet Take 1 tablet (25 mg total) by mouth daily.   Zinc 10 MG LOZG Use as directed in the mouth or throat daily.   No facility-administered encounter medications on file as of 07/30/2020.    Allergies (verified) Penicillins and Statins   History: Past Medical History:  Diagnosis Date   Allergy    Arthritis    fingers, knees  Asthma    Breast Cancer 1991   R mastectomy. XRT , chemo   Breast cancer ([place]) 1991   RT MASTECTOMY   CAD (coronary artery disease) 2008   50% LAD occlusion 2009,  stent in mid LAD 2008 for 95%    Chicken pox    Colon polyps April 2009   by Colonoscopy, [place]   GERD (gastroesophageal reflux disease)    Heart disease    Hyperlipidemia    Hypertension    Myocardial infarction ([place])    silent. 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   T score -2.1   Rheumatic fever    Sleep apnea    CPAP   Stable angina ([place]) 07/27/2019   Urinary tract infection    Wears contact lenses      left only   Past Surgical History:  Procedure Laterality Date   BREAST BIOPSY     right breast reconstruction S/P mastectomy   BREAST SURGERY     right mastectomy   CATARACT EXTRACTION W/PHACO Left 05/01/2020   Procedure: CATARACT EXTRACTION PHACO AND INTRAOCULAR LENS PLACEMENT (IOC) LEFT 5.45  00:48.6  11.2%;  Surgeon: Leandrew Koyanagi, MD;  Location: [facility];  Service: Ophthalmology;  Laterality: Left;  sleep apnea   COLONOSCOPY WITH PROPOFOL N/A 08/18/2019   Procedure: COLONOSCOPY WITH PROPOFOL;  Surgeon: [name], MD;  Location: [facility];  Service: Endoscopy;  Laterality: N/A;  sleep apnea   CORONARY ANGIOPLASTY WITH STENT PLACEMENT  Jan 2013   mid RCA 75% occlusion resolved to 0%, [place]   CORONARY STENT INTERVENTION N/A 04/07/2018   Procedure: CORONARY STENT INTERVENTION;  Surgeon: Yolonda Kida, MD;  Location: [facility];  Service: Cardiovascular;  Laterality: N/A;   MASTECTOMY Right 1991   BREAST CA   POLYPECTOMY  08/18/2019   Procedure: POLYPECTOMY;  Surgeon: [name], MD;  Location: [facility];  Service: Endoscopy;;   RIGHT/LEFT HEART CATH AND CORONARY ANGIOGRAPHY N/A 04/07/2018   Procedure: RIGHT/LEFT HEART CATH AND CORONARY ANGIOGRAPHY;  Surgeon: Yolonda Kida, MD;  Location: [facility];  Service: Cardiovascular;  Laterality: N/A;   TONSILLECTOMY     Family History  Problem Relation Age of Onset   Heart disease Mother 67       massive MI   Heart disease Father 50       AMI   Cancer Father        metastatic lung CA   Diabetes Sister    Cancer Maternal Aunt    Breast cancer Maternal Aunt 50   Breast cancer Cousin        46s   Breast cancer Cousin        47s   Social History   Socioeconomic History   Marital status: Married    Spouse name: Blondell Reveal    Number of children: 4   Years of education: bachelors   Highest education level: Not on file  Occupational History   Occupation:  RETIRED  Tobacco Use   Smoking status: Never Smoker   Smokeless tobacco: Never Used  Vaping Use   Vaping Use: Never used  Substance and Sexual Activity   Alcohol use: Yes    Alcohol/week: 1.0 standard drink    Types: 1 Glasses of wine per week    Comment: occasional wine   Drug use: No   Sexual activity: Yes  Other Topics Concern   Not on file  Social History Narrative  Not on file   Social Determinants of Health   Financial Resource Strain: Low Risk    Difficulty of Paying Living Expenses: Not hard at all  Food Insecurity: No Food Insecurity   Worried About Charity fundraiser in the Last Year: Never true   [place] in the Last Year: Never true  Transportation Needs: No Transportation Needs   Lack of Transportation (Medical): No   Lack of Transportation (Non-Medical): No  Physical Activity: Sufficiently Active   Days of Exercise per Week: 4 days   Minutes of Exercise per Session: 60 min  Stress: No Stress Concern Present   Feeling of Stress : Not at all  Social Connections: Unknown   Frequency of Communication with Friends and Family: Not on file   Frequency of Social Gatherings with Friends and Family: Not on file   Attends Religious Services: Not on Electrical engineer or Organizations: Not on file   Attends Archivist Meetings: Not on file   Marital Status: Married    Tobacco Counseling Counseling given: Not Answered   Clinical Intake:  Pre-visit preparation completed: Yes        Diabetes: Yes (Followed by pcp)  How often do you need to have someone help you when you read instructions, pamphlets, or other written materials from your doctor or pharmacy?: 1 - Never   Interpreter Needed?: No      Activities of Daily Living In your present state of health, do you have any difficulty performing the following activities: 07/30/2020 05/01/2020  Hearing? N N  Vision? N N  Difficulty concentrating or making decisions? N N    Walking or climbing stairs? Y N  Comment SOBOE -  Dressing or bathing? N N  Doing errands, shopping? N -  Preparing Food and eating ? N -  Using the Toilet? N -  In the past six months, have you accidently leaked urine? N -  Do you have problems with loss of bowel control? N -  Managing your Medications? N -  Managing your Finances? Y -  Comment husband assist -  Some recent data might be hidden    Patient Care Team: Crecencio Mc, MD as PCP - General (Internal Medicine)  Indicate any recent Medical Services you may have received from other than [facility] providers in the past year (date may be approximate).     Assessment:   This is a routine wellness examination for Enza.  I connected with Jannetta today by telephone and verified that I am speaking with the correct person using two identifiers. Location patient: home Location provider: work Persons participating in the virtual visit: patient, Marine scientist.    I discussed the limitations, risks, security and privacy concerns of performing an evaluation and management service by telephone and the availability of in person appointments. The patient expressed understanding and verbally consented to this telephonic visit.    Interactive audio and video telecommunications were attempted between this provider and patient, however failed, due to patient having technical difficulties OR patient did not have access to video capability.  We continued and completed visit with audio only.  Some vital signs may be absent or patient reported.   Hearing/Vision screen  Hearing Screening   125Hz  250Hz  500Hz  1000Hz  2000Hz  3000Hz  4000Hz  6000Hz  8000Hz   Right ear:           Left ear:           Comments: Patient is able  to hear conversational tones without difficulty.  No issues reported.  Vision Screening Comments: Cataract extraction, bilateral Visual acuity not assessed, virtual visit.  They have seen their ophthalmologist in the last 12 months.    Dietary issues and exercise activities discussed: Current Exercise Habits: Home exercise routine, Type of exercise: calisthenics, Time (Minutes): 60, Frequency (Times/Week): 4, Weekly Exercise (Minutes/Week): 240, Intensity: Mild  Goals      Follow up with provider as needed     Maintain Healthy Lifestyle       Depression Screen PHQ 2/9 Scores 07/30/2020 12/08/2019 07/28/2019 06/09/2018 05/12/2018 01/06/2018 06/08/2017  PHQ - 2 Score 0 0 0 0 0 0 0  PHQ- 9 Score - 0 - - 3 - 0    Fall Risk Fall Risk  07/30/2020 07/28/2019 07/28/2019 06/09/2018 05/12/2018  Falls in the past year? 0 0 0 No No  Number falls in past yr: 0 - - - -  Follow up Falls evaluation completed Falls evaluation completed - - -   Handrails in use when climbing stairs? Yes Home free of loose throw rugs in walkways, pet beds, electrical cords, etc? Yes  Adequate lighting in your home to reduce risk of falls? Yes   ASSISTIVE DEVICES UTILIZED TO PREVENT FALLS: Life alert? No  Use of a cane, walker or w/c? No  Grab bars in the bathroom? No  Shower chair or bench in shower? No  Elevated toilet seat or a handicapped toilet? No   TIMED UP AND GO:  Was the test performed? No . Virtual visit.    Cognitive Function: Patient is alert and oriented x3. Denies difficulty focusing, making decisions, memory loss.  Enjoys reading for brain health.   MMSE - Mini Mental State Exam 06/09/2018 06/08/2017  Orientation to time 5 5  Orientation to Place 5 5  Registration 3 3  Attention/ Calculation 5 5  Recall 3 3  Language- name 2 objects 2 2  Language- repeat 1 1  Language- follow 3 step command 3 3  Language- read & follow direction 1 1  Write a sentence 1 1  Copy design 1 1  Total score 30 30     6CIT Screen 07/28/2019  What Year? 0 points  What month? 0 points  What time? 0 points  Count back from 20 0 points  Months in reverse 0 points  Repeat phrase 0 points  Total Score 0    Immunizations Immunization History   Administered Date(s) Administered   Influenza Split 09/03/2013, 08/01/2014   Influenza, High Dose Seasonal PF 09/03/2017, 07/27/2018, 07/27/2019   Influenza-Unspecified 08/16/2012   Pneumococcal Conjugate-13 02/13/2015   Pneumococcal Polysaccharide-23 12/05/2009, 06/08/2017   Td 03/13/2010   Tdap 12/06/2003   Zoster Recombinat (Shingrix) 08/26/2018, 11/30/2018    TDAP status: Due, Education has been provided regarding the importance of this vaccine. Advised may receive this vaccine at local pharmacy or Health Dept. Aware to provide a copy of the vaccination record if obtained from local pharmacy or Health Dept. Verbalized acceptance and understanding. Deferred.    Health Maintenance Health Maintenance  Topic Date Due   FOOT EXAM  09/10/2017   HEMOGLOBIN A1C  01/30/2020   URINE MICROALBUMIN  08/01/2020   COVID-19 Vaccine (1) 08/15/2020 (Originally 01/04/1958)   INFLUENZA VACCINE  02/13/2021 (Originally 06/16/2020)   TETANUS/TDAP  07/30/2021 (Originally 03/13/2020)   OPHTHALMOLOGY EXAM  07/23/2021   COLONOSCOPY  08/17/2024   DEXA SCAN  Completed   Hepatitis C Screening  Completed   PNA vac  Low Risk Adult  Completed    Dental Screening: Recommended annual dental exams for proper oral hygiene.  Visits every 6 months.   Community Resource Referral / Chronic Care Management: CRR required this visit?  No   CCM required this visit?  No       Plan:   Keep all routine maintenance appointments.   Encouraged to schedule routine follow up with pcp.  I have personally reviewed and noted the following in the patient's chart:   Medical and social history Use of alcohol, tobacco or illicit drugs  Current medications and supplements Functional ability and status Nutritional status Physical activity Advanced directives List of other physicians Hospitalizations, surgeries, and ER visits in previous 12 months Vitals Screenings to include cognitive, depression, and falls Referrals  and appointments  In addition, I have reviewed and discussed with patient certain preventive protocols, quality metrics, and best practice recommendations. A written personalized care plan for preventive services as well as general preventive health recommendations were provided to patient via mychart.     OBrien-Blaney, Abie Killian L, LPN   01/04/7587     I have reviewed the above information and agree with above.   Deborra Medina, MD

## 2020-07-31 DIAGNOSIS — E782 Mixed hyperlipidemia: Secondary | ICD-10-CM | POA: Diagnosis not present

## 2020-07-31 DIAGNOSIS — I25118 Atherosclerotic heart disease of native coronary artery with other forms of angina pectoris: Secondary | ICD-10-CM | POA: Diagnosis not present

## 2020-07-31 DIAGNOSIS — R000 Tachycardia, unspecified: Secondary | ICD-10-CM | POA: Diagnosis not present

## 2020-07-31 DIAGNOSIS — E669 Obesity, unspecified: Secondary | ICD-10-CM | POA: Diagnosis not present

## 2020-07-31 DIAGNOSIS — Z79899 Other long term (current) drug therapy: Secondary | ICD-10-CM | POA: Diagnosis not present

## 2020-07-31 DIAGNOSIS — R0600 Dyspnea, unspecified: Secondary | ICD-10-CM | POA: Diagnosis not present

## 2020-07-31 DIAGNOSIS — I208 Other forms of angina pectoris: Secondary | ICD-10-CM | POA: Diagnosis not present

## 2020-07-31 DIAGNOSIS — J449 Chronic obstructive pulmonary disease, unspecified: Secondary | ICD-10-CM | POA: Diagnosis not present

## 2020-07-31 DIAGNOSIS — I5022 Chronic systolic (congestive) heart failure: Secondary | ICD-10-CM | POA: Diagnosis not present

## 2020-07-31 DIAGNOSIS — I10 Essential (primary) hypertension: Secondary | ICD-10-CM | POA: Diagnosis not present

## 2020-08-12 ENCOUNTER — Other Ambulatory Visit: Payer: Self-pay | Admitting: Internal Medicine

## 2020-09-05 DIAGNOSIS — I5022 Chronic systolic (congestive) heart failure: Secondary | ICD-10-CM | POA: Diagnosis not present

## 2020-09-05 DIAGNOSIS — I208 Other forms of angina pectoris: Secondary | ICD-10-CM | POA: Diagnosis not present

## 2020-09-05 DIAGNOSIS — R0600 Dyspnea, unspecified: Secondary | ICD-10-CM | POA: Diagnosis not present

## 2020-09-05 DIAGNOSIS — R000 Tachycardia, unspecified: Secondary | ICD-10-CM | POA: Diagnosis not present

## 2020-09-05 DIAGNOSIS — I25118 Atherosclerotic heart disease of native coronary artery with other forms of angina pectoris: Secondary | ICD-10-CM | POA: Diagnosis not present

## 2020-09-05 DIAGNOSIS — Z79899 Other long term (current) drug therapy: Secondary | ICD-10-CM | POA: Diagnosis not present

## 2020-09-10 DIAGNOSIS — R0600 Dyspnea, unspecified: Secondary | ICD-10-CM | POA: Diagnosis not present

## 2020-09-10 DIAGNOSIS — I10 Essential (primary) hypertension: Secondary | ICD-10-CM | POA: Diagnosis not present

## 2020-09-10 DIAGNOSIS — I5042 Chronic combined systolic (congestive) and diastolic (congestive) heart failure: Secondary | ICD-10-CM | POA: Diagnosis not present

## 2020-09-10 DIAGNOSIS — I2510 Atherosclerotic heart disease of native coronary artery without angina pectoris: Secondary | ICD-10-CM | POA: Diagnosis not present

## 2020-09-10 DIAGNOSIS — I208 Other forms of angina pectoris: Secondary | ICD-10-CM | POA: Diagnosis not present

## 2020-09-10 DIAGNOSIS — E782 Mixed hyperlipidemia: Secondary | ICD-10-CM | POA: Diagnosis not present

## 2020-09-10 DIAGNOSIS — J449 Chronic obstructive pulmonary disease, unspecified: Secondary | ICD-10-CM | POA: Diagnosis not present

## 2020-09-10 DIAGNOSIS — E669 Obesity, unspecified: Secondary | ICD-10-CM | POA: Diagnosis not present

## 2020-09-10 DIAGNOSIS — R000 Tachycardia, unspecified: Secondary | ICD-10-CM | POA: Diagnosis not present

## 2020-09-12 DIAGNOSIS — M4805 Spinal stenosis, thoracolumbar region: Secondary | ICD-10-CM | POA: Diagnosis not present

## 2020-09-12 DIAGNOSIS — M5416 Radiculopathy, lumbar region: Secondary | ICD-10-CM | POA: Diagnosis not present

## 2020-09-17 DIAGNOSIS — M4805 Spinal stenosis, thoracolumbar region: Secondary | ICD-10-CM | POA: Diagnosis not present

## 2020-09-17 DIAGNOSIS — M5416 Radiculopathy, lumbar region: Secondary | ICD-10-CM | POA: Diagnosis not present

## 2020-09-24 DIAGNOSIS — M5416 Radiculopathy, lumbar region: Secondary | ICD-10-CM | POA: Diagnosis not present

## 2020-09-24 DIAGNOSIS — M4805 Spinal stenosis, thoracolumbar region: Secondary | ICD-10-CM | POA: Diagnosis not present

## 2020-10-02 DIAGNOSIS — Z1231 Encounter for screening mammogram for malignant neoplasm of breast: Secondary | ICD-10-CM

## 2020-10-07 DIAGNOSIS — Z03818 Encounter for observation for suspected exposure to other biological agents ruled out: Secondary | ICD-10-CM | POA: Diagnosis not present

## 2020-10-07 DIAGNOSIS — Z7189 Other specified counseling: Secondary | ICD-10-CM | POA: Diagnosis not present

## 2020-10-07 DIAGNOSIS — U071 COVID-19: Secondary | ICD-10-CM | POA: Diagnosis not present

## 2020-10-08 DIAGNOSIS — U071 COVID-19: Secondary | ICD-10-CM | POA: Diagnosis not present

## 2020-10-08 DIAGNOSIS — J209 Acute bronchitis, unspecified: Secondary | ICD-10-CM | POA: Diagnosis not present

## 2020-10-08 DIAGNOSIS — J208 Acute bronchitis due to other specified organisms: Secondary | ICD-10-CM | POA: Diagnosis not present

## 2020-10-08 DIAGNOSIS — I517 Cardiomegaly: Secondary | ICD-10-CM | POA: Diagnosis not present

## 2020-10-08 DIAGNOSIS — J811 Chronic pulmonary edema: Secondary | ICD-10-CM | POA: Diagnosis not present

## 2020-10-18 DIAGNOSIS — U071 COVID-19: Secondary | ICD-10-CM | POA: Diagnosis not present

## 2020-10-22 ENCOUNTER — Ambulatory Visit
Admission: RE | Admit: 2020-10-22 | Discharge: 2020-10-22 | Disposition: A | Discharge status: Home / Self Care | Payer: Medicare Other | Source: Ambulatory Visit | Attending: Internal Medicine | Admitting: Internal Medicine

## 2020-10-22 ENCOUNTER — Other Ambulatory Visit: Payer: Self-pay

## 2020-10-22 DIAGNOSIS — Z1231 Encounter for screening mammogram for malignant neoplasm of breast: Secondary | ICD-10-CM | POA: Insufficient documentation

## 2020-10-31 DIAGNOSIS — R059 Cough, unspecified: Secondary | ICD-10-CM | POA: Diagnosis not present

## 2020-10-31 DIAGNOSIS — I517 Cardiomegaly: Secondary | ICD-10-CM | POA: Diagnosis not present

## 2020-10-31 DIAGNOSIS — R053 Chronic cough: Secondary | ICD-10-CM | POA: Diagnosis not present

## 2020-10-31 DIAGNOSIS — R918 Other nonspecific abnormal finding of lung field: Secondary | ICD-10-CM | POA: Diagnosis not present

## 2020-11-06 ENCOUNTER — Other Ambulatory Visit (HOSPITAL_COMMUNITY): Payer: Self-pay | Admitting: Specialist

## 2020-11-06 ENCOUNTER — Other Ambulatory Visit: Payer: Self-pay | Admitting: Specialist

## 2020-11-06 DIAGNOSIS — R918 Other nonspecific abnormal finding of lung field: Secondary | ICD-10-CM

## 2020-11-06 DIAGNOSIS — R053 Chronic cough: Secondary | ICD-10-CM

## 2020-12-11 DIAGNOSIS — E669 Obesity, unspecified: Secondary | ICD-10-CM | POA: Diagnosis not present

## 2020-12-11 DIAGNOSIS — I208 Other forms of angina pectoris: Secondary | ICD-10-CM | POA: Diagnosis not present

## 2020-12-11 DIAGNOSIS — J449 Chronic obstructive pulmonary disease, unspecified: Secondary | ICD-10-CM | POA: Diagnosis not present

## 2020-12-11 DIAGNOSIS — R000 Tachycardia, unspecified: Secondary | ICD-10-CM | POA: Diagnosis not present

## 2020-12-11 DIAGNOSIS — I10 Essential (primary) hypertension: Secondary | ICD-10-CM | POA: Diagnosis not present

## 2020-12-11 DIAGNOSIS — E782 Mixed hyperlipidemia: Secondary | ICD-10-CM | POA: Diagnosis not present

## 2020-12-11 DIAGNOSIS — R5382 Chronic fatigue, unspecified: Secondary | ICD-10-CM | POA: Diagnosis not present

## 2020-12-11 DIAGNOSIS — R0600 Dyspnea, unspecified: Secondary | ICD-10-CM | POA: Diagnosis not present

## 2020-12-11 DIAGNOSIS — I25118 Atherosclerotic heart disease of native coronary artery with other forms of angina pectoris: Secondary | ICD-10-CM | POA: Diagnosis not present

## 2020-12-11 DIAGNOSIS — I5042 Chronic combined systolic (congestive) and diastolic (congestive) heart failure: Secondary | ICD-10-CM | POA: Diagnosis not present

## 2021-01-14 DIAGNOSIS — Z955 Presence of coronary angioplasty implant and graft: Secondary | ICD-10-CM | POA: Diagnosis not present

## 2021-01-14 DIAGNOSIS — E782 Mixed hyperlipidemia: Secondary | ICD-10-CM | POA: Diagnosis not present

## 2021-01-14 DIAGNOSIS — I25118 Atherosclerotic heart disease of native coronary artery with other forms of angina pectoris: Secondary | ICD-10-CM | POA: Diagnosis not present

## 2021-01-14 DIAGNOSIS — R5382 Chronic fatigue, unspecified: Secondary | ICD-10-CM | POA: Diagnosis not present

## 2021-01-14 DIAGNOSIS — I208 Other forms of angina pectoris: Secondary | ICD-10-CM | POA: Diagnosis not present

## 2021-01-14 DIAGNOSIS — J449 Chronic obstructive pulmonary disease, unspecified: Secondary | ICD-10-CM | POA: Diagnosis not present

## 2021-01-14 DIAGNOSIS — E669 Obesity, unspecified: Secondary | ICD-10-CM | POA: Diagnosis not present

## 2021-01-14 DIAGNOSIS — I10 Essential (primary) hypertension: Secondary | ICD-10-CM | POA: Diagnosis not present

## 2021-01-14 DIAGNOSIS — R0600 Dyspnea, unspecified: Secondary | ICD-10-CM | POA: Diagnosis not present

## 2021-01-14 DIAGNOSIS — R000 Tachycardia, unspecified: Secondary | ICD-10-CM | POA: Diagnosis not present

## 2021-01-14 DIAGNOSIS — I5042 Chronic combined systolic (congestive) and diastolic (congestive) heart failure: Secondary | ICD-10-CM | POA: Diagnosis not present

## 2021-01-28 ENCOUNTER — Other Ambulatory Visit: Payer: Self-pay

## 2021-01-28 ENCOUNTER — Ambulatory Visit
Admission: RE | Admit: 2021-01-28 | Discharge: 2021-01-28 | Disposition: A | Discharge status: Home / Self Care | Payer: Medicare Other | Source: Ambulatory Visit | Attending: Specialist | Admitting: Specialist

## 2021-01-28 DIAGNOSIS — R053 Chronic cough: Secondary | ICD-10-CM | POA: Diagnosis not present

## 2021-01-28 DIAGNOSIS — R918 Other nonspecific abnormal finding of lung field: Secondary | ICD-10-CM | POA: Insufficient documentation

## 2021-01-28 DIAGNOSIS — J984 Other disorders of lung: Secondary | ICD-10-CM | POA: Diagnosis not present

## 2021-01-28 DIAGNOSIS — I2510 Atherosclerotic heart disease of native coronary artery without angina pectoris: Secondary | ICD-10-CM | POA: Diagnosis not present

## 2021-01-28 DIAGNOSIS — I708 Atherosclerosis of other arteries: Secondary | ICD-10-CM | POA: Diagnosis not present

## 2021-01-28 DIAGNOSIS — Z853 Personal history of malignant neoplasm of breast: Secondary | ICD-10-CM | POA: Diagnosis not present

## 2021-02-06 DIAGNOSIS — G4733 Obstructive sleep apnea (adult) (pediatric): Secondary | ICD-10-CM | POA: Diagnosis not present

## 2021-02-06 DIAGNOSIS — R918 Other nonspecific abnormal finding of lung field: Secondary | ICD-10-CM | POA: Diagnosis not present

## 2021-02-17 ENCOUNTER — Other Ambulatory Visit
Admission: RE | Admit: 2021-02-17 | Discharge: 2021-02-17 | Disposition: A | Discharge status: Home / Self Care | Payer: Medicare Other | Attending: Student | Admitting: Student

## 2021-02-17 DIAGNOSIS — I25118 Atherosclerotic heart disease of native coronary artery with other forms of angina pectoris: Secondary | ICD-10-CM | POA: Insufficient documentation

## 2021-02-17 DIAGNOSIS — I208 Other forms of angina pectoris: Secondary | ICD-10-CM | POA: Diagnosis not present

## 2021-02-17 DIAGNOSIS — J449 Chronic obstructive pulmonary disease, unspecified: Secondary | ICD-10-CM | POA: Diagnosis not present

## 2021-02-17 DIAGNOSIS — R0600 Dyspnea, unspecified: Secondary | ICD-10-CM | POA: Diagnosis not present

## 2021-02-17 DIAGNOSIS — E669 Obesity, unspecified: Secondary | ICD-10-CM | POA: Diagnosis not present

## 2021-02-17 DIAGNOSIS — R000 Tachycardia, unspecified: Secondary | ICD-10-CM | POA: Diagnosis not present

## 2021-02-17 DIAGNOSIS — I25110 Atherosclerotic heart disease of native coronary artery with unstable angina pectoris: Secondary | ICD-10-CM | POA: Diagnosis not present

## 2021-02-17 DIAGNOSIS — Z955 Presence of coronary angioplasty implant and graft: Secondary | ICD-10-CM | POA: Diagnosis not present

## 2021-02-17 DIAGNOSIS — I5042 Chronic combined systolic (congestive) and diastolic (congestive) heart failure: Secondary | ICD-10-CM | POA: Diagnosis not present

## 2021-02-17 DIAGNOSIS — E782 Mixed hyperlipidemia: Secondary | ICD-10-CM | POA: Diagnosis not present

## 2021-02-17 DIAGNOSIS — Z01818 Encounter for other preprocedural examination: Secondary | ICD-10-CM | POA: Diagnosis not present

## 2021-02-17 DIAGNOSIS — I10 Essential (primary) hypertension: Secondary | ICD-10-CM | POA: Diagnosis not present

## 2021-02-17 LAB — BRAIN NATRIURETIC PEPTIDE: B Natriuretic Peptide: 231.7 pg/mL — ABNORMAL HIGH (ref 0.0–100.0)

## 2021-02-18 ENCOUNTER — Other Ambulatory Visit
Admission: RE | Admit: 2021-02-18 | Discharge: 2021-02-18 | Disposition: A | Discharge status: Home / Self Care | Payer: Medicare Other | Source: Ambulatory Visit | Attending: Student | Admitting: Student

## 2021-02-18 ENCOUNTER — Other Ambulatory Visit: Payer: Self-pay

## 2021-02-18 DIAGNOSIS — Z20822 Contact with and (suspected) exposure to covid-19: Secondary | ICD-10-CM | POA: Insufficient documentation

## 2021-02-18 DIAGNOSIS — Z01812 Encounter for preprocedural laboratory examination: Secondary | ICD-10-CM | POA: Insufficient documentation

## 2021-02-18 LAB — SARS CORONAVIRUS 2 (TAT 6-24 HRS): SARS Coronavirus 2: NEGATIVE

## 2021-02-20 ENCOUNTER — Encounter
Admission: RE | Disposition: A | Discharge status: Home / Self Care | Payer: Self-pay | Source: Ambulatory Visit | Attending: Internal Medicine

## 2021-02-20 ENCOUNTER — Other Ambulatory Visit: Payer: Self-pay

## 2021-02-20 ENCOUNTER — Observation Stay
Admission: RE | Admit: 2021-02-20 | Discharge: 2021-02-21 | Disposition: A | Discharge status: Home / Self Care | Payer: Medicare Other | Source: Ambulatory Visit | Attending: Internal Medicine | Admitting: Internal Medicine

## 2021-02-20 DIAGNOSIS — J449 Chronic obstructive pulmonary disease, unspecified: Secondary | ICD-10-CM | POA: Diagnosis not present

## 2021-02-20 DIAGNOSIS — I110 Hypertensive heart disease with heart failure: Secondary | ICD-10-CM | POA: Diagnosis not present

## 2021-02-20 DIAGNOSIS — I252 Old myocardial infarction: Secondary | ICD-10-CM | POA: Insufficient documentation

## 2021-02-20 DIAGNOSIS — I200 Unstable angina: Secondary | ICD-10-CM | POA: Diagnosis not present

## 2021-02-20 DIAGNOSIS — Z853 Personal history of malignant neoplasm of breast: Secondary | ICD-10-CM | POA: Insufficient documentation

## 2021-02-20 DIAGNOSIS — Z955 Presence of coronary angioplasty implant and graft: Secondary | ICD-10-CM | POA: Insufficient documentation

## 2021-02-20 DIAGNOSIS — R0600 Dyspnea, unspecified: Secondary | ICD-10-CM | POA: Diagnosis present

## 2021-02-20 DIAGNOSIS — Z79899 Other long term (current) drug therapy: Secondary | ICD-10-CM | POA: Insufficient documentation

## 2021-02-20 DIAGNOSIS — J45909 Unspecified asthma, uncomplicated: Secondary | ICD-10-CM | POA: Insufficient documentation

## 2021-02-20 DIAGNOSIS — I25110 Atherosclerotic heart disease of native coronary artery with unstable angina pectoris: Secondary | ICD-10-CM | POA: Diagnosis not present

## 2021-02-20 DIAGNOSIS — Z7982 Long term (current) use of aspirin: Secondary | ICD-10-CM | POA: Diagnosis not present

## 2021-02-20 DIAGNOSIS — I5042 Chronic combined systolic (congestive) and diastolic (congestive) heart failure: Secondary | ICD-10-CM | POA: Diagnosis not present

## 2021-02-20 DIAGNOSIS — I2510 Atherosclerotic heart disease of native coronary artery without angina pectoris: Secondary | ICD-10-CM | POA: Diagnosis not present

## 2021-02-20 DIAGNOSIS — R0602 Shortness of breath: Secondary | ICD-10-CM

## 2021-02-20 DIAGNOSIS — R079 Chest pain, unspecified: Secondary | ICD-10-CM

## 2021-02-20 HISTORY — PX: LEFT HEART CATH AND CORONARY ANGIOGRAPHY: CATH118249

## 2021-02-20 HISTORY — PX: CORONARY STENT INTERVENTION: CATH118234

## 2021-02-20 LAB — POCT ACTIVATED CLOTTING TIME: Activated Clotting Time: 470 seconds

## 2021-02-20 LAB — GLUCOSE, CAPILLARY: Glucose-Capillary: 149 mg/dL — ABNORMAL HIGH (ref 70–99)

## 2021-02-20 SURGERY — LEFT HEART CATH AND CORONARY ANGIOGRAPHY
Anesthesia: Moderate Sedation

## 2021-02-20 MED ORDER — FUROSEMIDE 40 MG PO TABS
40.0000 mg | ORAL_TABLET | Freq: Every day | ORAL | Status: DC
  Administered 2021-02-21: 40 mg via ORAL
  Filled 2021-02-20: qty 1

## 2021-02-20 MED ORDER — SPIRONOLACTONE 25 MG PO TABS
25.0000 mg | ORAL_TABLET | Freq: Every day | ORAL | Status: DC
  Administered 2021-02-21: 25 mg via ORAL
  Filled 2021-02-20: qty 1

## 2021-02-20 MED ORDER — SODIUM CHLORIDE 0.9 % WEIGHT BASED INFUSION: 1.0000 mL/kg/h | INTRAVENOUS | Status: DC

## 2021-02-20 MED ORDER — FENTANYL CITRATE (PF) 100 MCG/2ML IJ SOLN
INTRAMUSCULAR | Status: DC | PRN
  Administered 2021-02-20: 25 ug via INTRAVENOUS

## 2021-02-20 MED ORDER — SACUBITRIL-VALSARTAN 49-51 MG PO TABS
1.0000 | ORAL_TABLET | Freq: Two times a day (BID) | ORAL | Status: DC
  Administered 2021-02-20 – 2021-02-21 (×2): 1 via ORAL
  Filled 2021-02-20 (×2): qty 1

## 2021-02-20 MED ORDER — SODIUM CHLORIDE 0.9 % WEIGHT BASED INFUSION: 1.0000 mL/kg/h | INTRAVENOUS | Status: AC

## 2021-02-20 MED ORDER — BIVALIRUDIN TRIFLUOROACETATE 250 MG IV SOLR
INTRAVENOUS | Status: AC
  Filled 2021-02-20: qty 250

## 2021-02-20 MED ORDER — LABETALOL HCL 5 MG/ML IV SOLN: 10.0000 mg | INTRAVENOUS | Status: AC | PRN

## 2021-02-20 MED ORDER — BIVALIRUDIN BOLUS VIA INFUSION - CUPID
INTRAVENOUS | Status: DC | PRN
  Administered 2021-02-20: 62.925 mg via INTRAVENOUS

## 2021-02-20 MED ORDER — METOPROLOL SUCCINATE ER 50 MG PO TB24
200.0000 mg | ORAL_TABLET | Freq: Every day | ORAL | Status: DC
  Administered 2021-02-21: 200 mg via ORAL
  Filled 2021-02-20: qty 4

## 2021-02-20 MED ORDER — HEPARIN (PORCINE) IN NACL 2000-0.9 UNIT/L-% IV SOLN
INTRAVENOUS | Status: DC | PRN
  Administered 2021-02-20: 1000 mL

## 2021-02-20 MED ORDER — LORATADINE 10 MG PO TABS: 10.0000 mg | ORAL_TABLET | Freq: Every day | ORAL | Status: DC | PRN

## 2021-02-20 MED ORDER — ASPIRIN 81 MG PO CHEW
CHEWABLE_TABLET | ORAL | Status: DC | PRN
  Administered 2021-02-20: 243 mg via ORAL

## 2021-02-20 MED ORDER — SODIUM CHLORIDE 0.9 % IV SOLN
INTRAVENOUS | Status: DC | PRN
  Administered 2021-02-20: 1.75 mg/kg/h via INTRAVENOUS

## 2021-02-20 MED ORDER — ACETAMINOPHEN 325 MG PO TABS
650.0000 mg | ORAL_TABLET | ORAL | Status: DC | PRN
  Administered 2021-02-21: 650 mg via ORAL
  Filled 2021-02-20: qty 2

## 2021-02-20 MED ORDER — D-MANNOSE 500 MG PO CAPS: 800.0000 mg | ORAL_CAPSULE | Freq: Every day | ORAL | Status: DC

## 2021-02-20 MED ORDER — ZOLPIDEM TARTRATE 5 MG PO TABS
5.0000 mg | ORAL_TABLET | Freq: Every evening | ORAL | Status: DC | PRN
  Administered 2021-02-20: 5 mg via ORAL
  Filled 2021-02-20: qty 1

## 2021-02-20 MED ORDER — IOHEXOL 300 MG/ML  SOLN
INTRAMUSCULAR | Status: DC | PRN
  Administered 2021-02-20: 140 mL

## 2021-02-20 MED ORDER — RANOLAZINE ER 500 MG PO TB12
500.0000 mg | ORAL_TABLET | Freq: Two times a day (BID) | ORAL | Status: DC
  Administered 2021-02-20 – 2021-02-21 (×2): 500 mg via ORAL
  Filled 2021-02-20 (×3): qty 1

## 2021-02-20 MED ORDER — SODIUM CHLORIDE 0.9% FLUSH: 3.0000 mL | INTRAVENOUS | Status: DC | PRN

## 2021-02-20 MED ORDER — ISOSORBIDE MONONITRATE ER 60 MG PO TB24
120.0000 mg | ORAL_TABLET | Freq: Every day | ORAL | Status: DC
  Administered 2021-02-21: 120 mg via ORAL
  Filled 2021-02-20: qty 2

## 2021-02-20 MED ORDER — SODIUM CHLORIDE 0.9 % WEIGHT BASED INFUSION: 3.0000 mL/kg/h | INTRAVENOUS | Status: AC

## 2021-02-20 MED ORDER — CLOPIDOGREL BISULFATE 75 MG PO TABS
ORAL_TABLET | ORAL | Status: DC | PRN
  Administered 2021-02-20: 300 mg via ORAL

## 2021-02-20 MED ORDER — NITROGLYCERIN 0.4 MG SL SUBL
0.4000 mg | SUBLINGUAL_TABLET | SUBLINGUAL | Status: DC | PRN
Start: 2021-02-20 — End: 2021-02-21

## 2021-02-20 MED ORDER — ELDERBERRY 575 MG/5ML PO SYRP: 1000.0000 mg | ORAL_SOLUTION | Freq: Every day | ORAL | Status: DC

## 2021-02-20 MED ORDER — HYDRALAZINE HCL 20 MG/ML IJ SOLN: 10.0000 mg | INTRAMUSCULAR | Status: AC | PRN

## 2021-02-20 MED ORDER — SODIUM CHLORIDE 0.9 % IV SOLN: 250.0000 mL | INTRAVENOUS | Status: DC | PRN

## 2021-02-20 MED ORDER — FENTANYL CITRATE (PF) 100 MCG/2ML IJ SOLN
INTRAMUSCULAR | Status: AC
  Filled 2021-02-20: qty 2

## 2021-02-20 MED ORDER — GLUCOSAMINE-CHONDROITIN 750-600 MG PO TABS: ORAL_TABLET | Freq: Every day | ORAL | Status: DC

## 2021-02-20 MED ORDER — ASPIRIN 81 MG PO CHEW
CHEWABLE_TABLET | ORAL | Status: AC
  Filled 2021-02-20: qty 3

## 2021-02-20 MED ORDER — CLOPIDOGREL BISULFATE 75 MG PO TABS
ORAL_TABLET | ORAL | Status: AC
  Filled 2021-02-20: qty 1

## 2021-02-20 MED ORDER — PANTOPRAZOLE SODIUM 40 MG PO TBEC
40.0000 mg | DELAYED_RELEASE_TABLET | Freq: Every day | ORAL | Status: DC
  Administered 2021-02-21: 40 mg via ORAL
  Filled 2021-02-20: qty 1

## 2021-02-20 MED ORDER — ASPIRIN 81 MG PO CHEW
81.0000 mg | CHEWABLE_TABLET | Freq: Every day | ORAL | Status: DC
  Administered 2021-02-21: 81 mg via ORAL
  Filled 2021-02-20: qty 1

## 2021-02-20 MED ORDER — MIDAZOLAM HCL 2 MG/2ML IJ SOLN
INTRAMUSCULAR | Status: AC
  Filled 2021-02-20: qty 2

## 2021-02-20 MED ORDER — ONDANSETRON HCL 4 MG/2ML IJ SOLN: 4.0000 mg | Freq: Four times a day (QID) | INTRAMUSCULAR | Status: DC | PRN

## 2021-02-20 MED ORDER — MIDAZOLAM HCL 2 MG/2ML IJ SOLN
INTRAMUSCULAR | Status: DC | PRN
  Administered 2021-02-20: 1 mg via INTRAVENOUS

## 2021-02-20 MED ORDER — CLOPIDOGREL BISULFATE 75 MG PO TABS: 75.0000 mg | ORAL_TABLET | Freq: Every day | ORAL | Status: DC

## 2021-02-20 MED ORDER — CLOPIDOGREL BISULFATE 75 MG PO TABS
75.0000 mg | ORAL_TABLET | Freq: Every day | ORAL | Status: DC
  Administered 2021-02-21: 75 mg via ORAL
  Filled 2021-02-20: qty 1

## 2021-02-20 MED ORDER — ASPIRIN 81 MG PO CHEW: 81.0000 mg | CHEWABLE_TABLET | ORAL | Status: DC

## 2021-02-20 MED ORDER — SODIUM CHLORIDE 0.9% FLUSH
3.0000 mL | Freq: Two times a day (BID) | INTRAVENOUS | Status: DC
  Administered 2021-02-21: 3 mL via INTRAVENOUS

## 2021-02-20 MED ORDER — SODIUM CHLORIDE 0.9% FLUSH
3.0000 mL | Freq: Two times a day (BID) | INTRAVENOUS | Status: DC
  Administered 2021-02-20: 3 mL via INTRAVENOUS

## 2021-02-20 MED ORDER — CLOPIDOGREL BISULFATE 75 MG PO TABS
ORAL_TABLET | ORAL | Status: AC
  Filled 2021-02-20: qty 3

## 2021-02-20 MED ORDER — ASPIRIN 81 MG PO TABS: 81.0000 mg | ORAL_TABLET | Freq: Every day | ORAL | Status: DC

## 2021-02-20 SURGICAL SUPPLY — 21 items
BALLN TREK RX 2.5X15 (BALLOONS) ×2
BALLN ~~LOC~~ TREK RX 3.0X8 (BALLOONS) ×2
BALLOON TREK RX 2.5X15 (BALLOONS) IMPLANT
BALLOON ~~LOC~~ TREK RX 3.0X8 (BALLOONS) IMPLANT
CATH INFINITI 5FR JL4 (CATHETERS) ×1 IMPLANT
CATH INFINITI JR4 5F (CATHETERS) ×1 IMPLANT
CATH VISTA GUIDE 6FR JR4 SH (CATHETERS) ×1 IMPLANT
DEVICE CLOSURE MYNXGRIP 6/7F (Vascular Products) ×1 IMPLANT
KIT ENCORE 26 ADVANTAGE (KITS) ×1 IMPLANT
NDL PERC 18GX7CM (NEEDLE) IMPLANT
NEEDLE PERC 18GX7CM (NEEDLE) ×2 IMPLANT
PACK CARDIAC CATH (CUSTOM PROCEDURE TRAY) ×2 IMPLANT
PROTECTION STATION PRESSURIZED (MISCELLANEOUS) ×2
SET ATX SIMPLICITY (MISCELLANEOUS) ×1 IMPLANT
SHEATH AVANTI 5FR X 11CM (SHEATH) ×1 IMPLANT
SHEATH AVANTI 6FR X 11CM (SHEATH) ×1 IMPLANT
STATION PROTECTION PRESSURIZED (MISCELLANEOUS) IMPLANT
STENT RESOLUTE ONYX 2.5X12 (Permanent Stent) ×1 IMPLANT
STENT RESOLUTE ONYX 2.75X15 (Permanent Stent) ×1 IMPLANT
WIRE G HI TQ BMW 190 (WIRE) ×2 IMPLANT
WIRE GUIDERIGHT .035X150 (WIRE) ×1 IMPLANT

## 2021-02-21 ENCOUNTER — Encounter: Payer: Self-pay | Admitting: Internal Medicine

## 2021-02-21 DIAGNOSIS — J45909 Unspecified asthma, uncomplicated: Secondary | ICD-10-CM | POA: Diagnosis not present

## 2021-02-21 DIAGNOSIS — I5042 Chronic combined systolic (congestive) and diastolic (congestive) heart failure: Secondary | ICD-10-CM | POA: Diagnosis not present

## 2021-02-21 DIAGNOSIS — I110 Hypertensive heart disease with heart failure: Secondary | ICD-10-CM | POA: Diagnosis not present

## 2021-02-21 DIAGNOSIS — I25110 Atherosclerotic heart disease of native coronary artery with unstable angina pectoris: Secondary | ICD-10-CM | POA: Diagnosis not present

## 2021-02-21 DIAGNOSIS — I252 Old myocardial infarction: Secondary | ICD-10-CM | POA: Diagnosis not present

## 2021-02-21 DIAGNOSIS — J449 Chronic obstructive pulmonary disease, unspecified: Secondary | ICD-10-CM | POA: Diagnosis not present

## 2021-02-21 LAB — BASIC METABOLIC PANEL
Anion gap: 8 (ref 5–15)
BUN: 16 mg/dL (ref 8–23)
CO2: 22 mmol/L (ref 22–32)
Calcium: 8.9 mg/dL (ref 8.9–10.3)
Chloride: 107 mmol/L (ref 98–111)
Creatinine, Ser: 0.81 mg/dL (ref 0.44–1.00)
GFR, Estimated: >60 mL/min (ref >60)
Glucose, Bld: 117 mg/dL — ABNORMAL HIGH (ref 70–99)
Potassium: 3.9 mmol/L (ref 3.5–5.1)
Sodium: 137 mmol/L (ref 135–145)

## 2021-02-21 LAB — CBC
HCT: 37.2 % (ref 36.0–46.0)
Hemoglobin: 12.7 g/dL (ref 12.0–15.0)
MCH: 31 pg (ref 26.0–34.0)
MCHC: 34.1 g/dL (ref 30.0–36.0)
MCV: 90.7 fL (ref 80.0–100.0)
Platelets: 216 10*3/uL (ref 150–400)
RBC: 4.1 MIL/uL (ref 3.87–5.11)
RDW: 13.1 % (ref 11.5–15.5)
WBC: 8.4 10*3/uL (ref 4.0–10.5)
nRBC: 0 % (ref 0.0–0.2)

## 2021-02-21 NOTE — Plan of Care (Signed)
  Problem: Health Behavior/Discharge Planning: Goal: Ability to manage health-related needs will improve Outcome: Adequate for Discharge   Problem: Education: Goal: Knowledge of General Education information will improve Description: Including pain rating scale, medication(s)/side effects and non-pharmacologic comfort measures Outcome: Adequate for Discharge   Problem: Clinical Measurements: Goal: Ability to maintain clinical measurements within normal limits will improve Outcome: Adequate for Discharge Goal: Cardiovascular complication will be avoided Outcome: Adequate for Discharge

## 2021-02-21 NOTE — Plan of Care (Signed)

## 2021-02-21 NOTE — Progress Notes (Signed)
Rounded on this patient. She is resting comfortably in bed and her husband is at the bedside. The patient's right femoral site is clean, dry and intact and which minor brusing around the site and a occlusive, transparent distress remains in place. The site is free of hematoma, drainage, signs of infection or pain at this time. The patient states that her chest pain has completely resolved; however, she did continue to have dyspnea on exertion when ambulating around the unit on today. The patient's vss, she is in no apparent distress and she remains in SR on the bedside telemetry monitor. The patient asked if she could take a 5 hour drive to see her daughter on Monday. I recommended that this trip be deferred at this time and the patient should wait at least 1 week post procedure to start back taking long rides.  The patient was instructed to follow up with our office as an outpatient as scheduled. She verbalized understanding of all of this information.

## 2021-02-21 NOTE — Discharge Instructions (Signed)
No heavy lifting for 1 week Resume previous medications Consider resuming statin therapy Unable to tolerate statin then we must consider Repatha Patient follow-up in the office 1 to 2 weeks

## 2021-02-21 NOTE — Progress Notes (Signed)
[facility] Cardiology    SUBJECTIVE: Status post PCI and stent RCA in-stent restenosis requiring overlapping DES stents patient has some symptom improvement will increase activity hopefully discharge home have patient follow-up with cardiology   Vitals:   02/20/21 1938 02/21/21 0349 02/21/21 0753 02/21/21 0755  BP: 133/60 119/65  127/77  Pulse: 84 (!) 110  83  Resp: 20 20  16   Temp: 99.1 F (37.3 C) 98 F (36.7 C)  98.6 F (37 C)  TempSrc: Oral Oral  Oral  SpO2: 96% 93%  95%  Weight:   85.2 kg   Height:         Intake/Output Summary (Last 24 hours) at 02/21/2021 0900 Last data filed at 02/21/2021 0253 Gross per 24 hour  Intake 483 ml  Output 1000 ml  Net -517 ml      PHYSICAL EXAM  General: Well developed, well nourished, in no acute distress HEENT:  Normocephalic and atramatic Neck:  No JVD.  Lungs: Clear bilaterally to auscultation and percussion. Heart: HRRR . Normal S1 and S2 without gallops or murmurs.  Abdomen: Bowel sounds are positive, abdomen soft and non-tender  Msk:  Back normal, normal gait. Normal strength and tone for age. Extremities: No clubbing, cyanosis or edema.   Neuro: Alert and oriented X 3. Psych:  Good affect, responds appropriately   LABS: Basic Metabolic Panel: No results for input(s): NA, K, CL, CO2, GLUCOSE, BUN, CREATININE, CALCIUM, MG, PHOS in the last 72 hours. Liver Function Tests: No results for input(s): AST, ALT, ALKPHOS, BILITOT, PROT, ALBUMIN in the last 72 hours. No results for input(s): LIPASE, AMYLASE in the last 72 hours. CBC: No results for input(s): WBC, NEUTROABS, HGB, HCT, MCV, PLT in the last 72 hours. Cardiac Enzymes: No results for input(s): CKTOTAL, CKMB, CKMBINDEX, TROPONINI in the last 72 hours. BNP: Invalid input(s): POCBNP D-Dimer: No results for input(s): DDIMER in the last 72 hours. Hemoglobin A1C: No results for input(s): HGBA1C in the last 72 hours. Fasting Lipid Panel: No results for input(s): CHOL, HDL,  LDLCALC, TRIG, CHOLHDL, LDLDIRECT in the last 72 hours. Thyroid Function Tests: No results for input(s): TSH, T4TOTAL, T3FREE, THYROIDAB in the last 72 hours.  Invalid input(s): FREET3 Anemia Panel: No results for input(s): VITAMINB12, FOLATE, FERRITIN, TIBC, IRON, RETICCTPCT in the last 72 hours.  CARDIAC CATHETERIZATION  Result Date: 02/20/2021  Non-stenotic Ost LAD to Prox LAD lesion was previously treated.  Mid LAD lesion is 50% stenosed.  Previously placed Dist RCA drug eluting stent is widely patent.  A drug-eluting stent was successfully placed using a STENT RESOLUTE ONYX [id number].  Mid RCA-1 lesion is 95% stenosed.  Post intervention, there is a 0% residual stenosis.  Mid RCA-2 lesion is 70% stenosed.  A drug-eluting stent was successfully placed using a STENT RESOLUTE ONYX [id number].  Post intervention, there is a 0% residual stenosis.  Conclusion Diagnostic cardiac cath because of unstable angina Left ventricular function between 50 and 55% Coronaries left main minor irregularities LAD Minor irregularities Circumflex also minor irregularities RCA large multiple stents proximal to distal in-stent 95% in the mid segment Intervention Successful PCI and stent to mid RCA with a 2.75 x 15 mm resolute Onyx Postdilated with a 3.0 x 8 mm [place] trek Possible edge dissection so we placed a 2.5 x 15 mm resolute Onyx to cover that area and what appeared to be a 70% lesion The stent balloon was used to smooth out the transition between both stent Conclusion successful PCI and stent of  in-stent restenosis with 2 overlapping stents TIMI-3 flow was maintained throughout the procedure at the end of the case there was 0% lesion     Echo none  TELEMETRY: Normal sinus rhythm nonspecific T2 changes rate of 90:  ASSESSMENT AND PLAN: Status post PCI and stent to RCA DES Coronary disease Hypertension Mild obesity Shortness of breath  GERD Hyperlipidemia Asthma Lower extremity edema History of heart  failure chronic    Plan Status post PCI and stent with DES overlapping stents to RCA DES Continue Plavix aspirin indefinitely Maintain beta-blockade therapy Entresto diuretics including spironolactone Agree with omeprazole therapy for reflux type symptoms With aggressively pursue statins if she still unable to tolerate statins will consider Repatha Recommend modest weight loss exercise portion control Continue diuretics support stockings for lower extremity edema Maintain aggressive blood pressure management Follow-up with cardiology 1 to 2 weeks   Yolonda Kida, MD 02/21/2021 9:00 AM

## 2021-02-24 ENCOUNTER — Telehealth: Payer: Self-pay

## 2021-02-24 NOTE — Telephone Encounter (Signed)
No transition of care appointment scheduled. Patient HFU with Cardiology. Standard follow up scheduled 03/2021.

## 2021-02-27 ENCOUNTER — Other Ambulatory Visit: Payer: Self-pay | Admitting: Internal Medicine

## 2021-02-27 ENCOUNTER — Encounter
Admission: RE | Disposition: A | Discharge status: Home / Self Care | Payer: Self-pay | Source: Ambulatory Visit | Attending: Vascular Surgery

## 2021-02-27 ENCOUNTER — Ambulatory Visit: Admission: RE | Admit: 2021-02-27 | Payer: Medicare Other | Source: Ambulatory Visit | Admitting: Vascular Surgery

## 2021-02-27 ENCOUNTER — Other Ambulatory Visit (INDEPENDENT_AMBULATORY_CARE_PROVIDER_SITE_OTHER): Payer: Self-pay | Admitting: Vascular Surgery

## 2021-02-27 ENCOUNTER — Other Ambulatory Visit (INDEPENDENT_AMBULATORY_CARE_PROVIDER_SITE_OTHER): Payer: Medicare Other | Admitting: Vascular Surgery

## 2021-02-27 ENCOUNTER — Encounter: Payer: Self-pay | Admitting: Vascular Surgery

## 2021-02-27 ENCOUNTER — Ambulatory Visit
Admission: RE | Admit: 2021-02-27 | Discharge: 2021-02-27 | Disposition: A | Discharge status: Home / Self Care | Payer: Medicare Other | Source: Ambulatory Visit | Attending: Vascular Surgery | Admitting: Vascular Surgery

## 2021-02-27 ENCOUNTER — Other Ambulatory Visit: Payer: Self-pay

## 2021-02-27 ENCOUNTER — Ambulatory Visit
Admission: RE | Admit: 2021-02-27 | Discharge: 2021-02-27 | Disposition: A | Discharge status: Home / Self Care | Payer: Medicare Other | Source: Ambulatory Visit | Attending: Internal Medicine | Admitting: Internal Medicine

## 2021-02-27 ENCOUNTER — Ambulatory Visit: Payer: Medicare Other

## 2021-02-27 DIAGNOSIS — Y840 Cardiac catheterization as the cause of abnormal reaction of the patient, or of later complication, without mention of misadventure at the time of the procedure: Secondary | ICD-10-CM | POA: Diagnosis not present

## 2021-02-27 DIAGNOSIS — Z888 Allergy status to other drugs, medicaments and biological substances status: Secondary | ICD-10-CM | POA: Insufficient documentation

## 2021-02-27 DIAGNOSIS — E1169 Type 2 diabetes mellitus with other specified complication: Secondary | ICD-10-CM | POA: Diagnosis not present

## 2021-02-27 DIAGNOSIS — E119 Type 2 diabetes mellitus without complications: Secondary | ICD-10-CM

## 2021-02-27 DIAGNOSIS — I729 Aneurysm of unspecified site: Secondary | ICD-10-CM

## 2021-02-27 DIAGNOSIS — I25118 Atherosclerotic heart disease of native coronary artery with other forms of angina pectoris: Secondary | ICD-10-CM | POA: Diagnosis not present

## 2021-02-27 DIAGNOSIS — I10 Essential (primary) hypertension: Secondary | ICD-10-CM | POA: Diagnosis not present

## 2021-02-27 DIAGNOSIS — I724 Aneurysm of artery of lower extremity: Secondary | ICD-10-CM | POA: Insufficient documentation

## 2021-02-27 DIAGNOSIS — E785 Hyperlipidemia, unspecified: Secondary | ICD-10-CM | POA: Insufficient documentation

## 2021-02-27 DIAGNOSIS — T81718A Complication of other artery following a procedure, not elsewhere classified, initial encounter: Secondary | ICD-10-CM | POA: Diagnosis not present

## 2021-02-27 DIAGNOSIS — Z880 Allergy status to penicillin: Secondary | ICD-10-CM | POA: Insufficient documentation

## 2021-02-27 DIAGNOSIS — M85561 Aneurysmal bone cyst, right lower leg: Secondary | ICD-10-CM

## 2021-02-27 HISTORY — PX: PSEUDOANERYSM COMPRESSION: CATH118259

## 2021-02-27 HISTORY — DX: Aneurysm of unspecified site: I72.9

## 2021-02-27 SURGERY — PSEUDOANERYSM COMPRESSION
Anesthesia: LOCAL

## 2021-02-27 SURGERY — PSEUDOANERYSM COMPRESSION
Anesthesia: Moderate Sedation | Laterality: Right

## 2021-02-27 MED ORDER — THROMBIN FOR PERCUTANEOUS TREATMENT OF PSEUDOANEURYSM (5000UNITS/10ML)
Freq: Once | PERCUTANEOUS | Status: AC
  Administered 2021-02-27: 1000 [IU] via PERCUTANEOUS
  Filled 2021-02-27: qty 1

## 2021-02-27 MED ORDER — CLINDAMYCIN PHOSPHATE 300 MG/50ML IV SOLN: 300.0000 mg | INTRAVENOUS | Status: AC

## 2021-02-27 MED ORDER — THROMBIN 5000 UNITS EX KIT: 5000.0000 [IU] | PACK | Freq: Once | CUTANEOUS | 0 refills | Status: AC

## 2021-02-27 MED ORDER — CLINDAMYCIN PHOSPHATE 300 MG/50ML IV SOLN
INTRAVENOUS | Status: AC
  Administered 2021-02-27: 300 mg via INTRAVENOUS
  Filled 2021-02-27: qty 50

## 2021-02-27 SURGICAL SUPPLY — 2 items
COVER PROBE U/S 5X48 (MISCELLANEOUS) ×2 IMPLANT
TRAY LACERAT/PLASTIC (MISCELLANEOUS) ×1 IMPLANT

## 2021-02-27 NOTE — Interval H&P Note (Signed)
History and Physical Interval Note:  02/27/2021 5:52 PM  Stacie Mcdonald  has presented today for surgery, with the diagnosis of pseudoaneurysm.  The various methods of treatment have been discussed with the patient and family. After consideration of risks, benefits and other options for treatment, the patient has consented to  Procedure(s): PSEUDOANERYSM COMPRESSION (N/A) as a surgical intervention.  The patient's history has been reviewed, patient examined, no change in status, stable for surgery.  I have reviewed the patient's chart and labs.  Questions were answered to the patient's satisfaction.     Hortencia Pilar

## 2021-02-27 NOTE — Progress Notes (Signed)
  MRN : [id number]  Stacie Mcdonald is a 75 y.o. (05/27/1946) female who presents with chief complaint of No chief complaint on file. .  History of Present Illness:   I am asked to evaluate the patient by Dr. Cole Wood.  Patient is a 75-year-old woman who underwent cardiac catheterization with coronary angioplasty and stent placement on 02/20/2021.  The patient noted increasing pain in her right groin which prompted a duplex ultrasound done today.  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  Current Meds  Medication Sig  . thrombin 5000 units KIT Apply 5,000 Units topically once for 1 dose.    Past Medical History:  Diagnosis Date  . Allergy   . Arthritis    fingers, knees  . Asthma   . Breast Cancer 1991   R mastectomy. XRT , chemo  . Breast cancer (HCC) 1991   RT MASTECTOMY  . CAD (coronary artery disease) 2008   50% LAD occlusion 2009,  stent in mid LAD 2008 for 95%   . Chicken pox   . Colon polyps April 2009   by Colonoscopy, [place]  . GERD (gastroesophageal reflux disease)   . Heart disease   . Hyperlipidemia   . Hypertension   . Myocardial infarction (HCC)    silent. discovered through testing  . 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 Rheumatic fever   . Sleep apnea    CPAP  . Stable angina (HCC) 07/27/2019  . Urinary tract infection   . Wears contact lenses    left only    Past Surgical History:  Procedure Laterality Date  . BREAST BIOPSY     right breast reconstruction S/P mastectomy  . BREAST SURGERY     right mastectomy  . CATARACT EXTRACTION W/PHACO Left 05/01/2020   Procedure: CATARACT EXTRACTION PHACO AND INTRAOCULAR LENS PLACEMENT (IOC) LEFT 5.45  00:48.6  11.2%;  Surgeon: Brasington, Chadwick, MD;  Location: [facility];  Service: Ophthalmology;  Laterality: Left;  sleep apnea  . COLONOSCOPY WITH PROPOFOL N/A 08/18/2019   Procedure: COLONOSCOPY WITH PROPOFOL;  Surgeon: Wohl, Darren, MD;  Location: [facility];  Service: Endoscopy;  Laterality: N/A;  sleep apnea  . CORONARY ANGIOPLASTY WITH STENT PLACEMENT  Jan 2013   mid RCA 75% occlusion resolved to 0%, [place]  . CORONARY STENT INTERVENTION N/A 04/07/2018   Procedure: CORONARY STENT INTERVENTION;  Surgeon: Callwood, Dwayne D, MD;  Location: [facility];  Service: Cardiovascular;  Laterality: N/A;  . CORONARY STENT INTERVENTION N/A 02/20/2021   Procedure: CORONARY STENT INTERVENTION;  Surgeon: Callwood, Dwayne D, MD;  Location: [facility];  Service: Cardiovascular;  Laterality: N/A;  . LEFT HEART CATH AND CORONARY ANGIOGRAPHY N/A 02/20/2021   Procedure: LEFT HEART CATH AND CORONARY ANGIOGRAPHY;  Surgeon: Callwood, Dwayne D, MD;  Location: [facility];  Service: Cardiovascular;  Laterality: N/A;  . MASTECTOMY Right 1991   BREAST CA  . POLYPECTOMY  08/18/2019   Procedure: POLYPECTOMY;  Surgeon: Wohl, Darren, MD;  Location: [facility];  Service: Endoscopy;;  . RIGHT/LEFT HEART CATH AND CORONARY ANGIOGRAPHY N/A 04/07/2018   Procedure: RIGHT/LEFT HEART CATH AND CORONARY ANGIOGRAPHY;  Surgeon: Callwood, Dwayne D, MD;  Location: [facility];  Service: Cardiovascular;  Laterality: N/A;  . TONSILLECTOMY      Social History Social History   Tobacco Use  . Smoking status: Never Smoker  . Smokeless tobacco: Never Used    Vaping Use  . Vaping Use: Never used  Substance Use Topics  . Alcohol use: Yes    Alcohol/week: 1.0 standard drink    Types: 1 Glasses of wine per week    Comment: occasional wine  . Drug use: No    Family History Family History  Problem Relation Age of Onset  . Heart disease Mother 68       massive MI  . Heart disease Father 45       AMI  . Cancer Father        metastatic lung CA  . Diabetes Sister   . Cancer Maternal Aunt   . Breast cancer Maternal Aunt 50  . Breast cancer Cousin        40s  . Breast cancer Cousin        40s  No family history of bleeding/clotting  disorders, porphyria or autoimmune disease   Allergies  Allergen Reactions  . Penicillins Hives    Has patient had a PCN reaction causing immediate rash, facial/tongue/throat swelling, SOB or lightheadedness with hypotension: No Has patient had a PCN reaction causing severe rash involving mucus membranes or skin necrosis: Yes Has patient had a PCN reaction that required hospitalization: No Has patient had a PCN reaction occurring within the last 10 years: Yes If all of the above answers are "NO", then may proceed with Cephalosporin use.   . Statins     Other reaction(s): Muscle Pain     REVIEW OF SYSTEMS (Negative unless checked)  Constitutional: []Weight loss  []Fever  []Chills Cardiac: []Chest pain   []Chest pressure   []Palpitations   []Shortness of breath when laying flat   []Shortness of breath with exertion. Vascular:  []Pain in legs with walking   []Pain in legs at rest  []History of DVT   []Phlebitis   []Swelling in legs   []Varicose veins   []Non-healing ulcers Pulmonary:   []Uses home oxygen   []Productive cough   []Hemoptysis   []Wheeze  []COPD   []Asthma Neurologic:  []Dizziness   []Seizures   []History of stroke   []History of TIA  []Aphasia   []Vissual changes   []Weakness or numbness in arm   []Weakness or numbness in leg Musculoskeletal:   []Joint swelling   []Joint pain   []Low back pain Hematologic:  []Easy bruising  []Easy bleeding   []Hypercoagulable state   []Anemic Gastrointestinal:  []Diarrhea   []Vomiting  []Gastroesophageal reflux/heartburn   []Difficulty swallowing. Genitourinary:  []Chronic kidney disease   []Difficult urination  []Frequent urination   []Blood in urine Skin:  []Rashes   []Ulcers  Psychological:  []History of anxiety   [] History of major depression.  Physical Examination  There were no vitals filed for this visit. There is no height or weight on file to calculate BMI. Gen: WD/WN, NAD Head: [place]/AT, No temporalis wasting.  Ear/Nose/Throat:  Hearing grossly intact, nares w/o erythema or drainage, poor dentition Eyes: PER, EOMI, sclera nonicteric.  Neck: Supple, no masses.  No bruit or JVD.  Pulmonary:  Good air movement, clear to auscultation bilaterally, no use of accessory muscles.  Cardiac: RRR, normal S1, S2, no Murmurs. Vascular: Palpable mass right groin consistent with hematoma/pseudoaneurysm Vessel Right Left  Radial Palpable Palpable  Femoral Palpable Palpable  PT Palpable Palpable  DP Palpable Palpable  Gastrointestinal: soft, non-distended. No guarding/no peritoneal signs.  Musculoskeletal: M/S 5/5 throughout.  No deformity or atrophy.  Neurologic: CN 2-12 intact. Pain and light touch intact in extremities.    Symmetrical.  Speech is fluent. Motor exam as listed above. Psychiatric: Judgment intact, Mood & affect appropriate for pt's clinical situation. Dermatologic: No rashes or ulcers noted.  No changes consistent with cellulitis.   CBC Lab Results  Component Value Date   WBC 8.4 02/21/2021   HGB 12.7 02/21/2021   HCT 37.2 02/21/2021   MCV 90.7 02/21/2021   PLT 216 02/21/2021    BMET    Component Value Date/Time   NA 137 02/21/2021 1057   NA 141 07/27/2019 0000   NA 139 12/25/2013 0808   K 3.9 02/21/2021 1057   K 4.0 12/25/2013 0808   CL 107 02/21/2021 1057   CL 107 12/25/2013 0808   CO2 22 02/21/2021 1057   CO2 24 12/25/2013 0808   GLUCOSE 117 (H) 02/21/2021 1057   GLUCOSE 111 (H) 12/25/2013 0808   BUN 16 02/21/2021 1057   BUN 20 07/27/2019 0000   BUN 17 12/25/2013 0808   CREATININE 0.81 02/21/2021 1057   CREATININE 0.55 (L) 12/25/2013 0808   CALCIUM 8.9 02/21/2021 1057   CALCIUM 8.8 12/25/2013 0808   GFRNONAA >60 02/21/2021 1057   GFRNONAA >60 12/25/2013 0808   GFRAA >60 04/15/2019 0835   GFRAA >60 12/25/2013 0808   Estimated Creatinine Clearance: 62.4 mL/min (by C-G formula based on SCr of 0.81 mg/dL).  COAG Lab Results  Component Value Date   INR 1.0 09/27/2012     Radiology CARDIAC CATHETERIZATION  Result Date: 02/21/2021  Non-stenotic Ost LAD to Prox LAD lesion was previously treated.  Mid LAD lesion is 50% stenosed.  Previously placed Dist RCA drug eluting stent is widely patent.  A drug-eluting stent was successfully placed using a STENT RESOLUTE ONYX 2.75X15.  Mid RCA-1 lesion is 95% stenosed.  Post intervention, there is a 0% residual stenosis.  Mid RCA-2 lesion is 70% stenosed.  A drug-eluting stent was successfully placed using a STENT RESOLUTE ONYX 2.5X12.  Post intervention, there is a 0% residual stenosis.  Conclusion Diagnostic cardiac cath because of unstable angina Left ventricular function between 50 and 55% Coronaries left main minor irregularities LAD Minor irregularities Circumflex also minor irregularities RCA large multiple stents proximal to distal in-stent 95% in the mid segment Intervention Successful PCI and stent to mid RCA with a 2.75 x 15 mm resolute Onyx Postdilated with a 3.0 x 8 mm Paden trek Possible edge dissection so we placed a 2.5 x 15 mm resolute Onyx to cover that area and what appeared to be a 70% lesion The stent balloon was used to smooth out the transition between both stent Conclusion successful PCI and stent of in-stent restenosis with 2 overlapping stents TIMI-3 flow was maintained throughout the procedure at the end of the case there was 0% lesion   US Lower Ext Art Right Ltd  Result Date: 02/27/2021 CLINICAL DATA:  Right groin bleeding, cardiac catheterization week ago EXAM: RIGHT LOWER EXTREMITY ARTERIAL DUPLEX SCAN TECHNIQUE: Gray-scale sonography as well as color Doppler and duplex ultrasound was performed to evaluate the right common femoral vasculature for pseudoaneurysm evaluation. COMPARISON:  None. FINDINGS: Right lower Extremity Anterior to the right common femoral artery, there is a complex mixed echogenicity fluid collection measuring 4.1 x 3.0 x 3.3 cm. With color and Doppler evaluation there is  pulsatile flow and a small vascular neck leading to the abnormality. There is thrombus within the central aspect of the collection. Findings are compatible with a femoral pseudoaneurysm. Femoral artery and vein remain patent. IMPRESSION: Positive exam for 4.1   cm femoral pseudoaneurysm. Electronically Signed   By: Jerilynn Mages.  Shick M.D.   On: 02/27/2021 16:49     Assessment/Plan 1. Pseudoaneurysm [facility]) Patient has a documented pseudoaneurysm status post coronary intervention.  Given these findings I have recommended thrombin injection.  Risk and benefits have been reviewed all questions are answered patient agrees to proceed. - Procedural/ Surgical Case Request: PSEUDOANERYSM COMPRESSION; Future  2. Coronary artery disease of native artery of native heart with stable angina pectoris [facility]) Patient will require treatment of her pseudoaneurysm.  In the meantime she will continue her dual antiplatelet therapy.  3. Essential hypertension Continue antihypertensive medications as already ordered, these medications have been reviewed and there are no changes at this time.  4. Diabetes mellitus without complication ([place]) Continue hypoglycemic medications as already ordered, these medications have been reviewed and there are no changes at this time.  Hgb A1C to be monitored as already arranged by primary service   5. Hyperlipidemia associated with type 2 diabetes mellitus ([place]) Continue statin as ordered and reviewed, no changes at this time     Hortencia Pilar, MD  02/27/2021 5:31 PM

## 2021-02-27 NOTE — Discharge Instructions (Signed)
Pseudoaneurysm  An aneurysm is a bulge in an artery. A pseudoaneurysm happens when an artery is injured and blood leaks out and forms a sac-like bulge in the surrounding tissues. What are the causes? The most common cause of this condition is a procedure called an angiogram. During this procedure, a small, thin tube (catheter) is inserted into an artery. After an angiogram, the insertion site on the artery should close back up all the way. If it does not, blood may leak out of the artery. Other causes of a pseudoaneurysm include:  Trauma to the walls of an artery, such as from a stabbing injury or a deep cut.  Bypass artery grafting surgery, which is a type of surgery that makes blood flow to the heart better.  An infection that affects the walls of an artery.  A heart attack (myocardial infarction). What are the signs or symptoms? Symptoms of this condition include:  Pain, soreness, or tenderness at the site of the pseudoaneurysm.  Swelling.  Bruising or a change in skin color.  A throbbing mass or lump at the site. How is this diagnosed? This condition may be diagnosed based on:  Your symptoms.  A physical exam.  An imaging test called a Doppler ultrasound. This imaging test uses sound waves to show the blood flow in the arteries and the pseudoaneurysm. How is this treated? This condition may go away on its own without treatment. To help prevent bleeding that cannot be controlled, or to help prevent other problems, your health care provider may suggest one of these treatments:  Injecting a blood-clotting enzyme, such as thrombin, into the site.  Fixing the artery with surgery.  Putting pressure (compression) on the pseudoaneurysm. Follow these instructions at home:  Take over-the-counter and prescription medicines only as told by your health care provider.  Return to your normal activities as told by your health care provider. Ask your health care provider what  activities are safe for you.  Keep all follow-up visits as told by your health care provider. This is important. Contact a health care provider if:  Your pain, soreness, or tenderness at the pseudoaneurysm site keeps getting worse.  You have swelling at the site. Get help right away if:  You have severe or ongoing (persistent) pain at the site of the pseudoaneurysm.  There is bleeding or drainage from the site.  The part of your body where the pseudoaneurysm is located changes color or becomes painful, cold, or numb.  You have chest pain or shortness of breath.  You feel like you might faint or you faint. Summary  A pseudoaneurysm happens when an artery is injured and blood leaks out to form a sac-like bulge.  The most common cause of this condition is a procedure called an angiogram in which a thin tube (catheter) is inserted into an artery.  This condition may go away on its own without treatment.  Take over-the-counter and prescription medicines only as told by your health care provider.  Get help right away if the part of your body where the pseudoaneurysm is located changes color or becomes painful, cold, or numb. This information is not intended to replace advice given to you by your health care provider. Make sure you discuss any questions you have with your health care provider. Document Revised: 08/10/2018 Document Reviewed: 08/10/2018 Elsevier Patient Education  2021 [employer].  Hematoma A hematoma is a collection of blood. A hematoma can happen:  Under the skin.  In an organ.  In a body space.  In a joint space.  In other tissues. The blood can thicken (clot) to form a lump that you can see and feel. The lump is often hard and may become sore and tender. The lump can be very small or very big. Most hematomas get better in a few days to weeks. However, some hematomas may be serious and need medical care. What are the causes? This condition is caused  by:  An injury.  Blood that leaks under the skin.  Problems from surgeries.  Medical conditions that cause bleeding or bruising. What increases the risk? You are more likely to develop this condition if:  You are an older adult.  You use medicines that thin your blood. What are the signs or symptoms? Symptoms depend on where the hematoma is in your body.  If the hematoma is under the skin, there is: ? A firm lump on the body. ? Pain and tenderness in the area. ? Bruising. The skin above the lump may be blue, dark blue, purple-red, or yellowish.  If the hematoma is deep in the tissues or body spaces, there may be: ? Blood in the stomach. This may cause pain in the belly (abdomen), weakness, passing out (fainting), and shortness of breath. ? Blood in the head. This may cause a headache, weakness, trouble speaking or understanding speech, or passing out.   How is this diagnosed? This condition is diagnosed based on:  Your medical history.  A physical exam.  Imaging tests, such as ultrasound or CT scan.  Blood tests. How is this treated? Treatment depends on the cause, size, and location of the hematoma. Treatment may include:  Doing nothing. Many hematomas go away on their own without treatment.  Surgery or close monitoring. This may be needed for large hematomas or hematomas that affect the body's organs.  Medicines. These may be given if a medical condition caused the hematoma. Follow these instructions at home: Managing pain, stiffness, and swelling  If told, put ice on the area. ? Put ice in a plastic bag. ? Place a towel between your skin and the bag. ? Leave the ice on for 20 minutes, 2-3 times a day for the first two days.  If told, put heat on the affected area after putting ice on the area for two days. Use the heat source that your doctor tells you to use. This could be a moist heat pack or a heating pad. To do this: ? Place a towel between your skin and  the heat source. ? Leave the heat on for 20-30 minutes. ? Remove the heat if your skin turns bright red. This is very important if you are unable to feel pain, heat, or cold. You may have a greater risk of getting burned.  Raise (elevate) the affected area above the level of your heart while you are sitting or lying down.  Wrap the affected area with an elastic bandage, if told by your doctor. Do not wrap the bandage too tightly.  If your hematoma is on a leg or foot and is painful, your doctor may give you crutches. Use them as told by your doctor.   General instructions  Take over-the-counter and prescription medicines only as told by your doctor.  Keep all follow-up visits as told by your doctor. This is important. Contact a doctor if:  You have a fever.  The swelling or bruising gets worse.  You start to get more hematomas. Get help right  away if:  Your pain gets worse.  Your pain is not getting better with medicine.  Your skin over the hematoma breaks or starts to bleed.  Your hematoma is in your chest or belly and you: ? Pass out. ? Feel weak. ? Become short of breath.  You have a hematoma on your scalp that is caused by a fall or injury, and you: ? Have a headache that gets worse. ? Have trouble speaking or understanding speech. ? Become less alert or you pass out. Summary  A hematoma is a collection of blood in any part of your body.  Most hematomas get better on their own in a few days to weeks. Some may need medical care.  Follow instructions from your doctor about how to care for your hematoma.  Contact a doctor if the swelling or bruising gets worse, or if you are short of breath. This information is not intended to replace advice given to you by your health care provider. Make sure you discuss any questions you have with your health care provider. Document Revised: 04/07/2018 Document Reviewed: 04/07/2018 Elsevier Patient Education  2021 [place].  Puncture Wound A puncture wound is an injury that is caused by a sharp, thin object that goes through your skin. A puncture wound usually does not leave a large opening in your skin, so it may not bleed a lot. However, when you get a puncture wound, dirt or other materials (foreign bodies) can be forced into your wound and can break off inside. This increases the chance of infection, such as tetanus. There are many sharp, pointed objects that can cause puncture wounds, including teeth, nails, splinters of glass, fishhooks, and needles. Treatment may include the following steps:  Washing out the wound with a germ-free (sterile) salt-water solution.  Having surgery to open the wound and remove materials from it.  Closing the wound with stitches (sutures).  Covering the wound with antibiotic ointment and a bandage (dressing). Depending on what caused the injury, you may also need a tetanus shot or a rabies shot. Follow these instructions at home: Medicines  Take or apply over-the-counter and prescription medicines only as told by your doctor.  If you were prescribed an antibiotic medicine, take or apply it as told by your doctor. Do not stop using the antibiotic even if your condition starts to get better. Bathing  Keep the bandage dry as told by your doctor.  Do not take baths, swim, or use a hot tub until your doctor approves. Ask your doctor if you may take showers. You may only be allowed to take sponge baths. Wound care  There are many ways to close and cover a wound. For example, a wound can be closed with stitches, skin glue, or skin tape (adhesive strips). Follow instructions from your doctor about how to take care of your wound. Make sure you: ? Wash your hands with soap and water before and after you change your bandage. If you cannot use soap and water, use hand sanitizer. ? Change your bandage as told by your doctor. ? Leave stitches, skin glue, or skin tape strips in  place. They may need to stay in place for 2 weeks or longer. If tape strips get loose and curl up, you may trim the loose edges. Do not remove tape strips completely unless your doctor says it is okay.  Clean the wound as told by your doctor.  Do not scratch or pick at the wound.  Check  your wound every day for signs of infection. Watch for: ? Redness, swelling, or pain. ? Fluid or blood. ? Warmth. ? Pus or a bad smell.   General instructions  Raise (elevate) the injured area above the level of your heart while you are sitting or lying down.  If your puncture wound is in your foot, ask your doctor if you need to avoid putting weight on your foot and for how long. Use crutches as told by your doctor.  Keep all follow-up visits as told by your doctor. This is important. Contact a doctor if:  You got a tetanus shot and you have any of these problems at the injection site: ? Swelling. ? Very bad pain. ? Redness. ? Bleeding.  You have a fever.  Your stitches come out.  You notice a bad smell coming from your wound or your bandage.  You notice something coming out of the wound, such as wood or glass.  Medicine does not help your pain.  You have more redness, swelling, or pain at the site of your wound.  You have fluid, blood, or pus coming from your wound.  You notice a change in the color of your skin near your wound.  You need to change the bandage often because fluid, blood, or pus is coming from the wound.  You start to have a new rash.  You start to lose feeling (have numbness) around the wound.  You have warmth around your wound. Get help right away if:  You have very bad swelling around the wound.  Your pain quickly gets worse and is very bad.  You start to get painful skin lumps.  You have a red streak going away from your wound.  The wound is on your hand or foot and you: ? Cannot move a finger or toe like normal. ? Notice that your fingers or toes look  pale or blue. Summary  A puncture wound is an injury that is caused by a sharp, thin object that goes through your skin.  Treatment may include washing out the wound, having surgery to open the wound to clean it, closing the wound, and covering the wound with a bandage.  Follow instructions from your doctor about how to take care of your wound.  Contact your doctor if you have more redness, swelling, or pain at the site of your wound.  Keep all follow-up visits as told by your doctor. This is important. This information is not intended to replace advice given to you by your health care provider. Make sure you discuss any questions you have with your health care provider. Document Revised: 03/12/2020 Document Reviewed: 06/09/2018 Elsevier Patient Education  2021 [employer].

## 2021-02-27 NOTE — Op Note (Signed)
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 Thrombin injection of pseudoaneurysm under ultrasound guidance   INTRAVENOUS FLUIDS: None   ESTIMATED BLOOD LOSS: None   COMPLICATIONS: None apparent   INDICATIONS FOR PROCEDURE:  Patient is a  75 y.o. female recently underwent coronary catheterization. Post-procedure, patient developed severe Right groin pain and ecchymosis. Duplex ultrasound confirmed a pseudoaneurysm with neck originating from the right common femoral artery. Sac size and neck measurements appear amenable to thrombin injection of the pseudoaneurysm. All risks, benefits, and alternatives to above elective procedure were discussed with the patient, who elected to proceed, and informed consent was obtained at that time.   DESCRIPTION OF PROCEDURE:  Patient was brought to the vascular lab, appropriately identified, and placed on table/bed in supine position with the right groin prepped and draped in usual sterile fashion. Access site was anesthetized with lidocaine. Under direct ultrasound visualization, a needle was introduced into the main cavity of the right multilobed pseudoaneurysm and confirmed by injection of saline with color doppler flow visualized.  1 mL of Thrombin (5000 units) was injected until color flow ceased within the cavity.  Patency of CFV and arteries noted on ultrasound with no changes to distal pulses noted on exam. Sterile dressing was applied, and patient was returned to her room for observation and bed rest 30 minutes.   A repeat arterial duplex ultrasound will be performed in the office and an order for follow-up has been placed  in the computer.

## 2021-02-27 NOTE — H&P (View-Only) (Signed)
MRN : [id number]  Stacie Mcdonald is a 75 y.o. (1946-04-28) female who presents with chief complaint of No chief complaint on file. Marland Kitchen  History of Present Illness:   I am asked to evaluate the patient by Dr. [place] Sink.  Patient is a 75 year old woman who underwent cardiac catheterization with coronary angioplasty and stent placement on 02/20/2021.  The patient noted increasing pain in her right groin which prompted a duplex ultrasound done today.  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  Current Meds  Medication Sig  . thrombin 5000 units KIT Apply 5,000 Units topically once for 1 dose.    Past Medical History:  Diagnosis Date  . Allergy   . Arthritis    fingers, knees  . Asthma   . Breast Cancer 1991   R mastectomy. XRT , chemo  . Breast cancer ([place]) 1991   RT MASTECTOMY  . CAD (coronary artery disease) 2008   50% LAD occlusion 2009,  stent in mid LAD 2008 for 95%   . Chicken pox   . Colon polyps April 2009   by Colonoscopy, Gustavo Lah  . GERD (gastroesophageal reflux disease)   . Heart disease   . Hyperlipidemia   . Hypertension   . Myocardial infarction ([place])    silent. discovered through testing  . 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 Rheumatic fever   . Sleep apnea    CPAP  . Stable angina ([place]) 07/27/2019  . Urinary tract infection   . Wears contact lenses    left only    Past Surgical History:  Procedure Laterality Date  . BREAST BIOPSY     right breast reconstruction S/P mastectomy  . BREAST SURGERY     right mastectomy  . CATARACT EXTRACTION W/PHACO Left 05/01/2020   Procedure: CATARACT EXTRACTION PHACO AND INTRAOCULAR LENS PLACEMENT (IOC) LEFT 5.45  00:48.6  11.2%;  Surgeon: Leandrew Koyanagi, MD;  Location: [facility];  Service: Ophthalmology;  Laterality: Left;  sleep apnea  . COLONOSCOPY WITH PROPOFOL N/A 08/18/2019   Procedure: COLONOSCOPY WITH PROPOFOL;  Surgeon: [name], MD;  Location: [facility];  Service: Endoscopy;  Laterality: N/A;  sleep apnea  . CORONARY ANGIOPLASTY WITH STENT PLACEMENT  Jan 2013   mid RCA 75% occlusion resolved to 0%, [place]  . CORONARY STENT INTERVENTION N/A 04/07/2018   Procedure: CORONARY STENT INTERVENTION;  Surgeon: Yolonda Kida, MD;  Location: [facility];  Service: Cardiovascular;  Laterality: N/A;  . CORONARY STENT INTERVENTION N/A 02/20/2021   Procedure: CORONARY STENT INTERVENTION;  Surgeon: Yolonda Kida, MD;  Location: [facility];  Service: Cardiovascular;  Laterality: N/A;  . LEFT HEART CATH AND CORONARY ANGIOGRAPHY N/A 02/20/2021   Procedure: LEFT HEART CATH AND CORONARY ANGIOGRAPHY;  Surgeon: Yolonda Kida, MD;  Location: [facility];  Service: Cardiovascular;  Laterality: N/A;  . MASTECTOMY Right 1991   BREAST CA  . POLYPECTOMY  08/18/2019   Procedure: POLYPECTOMY;  Surgeon: [name], MD;  Location: [facility];  Service: Endoscopy;;  . RIGHT/LEFT HEART CATH AND CORONARY ANGIOGRAPHY N/A 04/07/2018   Procedure: RIGHT/LEFT HEART CATH AND CORONARY ANGIOGRAPHY;  Surgeon: Yolonda Kida, MD;  Location: [facility];  Service: Cardiovascular;  Laterality: N/A;  . TONSILLECTOMY      Social History Social History   Tobacco Use  . Smoking status: Never Smoker  . Smokeless tobacco: Never Used  Vaping Use  . Vaping Use: Never used  Substance Use Topics  . Alcohol use: Yes    Alcohol/week: 1.0 standard drink    Types: 1 Glasses of wine per week    Comment: occasional wine  . Drug use: No    Family History Family History  Problem Relation Age of Onset  . Heart disease Mother 68       massive MI  . Heart disease Father 45       AMI  . Cancer Father        metastatic lung CA  . Diabetes Sister   . Cancer Maternal Aunt   . Breast cancer Maternal Aunt 50  . Breast cancer Cousin        40s  . Breast cancer Cousin        40s  No family history of bleeding/clotting  disorders, porphyria or autoimmune disease   Allergies  Allergen Reactions  . Penicillins Hives    Has patient had a PCN reaction causing immediate rash, facial/tongue/throat swelling, SOB or lightheadedness with hypotension: No Has patient had a PCN reaction causing severe rash involving mucus membranes or skin necrosis: Yes Has patient had a PCN reaction that required hospitalization: No Has patient had a PCN reaction occurring within the last 10 years: Yes If all of the above answers are "NO", then may proceed with Cephalosporin use.   . Statins     Other reaction(s): Muscle Pain     REVIEW OF SYSTEMS (Negative unless checked)  Constitutional: []Weight loss  []Fever  []Chills Cardiac: []Chest pain   []Chest pressure   []Palpitations   []Shortness of breath when laying flat   []Shortness of breath with exertion. Vascular:  []Pain in legs with walking   []Pain in legs at rest  []History of DVT   []Phlebitis   []Swelling in legs   []Varicose veins   []Non-healing ulcers Pulmonary:   []Uses home oxygen   []Productive cough   []Hemoptysis   []Wheeze  []COPD   []Asthma Neurologic:  []Dizziness   []Seizures   []History of stroke   []History of TIA  []Aphasia   []Vissual changes   []Weakness or numbness in arm   []Weakness or numbness in leg Musculoskeletal:   []Joint swelling   []Joint pain   []Low back pain Hematologic:  []Easy bruising  []Easy bleeding   []Hypercoagulable state   []Anemic Gastrointestinal:  []Diarrhea   []Vomiting  []Gastroesophageal reflux/heartburn   []Difficulty swallowing. Genitourinary:  []Chronic kidney disease   []Difficult urination  []Frequent urination   []Blood in urine Skin:  []Rashes   []Ulcers  Psychological:  []History of anxiety   [] History of major depression.  Physical Examination  There were no vitals filed for this visit. There is no height or weight on file to calculate BMI. Gen: WD/WN, NAD Head: [place]/AT, No temporalis wasting.  Ear/Nose/Throat:  Hearing grossly intact, nares w/o erythema or drainage, poor dentition Eyes: PER, EOMI, sclera nonicteric.  Neck: Supple, no masses.  No bruit or JVD.  Pulmonary:  Good air movement, clear to auscultation bilaterally, no use of accessory muscles.  Cardiac: RRR, normal S1, S2, no Murmurs. Vascular: Palpable mass right groin consistent with hematoma/pseudoaneurysm Vessel Right Left  Radial Palpable Palpable  Femoral Palpable Palpable  PT Palpable Palpable  DP Palpable Palpable  Gastrointestinal: soft, non-distended. No guarding/no peritoneal signs.  Musculoskeletal: M/S 5/5 throughout.  No deformity or atrophy.  Neurologic: CN 2-12 intact. Pain and light touch intact in extremities.    Symmetrical.  Speech is fluent. Motor exam as listed above. Psychiatric: Judgment intact, Mood & affect appropriate for pt's clinical situation. Dermatologic: No rashes or ulcers noted.  No changes consistent with cellulitis.   CBC Lab Results  Component Value Date   WBC 8.4 02/21/2021   HGB 12.7 02/21/2021   HCT 37.2 02/21/2021   MCV 90.7 02/21/2021   PLT 216 02/21/2021    BMET    Component Value Date/Time   NA 137 02/21/2021 1057   NA 141 07/27/2019 0000   NA 139 12/25/2013 0808   K 3.9 02/21/2021 1057   K 4.0 12/25/2013 0808   CL 107 02/21/2021 1057   CL 107 12/25/2013 0808   CO2 22 02/21/2021 1057   CO2 24 12/25/2013 0808   GLUCOSE 117 (H) 02/21/2021 1057   GLUCOSE 111 (H) 12/25/2013 0808   BUN 16 02/21/2021 1057   BUN 20 07/27/2019 0000   BUN 17 12/25/2013 0808   CREATININE 0.81 02/21/2021 1057   CREATININE 0.55 (L) 12/25/2013 0808   CALCIUM 8.9 02/21/2021 1057   CALCIUM 8.8 12/25/2013 0808   GFRNONAA >60 02/21/2021 1057   GFRNONAA >60 12/25/2013 0808   GFRAA >60 04/15/2019 0835   GFRAA >60 12/25/2013 0808   Estimated Creatinine Clearance: 62.4 mL/min (by C-G formula based on SCr of 0.81 mg/dL).  COAG Lab Results  Component Value Date   INR 1.0 09/27/2012     Radiology CARDIAC CATHETERIZATION  Result Date: 02/21/2021  Non-stenotic Ost LAD to Prox LAD lesion was previously treated.  Mid LAD lesion is 50% stenosed.  Previously placed Dist RCA drug eluting stent is widely patent.  A drug-eluting stent was successfully placed using a STENT RESOLUTE ONYX 2.75X15.  Mid RCA-1 lesion is 95% stenosed.  Post intervention, there is a 0% residual stenosis.  Mid RCA-2 lesion is 70% stenosed.  A drug-eluting stent was successfully placed using a STENT RESOLUTE ONYX 2.5X12.  Post intervention, there is a 0% residual stenosis.  Conclusion Diagnostic cardiac cath because of unstable angina Left ventricular function between 50 and 55% Coronaries left main minor irregularities LAD Minor irregularities Circumflex also minor irregularities RCA large multiple stents proximal to distal in-stent 95% in the mid segment Intervention Successful PCI and stent to mid RCA with a 2.75 x 15 mm resolute Onyx Postdilated with a 3.0 x 8 mm [place] Possible edge dissection so we placed a 2.5 x 15 mm resolute Onyx to cover that area and what appeared to be a 70% lesion The stent balloon was used to smooth out the transition between both stent Conclusion successful PCI and stent of in-stent restenosis with 2 overlapping stents TIMI-3 flow was maintained throughout the procedure at the end of the case there was 0% lesion   US Lower Ext Art Right Ltd  Result Date: 02/27/2021 CLINICAL DATA:  Right groin bleeding, cardiac catheterization week ago EXAM: RIGHT LOWER EXTREMITY ARTERIAL DUPLEX SCAN TECHNIQUE: Gray-scale sonography as well as color Doppler and duplex ultrasound was performed to evaluate the right common femoral vasculature for pseudoaneurysm evaluation. COMPARISON:  None. FINDINGS: Right lower Extremity Anterior to the right common femoral artery, there is a complex mixed echogenicity fluid collection measuring 4.1 x 3.0 x 3.3 cm. With color and Doppler evaluation there is  pulsatile flow and a small vascular neck leading to the abnormality. There is thrombus within the central aspect of the collection. Findings are compatible with a femoral pseudoaneurysm. Femoral artery and vein remain patent. IMPRESSION: Positive exam for 4.1   cm femoral pseudoaneurysm. Electronically Signed   By: Jerilynn Mages.  Shick M.D.   On: 02/27/2021 16:49     Assessment/Plan 1. Pseudoaneurysm [facility]) Patient has a documented pseudoaneurysm status post coronary intervention.  Given these findings I have recommended thrombin injection.  Risk and benefits have been reviewed all questions are answered patient agrees to proceed. - Procedural/ Surgical Case Request: PSEUDOANERYSM COMPRESSION; Future  2. Coronary artery disease of native artery of native heart with stable angina pectoris [facility]) Patient will require treatment of her pseudoaneurysm.  In the meantime she will continue her dual antiplatelet therapy.  3. Essential hypertension Continue antihypertensive medications as already ordered, these medications have been reviewed and there are no changes at this time.  4. Diabetes mellitus without complication ([place]) Continue hypoglycemic medications as already ordered, these medications have been reviewed and there are no changes at this time.  Hgb A1C to be monitored as already arranged by primary service   5. Hyperlipidemia associated with type 2 diabetes mellitus ([place]) Continue statin as ordered and reviewed, no changes at this time     Hortencia Pilar, MD  02/27/2021 5:31 PM

## 2021-02-28 ENCOUNTER — Encounter: Payer: Self-pay | Admitting: Vascular Surgery

## 2021-03-03 ENCOUNTER — Ambulatory Visit (INDEPENDENT_AMBULATORY_CARE_PROVIDER_SITE_OTHER): Payer: Medicare Other | Admitting: Vascular Surgery

## 2021-03-03 ENCOUNTER — Other Ambulatory Visit: Payer: Self-pay

## 2021-03-03 ENCOUNTER — Encounter (INDEPENDENT_AMBULATORY_CARE_PROVIDER_SITE_OTHER): Payer: Self-pay | Admitting: Vascular Surgery

## 2021-03-03 ENCOUNTER — Ambulatory Visit (INDEPENDENT_AMBULATORY_CARE_PROVIDER_SITE_OTHER): Payer: Medicare Other

## 2021-03-03 ENCOUNTER — Other Ambulatory Visit (INDEPENDENT_AMBULATORY_CARE_PROVIDER_SITE_OTHER): Payer: Self-pay | Admitting: Vascular Surgery

## 2021-03-03 VITALS — BP 150/85 | HR 83 | Resp 16 | Wt 190.0 lb

## 2021-03-03 DIAGNOSIS — I25118 Atherosclerotic heart disease of native coronary artery with other forms of angina pectoris: Secondary | ICD-10-CM | POA: Diagnosis not present

## 2021-03-03 DIAGNOSIS — E119 Type 2 diabetes mellitus without complications: Secondary | ICD-10-CM

## 2021-03-03 DIAGNOSIS — I729 Aneurysm of unspecified site: Secondary | ICD-10-CM | POA: Diagnosis not present

## 2021-03-03 DIAGNOSIS — I10 Essential (primary) hypertension: Secondary | ICD-10-CM | POA: Diagnosis not present

## 2021-03-03 NOTE — Progress Notes (Signed)
MRN : [id number]  Stacie Mcdonald is a 75 y.o. (16-Feb-1946) female who presents with chief complaint of  Chief Complaint  Patient presents with  . Follow-up    [facility] follow up post right groin pseudoaneurysm compression  .  History of Present Illness:  Patient reports her right thigh and groin area is feeling much better.  She does note some ecchymoses.  The intense pain that she was having Thursday when her pseudoaneurysm was active has now resolved  Current Meds  Medication Sig  . aspirin 81 MG tablet Take 81 mg by mouth daily.  . clopidogrel (PLAVIX) 75 MG tablet TAKE ONE TABLET BY MOUTH DAILY (Patient taking differently: Take 75 mg by mouth daily.)  . D-MANNOSE PO Take 800 mg by mouth daily.  Marland Kitchen ELDERBERRY PO Take 1,000 mg by mouth daily.  . furosemide (LASIX) 40 MG tablet Take 40 mg by mouth daily.  Marland Kitchen GLUCOSAMINE-CHONDROITIN PO Take 2 tablets by mouth daily.  . isosorbide mononitrate (IMDUR) 120 MG 24 hr tablet Take 120 mg by mouth daily.   . Lancets (FREESTYLE) lancets uSE TO CHECK SUGARS ONCE DAILY e11.9  . loratadine (CLARITIN) 10 MG tablet Take 10 mg by mouth daily as needed for allergies.  . metoprolol (TOPROL-XL) 200 MG 24 hr tablet TAKE 1 TABLET BY MOUTH DAILY. (Patient taking differently: Take 200 mg by mouth daily. TAKE 1 TABLET BY MOUTH DAILY.)  . nitroGLYCERIN (NITROSTAT) 0.4 MG SL tablet Place 0.4 mg under the tongue every 5 (five) minutes as needed for chest pain.  Marland Kitchen omeprazole (PRILOSEC) 20 MG capsule Take 1 capsule (20 mg total) by mouth daily. (Patient taking differently: Take 20 mg by mouth daily as needed (acid reflux).)  . ONE TOUCH ULTRA TEST test strip CHECK DAILY  . OVER THE COUNTER MEDICATION Take 50 mg by mouth daily. Blood sugar support with Pyerogel  . PRALUENT 75 MG/ML SOAJ Inject 75 mg into the muscle every 14 (fourteen) days.  . ranolazine (RANEXA) 500 MG 12 hr tablet Take 500 mg by mouth 2 (two) times daily.  . sacubitril-valsartan (ENTRESTO) 49-51  MG Take 1 tablet by mouth 2 (two) times daily.  Marland Kitchen spironolactone (ALDACTONE) 25 MG tablet Take 1 tablet (25 mg total) by mouth daily.    Past Medical History:  Diagnosis Date  . Allergy   . Arthritis    fingers, knees  . Asthma   . Breast Cancer 1991   R mastectomy. XRT , chemo  . Breast cancer ([place]) 1991   RT MASTECTOMY  . CAD (coronary artery disease) 2008   50% LAD occlusion 2009,  stent in mid LAD 2008 for 95%   . Chicken pox   . Colon polyps April 2009   by Colonoscopy, Gustavo Lah  . GERD (gastroesophageal reflux disease)   . Heart disease   . Hyperlipidemia   . Hypertension   . Myocardial infarction ([place])    silent. discovered through testing  . 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 Rheumatic fever   . Sleep apnea    CPAP  . Stable angina ([place]) 07/27/2019  . Urinary tract infection   . Wears contact lenses    left only    Past Surgical History:  Procedure Laterality Date  . BREAST BIOPSY     right breast reconstruction S/P mastectomy  . BREAST SURGERY     right mastectomy  . CATARACT EXTRACTION W/PHACO Left 05/01/2020   Procedure: CATARACT EXTRACTION  PHACO AND INTRAOCULAR LENS PLACEMENT (IOC) LEFT 5.45  00:48.6  11.2%;  Surgeon: Leandrew Koyanagi, MD;  Location: [place];  Service: Ophthalmology;  Laterality: Left;  sleep apnea  . COLONOSCOPY WITH PROPOFOL N/A 08/18/2019   Procedure: COLONOSCOPY WITH PROPOFOL;  Surgeon: [name], MD;  Location: [place];  Service: Endoscopy;  Laterality: N/A;  sleep apnea  . CORONARY ANGIOPLASTY WITH STENT PLACEMENT  Jan 2013   mid RCA 75% occlusion resolved to 0%, [place]  . CORONARY STENT INTERVENTION N/A 04/07/2018   Procedure: CORONARY STENT INTERVENTION;  Surgeon: Yolonda Kida, MD;  Location: [place] [facility];  Service: Cardiovascular;  Laterality: N/A;  . CORONARY STENT INTERVENTION N/A 02/20/2021   Procedure: CORONARY STENT INTERVENTION;  Surgeon: Yolonda Kida, MD;  Location: [place] [facility];  Service: Cardiovascular;  Laterality: N/A;  . LEFT HEART CATH AND CORONARY ANGIOGRAPHY N/A 02/20/2021   Procedure: LEFT HEART CATH AND CORONARY ANGIOGRAPHY;  Surgeon: Yolonda Kida, MD;  Location: [place] [facility];  Service: Cardiovascular;  Laterality: N/A;  . MASTECTOMY Right 1991   BREAST CA  . POLYPECTOMY  08/18/2019   Procedure: POLYPECTOMY;  Surgeon: [name], MD;  Location: [place];  Service: Endoscopy;;  . [place] COMPRESSION N/A 02/27/2021   Procedure: [place] COMPRESSION;  Surgeon: Katha Cabal, MD;  Location: [place] [facility];  Service: Cardiovascular;  Laterality: N/A;  . RIGHT/LEFT HEART CATH AND CORONARY ANGIOGRAPHY N/A 04/07/2018   Procedure: RIGHT/LEFT HEART CATH AND CORONARY ANGIOGRAPHY;  Surgeon: Yolonda Kida, MD;  Location: [place] [facility];  Service: Cardiovascular;  Laterality: N/A;  . TONSILLECTOMY      Social History Social History   Tobacco Use  . Smoking status: Never Smoker  . Smokeless tobacco: Never Used  Vaping Use  . Vaping Use: Never used  Substance Use Topics  . Alcohol use: Yes    Alcohol/week: 1.0 standard drink    Types: 1 Glasses of wine per week    Comment: occasional wine  . Drug use: No    Family History Family History  Problem Relation Age of Onset  . Heart disease Mother 43       massive MI  . Heart disease Father 8       AMI  . Cancer Father        metastatic lung CA  . Diabetes Sister   . Cancer Maternal Aunt   . Breast cancer Maternal Aunt 26  . Breast cancer Cousin        55s  . Breast cancer Cousin        40s    Allergies  Allergen Reactions  . Penicillins Hives    Has patient had a PCN reaction causing immediate rash, facial/tongue/throat swelling, SOB or lightheadedness with hypotension: No Has patient had a PCN reaction causing severe rash involving mucus membranes or skin necrosis: Yes Has patient had a PCN reaction that  required hospitalization: No Has patient had a PCN reaction occurring within the last 10 years: Yes If all of the above answers are "NO", then may proceed with Cephalosporin use.   . Statins     Other reaction(s): Muscle Pain     REVIEW OF SYSTEMS (Negative unless checked)  Constitutional: [] Weight loss  [] Fever  [] Chills Cardiac: [] Chest pain   [] Chest pressure   [] Palpitations   [] Shortness of breath when laying flat   [] Shortness of breath with exertion. Vascular:  [] Pain in legs with walking   []   Pain in legs at rest  [] History of DVT   [] Phlebitis   [] Swelling in legs   [] Varicose veins   [] Non-healing ulcers Pulmonary:   [] Uses home oxygen   [] Productive cough   [] Hemoptysis   [] Wheeze  [] COPD   [] Asthma Neurologic:  [] Dizziness   [] Seizures   [] History of stroke   [] History of TIA  [] Aphasia   [] Vissual changes   [] Weakness or numbness in arm   [] Weakness or numbness in leg Musculoskeletal:   [] Joint swelling   [] Joint pain   [] Low back pain Hematologic:  [] Easy bruising  [] Easy bleeding   [] Hypercoagulable state   [] Anemic Gastrointestinal:  [] Diarrhea   [] Vomiting  [] Gastroesophageal reflux/heartburn   [] Difficulty swallowing. Genitourinary:  [] Chronic kidney disease   [] Difficult urination  [] Frequent urination   [] Blood in urine Skin:  [] Rashes   [] Ulcers  Psychological:  [] History of anxiety   []  History of major depression.  Physical Examination  Vitals:   03/03/21 1357  BP: (!) 150/85  Pulse: 83  Resp: 16  Weight: 190 lb (86.2 kg)   Body mass index is 32.61 kg/m. Gen: WD/WN, NAD Head: [place]/AT, No temporalis wasting.  Ear/Nose/Throat: Hearing grossly intact, nares w/o erythema or drainage Eyes: PER, EOMI, sclera nonicteric.  Neck: Supple, no large masses.   Pulmonary:  Good air movement, no audible wheezing bilaterally, no use of accessory muscles.  Cardiac: RRR, no JVD Vascular: Right groin with ecchymoses palpable hematoma groin is non-pulsatile Vessel Right Left   Radial Palpable Palpable  Gastrointestinal: Non-distended. No guarding/no peritoneal signs.  Musculoskeletal: M/S 5/5 throughout.  No deformity or atrophy.  Neurologic: CN 2-12 intact. Symmetrical.  Speech is fluent. Motor exam as listed above. Psychiatric: Judgment intact, Mood & affect appropriate for pt's clinical situation. Dermatologic: No rashes or ulcers noted.  No changes consistent with cellulitis. Lymph : No lichenification or skin changes of chronic lymphedema.  CBC Lab Results  Component Value Date   WBC 8.4 02/21/2021   HGB 12.7 02/21/2021   HCT 37.2 02/21/2021   MCV 90.7 02/21/2021   PLT 216 02/21/2021    BMET    Component Value Date/Time   NA 137 02/21/2021 1057   NA 141 07/27/2019 0000   NA 139 12/25/2013 0808   K 3.9 02/21/2021 1057   K 4.0 12/25/2013 0808   CL 107 02/21/2021 1057   CL 107 12/25/2013 0808   CO2 22 02/21/2021 1057   CO2 24 12/25/2013 0808   GLUCOSE 117 (H) 02/21/2021 1057   GLUCOSE 111 (H) 12/25/2013 0808   BUN 16 02/21/2021 1057   BUN 20 07/27/2019 0000   BUN 17 12/25/2013 0808   CREATININE 0.81 02/21/2021 1057   CREATININE 0.55 (L) 12/25/2013 0808   CALCIUM 8.9 02/21/2021 1057   CALCIUM 8.8 12/25/2013 0808   GFRNONAA >60 02/21/2021 1057   GFRNONAA >60 12/25/2013 0808   GFRAA >60 04/15/2019 0835   GFRAA >60 12/25/2013 0808   Estimated Creatinine Clearance: 62.8 mL/min (by C-G formula based on SCr of 0.81 mg/dL).  COAG Lab Results  Component Value Date   INR 1.0 09/27/2012    Radiology [place] Intraoperative  Result Date: 02/27/2021 CLINICAL DATA:  Ultrasound was provided for use by the ordering physician.  No provider Interpretation or professional fees incurred.    CARDIAC CATHETERIZATION  Result Date: 02/27/2021 See op note  CARDIAC CATHETERIZATION  Result Date: 02/21/2021  Non-stenotic Ost LAD to Prox LAD lesion was previously treated.  Mid LAD lesion is 50% stenosed.  Previously placed Dist RCA drug eluting stent is  widely patent.  A drug-eluting stent was successfully placed using a STENT RESOLUTE ONYX [id number].  Mid RCA-1 lesion is 95% stenosed.  Post intervention, there is a 0% residual stenosis.  Mid RCA-2 lesion is 70% stenosed.  A drug-eluting stent was successfully placed using a STENT RESOLUTE ONYX 2.5X12.  Post intervention, there is a 0% residual stenosis.  Conclusion Diagnostic cardiac cath because of unstable angina Left ventricular function between 50 and 55% Coronaries left main minor irregularities LAD Minor irregularities Circumflex also minor irregularities RCA large multiple stents proximal to distal in-stent 95% in the mid segment Intervention Successful PCI and stent to mid RCA with a 2.75 x 15 mm resolute Onyx Postdilated with a 3.0 x 8 mm [place] trek Possible edge dissection so we placed a 2.5 x 15 mm resolute Onyx to cover that area and what appeared to be a 70% lesion The stent balloon was used to smooth out the transition between both stent Conclusion successful PCI and stent of in-stent restenosis with 2 overlapping stents TIMI-3 flow was maintained throughout the procedure at the end of the case there was 0% lesion   [place] Lower Ext Art Right Ltd  Result Date: 02/27/2021 CLINICAL DATA:  Right groin bleeding, cardiac catheterization week ago EXAM: RIGHT LOWER EXTREMITY ARTERIAL DUPLEX SCAN TECHNIQUE: Gray-scale sonography as well as color Doppler and duplex ultrasound was performed to evaluate the right common femoral vasculature for pseudoaneurysm evaluation. COMPARISON:  None. FINDINGS: Right lower Extremity Anterior to the right common femoral artery, there is a complex mixed echogenicity fluid collection measuring 4.1 x 3.0 x 3.3 cm. With color and Doppler evaluation there is pulsatile flow and a small vascular neck leading to the abnormality. There is thrombus within the central aspect of the collection. Findings are compatible with a femoral pseudoaneurysm. Femoral artery and vein remain  patent. 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 Electronically Signed   By: Jerilynn Mages.  Shick M.D.   On: 02/27/2021 16:49     Assessment/Plan 1. Pseudoaneurysm ([place]) Duplex ultrasound shows the pseudoaneurysm has been well treated there is a small hematoma which is half the size of the pretreatment measurements.  No further procedures or surgeries indicated.  She successful ablation of the pseudoaneurysm  2. Essential hypertension Continue antihypertensive medications as already ordered, these medications have been reviewed and there are no changes at this time.   3. Coronary artery disease of native artery of native heart with stable angina pectoris (HCC) Continue cardiac and antihypertensive medications as already ordered and reviewed, no changes at this time.  Continue statin as ordered and reviewed, no changes at this time  Nitrates PRN for chest pain   4. Diabetes mellitus without complication ([place]) Continue hypoglycemic medications as already ordered, these medications have been reviewed and there are no changes at this time.  Hgb A1C to be monitored as already arranged by primary service     Hortencia Pilar, MD  03/03/2021 2:07 PM

## 2021-03-06 NOTE — Telephone Encounter (Signed)
This Rx was meant for inpatient, please disregard

## 2021-03-10 DIAGNOSIS — I729 Aneurysm of unspecified site: Secondary | ICD-10-CM | POA: Diagnosis not present

## 2021-03-10 DIAGNOSIS — I5042 Chronic combined systolic (congestive) and diastolic (congestive) heart failure: Secondary | ICD-10-CM | POA: Diagnosis not present

## 2021-03-10 DIAGNOSIS — I2510 Atherosclerotic heart disease of native coronary artery without angina pectoris: Secondary | ICD-10-CM | POA: Diagnosis not present

## 2021-03-10 DIAGNOSIS — J449 Chronic obstructive pulmonary disease, unspecified: Secondary | ICD-10-CM | POA: Diagnosis not present

## 2021-03-10 DIAGNOSIS — I10 Essential (primary) hypertension: Secondary | ICD-10-CM | POA: Diagnosis not present

## 2021-03-10 DIAGNOSIS — R000 Tachycardia, unspecified: Secondary | ICD-10-CM | POA: Diagnosis not present

## 2021-03-10 DIAGNOSIS — I208 Other forms of angina pectoris: Secondary | ICD-10-CM | POA: Diagnosis not present

## 2021-03-10 DIAGNOSIS — Z955 Presence of coronary angioplasty implant and graft: Secondary | ICD-10-CM | POA: Diagnosis not present

## 2021-03-10 DIAGNOSIS — E669 Obesity, unspecified: Secondary | ICD-10-CM | POA: Diagnosis not present

## 2021-03-10 DIAGNOSIS — E782 Mixed hyperlipidemia: Secondary | ICD-10-CM | POA: Diagnosis not present

## 2021-03-10 DIAGNOSIS — R0600 Dyspnea, unspecified: Secondary | ICD-10-CM | POA: Diagnosis not present

## 2021-03-18 ENCOUNTER — Encounter: Payer: Self-pay | Admitting: Vascular Surgery

## 2021-03-18 MED ORDER — LIDOCAINE HCL (PF) 1 % IJ SOLN
INTRAMUSCULAR | Status: DC | PRN
  Administered 2021-02-27: 5 mL

## 2021-03-24 NOTE — Discharge Summary (Signed)
Physician Discharge Summary      Patient ID: Stacie Mcdonald MRN: [id number] DOB/AGE: Dec 02, 1945 75 y.o.  Admit date: 02/20/2021 Discharge date: 03/24/2021  Primary Discharge Diagnosis unstable angina dyspnea Secondary Discharge Diagnosis coronary disease status post PCI and stent  Significant Diagnostic Studies: Cardiac cath PCI and stent RCA and yes  Consults: None  Hospital Course: Patient was admitted as an outpatient for outpatient diagnostic cardiac cath because of unstable anginal symptoms diagnostic cardiac cath right groin approach.  Study showed high-grade lesion in the mid RCA she underwent PCI and stent with 2 overlapping DES stents reducing the lesion down to 0 patient had no significant complications during hospitalization medications were essentially unchanged she was already on Plavix patient was scheduled to follow-up as an outpatient 1 to 2 weeks   Discharge Exam: Blood pressure 124/87, pulse 79, temperature 99.2 F (37.3 C), temperature source Oral, resp. rate 16, height 5\' 4"  (1.626 m), weight 85.2 kg, SpO2 95 %.   General appearance: appears stated age Resp: clear to auscultation bilaterally Cardio: regular rate and rhythm, S1, S2 normal, no murmur, click, rub or gallop GI: soft, non-tender; bowel sounds normal; no masses,  no organomegaly Extremities: extremities normal, atraumatic, no cyanosis or edema Pulses: 2+ and symmetric Skin: Skin color, texture, turgor normal. No rashes or lesions Neurologic: Alert and oriented X 3, normal strength and tone. Normal symmetric reflexes. Normal coordination and gait Labs:   Lab Results  Component Value Date   WBC 8.4 02/21/2021   HGB 12.7 02/21/2021   HCT 37.2 02/21/2021   MCV 90.7 02/21/2021   PLT 216 02/21/2021   No results for input(s): NA, K, CL, CO2, BUN, CREATININE, CALCIUM, PROT, BILITOT, ALKPHOS, ALT, AST, GLUCOSE in the last 168 hours.  Invalid input(s): LABALBU    Radiology: None EKG: Normal sinus  rhythm nonspecific T2 changes  FOLLOW UP PLANS AND APPOINTMENTS Discharge Instructions    AMB Referral to Cardiac Rehabilitation - Phase II   Complete by: As directed    Diagnosis: Coronary Stents   After initial evaluation and assessments completed: Virtual Based Care may be provided alone or in conjunction with Phase 2 Cardiac Rehab based on patient barriers.: Yes     Allergies as of 02/21/2021      Reactions   Penicillins Hives   Has patient had a PCN reaction causing immediate rash, facial/tongue/throat swelling, SOB or lightheadedness with hypotension: No Has patient had a PCN reaction causing severe rash involving mucus membranes or skin necrosis: Yes Has patient had a PCN reaction that required hospitalization: No Has patient had a PCN reaction occurring within the last 10 years: Yes If all of the above answers are "NO", then may proceed with Cephalosporin use.   Statins    Other reaction(s): Muscle Pain      Medication List    TAKE these medications   aspirin 81 MG tablet Take 81 mg by mouth daily.   clopidogrel 75 MG tablet Commonly known as: PLAVIX TAKE ONE TABLET BY MOUTH DAILY   D-MANNOSE PO Take 800 mg by mouth daily.   ELDERBERRY PO Take 1,000 mg by mouth daily.   freestyle lancets uSE TO CHECK SUGARS ONCE DAILY e11.9   furosemide 40 MG tablet Commonly known as: LASIX Take 40 mg by mouth daily.   GLUCOSAMINE-CHONDROITIN PO Take 2 tablets by mouth daily.   isosorbide mononitrate 120 MG 24 hr tablet Commonly known as: IMDUR Take 120 mg by mouth daily.   loratadine 10 MG  tablet Commonly known as: CLARITIN Take 10 mg by mouth daily as needed for allergies.   metoprolol 200 MG 24 hr tablet Commonly known as: TOPROL-XL TAKE 1 TABLET BY MOUTH DAILY. What changed:   how much to take  how to take this  when to take this   nitroGLYCERIN 0.4 MG SL tablet Commonly known as: NITROSTAT Place 0.4 mg under the tongue every 5 (five) minutes as needed  for chest pain.   omeprazole 20 MG capsule Commonly known as: PRILOSEC Take 1 capsule (20 mg total) by mouth daily. What changed:   how much to take  how to take this  when to take this  reasons to take this  additional instructions   ONE TOUCH ULTRA TEST test strip Generic drug: glucose blood CHECK DAILY   OVER THE COUNTER MEDICATION Take 50 mg by mouth daily. Blood sugar support with Pyerogel   Praluent 75 MG/ML Soaj Generic drug: Alirocumab Inject 75 mg into the muscle every 14 (fourteen) days.   ranolazine 500 MG 12 hr tablet Commonly known as: RANEXA Take 500 mg by mouth 2 (two) times daily.   sacubitril-valsartan 49-51 MG Commonly known as: ENTRESTO Take 1 tablet by mouth 2 (two) times daily.   spironolactone 25 MG tablet Commonly known as: Aldactone Take 1 tablet (25 mg total) by mouth daily.       Follow-up Information    Yolonda Kida, MD On 03/10/2021.   Specialties: Cardiology, Internal Medicine Why: Continue previous medications  @ 10am Contact information: [address] [place] [address] [address] [place] TO FOLLOW UP APPOINTMENTS  Time spent with patient to include physician time: 30 Signed:  Yolonda Kida MD 03/24/2021, 8:38 AM

## 2021-03-31 ENCOUNTER — Other Ambulatory Visit: Payer: Self-pay

## 2021-03-31 ENCOUNTER — Encounter: Payer: Self-pay | Admitting: Internal Medicine

## 2021-03-31 ENCOUNTER — Ambulatory Visit (INDEPENDENT_AMBULATORY_CARE_PROVIDER_SITE_OTHER): Payer: Medicare Other | Admitting: Internal Medicine

## 2021-03-31 VITALS — BP 110/82 | HR 87 | Temp 98.6°F | Ht 64.0 in | Wt 187.8 lb

## 2021-03-31 DIAGNOSIS — M542 Cervicalgia: Secondary | ICD-10-CM

## 2021-03-31 DIAGNOSIS — I25118 Atherosclerotic heart disease of native coronary artery with other forms of angina pectoris: Secondary | ICD-10-CM | POA: Diagnosis not present

## 2021-03-31 DIAGNOSIS — J986 Disorders of diaphragm: Secondary | ICD-10-CM | POA: Diagnosis not present

## 2021-03-31 DIAGNOSIS — G8929 Other chronic pain: Secondary | ICD-10-CM | POA: Diagnosis not present

## 2021-03-31 DIAGNOSIS — T466X5A Adverse effect of antihyperlipidemic and antiarteriosclerotic drugs, initial encounter: Secondary | ICD-10-CM | POA: Diagnosis not present

## 2021-03-31 DIAGNOSIS — E1169 Type 2 diabetes mellitus with other specified complication: Secondary | ICD-10-CM | POA: Diagnosis not present

## 2021-03-31 DIAGNOSIS — M545 Low back pain, unspecified: Secondary | ICD-10-CM

## 2021-03-31 DIAGNOSIS — I729 Aneurysm of unspecified site: Secondary | ICD-10-CM

## 2021-03-31 DIAGNOSIS — Z289 Immunization not carried out for unspecified reason: Secondary | ICD-10-CM

## 2021-03-31 DIAGNOSIS — E785 Hyperlipidemia, unspecified: Secondary | ICD-10-CM

## 2021-03-31 DIAGNOSIS — Z955 Presence of coronary angioplasty implant and graft: Secondary | ICD-10-CM | POA: Diagnosis not present

## 2021-03-31 DIAGNOSIS — M791 Myalgia, unspecified site: Secondary | ICD-10-CM | POA: Diagnosis not present

## 2021-03-31 MED ORDER — TRAMADOL HCL 50 MG PO TABS: 50.0000 mg | ORAL_TABLET | Freq: Four times a day (QID) | ORAL | 0 refills | Status: AC | PRN

## 2021-03-31 MED ORDER — TETANUS-DIPHTH-ACELL PERTUSSIS 5-2.5-18.5 LF-MCG/0.5 IM SUSY: 0.5000 mL | PREFILLED_SYRINGE | Freq: Once | INTRAMUSCULAR | 0 refills | Status: AC

## 2021-03-31 NOTE — Patient Instructions (Signed)
Good to see you!   Trial of tramadol for your insomnia (to treat nighttime pain)    [employer] low carb power bowl entrees  are great low carb entrees that microwave in 5 minutes  And are < 250 calories

## 2021-03-31 NOTE — Progress Notes (Signed)
Subjective:  Patient ID: Stacie Mcdonald, female    DOB: [birth year]  Age: 75 y.o. MRN: [id number]  CC: The primary encounter diagnosis was Type 2 diabetes mellitus with hyperlipidemia ([place]). 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  HPI Stacie Mcdonald presents for follow up on multiple chronic issues   This visit occurred during the SARS-CoV-2 public health emergency.  Safety protocols were in place, including screening questions prior to the visit, additional usage of staff PPE, and extensive cleaning of exam room while observing appropriate contact time as indicated for disinfecting solutions.   Since her last visit she has undergone a diagnostic  cardiac catheteriization for evaluation of recurrent angina symptoms and is now s/p PTCA stent x 2  of RCA last month. Her anginal symptoms which were presenting as Neck pain have  resolved.  Still reports SOB with exertion, but has returned to  Regularly scheduled group exercise program at a local gym .   Insomnia:  She is having Frequent wakenings .  Wearing CPAP .  Has chronic back pain that wakes her . Already taking 2000 mg tylenol daily. No nocturia.    Outpatient Medications Prior to Visit  Medication Sig Dispense Refill  . aspirin 81 MG tablet Take 81 mg by mouth daily.    . clopidogrel (PLAVIX) 75 MG tablet TAKE ONE TABLET BY MOUTH DAILY (Patient taking differently: Take 75 mg by mouth daily.) 90 tablet 1  . D-MANNOSE PO Take 800 mg by mouth daily.    Marland Kitchen ELDERBERRY PO Take 1,000 mg by mouth daily.    . furosemide (LASIX) 40 MG tablet Take 40 mg by  mouth daily.    Marland Kitchen GLUCOSAMINE-CHONDROITIN PO Take 2 tablets by mouth daily.    . isosorbide mononitrate (IMDUR) 120 MG 24 hr tablet Take 120 mg by mouth daily.     . Lancets (FREESTYLE) lancets uSE TO CHECK SUGARS ONCE DAILY e11.9 100 each 3  . loratadine (CLARITIN) 10 MG tablet Take 10 mg by mouth daily as needed for allergies.    . metoprolol (TOPROL-XL) 200 MG 24 hr tablet TAKE 1 TABLET BY MOUTH DAILY. (Patient taking differently: Take 200 mg by mouth daily. TAKE 1 TABLET BY MOUTH DAILY.) 90 tablet 1  . omeprazole (PRILOSEC) 20 MG capsule Take 1 capsule (20 mg total) by mouth daily. (Patient taking differently: Take 20 mg by mouth daily as needed (acid reflux).) 90 capsule 1  . ONE TOUCH ULTRA TEST test strip CHECK DAILY 100 each 2  . OVER THE COUNTER MEDICATION Take 50 mg by mouth daily. Blood sugar support with Pyerogel    . PRALUENT 75 MG/ML SOAJ Inject 75 mg into the muscle every 14 (fourteen) days.    . ranolazine (RANEXA) 500 MG 12 hr tablet Take 500 mg by mouth 2 (two) times daily.    . sacubitril-valsartan (ENTRESTO) 49-51 MG Take 1 tablet by mouth 2 (two) times daily.    Marland Kitchen spironolactone (ALDACTONE) 25 MG tablet Take 1 tablet (25 mg total) by mouth daily. 90 tablet 0  . nitroGLYCERIN (NITROSTAT) 0.4 MG SL tablet Place 0.4 mg under the tongue every 5 (five) minutes as needed  for chest pain. (Patient not taking: Reported on 03/31/2021)     No facility-administered medications prior to visit.    Review of Systems;  Patient denies headache, fevers, malaise, unintentional weight loss, skin rash, eye pain, sinus congestion and sinus pain, sore throat, dysphagia,  hemoptysis , cough, dyspnea, wheezing, chest pain, palpitations, orthopnea, edema, abdominal pain, nausea, melena, diarrhea, constipation, flank pain, dysuria, hematuria, urinary  Frequency, nocturia, numbness, tingling, seizures,  Focal weakness, Loss of consciousness,  Tremor, insomnia, depression, anxiety, and suicidal ideation.       Objective:  BP 110/82 (BP Location: Left Arm, Patient Position: Sitting, Cuff Size: Large)   Pulse 87   Temp 98.6 F (37 C) (Oral)   Ht $R'5\' 4"'Es$  (1.626 m)   Wt 187 lb 12.8 oz (85.2 kg)   SpO2 95%   BMI 32.24 kg/m   BP Readings from Last 3 Encounters:  03/31/21 110/82  03/03/21 (!) 150/85  02/27/21 (!) 142/68    Wt Readings from Last 3 Encounters:  03/31/21 187 lb 12.8 oz (85.2 kg)  03/03/21 190 lb (86.2 kg)  02/27/21 185 lb (83.9 kg)    General appearance: alert, cooperative and appears stated age Ears: normal TM's and external ear canals both ears Throat: lips, mucosa, and tongue normal; teeth and gums normal Neck: no adenopathy, no carotid bruit, supple, symmetrical, trachea midline and thyroid not enlarged, symmetric, no tenderness/mass/nodules Back: symmetric, no curvature. ROM normal. No CVA tenderness. Lungs: clear to auscultation bilaterally Heart: regular rate and rhythm, S1, S2 normal, no murmur, click, rub or gallop Abdomen: soft, non-tender; bowel sounds normal; no masses,  no organomegaly Pulses: 2+ and symmetric Skin: Skin color, texture, turgor normal. No rashes or lesions Lymph nodes: Cervical, supraclavicular, and axillary nodes normal.  Lab Results  Component Value Date   HGBA1C 6.3 03/31/2021   HGBA1C 6.4 08/02/2019   HGBA1C 6.6 (H) 07/27/2018    Lab Results  Component Value Date   CREATININE 0.88 03/31/2021   CREATININE 0.81 02/21/2021   CREATININE 0.90 08/02/2019    Lab Results  Component Value Date   WBC 8.4 02/21/2021   HGB 12.7 02/21/2021   HCT 37.2 02/21/2021   PLT 216 02/21/2021   GLUCOSE 102 (H) 03/31/2021   CHOL 111 03/31/2021   TRIG 109.0 03/31/2021   HDL 45.50 03/31/2021   LDLDIRECT 123.0 01/20/2018   LDLCALC 44 03/31/2021   ALT 19 03/31/2021   AST 17 03/31/2021   NA 141 03/31/2021   K 4.0 03/31/2021   CL 101 03/31/2021   CREATININE 0.88 03/31/2021   BUN 19 03/31/2021   CO2 30 03/31/2021   TSH 2.03 08/02/2019    INR 1.0 09/27/2012   HGBA1C 6.3 03/31/2021   MICROALBUR <0.7 08/02/2019    [place] Intraoperative  Result Date: 02/27/2021 CLINICAL DATA:  Ultrasound was provided for use by the ordering physician.  No provider Interpretation or professional fees incurred.    CARDIAC CATHETERIZATION  Result Date: 02/27/2021 See op note  [place] Lower Ext Art Right Ltd  Result Date: 02/27/2021 CLINICAL DATA:  Right groin bleeding, cardiac catheterization week ago EXAM: RIGHT LOWER EXTREMITY ARTERIAL DUPLEX SCAN TECHNIQUE: Gray-scale sonography as well as color Doppler and duplex ultrasound was performed to evaluate the right common femoral vasculature for pseudoaneurysm evaluation. COMPARISON:  None. FINDINGS: Right lower Extremity Anterior to the right common femoral artery, there is a complex mixed echogenicity fluid collection measuring 4.1 x 3.0 x 3.3 cm. With color and Doppler evaluation there is pulsatile flow and  a small vascular neck leading to the abnormality. There is thrombus within the central aspect of the collection. Findings are compatible with a femoral pseudoaneurysm. Femoral artery and vein remain patent. 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 Electronically Signed   By: Jerilynn Mages.  Shick M.D.   On: 02/27/2021 16:49    Assessment & Plan:   Problem List Items Addressed This Visit      Unprioritized   CAD (coronary artery disease)   Chronic left-sided low back pain without sciatica    She is woken up at night with uncontrolled pain. She has maximized her tylenol use.  Trial of bedtime tramadol.       Relevant Medications   traMADol (ULTRAM) 50 MG tablet   Chronic neck pain    Now resolved s/p PTC/stent of RCA       Relevant Medications   traMADol (ULTRAM) 50 MG tablet   RESOLVED: Diabetes mellitus without complication ([place]) - Primary     Despite being lost to follow up and admittedly indulging in "sweets,"  She remains well-controlled on diet alone. . Patient is not up-to-date  on eye exams and foot exam is normal today. Patient is overdue for urine microalbumin to creatinine ratio, but is taking an ARB (valsartan) . Patient is intolerant of  statin therapy for CAD risk reduction but it tolerating a PCSK9 (Praluent) .     Lab Results  Component Value Date   HGBA1C 6.3 03/31/2021   Lab Results  Component Value Date   CHOL 111 03/31/2021   HDL 45.50 03/31/2021   LDLCALC 44 03/31/2021   LDLDIRECT 123.0 01/20/2018   TRIG 109.0 03/31/2021   CHOLHDL 2 03/31/2021   Lab Results  Component Value Date   MICROALBUR <0.7 08/02/2019          Disorder of diaphragm    Cited as the reason for her exertional dyspnea.       Hyperlipidemia associated with type 2 diabetes mellitus ([place])     Despite being lost to follow up and admittedly indulging in "sweets,"  She remains well-controlled on diet alone. . Patient is not up-to-date on eye exams and foot exam is normal today. Patient is overdue for urine microalbumin to creatinine ratio, but is taking an ARB (valsartan) . Patient is intolerant of  statin therapy for CAD risk reduction but it tolerating a PCSK9 (Praluent).  Will discuss use of metformin and Jardiance at next visit if weight loss has not been achieved and glycemic control has been lost.    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  Tolerating PCSK9 inhibitor with excellent results       Pseudoaneurysm ([place])    Of right groin secondary to cardiac catheterization via femoral artery.  Managed by Dr Delana Meyer with ablation.  By follow up appt :  Duplex ultrasound shows the pseudoaneurysm has been well treated;  there  is a small hematoma which is half the size of the pretreatment measurements.  No further procedures or surgeries indicated.  S/P drug eluting coronary stent placement    PTCA/stent x 2 of RCA in April 2022        Other Visit Diagnoses    COVID-19 vaccination not done       Relevant Orders   SARS-CoV-2 Semi-Quantitative Total Antibody, Spike      I am having Caddie T. Mcdonald start on Tdap and traMADol. I am also having her maintain her aspirin, clopidogrel, ONE TOUCH ULTRA TEST, metoprolol, omeprazole, freestyle, isosorbide mononitrate, GLUCOSAMINE-CHONDROITIN PO, loratadine, Praluent, spironolactone, furosemide, ranolazine, nitroGLYCERIN, sacubitril-valsartan, D-MANNOSE PO, OVER THE COUNTER MEDICATION, and ELDERBERRY PO.  Meds ordered this encounter  Medications  . Tdap (BOOSTRIX) 5-2.5-18.5 LF-MCG/0.5 injection    Sig: Inject 0.5 mLs into the muscle once for 1 dose.    Dispense:  0.5 mL    Refill:  0  . traMADol (ULTRAM) 50 MG tablet    Sig: Take 1 tablet (50 mg total) by mouth every 6 (six) hours as needed for up to 7 days.    Dispense:  30 tablet    Refill:  0    There are no discontinued medications.  Follow-up: Return in about 6 months (around 10/01/2021) for follow up diabetes.   Crecencio Mc, MD

## 2021-04-01 ENCOUNTER — Encounter: Payer: Self-pay | Admitting: Internal Medicine

## 2021-04-01 LAB — LIPID PANEL
Cholesterol: 111 mg/dL (ref 0–200)
HDL: 45.5 mg/dL (ref >39.00)
LDL Cholesterol: 44 mg/dL (ref 0–99)
NonHDL: 65.79
Total CHOL/HDL Ratio: 2
Triglycerides: 109 mg/dL (ref 0.0–149.0)
VLDL: 21.8 mg/dL (ref 0.0–40.0)

## 2021-04-01 LAB — COMPREHENSIVE METABOLIC PANEL
ALT: 19 U/L (ref 0–35)
AST: 17 U/L (ref 0–37)
Albumin: 4.6 g/dL (ref 3.5–5.2)
Alkaline Phosphatase: 39 U/L (ref 39–117)
BUN: 19 mg/dL (ref 6–23)
CO2: 30 mEq/L (ref 19–32)
Calcium: 9.6 mg/dL (ref 8.4–10.5)
Chloride: 101 mEq/L (ref 96–112)
Creatinine, Ser: 0.88 mg/dL (ref 0.40–1.20)
GFR: 63.92 mL/min (ref >60.00)
Glucose, Bld: 102 mg/dL — ABNORMAL HIGH (ref 70–99)
Potassium: 4 mEq/L (ref 3.5–5.1)
Sodium: 141 mEq/L (ref 135–145)
Total Bilirubin: 0.5 mg/dL (ref 0.2–1.2)
Total Protein: 7 g/dL (ref 6.0–8.3)

## 2021-04-01 LAB — HEMOGLOBIN A1C: Hgb A1c MFr Bld: 6.3 % (ref 4.6–6.5)

## 2021-04-01 NOTE — Assessment & Plan Note (Signed)
PTCA/stent x 2 of RCA in April 2022

## 2021-04-01 NOTE — Assessment & Plan Note (Signed)
She is woken up at night with uncontrolled pain. She has maximized her tylenol use.  Trial of bedtime tramadol.

## 2021-04-01 NOTE — Assessment & Plan Note (Signed)
Cited as the reason for her exertional dyspnea.

## 2021-04-01 NOTE — Assessment & Plan Note (Signed)
Despite being lost to follow up and admittedly indulging in "sweets,"  She remains well-controlled on diet alone. . Patient is not up-to-date on eye exams and foot exam is normal today. Patient is overdue for urine microalbumin to creatinine ratio, but is taking an ARB (valsartan) . Patient is intolerant of  statin therapy for CAD risk reduction but it tolerating a PCSK9 (Praluent) .     Lab Results  Component Value Date   HGBA1C 6.3 03/31/2021   Lab Results  Component Value Date   CHOL 111 03/31/2021   HDL 45.50 03/31/2021   LDLCALC 44 03/31/2021   LDLDIRECT 123.0 01/20/2018   TRIG 109.0 03/31/2021   CHOLHDL 2 03/31/2021   Lab Results  Component Value Date   MICROALBUR <0.7 08/02/2019

## 2021-04-01 NOTE — Assessment & Plan Note (Addendum)
Of right groin secondary to cardiac catheterization via femoral artery.  Managed by Dr Delana Meyer with ablation.  By follow up appt :  Duplex ultrasound shows the pseudoaneurysm has been well treated;  there is a small hematoma which is half the size of the pretreatment measurements.  No further procedures or surgeries indicated.

## 2021-04-01 NOTE — Assessment & Plan Note (Signed)
Now resolved s/p PTC/stent of RCA

## 2021-04-01 NOTE — Assessment & Plan Note (Signed)
With recurrent trials.  Tolerating PCSK9 inhibitor with excellent results

## 2021-04-01 NOTE — Assessment & Plan Note (Addendum)
Despite being lost to follow up and admittedly indulging in "sweets,"  She remains well-controlled on diet alone. . Patient is not up-to-date on eye exams and foot exam is normal today. Patient is overdue for urine microalbumin to creatinine ratio, but is taking an ARB (valsartan) . Patient is intolerant of  statin therapy for CAD risk reduction but it tolerating a PCSK9 (Praluent).  Will discuss use of metformin and Jardiance at next visit if weight loss has not been achieved and glycemic control has been lost.    Lab Results  Component Value Date   ALT 19 03/31/2021   AST 17 03/31/2021   ALKPHOS 39 03/31/2021   BILITOT 0.5 03/31/2021   Lab Results  Component Value Date   CHOL 111 03/31/2021   HDL 45.50 03/31/2021   LDLCALC 44 03/31/2021   LDLDIRECT 123.0 01/20/2018   TRIG 109.0 03/31/2021   CHOLHDL 2 03/31/2021

## 2021-04-04 LAB — SARS-COV-2 SEMI-QUANTITATIVE TOTAL ANTIBODY, SPIKE: SARS COV2 AB, Total Spike Semi QN: >2500 U/mL — ABNORMAL HIGH (ref <0.8)

## 2021-04-29 DIAGNOSIS — Z85828 Personal history of other malignant neoplasm of skin: Secondary | ICD-10-CM | POA: Diagnosis not present

## 2021-04-29 DIAGNOSIS — R208 Other disturbances of skin sensation: Secondary | ICD-10-CM | POA: Diagnosis not present

## 2021-04-29 DIAGNOSIS — D2272 Melanocytic nevi of left lower limb, including hip: Secondary | ICD-10-CM | POA: Diagnosis not present

## 2021-04-29 DIAGNOSIS — L84 Corns and callosities: Secondary | ICD-10-CM | POA: Diagnosis not present

## 2021-04-29 DIAGNOSIS — D2261 Melanocytic nevi of right upper limb, including shoulder: Secondary | ICD-10-CM | POA: Diagnosis not present

## 2021-04-29 DIAGNOSIS — B353 Tinea pedis: Secondary | ICD-10-CM | POA: Diagnosis not present

## 2021-04-29 DIAGNOSIS — L304 Erythema intertrigo: Secondary | ICD-10-CM | POA: Diagnosis not present

## 2021-04-29 DIAGNOSIS — L820 Inflamed seborrheic keratosis: Secondary | ICD-10-CM | POA: Diagnosis not present

## 2021-04-29 DIAGNOSIS — D225 Melanocytic nevi of trunk: Secondary | ICD-10-CM | POA: Diagnosis not present

## 2021-04-29 DIAGNOSIS — L538 Other specified erythematous conditions: Secondary | ICD-10-CM | POA: Diagnosis not present

## 2021-06-11 DIAGNOSIS — I5042 Chronic combined systolic (congestive) and diastolic (congestive) heart failure: Secondary | ICD-10-CM | POA: Diagnosis not present

## 2021-06-11 DIAGNOSIS — R000 Tachycardia, unspecified: Secondary | ICD-10-CM | POA: Diagnosis not present

## 2021-06-11 DIAGNOSIS — I208 Other forms of angina pectoris: Secondary | ICD-10-CM | POA: Diagnosis not present

## 2021-06-11 DIAGNOSIS — E782 Mixed hyperlipidemia: Secondary | ICD-10-CM | POA: Diagnosis not present

## 2021-06-11 DIAGNOSIS — I2510 Atherosclerotic heart disease of native coronary artery without angina pectoris: Secondary | ICD-10-CM | POA: Diagnosis not present

## 2021-06-11 DIAGNOSIS — E669 Obesity, unspecified: Secondary | ICD-10-CM | POA: Diagnosis not present

## 2021-06-11 DIAGNOSIS — R0609 Other forms of dyspnea: Secondary | ICD-10-CM | POA: Diagnosis not present

## 2021-06-11 DIAGNOSIS — I10 Essential (primary) hypertension: Secondary | ICD-10-CM | POA: Diagnosis not present

## 2021-06-11 DIAGNOSIS — Z955 Presence of coronary angioplasty implant and graft: Secondary | ICD-10-CM | POA: Diagnosis not present

## 2021-07-31 ENCOUNTER — Ambulatory Visit: Payer: Medicare Other

## 2021-08-25 DIAGNOSIS — Z23 Encounter for immunization: Secondary | ICD-10-CM | POA: Diagnosis not present

## 2021-10-02 ENCOUNTER — Ambulatory Visit: Payer: Medicare Other | Admitting: Internal Medicine

## 2021-10-13 ENCOUNTER — Telehealth: Payer: Self-pay | Admitting: Internal Medicine

## 2021-10-13 ENCOUNTER — Other Ambulatory Visit: Payer: Self-pay | Admitting: Internal Medicine

## 2021-10-13 DIAGNOSIS — Z1231 Encounter for screening mammogram for malignant neoplasm of breast: Secondary | ICD-10-CM

## 2021-10-13 NOTE — Telephone Encounter (Signed)
Pt is aware and will call [facility] to get it scheduled.

## 2021-10-13 NOTE — Telephone Encounter (Signed)
Patient called and is requesting a referral for a mammogram.

## 2021-10-14 DIAGNOSIS — J986 Disorders of diaphragm: Secondary | ICD-10-CM | POA: Diagnosis not present

## 2021-10-14 DIAGNOSIS — G4733 Obstructive sleep apnea (adult) (pediatric): Secondary | ICD-10-CM | POA: Diagnosis not present

## 2021-10-14 DIAGNOSIS — E663 Overweight: Secondary | ICD-10-CM | POA: Diagnosis not present

## 2021-10-14 DIAGNOSIS — R0609 Other forms of dyspnea: Secondary | ICD-10-CM | POA: Diagnosis not present

## 2021-10-14 DIAGNOSIS — Z9989 Dependence on other enabling machines and devices: Secondary | ICD-10-CM | POA: Diagnosis not present

## 2021-10-17 DIAGNOSIS — Z01818 Encounter for other preprocedural examination: Secondary | ICD-10-CM | POA: Diagnosis not present

## 2021-10-17 DIAGNOSIS — R0609 Other forms of dyspnea: Secondary | ICD-10-CM | POA: Diagnosis not present

## 2021-10-29 ENCOUNTER — Ambulatory Visit
Admission: RE | Admit: 2021-10-29 | Discharge: 2021-10-29 | Disposition: A | Discharge status: Home / Self Care | Payer: Medicare Other | Source: Ambulatory Visit | Attending: Internal Medicine | Admitting: Internal Medicine

## 2021-10-29 ENCOUNTER — Other Ambulatory Visit: Payer: Self-pay

## 2021-10-29 DIAGNOSIS — Z1231 Encounter for screening mammogram for malignant neoplasm of breast: Secondary | ICD-10-CM | POA: Diagnosis not present

## 2021-11-04 ENCOUNTER — Other Ambulatory Visit: Payer: Self-pay

## 2021-11-04 ENCOUNTER — Encounter: Payer: Self-pay | Admitting: Internal Medicine

## 2021-11-04 ENCOUNTER — Ambulatory Visit (INDEPENDENT_AMBULATORY_CARE_PROVIDER_SITE_OTHER): Payer: Medicare Other | Admitting: Internal Medicine

## 2021-11-04 VITALS — BP 112/78 | HR 94 | Temp 98.5°F | Ht 64.0 in | Wt 186.0 lb

## 2021-11-04 DIAGNOSIS — R5383 Other fatigue: Secondary | ICD-10-CM | POA: Diagnosis not present

## 2021-11-04 DIAGNOSIS — I25118 Atherosclerotic heart disease of native coronary artery with other forms of angina pectoris: Secondary | ICD-10-CM | POA: Diagnosis not present

## 2021-11-04 DIAGNOSIS — E785 Hyperlipidemia, unspecified: Secondary | ICD-10-CM | POA: Diagnosis not present

## 2021-11-04 DIAGNOSIS — E1169 Type 2 diabetes mellitus with other specified complication: Secondary | ICD-10-CM

## 2021-11-04 DIAGNOSIS — J984 Other disorders of lung: Secondary | ICD-10-CM

## 2021-11-04 DIAGNOSIS — F5102 Adjustment insomnia: Secondary | ICD-10-CM

## 2021-11-04 MED ORDER — TRAZODONE HCL 50 MG PO TABS: 25.0000 mg | ORAL_TABLET | Freq: Every evening | ORAL | 1 refills | Status: DC | PRN

## 2021-11-04 MED ORDER — TETANUS-DIPHTH-ACELL PERTUSSIS 5-2.5-18.5 LF-MCG/0.5 IM SUSY: 0.5000 mL | PREFILLED_SYRINGE | Freq: Once | INTRAMUSCULAR | 0 refills | Status: AC

## 2021-11-04 NOTE — Progress Notes (Signed)
Subjective:  Patient ID: Stacie Mcdonald, female    DOB: 22-Jun-1946  Age: 75 y.o. MRN: [id number]  CC: The primary encounter diagnosis was Hyperlipidemia associated with type 2 diabetes mellitus ([place]). Diagnoses of Other fatigue, Insomnia due to stress, Lung disease, restrictive, and Hyperlipidemia with target low density lipoprotein (LDL) cholesterol less than 70 mg/dL were also pertinent to this visit.  HPI Stacie Mcdonald presents for follow up on type 2 DM with hyperlipidemia and hypertension   This visit occurred during the SARS-CoV-2 public health emergency.  Safety protocols were in place, including screening questions prior to the visit, additional usage of staff PPE, and extensive cleaning of exam room while observing appropriate contact time as indicated for disinfecting solutions.     T2DM:  She  feels generally well,  and is  exercising regularly . Checking  blood sugars less than once daily at variable times, usually only if she feels she may be having a hypoglycemic event. .  BS have been under 130 fasting and < 150 post prandially.  Denies any recent hypoglyemic events.  Taking   all medications as directed. Following a carbohydrate modified diet 6 days per week. Denies numbness, burning and tingling of extremities. Appetite is good.    Saw [facility] last week for exertional dyspnea .  PFTS  were done  and suggested a mixed  obstructive/restrictive pattern picture.  She is symptomatic with walking  but not  with the exercises she does at the gym doing arm lifts  etc  in a group class.  She has not tried walking on the treadmill.  She is not interested in enrolling in the cardiopulmonary rehab program at this time.   Insomnia:  Patient  has been having some trouble sleeping.  Wakes up 2 to 3 times per night . Bedtime hygiene reviewed,  Patient has not been using an electronic book to read before bed.  Does not drink caffeinated beverages after 3 PM.  Only voids  bladder once per night.   No snoring partner. Does not drink alcohol to excess.  Not exercising excessively in the evening. Patient does have a history of anxiety but does not lie awake worrying about issues that cannot be resolved. Does not take stimulants. .   Outpatient Medications Prior to Visit  Medication Sig Dispense Refill   aspirin 81 MG tablet Take 81 mg by mouth daily.     clopidogrel (PLAVIX) 75 MG tablet TAKE ONE TABLET BY MOUTH DAILY (Patient taking differently: Take 75 mg by mouth daily.) 90 tablet 1   D-MANNOSE PO Take 800 mg by mouth daily.     ELDERBERRY PO Take 1,000 mg by mouth daily.     furosemide (LASIX) 40 MG tablet Take 40 mg by mouth daily.     GLUCOSAMINE-CHONDROITIN PO Take 2 tablets by mouth daily.     isosorbide mononitrate (IMDUR) 120 MG 24 hr tablet Take 120 mg by mouth daily.      Lancets (FREESTYLE) lancets uSE TO CHECK SUGARS ONCE DAILY e11.9 100 each 3   loratadine (CLARITIN) 10 MG tablet Take 10 mg by mouth daily as needed for allergies.     metoprolol (TOPROL-XL) 200 MG 24 hr tablet TAKE 1 TABLET BY MOUTH DAILY. (Patient taking differently: Take 200 mg by mouth daily. TAKE 1 TABLET BY MOUTH DAILY.) 90 tablet 1   nitroGLYCERIN (NITROSTAT) 0.4 MG SL tablet Place 0.4 mg under the tongue every 5 (five) minutes as needed for chest pain.  omeprazole (PRILOSEC) 20 MG capsule Take 1 capsule (20 mg total) by mouth daily. (Patient taking differently: Take 20 mg by mouth daily as needed (acid reflux).) 90 capsule 1   ONE TOUCH ULTRA TEST test strip CHECK DAILY 100 each 2   OVER THE COUNTER MEDICATION Take 50 mg by mouth daily. Blood sugar support with Pyerogel     PRALUENT 75 MG/ML SOAJ Inject 75 mg into the muscle every 14 (fourteen) days.     ranolazine (RANEXA) 500 MG 12 hr tablet Take 500 mg by mouth 2 (two) times daily.     sacubitril-valsartan (ENTRESTO) 49-51 MG Take 1 tablet by mouth 2 (two) times daily.     spironolactone (ALDACTONE) 25 MG tablet Take 1 tablet (25 mg total) by mouth  daily. 90 tablet 0   No facility-administered medications prior to visit.    Review of Systems;  Patient denies headache, fevers, malaise, unintentional weight loss, skin rash, eye pain, sinus congestion and sinus pain, sore throat, dysphagia,  hemoptysis , cough,  wheezing, chest pain, palpitations, orthopnea, edema, abdominal pain, nausea, melena, diarrhea, constipation, flank pain, dysuria, hematuria, urinary  Frequency, nocturia, numbness, tingling, seizures,  Focal weakness, Loss of consciousness,  Tremor,  depression, anxiety, and suicidal ideation.      Objective:  BP 112/78 (BP Location: Left Arm, Patient Position: Sitting, Cuff Size: Large)    Pulse 94    Temp 98.5 F (36.9 C) (Oral)    Ht 5\' 4"  (1.626 m)    Wt 186 lb (84.4 kg)    SpO2 95%    BMI 31.93 kg/m   BP Readings from Last 3 Encounters:  11/04/21 112/78  03/31/21 110/82  03/03/21 (!) 150/85    Wt Readings from Last 3 Encounters:  11/04/21 186 lb (84.4 kg)  03/31/21 187 lb 12.8 oz (85.2 kg)  03/03/21 190 lb (86.2 kg)    General appearance: alert, cooperative and appears stated age Ears: normal TM's and external ear canals both ears Throat: lips, mucosa, and tongue normal; teeth and gums normal Neck: no adenopathy, no carotid bruit, supple, symmetrical, trachea midline and thyroid not enlarged, symmetric, no tenderness/mass/nodules Back: symmetric, no curvature. ROM normal. No CVA tenderness. Lungs: clear to auscultation bilaterally Heart: regular rate and rhythm, S1, S2 normal, no murmur, click, rub or gallop Abdomen: soft, non-tender; bowel sounds normal; no masses,  no organomegaly Pulses: 2+ and symmetric Skin: Skin color, texture, turgor normal. No rashes or lesions Lymph nodes: Cervical, supraclavicular, and axillary nodes normal.  Lab Results  Component Value Date   HGBA1C 6.5 11/04/2021   HGBA1C 6.3 03/31/2021   HGBA1C 6.4 08/02/2019    Lab Results  Component Value Date   CREATININE 0.89  11/04/2021   CREATININE 0.88 03/31/2021   CREATININE 0.81 02/21/2021    Lab Results  Component Value Date   WBC 7.3 11/04/2021   HGB 13.6 11/04/2021   HCT 41.3 11/04/2021   PLT 254.0 11/04/2021   GLUCOSE 89 11/04/2021   CHOL 113 11/04/2021   TRIG 158.0 (H) 11/04/2021   HDL 43.20 11/04/2021   LDLDIRECT 123.0 01/20/2018   LDLCALC 38 11/04/2021   ALT 19 11/04/2021   AST 17 11/04/2021   NA 138 11/04/2021   K 4.1 11/04/2021   CL 99 11/04/2021   CREATININE 0.89 11/04/2021   BUN 24 (H) 11/04/2021   CO2 27 11/04/2021   TSH 3.01 11/04/2021   INR 1.0 09/27/2012   HGBA1C 6.5 11/04/2021   MICROALBUR <0.7 08/02/2019  MM 3D SCREEN BREAST UNI LEFT  Result Date: 10/29/2021 CLINICAL DATA:  Screening. EXAM: DIGITAL SCREENING UNILATERAL LEFT MAMMOGRAM WITH CAD AND TOMOSYNTHESIS TECHNIQUE: Left screening digital craniocaudal and mediolateral oblique mammograms were obtained. Left screening digital breast tomosynthesis was performed. The images were evaluated with computer-aided detection. COMPARISON:  Previous exam(s). ACR Breast Density Category c: The breast tissue is heterogeneously dense, which may obscure small masses. FINDINGS: The patient has had a right mastectomy. There are no findings suspicious for malignancy. IMPRESSION: No mammographic evidence of malignancy. A result letter of this screening mammogram will be mailed directly to the patient. RECOMMENDATION: Screening mammogram in one year.  (Code:SM-L-58M) BI-RADS CATEGORY  1: Negative. Electronically Signed   By: Nolon Nations M.D.   On: 10/29/2021 13:00   Assessment & Plan:   Problem List Items Addressed This Visit     Hyperlipidemia with target low density lipoprotein (LDL) cholesterol less than 70 mg/dL     history of statin myalgias.Patient is intolerant of  statin therapy for CAD risk reduction but it tolerating a PCSK9 (Praluent).    Lab Results  Component Value Date   ALT 19 11/04/2021   AST 17 11/04/2021    ALKPHOS 53 11/04/2021   BILITOT 0.6 11/04/2021   Lab Results  Component Value Date   CHOL 113 11/04/2021   HDL 43.20 11/04/2021   LDLCALC 38 11/04/2021   LDLDIRECT 123.0 01/20/2018   TRIG 158.0 (H) 11/04/2021   CHOLHDL 3 11/04/2021         Lung disease, restrictive    Recent PFTs noted a mixed pattern..  She is dyspneic with walking but is exercising regularly in a group class for senior citizens.  Recommend using a treadmill and pulse oximetry to improve endurance and build confidence .  Referral to cardiopulmonary rehab deferred by patient       Insomnia due to stress    Trial of trazodone with gradual dose increase advised to max dose of 100 mg qhs taken 1 hour before bedtime       Hyperlipidemia associated with type 2 diabetes mellitus ([place] [employer]) - Primary      She remains well-controlled on diet alone. . Patient is not up-to-date on eye exams and foot exam is normal today. Patient  taking an ARB (valsartan) . Patient is intolerant of  statin therapy for CAD risk reduction but is tolerating a PCSK9 (Praluent).  Recommending trial of jardiance given her history of CAD  Lab Results  Component Value Date   HGBA1C 6.5 11/04/2021     Lab Results  Component Value Date   ALT 19 11/04/2021   AST 17 11/04/2021   ALKPHOS 53 11/04/2021   BILITOT 0.6 11/04/2021   Lab Results  Component Value Date   CHOL 113 11/04/2021   HDL 43.20 11/04/2021   LDLCALC 38 11/04/2021   LDLDIRECT 123.0 01/20/2018   TRIG 158.0 (H) 11/04/2021   CHOLHDL 3 11/04/2021         Relevant Orders   Hemoglobin A1c (Completed)   Lipid panel (Completed)   Comprehensive metabolic panel (Completed)   Other Visit Diagnoses     Other fatigue       Relevant Orders   CBC with Differential/Platelet (Completed)   TSH (Completed)       I am having Nupur T. Mcdonald start on traZODone and Tdap. I am also having her maintain her aspirin, clopidogrel, ONE TOUCH ULTRA TEST, metoprolol, omeprazole, freestyle,  isosorbide mononitrate, GLUCOSAMINE-CHONDROITIN PO, loratadine, Praluent, spironolactone, furosemide, ranolazine,  nitroGLYCERIN, sacubitril-valsartan, D-MANNOSE PO, OVER THE COUNTER MEDICATION, and ELDERBERRY PO.  Meds ordered this encounter  Medications   traZODone (DESYREL) 50 MG tablet    Sig: Take 0.5-1 tablets (25-50 mg total) by mouth at bedtime as needed for sleep.    Dispense:  90 tablet    Refill:  1   Tdap (BOOSTRIX) 5-2.5-18.5 LF-MCG/0.5 injection    Sig: Inject 0.5 mLs into the muscle once for 1 dose.    Dispense:  0.5 mL    Refill:  0     I provided  30 minutes of  face-to-face time during this encounter reviewing patient's current problems and past surgeries, labs and imaging studies, providing counseling on the above mentioned problems , and coordination  of care .   Follow-up: No follow-ups on file.   Crecencio Mc, MD

## 2021-11-04 NOTE — Patient Instructions (Addendum)
Trial of trazodone for insomnia  Start with 25 mg (1/2 tablet) .  Increase by 25 mg every few days to max dose of 100 mg.  Take higher dose earlier in the evening to avoid grogginess in the morning  If not tolerated  let me know   Try using the treadmill to increase your endurance.  Start at 2.0 mph    The Tdap and Shingrix vaccines will be COVERED BY MEDICARE if you get them at your pharmacy after January 1   May the [employer] and joy during this holiday season, and may the promise of His return bring you comfort and hope for the future.  Regards,   Deborra Medina, MD

## 2021-11-05 LAB — CBC WITH DIFFERENTIAL/PLATELET
Basophils Absolute: 0.1 10*3/uL (ref 0.0–0.1)
Basophils Relative: 1.3 % (ref 0.0–3.0)
Eosinophils Absolute: 0.1 10*3/uL (ref 0.0–0.7)
Eosinophils Relative: 1.6 % (ref 0.0–5.0)
HCT: 41.3 % (ref 36.0–46.0)
Hemoglobin: 13.6 g/dL (ref 12.0–15.0)
Lymphocytes Relative: 35.5 % (ref 12.0–46.0)
Lymphs Abs: 2.6 10*3/uL (ref 0.7–4.0)
MCHC: 33 g/dL (ref 30.0–36.0)
MCV: 94.1 fl (ref 78.0–100.0)
Monocytes Absolute: 0.5 10*3/uL (ref 0.1–1.0)
Monocytes Relative: 6.7 % (ref 3.0–12.0)
Neutro Abs: 4 10*3/uL (ref 1.4–7.7)
Neutrophils Relative %: 54.9 % (ref 43.0–77.0)
Platelets: 254 10*3/uL (ref 150.0–400.0)
RBC: 4.39 Mil/uL (ref 3.87–5.11)
RDW: 13.8 % (ref 11.5–15.5)
WBC: 7.3 10*3/uL (ref 4.0–10.5)

## 2021-11-05 LAB — COMPREHENSIVE METABOLIC PANEL
ALT: 19 U/L (ref 0–35)
AST: 17 U/L (ref 0–37)
Albumin: 4.7 g/dL (ref 3.5–5.2)
Alkaline Phosphatase: 53 U/L (ref 39–117)
BUN: 24 mg/dL — ABNORMAL HIGH (ref 6–23)
CO2: 27 mEq/L (ref 19–32)
Calcium: 9.9 mg/dL (ref 8.4–10.5)
Chloride: 99 mEq/L (ref 96–112)
Creatinine, Ser: 0.89 mg/dL (ref 0.40–1.20)
GFR: 62.79 mL/min (ref >60.00)
Glucose, Bld: 89 mg/dL (ref 70–99)
Potassium: 4.1 mEq/L (ref 3.5–5.1)
Sodium: 138 mEq/L (ref 135–145)
Total Bilirubin: 0.6 mg/dL (ref 0.2–1.2)
Total Protein: 7.4 g/dL (ref 6.0–8.3)

## 2021-11-05 LAB — LIPID PANEL
Cholesterol: 113 mg/dL (ref 0–200)
HDL: 43.2 mg/dL (ref >39.00)
LDL Cholesterol: 38 mg/dL (ref 0–99)
NonHDL: 69.87
Total CHOL/HDL Ratio: 3
Triglycerides: 158 mg/dL — ABNORMAL HIGH (ref 0.0–149.0)
VLDL: 31.6 mg/dL (ref 0.0–40.0)

## 2021-11-05 LAB — TSH: TSH: 3.01 u[IU]/mL (ref 0.35–5.50)

## 2021-11-05 LAB — HEMOGLOBIN A1C: Hgb A1c MFr Bld: 6.5 % (ref 4.6–6.5)

## 2021-11-06 ENCOUNTER — Encounter: Payer: Self-pay | Admitting: Internal Medicine

## 2021-11-06 NOTE — Assessment & Plan Note (Signed)
Recent PFTs noted a mixed pattern..  She is dyspneic with walking but is exercising regularly in a group class for senior citizens.  Recommend using a treadmill and pulse oximetry to improve endurance and build confidence .  Referral to cardiopulmonary rehab deferred by patient

## 2021-11-06 NOTE — Assessment & Plan Note (Signed)
Trial of trazodone with gradual dose increase advised to max dose of 100 mg qhs taken 1 hour before bedtime

## 2021-11-06 NOTE — Assessment & Plan Note (Signed)
history of statin myalgias.Patient is intolerant of  statin therapy for CAD risk reduction but it tolerating a PCSK9 (Praluent).    Lab Results  Component Value Date   ALT 19 11/04/2021   AST 17 11/04/2021   ALKPHOS 53 11/04/2021   BILITOT 0.6 11/04/2021   Lab Results  Component Value Date   CHOL 113 11/04/2021   HDL 43.20 11/04/2021   LDLCALC 38 11/04/2021   LDLDIRECT 123.0 01/20/2018   TRIG 158.0 (H) 11/04/2021   CHOLHDL 3 11/04/2021

## 2021-11-06 NOTE — Assessment & Plan Note (Addendum)
She remains well-controlled on diet alone. . Patient is not up-to-date on eye exams and foot exam is normal today. Patient  taking an ARB (valsartan) . Patient is intolerant of  statin therapy for CAD risk reduction but is tolerating a PCSK9 (Praluent).  Recommending trial of jardiance given her history of CAD  Lab Results  Component Value Date   HGBA1C 6.5 11/04/2021     Lab Results  Component Value Date   ALT 19 11/04/2021   AST 17 11/04/2021   ALKPHOS 53 11/04/2021   BILITOT 0.6 11/04/2021   Lab Results  Component Value Date   CHOL 113 11/04/2021   HDL 43.20 11/04/2021   LDLCALC 38 11/04/2021   LDLDIRECT 123.0 01/20/2018   TRIG 158.0 (H) 11/04/2021   CHOLHDL 3 11/04/2021

## 2021-11-14 ENCOUNTER — Encounter: Payer: Self-pay | Admitting: Internal Medicine

## 2021-11-14 NOTE — Telephone Encounter (Signed)
Pt called in stating that she spoke with Dr. Derrel Nip about trying a new medication (Jardiance) before christmas. Pt stated she didn't respond back to Dr. Derrel Nip message. Pt stated she had spoke with pharmacist about the medication (Jardiance). Pt stated she would like to try the medication now. Pt stated she is in a catastrophic coverage. Pt stated that she need the medication sent over by today or tomorrow morning. Pt requesting callback

## 2021-11-18 MED ORDER — EMPAGLIFLOZIN 10 MG PO TABS: 10.0000 mg | ORAL_TABLET | Freq: Every day | ORAL | 1 refills | Status: DC

## 2022-01-09 DIAGNOSIS — R000 Tachycardia, unspecified: Secondary | ICD-10-CM | POA: Diagnosis not present

## 2022-01-09 DIAGNOSIS — I5042 Chronic combined systolic (congestive) and diastolic (congestive) heart failure: Secondary | ICD-10-CM | POA: Diagnosis not present

## 2022-01-09 DIAGNOSIS — I2510 Atherosclerotic heart disease of native coronary artery without angina pectoris: Secondary | ICD-10-CM | POA: Diagnosis not present

## 2022-01-09 DIAGNOSIS — Z955 Presence of coronary angioplasty implant and graft: Secondary | ICD-10-CM | POA: Diagnosis not present

## 2022-01-13 DIAGNOSIS — J986 Disorders of diaphragm: Secondary | ICD-10-CM | POA: Diagnosis not present

## 2022-01-13 DIAGNOSIS — G4733 Obstructive sleep apnea (adult) (pediatric): Secondary | ICD-10-CM | POA: Diagnosis not present

## 2022-01-13 DIAGNOSIS — R0609 Other forms of dyspnea: Secondary | ICD-10-CM | POA: Diagnosis not present

## 2022-01-27 DIAGNOSIS — G4733 Obstructive sleep apnea (adult) (pediatric): Secondary | ICD-10-CM | POA: Diagnosis not present

## 2022-02-09 ENCOUNTER — Ambulatory Visit (INDEPENDENT_AMBULATORY_CARE_PROVIDER_SITE_OTHER): Payer: Medicare Other

## 2022-02-09 VITALS — Ht 64.0 in | Wt 186.0 lb

## 2022-02-09 DIAGNOSIS — Z0000 Encounter for general adult medical examination without abnormal findings: Secondary | ICD-10-CM | POA: Diagnosis not present

## 2022-02-09 NOTE — Patient Instructions (Addendum)
?  Ms. Martinique , ?Thank you for taking time to come for your Medicare Wellness Visit. I appreciate your ongoing commitment to your health goals. Please review the following plan we discussed and let me know if I can assist you in the future.  ? ?These are the goals we discussed: ? Goals   ? ?  ? Patient Stated  ?   Weight goal 165lb (pt-stated)   ?   Stay active ?Healthy diet ?  ?  ? Other  ?   Follow up with provider as needed   ? ?  ?  ?This is a list of the screening recommended for you and due dates:  ?Health Maintenance  ?Topic Date Due  ? Complete foot exam   02/14/2022*  ? Tetanus Vaccine  02/10/2023*  ? Hemoglobin A1C  05/05/2022  ? Eye exam for diabetics  10/16/2022  ? Colon Cancer Screening  08/17/2024  ? Pneumonia Vaccine  Completed  ? Flu Shot  Completed  ? DEXA scan (bone density measurement)  Completed  ? Hepatitis C Screening: USPSTF Recommendation to screen - Ages 101-79 yo.  Completed  ? Zoster (Shingles) Vaccine  Completed  ? HPV Vaccine  Aged Out  ? COVID-19 Vaccine  Discontinued  ?*Topic was postponed. The date shown is not the original due date.  ?  ?

## 2022-02-09 NOTE — Progress Notes (Addendum)
Subjective:   Stacie Mcdonald is a 76 y.o. female who presents for Medicare Annual (Subsequent) preventive examination.  Review of Systems    No ROS.  Medicare Wellness Virtual Visit.  Visual/audio telehealth visit, UTA vital signs.   See social history for additional risk factors.   Cardiac Risk Factors include: advanced age (>12men, >36 women)     Objective:    Today's Vitals   02/09/22 1010  Weight: 186 lb (84.4 kg)  Height: 5\' 4"  (1.626 m)   Body mass index is 31.93 kg/m.     02/09/2022   10:14 AM 02/27/2021    5:55 PM 02/20/2021    2:13 PM 07/30/2020   12:49 PM 05/01/2020    9:34 AM 08/18/2019    9:14 AM 07/28/2019   10:20 AM  Advanced Directives  Does Patient Have a Medical Advance Directive? Yes Yes No Yes Yes Yes Yes  Type of Estate agent of [place];Living will   Healthcare Power of [place];Living will Healthcare Power of [place];Living will Healthcare Power of [place];Living will Healthcare Power of [place];Living will  Does patient want to make changes to medical advance directive? No - Patient declined No - Patient declined  No - Patient declined No - Patient declined No - Guardian declined No - Patient declined  Copy of Healthcare Power of Attorney in Chart? No - copy requested   No - copy requested No - copy requested No - copy requested No - copy requested  Would patient like information on creating a medical advance directive?   No - Patient declined       Current Medications (verified) Outpatient Encounter Medications as of 02/09/2022  Medication Sig   aspirin 81 MG tablet Take 81 mg by mouth daily.   clopidogrel (PLAVIX) 75 MG tablet TAKE ONE TABLET BY MOUTH DAILY (Patient taking differently: Take 75 mg by mouth daily.)   D-MANNOSE PO Take 800 mg by mouth daily.   ELDERBERRY PO Take 1,000 mg by mouth daily.   empagliflozin (JARDIANCE) 10 MG TABS tablet Take 1 tablet (10 mg total) by mouth daily before breakfast.   furosemide (LASIX)  40 MG tablet Take 40 mg by mouth daily.   GLUCOSAMINE-CHONDROITIN PO Take 2 tablets by mouth daily.   isosorbide mononitrate (IMDUR) 120 MG 24 hr tablet Take 120 mg by mouth daily.    Lancets (FREESTYLE) lancets uSE TO CHECK SUGARS ONCE DAILY e11.9   loratadine (CLARITIN) 10 MG tablet Take 10 mg by mouth daily as needed for allergies.   metoprolol (TOPROL-XL) 200 MG 24 hr tablet TAKE 1 TABLET BY MOUTH DAILY. (Patient taking differently: Take 200 mg by mouth daily. TAKE 1 TABLET BY MOUTH DAILY.)   nitroGLYCERIN (NITROSTAT) 0.4 MG SL tablet Place 0.4 mg under the tongue every 5 (five) minutes as needed for chest pain.   omeprazole (PRILOSEC) 20 MG capsule Take 1 capsule (20 mg total) by mouth daily. (Patient taking differently: Take 20 mg by mouth daily as needed (acid reflux).)   ONE TOUCH ULTRA TEST test strip CHECK DAILY   OVER THE COUNTER MEDICATION Take 50 mg by mouth daily. Blood sugar support with Pyerogel   PRALUENT 75 MG/ML SOAJ Inject 75 mg into the muscle every 14 (fourteen) days.   ranolazine (RANEXA) 500 MG 12 hr tablet Take 500 mg by mouth 2 (two) times daily.   sacubitril-valsartan (ENTRESTO) 49-51 MG Take 1 tablet by mouth 2 (two) times daily.   spironolactone (ALDACTONE) 25 MG tablet Take 1  tablet (25 mg total) by mouth daily.   traZODone (DESYREL) 50 MG tablet Take 0.5-1 tablets (25-50 mg total) by mouth at bedtime as needed for sleep.   No facility-administered encounter medications on file as of 02/09/2022.   Allergies (verified) Penicillins and Statins   History: Past Medical History:  Diagnosis Date   Allergy    Arthritis    fingers, knees   Asthma    Breast Cancer 1991   R mastectomy. XRT , chemo   Breast cancer (HCC) 1991   RT MASTECTOMY   CAD (coronary artery disease) 2008   50% LAD occlusion 2009,  stent in mid LAD 2008 for 95%    Chicken pox    Colon polyps April 2009   by Colonoscopy, [place]   GERD (gastroesophageal reflux disease)    Heart disease     Hyperlipidemia    Hypertension    Myocardial infarction (HCC)    silent. 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   Sleep apnea    CPAP   Stable angina (HCC) 07/27/2019   Thoracic vertebral fracture (HCC) 04/05/2012   Urinary tract infection    Wears contact lenses    left only   Past Surgical History:  Procedure Laterality Date   BREAST BIOPSY     right breast reconstruction S/P mastectomy   BREAST SURGERY     right mastectomy   CATARACT EXTRACTION W/PHACO Left 05/01/2020   Procedure: CATARACT EXTRACTION PHACO AND INTRAOCULAR LENS PLACEMENT (IOC) LEFT 5.45  00:48.6  11.2%;  Surgeon: Lockie Mola, MD;  Location: [facility];  Service: Ophthalmology;  Laterality: Left;  sleep apnea   COLONOSCOPY WITH PROPOFOL N/A 08/18/2019   Procedure: COLONOSCOPY WITH PROPOFOL;  Surgeon: Midge Minium, MD;  Location: [facility];  Service: Endoscopy;  Laterality: N/A;  sleep apnea   CORONARY ANGIOPLASTY WITH STENT PLACEMENT  Jan 2013   mid RCA 75% occlusion resolved to 0%, [place]   CORONARY STENT INTERVENTION N/A 04/07/2018   Procedure: CORONARY STENT INTERVENTION;  Surgeon: Alwyn Pea, MD;  Location: [facility];  Service: Cardiovascular;  Laterality: N/A;   CORONARY STENT INTERVENTION N/A 02/20/2021   Procedure: CORONARY STENT INTERVENTION;  Surgeon: Alwyn Pea, MD;  Location: [facility];  Service: Cardiovascular;  Laterality: N/A;   LEFT HEART CATH AND CORONARY ANGIOGRAPHY N/A 02/20/2021   Procedure: LEFT HEART CATH AND CORONARY ANGIOGRAPHY;  Surgeon: Alwyn Pea, MD;  Location: [facility];  Service: Cardiovascular;  Laterality: N/A;   MASTECTOMY Right 1991   BREAST CA   POLYPECTOMY  08/18/2019   Procedure: POLYPECTOMY;  Surgeon: Midge Minium, MD;  Location: [facility];  Service: Endoscopy;;    PSEUDOANERYSM COMPRESSION N/A 02/27/2021   Procedure: Chiquita Loth COMPRESSION;  Surgeon: Renford Dills, MD;  Location: [facility];  Service: Cardiovascular;  Laterality: N/A;   RIGHT/LEFT HEART CATH AND CORONARY ANGIOGRAPHY N/A 04/07/2018   Procedure: RIGHT/LEFT HEART CATH AND CORONARY ANGIOGRAPHY;  Surgeon: Alwyn Pea, MD;  Location: [facility];  Service: Cardiovascular;  Laterality: N/A;   TONSILLECTOMY     Family History  Problem Relation Age of Onset   Heart disease Mother 69       massive MI   Heart disease Father 79       AMI   Cancer Father  metastatic lung CA   Diabetes Sister    Cancer Maternal Aunt    Breast cancer Maternal Aunt 9   Breast cancer Cousin        66s   Breast cancer Cousin        48s   Social History   Socioeconomic History   Marital status: Married    Spouse name: Gabriel Carina    Number of children: 4   Years of education: bachelors   Highest education level: Not on file  Occupational History   Occupation: RETIRED  Tobacco Use   Smoking status: Never   Smokeless tobacco: Never  Vaping Use   Vaping Use: Never used  Substance and Sexual Activity   Alcohol use: Yes    Alcohol/week: 1.0 standard drink    Types: 1 Glasses of wine per week    Comment: occasional wine   Drug use: No   Sexual activity: Yes  Other Topics Concern   Not on file  Social History Narrative   Not on file   Social Determinants of Health   Financial Resource Strain: Low Risk    Difficulty of Paying Living Expenses: Not hard at all  Food Insecurity: No Food Insecurity   Worried About Programme researcher, broadcasting/film/video in the Last Year: Never true   Ran Out of Food in the Last Year: Never true  Transportation Needs: No Transportation Needs   Lack of Transportation (Medical): No   Lack of Transportation (Non-Medical): No  Physical Activity: Sufficiently Active   Days of Exercise per Week: 4 days   Minutes of Exercise per Session: 60 min   Stress: No Stress Concern Present   Feeling of Stress : Not at all  Social Connections: Socially Integrated   Frequency of Communication with Friends and Family: More than three times a week   Frequency of Social Gatherings with Friends and Family: More than three times a week   Attends Religious Services: More than 4 times per year   Active Member of [employer] or Organizations: Yes   Attends Engineer, structural: More than 4 times per year   Marital Status: Married   Tobacco Counseling Counseling given: Not Answered   Clinical Intake:  Pre-visit preparation completed: Yes       Diabetes: Yes (Followed by PCP)  How often do you need to have someone help you when you read instructions, pamphlets, or other written materials from your doctor or pharmacy?: 1 - Never   Interpreter Needed?: No    Activities of Daily Living    02/09/2022   10:16 AM 02/27/2021    5:53 PM  In your present state of health, do you have any difficulty performing the following activities:  Hearing? 0 0  Vision? 0 0  Difficulty concentrating or making decisions? 0 0  Walking or climbing stairs?  0  Comment SOBOE. Paces self.   Dressing or bathing? 0 0  Doing errands, shopping? 0   Preparing Food and eating ? N   Using the Toilet? N   In the past six months, have you accidently leaked urine? N   Do you have problems with loss of bowel control? N   Managing your Medications? N   Managing your Finances? N   Housekeeping or managing your Housekeeping? N    Patient Care Team: Sherlene Shams, MD as PCP - General (Internal Medicine)  Indicate any recent Medical Services you may have received from other than [facility] providers in the past year (  date may be approximate).     Assessment:   This is a routine wellness examination for Stacie Mcdonald.  Virtual Visit via Telephone Note  I connected with  Stacie Mcdonald on 02/09/22 at 10:00 AM EDT by telephone and verified that I am speaking with the  correct person using two identifiers.  Persons participating in the virtual visit: patient/Nurse Health Advisor   I discussed the limitations of performing an evaluation and management service by telehealth. The patient expressed understanding and agreed to proceed. We continued and completed visit with audio only.  Some vital signs may be absent or patient reported.   Hearing/Vision screen Hearing Screening - Comments:: Patient is able to hear conversational tones without difficulty.  No issues reported. Vision Screening - Comments:: Cataract extraction, bilateral They have seen their ophthalmologist in the last 12 months.   Dietary issues and exercise activities discussed: Current Exercise Habits: Home exercise routine, Type of exercise: calisthenics;stretching (Silver sneaker program), Time (Minutes): 60, Frequency (Times/Week): 3, Weekly Exercise (Minutes/Week): 180, Intensity: Mild  Weight watchers Good water intake   Goals Addressed               This Visit's Progress     Patient Stated     Weight goal 165lb (pt-stated)        Stay active Healthy diet       Depression Screen    02/09/2022   10:12 AM 11/04/2021    2:57 PM 07/30/2020   12:50 PM 12/08/2019    8:42 AM 07/28/2019   10:22 AM 06/09/2018   10:22 AM 05/12/2018    1:35 PM  PHQ 2/9 Scores  PHQ - 2 Score 0 0 0 0 0 0 0  PHQ- 9 Score    0   3    Fall Risk    02/09/2022   10:15 AM 03/31/2021    3:53 PM 07/30/2020   12:50 PM 07/28/2019   10:38 AM 07/28/2019   10:22 AM  Fall Risk   Falls in the past year? 0 0 0 0 0  Number falls in past yr: 0 0 0    Injury with Fall?  0     Follow up Falls evaluation completed Falls evaluation completed Falls evaluation completed Falls evaluation completed     FALL RISK PREVENTION PERTAINING TO THE HOME: Home free of loose throw rugs in walkways, pet beds, electrical cords, etc? Yes  Adequate lighting in your home to reduce risk of falls? Yes   ASSISTIVE DEVICES  UTILIZED TO PREVENT FALLS: Life alert? No  Use of a cane, walker or w/c? No   TIMED UP AND GO: Was the test performed? No .   Cognitive Function:    06/09/2018   10:26 AM 06/08/2017   10:40 AM  MMSE - Mini Mental State Exam  Orientation to time 5 5  Orientation to Place 5 5  Registration 3 3  Attention/ Calculation 5 5  Recall 3 3  Language- name 2 objects 2 2  Language- repeat 1 1  Language- follow 3 step command 3 3  Language- read & follow direction 1 1  Write a sentence 1 1  Copy design 1 1  Total score 30 30        07/28/2019   10:23 AM  6CIT Screen  What Year? 0 points  What month? 0 points  What time? 0 points  Count back from 20 0 points  Months in reverse 0 points  Repeat phrase 0 points  Total Score 0 points    Immunizations Immunization History  Administered Date(s) Administered   Influenza Split 09/03/2013, 08/01/2014   Influenza, High Dose Seasonal PF 09/03/2017, 07/27/2018, 07/27/2019   Influenza-Unspecified 08/16/2012, 08/25/2021   Pneumococcal Conjugate-13 02/13/2015   Pneumococcal Polysaccharide-23 12/05/2009, 06/08/2017   Td 03/13/2010   Tdap 12/06/2003   Zoster Recombinat (Shingrix) 08/26/2018, 11/30/2018    TDAP status: Due, Education has been provided regarding the importance of this vaccine. Advised may receive this vaccine at local pharmacy or Health Dept. Aware to provide a copy of the vaccination record if obtained from local pharmacy or Health Dept. Verbalized acceptance and understanding. Deferred.   Covid vaccine- discontinued.   Screening Tests Health Maintenance  Topic Date Due   FOOT EXAM  02/14/2022 (Originally 09/10/2017)   TETANUS/TDAP  02/10/2023 (Originally 03/13/2020)   HEMOGLOBIN A1C  05/05/2022   OPHTHALMOLOGY EXAM  10/16/2022   COLONOSCOPY (Pts 45-31yrs Insurance coverage will need to be confirmed)  08/17/2024   Pneumonia Vaccine 72+ Years old  Completed   INFLUENZA VACCINE  Completed   DEXA SCAN  Completed    Hepatitis C Screening  Completed   Zoster Vaccines- Shingrix  Completed   HPV VACCINES  Aged Out   COVID-19 Vaccine  Discontinued   Health Maintenance There are no preventive care reminders to display for this patient.  Mammogram- completed 2022.   Lung Cancer Screening: (Low Dose CT Chest recommended if Age 57-80 years, 30 pack-year currently smoking OR have quit w/in 15years.) does not qualify.   Hepatitis C Screening: completed.  Vision Screening: Recommended annual ophthalmology exams for early detection of glaucoma and other disorders of the eye.  Dental Screening: Recommended annual dental exams for proper oral hygiene  Community Resource Referral / Chronic Care Management: CRR required this visit?  No   CCM required this visit?  No      Plan:   Keep all routine maintenance appointments.   I have personally reviewed and noted the following in the patient's chart:   Medical and social history Use of alcohol, tobacco or illicit drugs  Current medications and supplements including opioid prescriptions.  Functional ability and status Nutritional status Physical activity Advanced directives List of other physicians Hospitalizations, surgeries, and ER visits in previous 12 months Vitals Screenings to include cognitive, depression, and falls Referrals and appointments  In addition, I have reviewed and discussed with patient certain preventive protocols, quality metrics, and best practice recommendations. A written personalized care plan for preventive services as well as general preventive health recommendations were provided to patient.     OBrien-Blaney, Marayah Higdon L, LPN   1/61/0960     I have reviewed the above information and agree with above.   Duncan Dull, MD

## 2022-04-27 DIAGNOSIS — G4733 Obstructive sleep apnea (adult) (pediatric): Secondary | ICD-10-CM | POA: Diagnosis not present

## 2022-05-01 DIAGNOSIS — D225 Melanocytic nevi of trunk: Secondary | ICD-10-CM | POA: Diagnosis not present

## 2022-05-01 DIAGNOSIS — D2262 Melanocytic nevi of left upper limb, including shoulder: Secondary | ICD-10-CM | POA: Diagnosis not present

## 2022-05-01 DIAGNOSIS — D2261 Melanocytic nevi of right upper limb, including shoulder: Secondary | ICD-10-CM | POA: Diagnosis not present

## 2022-05-01 DIAGNOSIS — Z85828 Personal history of other malignant neoplasm of skin: Secondary | ICD-10-CM | POA: Diagnosis not present

## 2022-05-05 ENCOUNTER — Encounter: Payer: Self-pay | Admitting: Internal Medicine

## 2022-05-06 ENCOUNTER — Telehealth: Payer: Self-pay

## 2022-05-06 ENCOUNTER — Encounter: Payer: Self-pay | Admitting: Internal Medicine

## 2022-05-06 ENCOUNTER — Ambulatory Visit (INDEPENDENT_AMBULATORY_CARE_PROVIDER_SITE_OTHER): Payer: Medicare Other | Admitting: Internal Medicine

## 2022-05-06 VITALS — BP 118/70 | HR 91 | Temp 98.3°F | Ht 64.0 in | Wt 182.4 lb

## 2022-05-06 DIAGNOSIS — I10 Essential (primary) hypertension: Secondary | ICD-10-CM | POA: Diagnosis not present

## 2022-05-06 DIAGNOSIS — E1169 Type 2 diabetes mellitus with other specified complication: Secondary | ICD-10-CM

## 2022-05-06 DIAGNOSIS — I25118 Atherosclerotic heart disease of native coronary artery with other forms of angina pectoris: Secondary | ICD-10-CM | POA: Diagnosis not present

## 2022-05-06 DIAGNOSIS — E785 Hyperlipidemia, unspecified: Secondary | ICD-10-CM | POA: Diagnosis not present

## 2022-05-06 DIAGNOSIS — J984 Other disorders of lung: Secondary | ICD-10-CM | POA: Diagnosis not present

## 2022-05-06 MED ORDER — TRAZODONE HCL 150 MG PO TABS: 150.0000 mg | ORAL_TABLET | Freq: Every day | ORAL | 1 refills | Status: DC

## 2022-05-06 NOTE — Telephone Encounter (Signed)
Medication Samples have been provided to the patient.  Drug name: Jardiance       Strength: 10 mg         Qty: 1 box  LOT: [id number]  Exp.Date: 11/17/2023  Dosing instructions: Take 1 tablet daily for 7 days.   The patient has been instructed regarding the correct time, dose, and frequency of taking this medication, including desired effects and most common side effects.   Caydyn Sprung 2:25 PM 05/06/2022     Medication Samples have been provided to the patient.  Drug name: Jardiance      Strength: 25 mg        Qty: 2 boxes  LOT: [id number]   Exp.Date: 07/18/2023  Dosing instructions: Take 1 tablet daily.   The patient has been instructed regarding the correct time, dose, and frequency of taking this medication, including desired effects and most common side effects.   Azrael Huss 2:26 PM 05/06/2022

## 2022-05-06 NOTE — Progress Notes (Signed)
Subjective:  Patient ID: Stacie Mcdonald, female    DOB: 03/15/1946  Age: 76 y.o. MRN: [id number]  CC: The primary encounter diagnosis was Essential hypertension. Diagnoses of Type 2 diabetes mellitus with hyperlipidemia ([place]), Coronary artery disease of native artery of native heart with stable angina pectoris ([place]), Hyperlipidemia associated with type 2 diabetes mellitus ([place]), and Lung disease, restrictive were also pertinent to this visit.   HPI Stacie Mcdonald presents for follow up on multiple issues  Chief Complaint  Patient presents with   Follow-up    6 month follow up    1) T2DM:   She  feels generally well,  and she is  exercising in a dance class 3 days per week and trying to lose weight. Checking  blood sugars less than once daily at variable times, usually only if she feels she may be having a hypoglycemic event. .  BS have been under 130 fasting and < 150 post prandially.  Denies any recent hypoglyemic events.   COULD NOT afford jardiance($150/month) . Following a carbohydrate modified diet 6 days per week. Denies numbness, burning and tingling of extremities. Appetite is good.    2) CAD:  she has been asymptomatic.     3) HYPERLIPIDEMIA  Praluent denied,  then finally approved ut stil $500/month . Now receiving it freely  via a GRANT for a year.    Outpatient Medications Prior to Visit  Medication Sig Dispense Refill   aspirin 81 MG tablet Take 81 mg by mouth daily.     clopidogrel (PLAVIX) 75 MG tablet TAKE ONE TABLET BY MOUTH DAILY (Patient taking differently: Take 75 mg by mouth daily.) 90 tablet 1   D-MANNOSE PO Take 800 mg by mouth daily.     ELDERBERRY PO Take 1,000 mg by mouth daily.     furosemide (LASIX) 40 MG tablet Take 40 mg by mouth daily.     GLUCOSAMINE-CHONDROITIN PO Take 2 tablets by mouth daily.     isosorbide mononitrate (IMDUR) 120 MG 24 hr tablet Take 120 mg by mouth daily.      Lancets (FREESTYLE) lancets uSE TO CHECK SUGARS ONCE DAILY e11.9 100  each 3   loratadine (CLARITIN) 10 MG tablet Take 10 mg by mouth daily as needed for allergies.     metoprolol (TOPROL-XL) 200 MG 24 hr tablet TAKE 1 TABLET BY MOUTH DAILY. (Patient taking differently: Take 200 mg by mouth daily. TAKE 1 TABLET BY MOUTH DAILY.) 90 tablet 1   nitroGLYCERIN (NITROSTAT) 0.4 MG SL tablet Place 0.4 mg under the tongue every 5 (five) minutes as needed for chest pain.     omeprazole (PRILOSEC) 20 MG capsule Take 1 capsule (20 mg total) by mouth daily. (Patient taking differently: Take 20 mg by mouth daily as needed (acid reflux).) 90 capsule 1   ONE TOUCH ULTRA TEST test strip CHECK DAILY 100 each 2   OVER THE COUNTER MEDICATION Take 50 mg by mouth daily. Blood sugar support with Pyerogel     PRALUENT 75 MG/ML SOAJ Inject 75 mg into the muscle every 14 (fourteen) days.     ranolazine (RANEXA) 500 MG 12 hr tablet Take 500 mg by mouth 2 (two) times daily.     sacubitril-valsartan (ENTRESTO) 49-51 MG Take 1 tablet by mouth 2 (two) times daily.     spironolactone (ALDACTONE) 25 MG tablet Take 1 tablet (25 mg total) by mouth daily. 90 tablet 0   traZODone (DESYREL) 50 MG tablet Take 0.5-1 tablets (  25-50 mg total) by mouth at bedtime as needed for sleep. 90 tablet 1   empagliflozin (JARDIANCE) 10 MG TABS tablet Take 1 tablet (10 mg total) by mouth daily before breakfast. (Patient not taking: Reported on 05/06/2022) 90 tablet 1   No facility-administered medications prior to visit.    Review of Systems;  Patient denies headache, fevers, malaise, unintentional weight loss, skin rash, eye pain, sinus congestion and sinus pain, sore throat, dysphagia,  hemoptysis , cough, dyspnea, wheezing, chest pain, palpitations, orthopnea, edema, abdominal pain, nausea, melena, diarrhea, constipation, flank pain, dysuria, hematuria, urinary  Frequency, nocturia, numbness, tingling, seizures,  Focal weakness, Loss of consciousness,  Tremor, insomnia, depression, anxiety, and suicidal ideation.       Objective:  BP 118/70 (BP Location: Left Arm, Patient Position: Sitting, Cuff Size: Normal)   Pulse 91   Temp 98.3 F (36.8 C) (Oral)   Ht _0  (1.626 m)   Wt 182 lb 6.4 oz (82.7 kg)   SpO2 96%   BMI 31.31 kg/m   BP Readings from Last 3 Encounters:  05/06/22 118/70  11/04/21 112/78  03/31/21 110/82    Wt Readings from Last 3 Encounters:  05/06/22 182 lb 6.4 oz (82.7 kg)  02/09/22 186 lb (84.4 kg)  11/04/21 186 lb (84.4 kg)    General appearance: alert, cooperative and appears stated age Ears: normal TM's and external ear canals both ears Throat: lips, mucosa, and tongue normal; teeth and gums normal Neck: no adenopathy, no carotid bruit, supple, symmetrical, trachea midline and thyroid not enlarged, symmetric, no tenderness/mass/nodules Back: symmetric, no curvature. ROM normal. No CVA tenderness. Lungs: clear to auscultation bilaterally Heart: regular rate and rhythm, S1, S2 normal, no murmur, click, rub or gallop Abdomen: soft, non-tender; bowel sounds normal; no masses,  no organomegaly Pulses: 2+ and symmetric Skin: Skin color, texture, turgor normal. No rashes or lesions Lymph nodes: Cervical, supraclavicular, and axillary nodes normal.  Lab Results  Component Value Date   HGBA1C 6.6 (H) 05/06/2022   HGBA1C 6.5 11/04/2021   HGBA1C 6.3 03/31/2021    Lab Results  Component Value Date   CREATININE 0.94 05/06/2022   CREATININE 0.89 11/04/2021   CREATININE 0.88 03/31/2021    Lab Results  Component Value Date   WBC 7.3 11/04/2021   HGB 13.6 11/04/2021   HCT 41.3 11/04/2021   PLT 254.0 11/04/2021   GLUCOSE 104 (H) 05/06/2022   CHOL 135 05/06/2022   TRIG 172.0 (H) 05/06/2022   HDL 42.70 05/06/2022   LDLDIRECT 75.0 05/06/2022   LDLCALC 58 05/06/2022   ALT 19 05/06/2022   AST 15 05/06/2022   NA 138 05/06/2022   K 4.3 05/06/2022   CL 99 05/06/2022   CREATININE 0.94 05/06/2022   BUN 21 05/06/2022   CO2 27 05/06/2022   TSH 3.01 11/04/2021   INR  1.0 09/27/2012   HGBA1C 6.6 (H) 05/06/2022   MICROALBUR <0.7 05/06/2022    MM 3D SCREEN BREAST UNI LEFT  Result Date: 10/29/2021 CLINICAL DATA:  Screening. EXAM: DIGITAL SCREENING UNILATERAL LEFT MAMMOGRAM WITH CAD AND TOMOSYNTHESIS TECHNIQUE: Left screening digital craniocaudal and mediolateral oblique mammograms were obtained. Left screening digital breast tomosynthesis was performed. The images were evaluated with computer-aided detection. COMPARISON:  Previous exam(s). ACR Breast Density Category c: The breast tissue is heterogeneously dense, which may obscure small masses. FINDINGS: The patient has had a right mastectomy. There are no findings suspicious for malignancy. IMPRESSION: No mammographic evidence of malignancy. A result letter of this screening mammogram  will be mailed directly to the patient. RECOMMENDATION: Screening mammogram in one year.  (Code:SM-L-8M) BI-RADS CATEGORY  1: Negative. Electronically Signed   By: Nolon Nations M.D.   On: 10/29/2021 13:00   Assessment & Plan:   Problem List Items Addressed This Visit     CAD (coronary artery disease)    She has stable angina.  Continue Ranexa, Praluent. Plavix and ASA       Essential hypertension - Primary   Relevant Orders   Comp Met (CMET) (Completed)   Urine Microalbumin w/creat. ratio (Completed)   Lung disease, restrictive    Recent PFTs noted a mixed pattern..  She is dyspneic with walking but is exercising regularly in a group class for senior citizens.  Recommend using a treadmill and pulse oximetry to improve endurance and build confidence .  Referral to cardiopulmonary rehab deferred by patient       Hyperlipidemia associated with type 2 diabetes mellitus ([place])      She remains well-controlled on diet alone because she could not afford Jardiance. Samples given for 3 months .  Referring patient to [facility] for pharmaceutical assistance. . Patient is intolerant of  statin therapy for CAD risk reduction but is  tolerating a PCSK9 (Praluent).   Lab Results  Component Value Date   HGBA1C 6.6 (H) 05/06/2022     Lab Results  Component Value Date   ALT 19 05/06/2022   AST 15 05/06/2022   ALKPHOS 44 05/06/2022   BILITOT 0.5 05/06/2022   Lab Results  Component Value Date   CHOL 135 05/06/2022   HDL 42.70 05/06/2022   LDLCALC 58 05/06/2022   LDLDIRECT 75.0 05/06/2022   TRIG 172.0 (H) 05/06/2022   CHOLHDL 3 05/06/2022         Other Visit Diagnoses     Type 2 diabetes mellitus with hyperlipidemia ([facility])       Relevant Orders   Hemoglobin A1c (Completed)   Lipid Profile (Completed)   Direct LDL (Completed)   Comp Met (CMET) (Completed)   Urine Microalbumin w/creat. ratio (Completed)       I spent a total of  33  minutes with this patient in a face to face visit on the date of this encounter reviewing the last office visit with her cardioloigst in February  most recent visit with  patient's pulmonologist and PFTs, patient's diet and eating habits, home blood pressure readings ,  and post visit ordering of testing and therapeutics.   Follow-up: No follow-ups on file.   Crecencio Mc, MD

## 2022-05-06 NOTE — Progress Notes (Signed)
No new issues or concerns, but not taking Jardiance because copay was too high. Needs refill of tramadol

## 2022-05-06 NOTE — Addendum Note (Signed)
Addended by: Crecencio Mc on: 05/06/2022 04:26 PM   Modules accepted: Orders

## 2022-05-06 NOTE — Telephone Encounter (Signed)
Spoke with pt and informed her of the dose change. Pt gave a verbal understanding.

## 2022-05-06 NOTE — Telephone Encounter (Signed)
Spoke with pt and she stated that she needs a refill of the Trazodone, however she stated that 1 tablet did not allow her to rest so she has had to take 3 at time to be able to sleep at night. Pt would like to know if rx can be refilled for 3 tablets at bedtime.

## 2022-05-06 NOTE — Patient Instructions (Signed)
Stacie Mcdonald is a very good drug .  Start with 10 mg dose and increase to 25 mg after one month  I will try to get you some assistance going forward .  Expect contact from Denyse Amass from [facility]

## 2022-05-06 NOTE — Telephone Encounter (Signed)
DOSE INCREASED TO A 150 MG TABLET.  MAKE SURE SHE SEES THAT SHE ONLY TAKES ONE

## 2022-05-07 LAB — COMPREHENSIVE METABOLIC PANEL
ALT: 19 U/L (ref 0–35)
AST: 15 U/L (ref 0–37)
Albumin: 4.7 g/dL (ref 3.5–5.2)
Alkaline Phosphatase: 44 U/L (ref 39–117)
BUN: 21 mg/dL (ref 6–23)
CO2: 27 mEq/L (ref 19–32)
Calcium: 9.9 mg/dL (ref 8.4–10.5)
Chloride: 99 mEq/L (ref 96–112)
Creatinine, Ser: 0.94 mg/dL (ref 0.40–1.20)
GFR: 58.6 mL/min — ABNORMAL LOW (ref >60.00)
Glucose, Bld: 104 mg/dL — ABNORMAL HIGH (ref 70–99)
Potassium: 4.3 mEq/L (ref 3.5–5.1)
Sodium: 138 mEq/L (ref 135–145)
Total Bilirubin: 0.5 mg/dL (ref 0.2–1.2)
Total Protein: 7.5 g/dL (ref 6.0–8.3)

## 2022-05-07 LAB — LDL CHOLESTEROL, DIRECT: Direct LDL: 75 mg/dL

## 2022-05-07 LAB — MICROALBUMIN / CREATININE URINE RATIO
Creatinine,U: 25.2 mg/dL
Microalb Creat Ratio: 2.8 mg/g (ref 0.0–30.0)
Microalb, Ur: <0.7 mg/dL (ref 0.0–1.9)

## 2022-05-07 LAB — LIPID PANEL
Cholesterol: 135 mg/dL (ref 0–200)
HDL: 42.7 mg/dL (ref >39.00)
LDL Cholesterol: 58 mg/dL (ref 0–99)
NonHDL: 91.94
Total CHOL/HDL Ratio: 3
Triglycerides: 172 mg/dL — ABNORMAL HIGH (ref 0.0–149.0)
VLDL: 34.4 mg/dL (ref 0.0–40.0)

## 2022-05-07 LAB — HEMOGLOBIN A1C: Hgb A1c MFr Bld: 6.6 % — ABNORMAL HIGH (ref 4.6–6.5)

## 2022-05-07 NOTE — Assessment & Plan Note (Signed)
Recent PFTs noted a mixed pattern..  She is dyspneic with walking but is exercising regularly in a group class for senior citizens.  Recommend using a treadmill and pulse oximetry to improve endurance and build confidence .  Referral to cardiopulmonary rehab deferred by patient

## 2022-05-07 NOTE — Assessment & Plan Note (Signed)
She remains well-controlled on diet alone because she could not afford Jardiance. Samples given for 3 months .  Referring patient to [facility] for pharmaceutical assistance. . Patient is intolerant of  statin therapy for CAD risk reduction but is tolerating a PCSK9 (Praluent).   Lab Results  Component Value Date   HGBA1C 6.6 (H) 05/06/2022     Lab Results  Component Value Date   ALT 19 05/06/2022   AST 15 05/06/2022   ALKPHOS 44 05/06/2022   BILITOT 0.5 05/06/2022   Lab Results  Component Value Date   CHOL 135 05/06/2022   HDL 42.70 05/06/2022   LDLCALC 58 05/06/2022   LDLDIRECT 75.0 05/06/2022   TRIG 172.0 (H) 05/06/2022   CHOLHDL 3 05/06/2022

## 2022-06-18 ENCOUNTER — Other Ambulatory Visit: Payer: Self-pay

## 2022-07-13 DIAGNOSIS — J986 Disorders of diaphragm: Secondary | ICD-10-CM | POA: Diagnosis not present

## 2022-07-13 DIAGNOSIS — R0600 Dyspnea, unspecified: Secondary | ICD-10-CM | POA: Diagnosis not present

## 2022-07-13 DIAGNOSIS — R000 Tachycardia, unspecified: Secondary | ICD-10-CM | POA: Diagnosis not present

## 2022-07-13 DIAGNOSIS — G4733 Obstructive sleep apnea (adult) (pediatric): Secondary | ICD-10-CM | POA: Diagnosis not present

## 2022-08-18 ENCOUNTER — Other Ambulatory Visit: Payer: Self-pay | Admitting: Internal Medicine

## 2022-08-18 NOTE — Telephone Encounter (Signed)
Medication was discontinued on 05/06/2022. Is it okay to refuse refill?

## 2022-08-20 NOTE — Telephone Encounter (Signed)
LMTCB. Need to find out if pt is taking [place]

## 2022-08-20 NOTE — Telephone Encounter (Signed)
Patient states she is returning our call.  I read Adair Laundry, CMA's message to patient.  Patient states she is taking Jardiance.

## 2022-08-21 NOTE — Telephone Encounter (Signed)
Pt stated that she is taking the jardiance. Is it okay to refill?

## 2022-08-28 DIAGNOSIS — G4733 Obstructive sleep apnea (adult) (pediatric): Secondary | ICD-10-CM | POA: Diagnosis not present

## 2022-09-14 ENCOUNTER — Encounter (INDEPENDENT_AMBULATORY_CARE_PROVIDER_SITE_OTHER): Payer: Self-pay

## 2022-09-17 ENCOUNTER — Other Ambulatory Visit: Payer: Self-pay | Admitting: Internal Medicine

## 2022-09-17 DIAGNOSIS — Z1231 Encounter for screening mammogram for malignant neoplasm of breast: Secondary | ICD-10-CM

## 2022-10-14 ENCOUNTER — Other Ambulatory Visit: Payer: Self-pay

## 2022-10-14 MED ORDER — EMPAGLIFLOZIN 10 MG PO TABS: 10.0000 mg | ORAL_TABLET | Freq: Every day | ORAL | 1 refills | Status: DC

## 2022-10-22 ENCOUNTER — Other Ambulatory Visit: Payer: Self-pay | Admitting: Internal Medicine

## 2022-10-30 ENCOUNTER — Ambulatory Visit
Admission: RE | Admit: 2022-10-30 | Discharge: 2022-10-30 | Disposition: A | Discharge status: Home / Self Care | Payer: Medicare Other | Source: Ambulatory Visit | Attending: Internal Medicine | Admitting: Internal Medicine

## 2022-10-30 DIAGNOSIS — Z1231 Encounter for screening mammogram for malignant neoplasm of breast: Secondary | ICD-10-CM | POA: Insufficient documentation

## 2022-11-06 ENCOUNTER — Ambulatory Visit: Payer: Medicare Other | Admitting: Internal Medicine

## 2022-11-11 ENCOUNTER — Ambulatory Visit: Payer: Medicare Other | Admitting: Internal Medicine

## 2022-11-11 ENCOUNTER — Other Ambulatory Visit: Payer: Self-pay

## 2022-11-11 MED ORDER — EMPAGLIFLOZIN 10 MG PO TABS: 10.0000 mg | ORAL_TABLET | Freq: Every day | ORAL | 1 refills | Status: DC

## 2022-11-23 ENCOUNTER — Ambulatory Visit: Payer: BLUE CROSS/BLUE SHIELD | Admitting: Internal Medicine

## 2022-11-23 ENCOUNTER — Encounter: Payer: Self-pay | Admitting: Internal Medicine

## 2022-11-23 VITALS — BP 122/66 | HR 80 | Temp 97.8°F | Ht 64.0 in | Wt 174.8 lb

## 2022-11-23 DIAGNOSIS — R5383 Other fatigue: Secondary | ICD-10-CM

## 2022-11-23 DIAGNOSIS — Z23 Encounter for immunization: Secondary | ICD-10-CM

## 2022-11-23 DIAGNOSIS — I10 Essential (primary) hypertension: Secondary | ICD-10-CM | POA: Diagnosis not present

## 2022-11-23 DIAGNOSIS — E1169 Type 2 diabetes mellitus with other specified complication: Secondary | ICD-10-CM | POA: Diagnosis not present

## 2022-11-23 DIAGNOSIS — E785 Hyperlipidemia, unspecified: Secondary | ICD-10-CM

## 2022-11-23 DIAGNOSIS — I25118 Atherosclerotic heart disease of native coronary artery with other forms of angina pectoris: Secondary | ICD-10-CM

## 2022-11-23 DIAGNOSIS — J849 Interstitial pulmonary disease, unspecified: Secondary | ICD-10-CM

## 2022-11-23 NOTE — Progress Notes (Unsigned)
Subjective:  Patient ID: Stacie Mcdonald, female    DOB: 1946/02/24  Age: 77 y.o. MRN: [id number]  CC: The primary encounter diagnosis was Essential hypertension. Diagnoses of Hyperlipidemia associated with type 2 diabetes mellitus ([place]), Type 2 diabetes mellitus with hyperlipidemia ([place]), and Other fatigue were also pertinent to this visit.   HPI [name] presents for  Chief Complaint  Patient presents with   Medical Management of Chronic Issues    Diabetes, hyperlipidemia, hypertension   1) T2DM:  She  feels generally well,  is  exercising regularly and trying to lose weight. lost 8 lbs since last visit cutting out sweets and skipping breakfast checking  blood sugars less than once daily at variable times, usually only if she feels she may be having a hypoglycemic event. .  BS have been under 130 fasting and < 150 post prandially.  Denies any recent hypoglyemic events.  Taking   medications as directed. Following a carbohydrate modified diet 6 days per week. Denies numbness, burning and tingling of extremities. Appetite is good.  Taking jardiance 10 mg daily  (for concurrent CAD).  Praluent and valsartan (vis Entresto)  2) Aortic atherosclerosis: Reviewed findings of prior CT scan today..  Patient has statin intolerance  due to myalgia but is tolerating Praluent    3) Lower energy: unclear if due to metoprolol 200 mg tablet or if due to SVT   4) OSA:  last sleep study 2020  uses CPAP every night . [facility] managing . Has not noticed a difference  Outpatient Medications Prior to Visit  Medication Sig Dispense Refill   aspirin 81 MG tablet Take 81 mg by mouth daily.     clopidogrel (PLAVIX) 75 MG tablet TAKE ONE TABLET BY MOUTH DAILY (Patient taking differently: Take 75 mg by mouth daily.) 90 tablet 1   D-MANNOSE PO Take 800 mg by mouth daily.     ELDERBERRY PO Take 1,000 mg by mouth daily.     empagliflozin (JARDIANCE) 10 MG TABS tablet Take 1 tablet (10 mg total) by mouth daily  before breakfast. 90 tablet 1   furosemide (LASIX) 40 MG tablet Take 40 mg by mouth daily.     GLUCOSAMINE-CHONDROITIN PO Take 2 tablets by mouth daily.     isosorbide mononitrate (IMDUR) 120 MG 24 hr tablet Take 120 mg by mouth daily.      Lancets (FREESTYLE) lancets uSE TO CHECK SUGARS ONCE DAILY e11.9 100 each 3   loratadine (CLARITIN) 10 MG tablet Take 10 mg by mouth daily as needed for allergies.     metoprolol (TOPROL-XL) 200 MG 24 hr tablet TAKE 1 TABLET BY MOUTH DAILY. (Patient taking differently: Take 200 mg by mouth daily. TAKE 1 TABLET BY MOUTH DAILY.) 90 tablet 1   nitroGLYCERIN (NITROSTAT) 0.4 MG SL tablet Place 0.4 mg under the tongue every 5 (five) minutes as needed for chest pain.     omeprazole (PRILOSEC) 20 MG capsule Take 1 capsule (20 mg total) by mouth daily. (Patient taking differently: Take 20 mg by mouth daily as needed (acid reflux).) 90 capsule 1   ONE TOUCH ULTRA TEST test strip CHECK DAILY 100 each 2   OVER THE COUNTER MEDICATION Take 50 mg by mouth daily. Blood sugar support with Pyerogel     PRALUENT 75 MG/ML SOAJ Inject 75 mg into the muscle every 14 (fourteen) days.     ranolazine (RANEXA) 500 MG 12 hr tablet Take 500 mg by mouth 2 (two) times daily.  sacubitril-valsartan (ENTRESTO) 49-51 MG Take 1 tablet by mouth 2 (two) times daily.     spironolactone (ALDACTONE) 25 MG tablet Take 1 tablet (25 mg total) by mouth daily. 90 tablet 0   traZODone (DESYREL) 150 MG tablet Take 1 tablet (150 mg total) by mouth at bedtime. 90 tablet 1   No facility-administered medications prior to visit.    Review of Systems;  Patient denies headache, fevers, malaise, unintentional weight loss, skin rash, eye pain, sinus congestion and sinus pain, sore throat, dysphagia,  hemoptysis , cough, dyspnea, wheezing, chest pain, palpitations, orthopnea, edema, abdominal pain, nausea, melena, diarrhea, constipation, flank pain, dysuria, hematuria, urinary  Frequency, nocturia, numbness,  tingling, seizures,  Focal weakness, Loss of consciousness,  Tremor, insomnia, depression, anxiety, and suicidal ideation.      Objective:  BP 122/66   Pulse 80   Temp 97.8 F (36.6 C) (Oral)   Ht '5\' 4"'$  (1.626 m)   Wt 174 lb 12.8 oz (79.3 kg)   SpO2 95%   BMI 30.00 kg/m   BP Readings from Last 3 Encounters:  11/23/22 122/66  05/06/22 118/70  11/04/21 112/78    Wt Readings from Last 3 Encounters:  11/23/22 174 lb 12.8 oz (79.3 kg)  05/06/22 182 lb 6.4 oz (82.7 kg)  02/09/22 186 lb (84.4 kg)    Physical Exam  Lab Results  Component Value Date   HGBA1C 6.6 (H) 05/06/2022   HGBA1C 6.5 11/04/2021   HGBA1C 6.3 03/31/2021    Lab Results  Component Value Date   CREATININE 0.94 05/06/2022   CREATININE 0.89 11/04/2021   CREATININE 0.88 03/31/2021    Lab Results  Component Value Date   WBC 7.3 11/04/2021   HGB 13.6 11/04/2021   HCT 41.3 11/04/2021   PLT 254.0 11/04/2021   GLUCOSE 104 (H) 05/06/2022   CHOL 135 05/06/2022   TRIG 172.0 (H) 05/06/2022   HDL 42.70 05/06/2022   LDLDIRECT 75.0 05/06/2022   LDLCALC 58 05/06/2022   ALT 19 05/06/2022   AST 15 05/06/2022   NA 138 05/06/2022   K 4.3 05/06/2022   CL 99 05/06/2022   CREATININE 0.94 05/06/2022   BUN 21 05/06/2022   CO2 27 05/06/2022   TSH 3.01 11/04/2021   INR 1.0 09/27/2012   HGBA1C 6.6 (H) 05/06/2022   MICROALBUR <0.7 05/06/2022    MM 3D SCREEN BREAST UNI LEFT  Result Date: 11/02/2022 CLINICAL DATA:  Screening. EXAM: DIGITAL SCREENING UNILATERAL LEFT MAMMOGRAM WITH CAD AND TOMOSYNTHESIS TECHNIQUE: Left screening digital craniocaudal and mediolateral oblique mammograms were obtained. Left screening digital breast tomosynthesis was performed. The images were evaluated with computer-aided detection. COMPARISON:  Previous exam(s). ACR Breast Density Category b: There are scattered areas of fibroglandular density. FINDINGS: The patient has had a right mastectomy. There are no findings suspicious for  malignancy. IMPRESSION: No mammographic evidence of malignancy. A result letter of this screening mammogram will be mailed directly to the patient. RECOMMENDATION: Screening mammogram in one year.  (Code:SM-L-33M) BI-RADS CATEGORY  1: Negative. Electronically Signed   By: Margarette Canada M.D.   On: 11/02/2022 17:46    Assessment & Plan:  .Essential hypertension  Hyperlipidemia associated with type 2 diabetes mellitus ([place])  Type 2 diabetes mellitus with hyperlipidemia (HCC)  Other fatigue     I provided 30 minutes of face-to-face time during this encounter reviewing patient's last visit with me, patient's  most recent visit with cardiology,  nephrology,  and neurology,  recent surgical and non surgical procedures, previous  labs and imaging studies, counseling on currently addressed issues,  and post visit ordering to diagnostics and therapeutics .   Follow-up: No follow-ups on file.   Crecencio Mc, MD

## 2022-11-23 NOTE — Patient Instructions (Signed)
You received the influenza vaccine today  Checking iron , thyroid and b12 levels due to fatigue  We may increase the jardiance dose pending your labs

## 2022-11-24 ENCOUNTER — Encounter: Payer: Self-pay | Admitting: Internal Medicine

## 2022-11-24 DIAGNOSIS — J849 Interstitial pulmonary disease, unspecified: Secondary | ICD-10-CM | POA: Insufficient documentation

## 2022-11-24 LAB — COMPREHENSIVE METABOLIC PANEL
ALT: 17 U/L (ref 0–35)
AST: 14 U/L (ref 0–37)
Albumin: 4.6 g/dL (ref 3.5–5.2)
Alkaline Phosphatase: 43 U/L (ref 39–117)
BUN: 21 mg/dL (ref 6–23)
CO2: 28 mEq/L (ref 19–32)
Calcium: 9.4 mg/dL (ref 8.4–10.5)
Chloride: 101 mEq/L (ref 96–112)
Creatinine, Ser: 0.97 mg/dL (ref 0.40–1.20)
GFR: 56.21 mL/min — ABNORMAL LOW (ref >60.00)
Glucose, Bld: 95 mg/dL (ref 70–99)
Potassium: 4 mEq/L (ref 3.5–5.1)
Sodium: 140 mEq/L (ref 135–145)
Total Bilirubin: 0.5 mg/dL (ref 0.2–1.2)
Total Protein: 7.3 g/dL (ref 6.0–8.3)

## 2022-11-24 LAB — CBC WITH DIFFERENTIAL/PLATELET
Basophils Absolute: 0.1 10*3/uL (ref 0.0–0.1)
Basophils Relative: 0.8 % (ref 0.0–3.0)
Eosinophils Absolute: 0.1 10*3/uL (ref 0.0–0.7)
Eosinophils Relative: 1.6 % (ref 0.0–5.0)
HCT: 41.5 % (ref 36.0–46.0)
Hemoglobin: 13.9 g/dL (ref 12.0–15.0)
Lymphocytes Relative: 29.3 % (ref 12.0–46.0)
Lymphs Abs: 2.7 10*3/uL (ref 0.7–4.0)
MCHC: 33.4 g/dL (ref 30.0–36.0)
MCV: 92.3 fl (ref 78.0–100.0)
Monocytes Absolute: 0.6 10*3/uL (ref 0.1–1.0)
Monocytes Relative: 6.6 % (ref 3.0–12.0)
Neutro Abs: 5.7 10*3/uL (ref 1.4–7.7)
Neutrophils Relative %: 61.7 % (ref 43.0–77.0)
Platelets: 259 10*3/uL (ref 150.0–400.0)
RBC: 4.5 Mil/uL (ref 3.87–5.11)
RDW: 13.9 % (ref 11.5–15.5)
WBC: 9.2 10*3/uL (ref 4.0–10.5)

## 2022-11-24 LAB — LIPID PANEL
Cholesterol: 117 mg/dL (ref 0–200)
HDL: 44.1 mg/dL (ref >39.00)
LDL Cholesterol: 46 mg/dL (ref 0–99)
NonHDL: 72.49
Total CHOL/HDL Ratio: 3
Triglycerides: 132 mg/dL (ref 0.0–149.0)
VLDL: 26.4 mg/dL (ref 0.0–40.0)

## 2022-11-24 LAB — IRON,TIBC AND FERRITIN PANEL
%SAT: 28 % (calc) (ref 16–45)
Ferritin: 152 ng/mL (ref 16–288)
Iron: 92 ug/dL (ref 45–160)
TIBC: 333 mcg/dL (calc) (ref 250–450)

## 2022-11-24 LAB — LDL CHOLESTEROL, DIRECT: Direct LDL: 63 mg/dL

## 2022-11-24 LAB — TSH: TSH: 2.26 u[IU]/mL (ref 0.35–5.50)

## 2022-11-24 LAB — B12 AND FOLATE PANEL
Folate: >23.8 ng/mL (ref >5.9)
Vitamin B-12: 581 pg/mL (ref 211–911)

## 2022-11-24 LAB — HEMOGLOBIN A1C: Hgb A1c MFr Bld: 6.3 % (ref 4.6–6.5)

## 2022-11-24 NOTE — Assessment & Plan Note (Signed)
She remains well-controlled and is now tolerating Jardiance  b upplied via  [facility]  pharmaceutical assistance. . Patient is intolerant of  statin therapy for CAD risk reduction but is tolerating a PCSK9 (Praluent) and Is taking valsartan. Repeat labs are due   Lab Results  Component Value Date   HGBA1C 6.6 (H) 05/06/2022     Lab Results  Component Value Date   ALT 19 05/06/2022   AST 15 05/06/2022   ALKPHOS 44 05/06/2022   BILITOT 0.5 05/06/2022   Lab Results  Component Value Date   CHOL 135 05/06/2022   HDL 42.70 05/06/2022   LDLCALC 58 05/06/2022   LDLDIRECT 75.0 05/06/2022   TRIG 172.0 (H) 05/06/2022   CHOLHDL 3 05/06/2022

## 2022-11-24 NOTE — Assessment & Plan Note (Signed)
She has stable angina.  Continue Ranexa, Praluent. Plavix and ASA

## 2022-11-24 NOTE — Assessment & Plan Note (Signed)
Well controlled on current regimen of Imdur, metoprolol and valsartan .  Renal function is due , no changes today.  Lab Results  Component Value Date   CREATININE 0.94 05/06/2022   Lab Results  Component Value Date   NA 138 05/06/2022   K 4.3 05/06/2022   CL 99 05/06/2022   CO2 27 05/06/2022

## 2022-11-24 NOTE — Assessment & Plan Note (Signed)
history of statin myalgias.Patient is intolerant of  statin therapy for CAD risk reduction but it tolerating a PCSK9 (Praluent).  Repeat lipids are due   Lab Results  Component Value Date   ALT 19 05/06/2022   AST 15 05/06/2022   ALKPHOS 44 05/06/2022   BILITOT 0.5 05/06/2022   Lab Results  Component Value Date   CHOL 135 05/06/2022   HDL 42.70 05/06/2022   LDLCALC 58 05/06/2022   LDLDIRECT 75.0 05/06/2022   TRIG 172.0 (H) 05/06/2022   CHOLHDL 3 05/06/2022

## 2022-11-24 NOTE — Assessment & Plan Note (Signed)
Managed by Dr Raul Del.  She remains physically active and denies exertional dyspnea.  She has been advised to obtain  the RSV vaccine

## 2022-11-26 DIAGNOSIS — G4733 Obstructive sleep apnea (adult) (pediatric): Secondary | ICD-10-CM | POA: Diagnosis not present

## 2022-12-10 DIAGNOSIS — M542 Cervicalgia: Secondary | ICD-10-CM | POA: Diagnosis not present

## 2022-12-10 DIAGNOSIS — M5412 Radiculopathy, cervical region: Secondary | ICD-10-CM | POA: Diagnosis not present

## 2022-12-11 DIAGNOSIS — M5412 Radiculopathy, cervical region: Secondary | ICD-10-CM | POA: Diagnosis not present

## 2022-12-11 DIAGNOSIS — M542 Cervicalgia: Secondary | ICD-10-CM | POA: Diagnosis not present

## 2022-12-14 DIAGNOSIS — M5412 Radiculopathy, cervical region: Secondary | ICD-10-CM | POA: Diagnosis not present

## 2022-12-14 DIAGNOSIS — M542 Cervicalgia: Secondary | ICD-10-CM | POA: Diagnosis not present

## 2022-12-17 DIAGNOSIS — M542 Cervicalgia: Secondary | ICD-10-CM | POA: Diagnosis not present

## 2022-12-17 DIAGNOSIS — M5412 Radiculopathy, cervical region: Secondary | ICD-10-CM | POA: Diagnosis not present

## 2022-12-22 DIAGNOSIS — M542 Cervicalgia: Secondary | ICD-10-CM | POA: Diagnosis not present

## 2022-12-22 DIAGNOSIS — M5412 Radiculopathy, cervical region: Secondary | ICD-10-CM | POA: Diagnosis not present

## 2022-12-25 DIAGNOSIS — M5412 Radiculopathy, cervical region: Secondary | ICD-10-CM | POA: Diagnosis not present

## 2022-12-25 DIAGNOSIS — M542 Cervicalgia: Secondary | ICD-10-CM | POA: Diagnosis not present

## 2022-12-28 DIAGNOSIS — M542 Cervicalgia: Secondary | ICD-10-CM | POA: Diagnosis not present

## 2022-12-28 DIAGNOSIS — M5412 Radiculopathy, cervical region: Secondary | ICD-10-CM | POA: Diagnosis not present

## 2022-12-30 DIAGNOSIS — K080 Exfoliation of teeth due to systemic causes: Secondary | ICD-10-CM | POA: Diagnosis not present

## 2022-12-31 DIAGNOSIS — M5412 Radiculopathy, cervical region: Secondary | ICD-10-CM | POA: Diagnosis not present

## 2022-12-31 DIAGNOSIS — M542 Cervicalgia: Secondary | ICD-10-CM | POA: Diagnosis not present

## 2023-01-04 DIAGNOSIS — H524 Presbyopia: Secondary | ICD-10-CM | POA: Diagnosis not present

## 2023-01-04 DIAGNOSIS — E113393 Type 2 diabetes mellitus with moderate nonproliferative diabetic retinopathy without macular edema, bilateral: Secondary | ICD-10-CM | POA: Diagnosis not present

## 2023-01-05 DIAGNOSIS — M5412 Radiculopathy, cervical region: Secondary | ICD-10-CM | POA: Diagnosis not present

## 2023-01-05 DIAGNOSIS — M542 Cervicalgia: Secondary | ICD-10-CM | POA: Diagnosis not present

## 2023-01-12 DIAGNOSIS — M5412 Radiculopathy, cervical region: Secondary | ICD-10-CM | POA: Diagnosis not present

## 2023-01-12 DIAGNOSIS — M542 Cervicalgia: Secondary | ICD-10-CM | POA: Diagnosis not present

## 2023-01-19 DIAGNOSIS — G4733 Obstructive sleep apnea (adult) (pediatric): Secondary | ICD-10-CM | POA: Diagnosis not present

## 2023-01-19 DIAGNOSIS — I5042 Chronic combined systolic (congestive) and diastolic (congestive) heart failure: Secondary | ICD-10-CM | POA: Diagnosis not present

## 2023-01-19 DIAGNOSIS — R000 Tachycardia, unspecified: Secondary | ICD-10-CM | POA: Diagnosis not present

## 2023-01-19 DIAGNOSIS — M542 Cervicalgia: Secondary | ICD-10-CM | POA: Diagnosis not present

## 2023-01-19 DIAGNOSIS — I2510 Atherosclerotic heart disease of native coronary artery without angina pectoris: Secondary | ICD-10-CM | POA: Diagnosis not present

## 2023-01-19 DIAGNOSIS — R0609 Other forms of dyspnea: Secondary | ICD-10-CM | POA: Diagnosis not present

## 2023-01-19 DIAGNOSIS — R918 Other nonspecific abnormal finding of lung field: Secondary | ICD-10-CM | POA: Diagnosis not present

## 2023-01-19 DIAGNOSIS — Z955 Presence of coronary angioplasty implant and graft: Secondary | ICD-10-CM | POA: Diagnosis not present

## 2023-01-19 DIAGNOSIS — M5412 Radiculopathy, cervical region: Secondary | ICD-10-CM | POA: Diagnosis not present

## 2023-01-26 DIAGNOSIS — M542 Cervicalgia: Secondary | ICD-10-CM | POA: Diagnosis not present

## 2023-01-26 DIAGNOSIS — M5412 Radiculopathy, cervical region: Secondary | ICD-10-CM | POA: Diagnosis not present

## 2023-01-27 ENCOUNTER — Other Ambulatory Visit: Payer: Self-pay

## 2023-01-27 MED ORDER — EMPAGLIFLOZIN 10 MG PO TABS: 10.0000 mg | ORAL_TABLET | Freq: Every day | ORAL | 2 refills | Status: DC

## 2023-01-29 ENCOUNTER — Encounter: Payer: Self-pay | Admitting: Family Medicine

## 2023-01-29 ENCOUNTER — Encounter: Payer: Self-pay | Admitting: Internal Medicine

## 2023-01-29 ENCOUNTER — Telehealth (INDEPENDENT_AMBULATORY_CARE_PROVIDER_SITE_OTHER): Payer: Medicare Other | Admitting: Family Medicine

## 2023-01-29 ENCOUNTER — Telehealth: Payer: Self-pay

## 2023-01-29 VITALS — HR 85 | Temp 98.6°F

## 2023-01-29 DIAGNOSIS — J069 Acute upper respiratory infection, unspecified: Secondary | ICD-10-CM

## 2023-01-29 MED ORDER — FLUTICASONE PROPIONATE 50 MCG/ACT NA SUSP: 2.0000 | Freq: Every day | NASAL | 0 refills | Status: DC

## 2023-01-29 MED ORDER — EMPAGLIFLOZIN 10 MG PO TABS: 10.0000 mg | ORAL_TABLET | Freq: Every day | ORAL | 2 refills | Status: DC

## 2023-01-29 NOTE — Progress Notes (Signed)
Virtual Visit via video Note  This visit type was conducted due to national recommendations for restrictions regarding the COVID-19 pandemic (e.g. social distancing).  This format is felt to be most appropriate for this patient at this time.  All issues noted in this document were discussed and addressed.  No physical exam was performed (except for noted visual exam findings with Video Visits).   I connected with Stacie Mcdonald today at  3:15 PM EDT by a video enabled telemedicine application and verified that I am speaking with the correct person using two identifiers. Location patient: home Location provider: work Persons participating in the virtual visit: patient, provider  I discussed the limitations, risks, security and privacy concerns of performing an evaluation and management service by telephone and the availability of in person appointments. I also discussed with the patient that there may be a patient responsible charge related to this service. The patient expressed understanding and agreed to proceed.  Reason for visit: Respiratory illness  HPI: Patient presents with 3 days of symptoms.  She notes sneezing, rhinorrhea, nasal congestion with yellow mucus out of her nose, postnasal drip, and cough.  She notes Tmax 99.6 F.  No shortness of breath, loss of taste, or loss of smell.  She did have some sore throat though that has improved.  She notes no COVID exposure or flu exposure.  She did do a home COVID test this morning that was negative.  Allegra provides minimal benefit.  Benadryl provides no benefit.   ROS: See pertinent positives and negatives per HPI.  Past Medical History:  Diagnosis Date   Allergy    Arthritis    fingers, knees   Asthma    Breast Cancer 1991   R mastectomy. XRT , chemo   Breast cancer ([place]) 1991   RT MASTECTOMY   CAD (coronary artery disease) 2008   50% LAD occlusion 2009,  stent in mid LAD 2008 for 95%    Chicken pox    Colon polyps 02/2008    by Colonoscopy, [place]   GERD (gastroesophageal reflux disease)    Heart disease    Hyperlipidemia    Hypertension    Myocardial infarction ([place])    silent. 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    She successful ablation of the pseudoaneurysm   Rheumatic fever    SCC (squamous cell carcinoma), leg, right 01/09/2018   Sleep apnea    CPAP   Stable angina 07/27/2019   Thoracic vertebral fracture ([place]) 04/05/2012   Urinary tract infection    Wears contact lenses    left only    Past Surgical History:  Procedure Laterality Date   BREAST BIOPSY     right breast reconstruction S/P mastectomy   BREAST SURGERY     right mastectomy   CATARACT EXTRACTION W/PHACO Left 05/01/2020   Procedure: CATARACT EXTRACTION PHACO AND INTRAOCULAR LENS PLACEMENT (IOC) LEFT 5.45  00:48.6  11.2%;  Surgeon: Leandrew Koyanagi, MD;  Location: [facility];  Service: Ophthalmology;  Laterality: Left;  sleep apnea   COLONOSCOPY WITH PROPOFOL N/A 08/18/2019   Procedure: COLONOSCOPY WITH PROPOFOL;  Surgeon: [name], MD;  Location: [facility];  Service: Endoscopy;  Laterality: N/A;  sleep apnea   CORONARY ANGIOPLASTY WITH STENT PLACEMENT  Jan 2013   mid RCA 75% occlusion resolved to 0%, [place]   CORONARY STENT INTERVENTION N/A 04/07/2018   Procedure: CORONARY STENT  INTERVENTION;  Surgeon: Yolonda Kida, MD;  Location: [place] [facility];  Service: Cardiovascular;  Laterality: N/A;   CORONARY STENT INTERVENTION N/A 02/20/2021   Procedure: CORONARY STENT INTERVENTION;  Surgeon: Yolonda Kida, MD;  Location: [facility];  Service: Cardiovascular;  Laterality: N/A;   LEFT HEART CATH AND CORONARY ANGIOGRAPHY N/A 02/20/2021   Procedure: LEFT HEART CATH AND CORONARY ANGIOGRAPHY;  Surgeon: Yolonda Kida, MD;  Location: [facility];  Service: Cardiovascular;  Laterality: N/A;    MASTECTOMY Right 1991   BREAST CA   POLYPECTOMY  08/18/2019   Procedure: POLYPECTOMY;  Surgeon: [name], MD;  Location: [facility];  Service: Endoscopy;;   PSEUDOANERYSM COMPRESSION N/A 02/27/2021   Procedure: [place] COMPRESSION;  Surgeon: Katha Cabal, MD;  Location: [facility];  Service: Cardiovascular;  Laterality: N/A;   RIGHT/LEFT HEART CATH AND CORONARY ANGIOGRAPHY N/A 04/07/2018   Procedure: RIGHT/LEFT HEART CATH AND CORONARY ANGIOGRAPHY;  Surgeon: Yolonda Kida, MD;  Location: [facility];  Service: Cardiovascular;  Laterality: N/A;   TONSILLECTOMY      Family History  Problem Relation Age of Onset   Heart disease Mother 104       massive MI   Heart disease Father 31       AMI   Cancer Father        metastatic lung CA   Diabetes Sister    Cancer Maternal Aunt    Breast cancer Maternal Aunt 7   Breast cancer Cousin        46s   Breast cancer Cousin        56s    SOCIAL HX: Non-smoker   Current Outpatient Medications:    aspirin 81 MG tablet, Take 81 mg by mouth daily., Disp: , Rfl:    clopidogrel (PLAVIX) 75 MG tablet, TAKE ONE TABLET BY MOUTH DAILY (Patient taking differently: Take 75 mg by mouth daily.), Disp: 90 tablet, Rfl: 1   D-MANNOSE PO, Take 800 mg by mouth daily., Disp: , Rfl:    ELDERBERRY PO, Take 1,000 mg by mouth daily., Disp: , Rfl:    empagliflozin (JARDIANCE) 10 MG TABS tablet, Take 1 tablet (10 mg total) by mouth daily before breakfast., Disp: 90 tablet, Rfl: 2   fluticasone (FLONASE) 50 MCG/ACT nasal spray, Place 2 sprays into both nostrils daily., Disp: 16 g, Rfl: 0   furosemide (LASIX) 40 MG tablet, Take 40 mg by mouth daily., Disp: , Rfl:    GLUCOSAMINE-CHONDROITIN PO, Take 2 tablets by mouth daily., Disp: , Rfl:    isosorbide mononitrate (IMDUR) 120 MG 24 hr tablet, Take 120 mg by mouth daily. , Disp: , Rfl:    Lancets (FREESTYLE) lancets, uSE TO CHECK SUGARS ONCE DAILY e11.9, Disp: 100 each, Rfl:  3   loratadine (CLARITIN) 10 MG tablet, Take 10 mg by mouth daily as needed for allergies., Disp: , Rfl:    metoprolol (TOPROL-XL) 200 MG 24 hr tablet, TAKE 1 TABLET BY MOUTH DAILY. (Patient taking differently: Take 200 mg by mouth daily. TAKE 1 TABLET BY MOUTH DAILY.), Disp: 90 tablet, Rfl: 1   nitroGLYCERIN (NITROSTAT) 0.4 MG SL tablet, Place 0.4 mg under the tongue every 5 (five) minutes as needed for chest pain., Disp: , Rfl:    omeprazole (PRILOSEC) 20 MG capsule, Take 1 capsule (20 mg total) by mouth daily. (Patient taking differently: Take 20 mg by mouth daily as needed (acid reflux).), Disp: 90 capsule, Rfl: 1  ONE TOUCH ULTRA TEST test strip, CHECK DAILY, Disp: 100 each, Rfl: 2   OVER THE COUNTER MEDICATION, Take 50 mg by mouth daily. Blood sugar support with [place], Disp: , Rfl:    PRALUENT 75 MG/ML SOAJ, Inject 75 mg into the muscle every 14 (fourteen) days., Disp: , Rfl:    ranolazine (RANEXA) 500 MG 12 hr tablet, Take 500 mg by mouth 2 (two) times daily., Disp: , Rfl:    sacubitril-valsartan (ENTRESTO) 49-51 MG, Take 1 tablet by mouth 2 (two) times daily., Disp: , Rfl:    spironolactone (ALDACTONE) 25 MG tablet, Take 1 tablet (25 mg total) by mouth daily., Disp: 90 tablet, Rfl: 0   traZODone (DESYREL) 150 MG tablet, Take 1 tablet (150 mg total) by mouth at bedtime., Disp: 90 tablet, Rfl: 1  EXAM:  VITALS per patient if applicable:  GENERAL: alert, oriented, appears well and in no acute distress  HEENT: atraumatic, conjunttiva clear, no obvious abnormalities on inspection of external nose and ears  NECK: normal movements of the head and neck  LUNGS: on inspection no signs of respiratory distress, breathing rate appears normal, no obvious gross SOB, gasping or wheezing  CV: no obvious cyanosis  MS: moves all visible extremities without noticeable abnormality  PSYCH/NEURO: pleasant and cooperative, no obvious depression or anxiety, speech and thought processing grossly  intact  ASSESSMENT AND PLAN:  Discussed the following assessment and plan:  Problem List Items Addressed This Visit     Viral upper respiratory infection - Primary    I discussed with the patient that her symptoms were most consistent with viral upper respiratory infection.  Symptoms are not indicative of a bacterial cause of her symptoms.  Discussed supportive care.  Advised that she could switch the Allegra to Zyrtec or Claritin.  Discussed starting on Flonase.  She confirms no history of glaucoma.  Discussed Tylenol for any aches and pains.  She noted she could not take NSAIDs.  I advised if she feels as though this is moving into her chest she should contact us.  If she is not improving by Wednesday of next week she will contact us and we could consider an antibiotic at that point.      Relevant Medications   fluticasone (FLONASE) 50 MCG/ACT nasal spray    Return if symptoms worsen or fail to improve.   I discussed the assessment and treatment plan with the patient. The patient was provided an opportunity to ask questions and all were answered. The patient agreed with the plan and demonstrated an understanding of the instructions.   The patient was advised to call back or seek an in-person evaluation if the symptoms worsen or if the condition fails to improve as anticipated.   Tommi Rumps, MD

## 2023-01-29 NOTE — Telephone Encounter (Signed)
Minette Brine is calling from [employer] to follow-up on a fax she sent regarding a request for a 90-day fill for  empagliflozin (JARDIANCE) 10 MG TABS tablet to patient's pharmacy [facility] in [place]).  Minette Brine states she will send fax again just in case we are not able to locate it.

## 2023-01-29 NOTE — Assessment & Plan Note (Signed)
I discussed with the patient that her symptoms were most consistent with viral upper respiratory infection.  Symptoms are not indicative of a bacterial cause of her symptoms.  Discussed supportive care.  Advised that she could switch the Allegra to Zyrtec or Claritin.  Discussed starting on Flonase.  She confirms no history of glaucoma.  Discussed Tylenol for any aches and pains.  She noted she could not take NSAIDs.  I advised if she feels as though this is moving into her chest she should contact us.  If she is not improving by Wednesday of next week she will contact us and we could consider an antibiotic at that point.

## 2023-01-29 NOTE — Telephone Encounter (Signed)
Patient called office and has been schduled for a virtual visit today with Dr. Tommi Rumps.

## 2023-01-29 NOTE — Telephone Encounter (Signed)
Medication has been resent

## 2023-02-09 ENCOUNTER — Telehealth: Payer: Self-pay | Admitting: Internal Medicine

## 2023-02-09 NOTE — Telephone Encounter (Signed)
Copied from [place] [id number]. Topic: Medicare AWV >> Feb 09, 2023  2:55 PM Lollie Marrow wrote: Reason for CRM: Called patient to schedule Medicare Annual Wellness Visit (AWV). Left message for patient to call back and schedule Medicare Annual Wellness Visit (AWV).  Last date of AWV: 02/09/2022  Please schedule an AWVS appointment at any time with [facility].  If any questions, please contact me at [phone].    Thank you,  [place] Direct dial  [phone]

## 2023-02-17 ENCOUNTER — Other Ambulatory Visit: Payer: Self-pay

## 2023-02-17 MED ORDER — EMPAGLIFLOZIN 10 MG PO TABS: 10.0000 mg | ORAL_TABLET | Freq: Every day | ORAL | 2 refills | Status: DC

## 2023-02-24 DIAGNOSIS — G4733 Obstructive sleep apnea (adult) (pediatric): Secondary | ICD-10-CM | POA: Diagnosis not present

## 2023-03-05 ENCOUNTER — Other Ambulatory Visit: Payer: Self-pay | Admitting: Family Medicine

## 2023-03-05 ENCOUNTER — Other Ambulatory Visit: Payer: Self-pay | Admitting: Internal Medicine

## 2023-03-05 DIAGNOSIS — J069 Acute upper respiratory infection, unspecified: Secondary | ICD-10-CM

## 2023-03-11 ENCOUNTER — Encounter: Payer: Self-pay | Admitting: Internal Medicine

## 2023-03-11 NOTE — Telephone Encounter (Signed)
Would you like for me to put in a medication management referral.

## 2023-03-16 NOTE — Telephone Encounter (Signed)
Pt called stating she has the medication worked out and she will stay on jardiance

## 2023-03-16 NOTE — Telephone Encounter (Signed)
LMTCB

## 2023-03-19 NOTE — Telephone Encounter (Signed)
Pt does not want referral to medication management.

## 2023-04-02 ENCOUNTER — Telehealth: Payer: Self-pay | Admitting: Internal Medicine

## 2023-04-02 NOTE — Telephone Encounter (Signed)
Copied from CRM [id number]. Topic: Medicare AWV >> Apr 02, 2023 10:23 AM Payton Doughty wrote: Reason for CRM: Called patient to schedule Medicare Annual Wellness Visit (AWV). Left message for patient to call back and schedule Medicare Annual Wellness Visit (AWV).  Last date of AWV: 02/09/22  Please schedule an appointment at any time with Sydell Axon, CMA  .  If any questions, please contact me.  Thank you ,  Verlee Rossetti; Care Guide Ambulatory Clinical Support [place] [facility] Direct Dial: [phone]

## 2023-05-06 DIAGNOSIS — D225 Melanocytic nevi of trunk: Secondary | ICD-10-CM | POA: Diagnosis not present

## 2023-05-06 DIAGNOSIS — D2261 Melanocytic nevi of right upper limb, including shoulder: Secondary | ICD-10-CM | POA: Diagnosis not present

## 2023-05-06 DIAGNOSIS — Z85828 Personal history of other malignant neoplasm of skin: Secondary | ICD-10-CM | POA: Diagnosis not present

## 2023-05-06 DIAGNOSIS — D2262 Melanocytic nevi of left upper limb, including shoulder: Secondary | ICD-10-CM | POA: Diagnosis not present

## 2023-05-17 ENCOUNTER — Ambulatory Visit (INDEPENDENT_AMBULATORY_CARE_PROVIDER_SITE_OTHER): Payer: Medicare Other | Admitting: *Deleted

## 2023-05-17 VITALS — Ht 64.0 in | Wt 178.0 lb

## 2023-05-17 DIAGNOSIS — Z0000 Encounter for general adult medical examination without abnormal findings: Secondary | ICD-10-CM | POA: Diagnosis not present

## 2023-05-17 NOTE — Patient Instructions (Signed)
Stacie Mcdonald , Thank you for taking time to come for your Medicare Wellness Visit. I appreciate your ongoing commitment to your health goals. Please review the following plan we discussed and let me know if I can assist you in the future.   These are the goals we discussed:  Goals       Follow up with provider as needed      Patient Stated      none      Weight goal 165lb (pt-stated)      Stay active Healthy diet        This is a list of the screening recommended for you and due dates:  Health Maintenance  Topic Date Due   Eye exam for diabetics  10/16/2022   Yearly kidney health urinalysis for diabetes  05/07/2023   Hemoglobin A1C  05/24/2023   Flu Shot  06/17/2023   Yearly kidney function blood test for diabetes  11/24/2023   Complete foot exam   11/24/2023   Medicare Annual Wellness Visit  05/16/2024   Colon Cancer Screening  08/17/2024   Pneumonia Vaccine  Completed   DEXA scan (bone density measurement)  Completed   Hepatitis C Screening  Completed   Zoster (Shingles) Vaccine  Completed   HPV Vaccine  Aged Out   DTaP/Tdap/Td vaccine  Discontinued   COVID-19 Vaccine  Discontinued    Advanced directives: Will bring to the office  Conditions/risks identified: none  Next appointment: Follow up in one year for your annual wellness visit 05/18/23 @ 8:30   Preventive Care 65 Years and Older, Female Preventive care refers to lifestyle choices and visits with your health care provider that can promote health and wellness. What does preventive care include? A yearly physical exam. This is also called an annual well check. Dental exams once or twice a year. Routine eye exams. Ask your health care provider how often you should have your eyes checked. Personal lifestyle choices, including: Daily care of your teeth and gums. Regular physical activity. Eating a healthy diet. Avoiding tobacco and drug use. Limiting alcohol use. Practicing safe sex. Taking low-dose aspirin  every day. Taking vitamin and mineral supplements as recommended by your health care provider. What happens during an annual well check? The services and screenings done by your health care provider during your annual well check will depend on your age, overall health, lifestyle risk factors, and family history of disease. Counseling  Your health care provider may ask you questions about your: Alcohol use. Tobacco use. Drug use. Emotional well-being. Home and relationship well-being. Sexual activity. Eating habits. History of falls. Memory and ability to understand (cognition). Work and work Astronomer. Reproductive health. Screening  You may have the following tests or measurements: Height, weight, and BMI. Blood pressure. Lipid and cholesterol levels. These may be checked every 5 years, or more frequently if you are over 23 years old. Skin check. Lung cancer screening. You may have this screening every year starting at age 32 if you have a 30-pack-year history of smoking and currently smoke or have quit within the past 15 years. Fecal occult blood test (FOBT) of the stool. You may have this test every year starting at age 73. Flexible sigmoidoscopy or colonoscopy. You may have a sigmoidoscopy every 5 years or a colonoscopy every 10 years starting at age 69. Hepatitis C blood test. Hepatitis B blood test. Sexually transmitted disease (STD) testing. Diabetes screening. This is done by checking your blood sugar (glucose) after you have  not eaten for a while (fasting). You may have this done every 1-3 years. Bone density scan. This is done to screen for osteoporosis. You may have this done starting at age 9. Mammogram. This may be done every 1-2 years. Talk to your health care provider about how often you should have regular mammograms. Talk with your health care provider about your test results, treatment options, and if necessary, the need for more tests. Vaccines  Your health  care provider may recommend certain vaccines, such as: Influenza vaccine. This is recommended every year. Tetanus, diphtheria, and acellular pertussis (Tdap, Td) vaccine. You may need a Td booster every 10 years. Zoster vaccine. You may need this after age 38. Pneumococcal 13-valent conjugate (PCV13) vaccine. One dose is recommended after age 33. Pneumococcal polysaccharide (PPSV23) vaccine. One dose is recommended after age 3. Talk to your health care provider about which screenings and vaccines you need and how often you need them. This information is not intended to replace advice given to you by your health care provider. Make sure you discuss any questions you have with your health care provider. Document Released: 11/29/2015 Document Revised: 07/22/2016 Document Reviewed: 09/03/2015 Elsevier Interactive Patient Education  2017 [place] Prevention in the Home Falls can cause injuries. They can happen to people of all ages. There are many things you can do to make your home safe and to help prevent falls. What can I do on the outside of my home? Regularly fix the edges of walkways and driveways and fix any cracks. Remove anything that might make you trip as you walk through a door, such as a raised step or threshold. Trim any bushes or trees on the path to your home. Use bright outdoor lighting. Clear any walking paths of anything that might make someone trip, such as rocks or tools. Regularly check to see if handrails are loose or broken. Make sure that both sides of any steps have handrails. Any raised decks and porches should have guardrails on the edges. Have any leaves, snow, or ice cleared regularly. Use sand or salt on walking paths during winter. Clean up any spills in your garage right away. This includes oil or grease spills. What can I do in the bathroom? Use night lights. Install grab bars by the toilet and in the tub and shower. Do not use towel bars as grab  bars. Use non-skid mats or decals in the tub or shower. If you need to sit down in the shower, use a plastic, non-slip stool. Keep the floor dry. Clean up any water that spills on the floor as soon as it happens. Remove soap buildup in the tub or shower regularly. Attach bath mats securely with double-sided non-slip rug tape. Do not have throw rugs and other things on the floor that can make you trip. What can I do in the bedroom? Use night lights. Make sure that you have a light by your bed that is easy to reach. Do not use any sheets or blankets that are too big for your bed. They should not hang down onto the floor. Have a firm chair that has side arms. You can use this for support while you get dressed. Do not have throw rugs and other things on the floor that can make you trip. What can I do in the kitchen? Clean up any spills right away. Avoid walking on wet floors. Keep items that you use a lot in easy-to-reach places. If you need to  reach something above you, use a strong step stool that has a grab bar. Keep electrical cords out of the way. Do not use floor polish or wax that makes floors slippery. If you must use wax, use non-skid floor wax. Do not have throw rugs and other things on the floor that can make you trip. What can I do with my stairs? Do not leave any items on the stairs. Make sure that there are handrails on both sides of the stairs and use them. Fix handrails that are broken or loose. Make sure that handrails are as long as the stairways. Check any carpeting to make sure that it is firmly attached to the stairs. Fix any carpet that is loose or worn. Avoid having throw rugs at the top or bottom of the stairs. If you do have throw rugs, attach them to the floor with carpet tape. Make sure that you have a light switch at the top of the stairs and the bottom of the stairs. If you do not have them, ask someone to add them for you. What else can I do to help prevent  falls? Wear shoes that: Do not have high heels. Have rubber bottoms. Are comfortable and fit you well. Are closed at the toe. Do not wear sandals. If you use a stepladder: Make sure that it is fully opened. Do not climb a closed stepladder. Make sure that both sides of the stepladder are locked into place. Ask someone to hold it for you, if possible. Clearly mark and make sure that you can see: Any grab bars or handrails. First and last steps. Where the edge of each step is. Use tools that help you move around (mobility aids) if they are needed. These include: Canes. Walkers. Scooters. Crutches. Turn on the lights when you go into a dark area. Replace any light bulbs as soon as they burn out. Set up your furniture so you have a clear path. Avoid moving your furniture around. If any of your floors are uneven, fix them. If there are any pets around you, be aware of where they are. Review your medicines with your doctor. Some medicines can make you feel dizzy. This can increase your chance of falling. Ask your doctor what other things that you can do to help prevent falls. This information is not intended to replace advice given to you by your health care provider. Make sure you discuss any questions you have with your health care provider. Document Released: 08/29/2009 Document Revised: 04/09/2016 Document Reviewed: 12/07/2014 Elsevier Interactive Patient Education  2017 [employer].

## 2023-05-17 NOTE — Addendum Note (Signed)
Addended by: Sydell Axon C on: 05/17/2023 10:13 AM   Modules accepted: Level of Service

## 2023-05-17 NOTE — Progress Notes (Addendum)
Subjective:   Stacie Mcdonald is a 77 y.o. female who presents for Medicare Annual (Subsequent) preventive examination.  Visit Complete: Virtual  I connected with  Stacie Mcdonald on 05/17/23 by a audio enabled telemedicine application and verified that I am speaking with the correct person using two identifiers.  Patient Location: Home  Provider Location: Office/Clinic  I discussed the limitations of evaluation and management by telemedicine. The patient expressed understanding and agreed to proceed.  Patient Medicare AWV questionnaire was completed by the patient on 05/13/23; I have confirmed that all information answered by patient is correct and no changes since this date.  Review of Systems     Cardiac Risk Factors include: advanced age (>30men, >67 women);diabetes mellitus;dyslipidemia;obesity (BMI >30kg/m2);hypertension;Other (see comment), Risk factor comments: tachycardiac     Objective:    Today's Vitals   05/17/23 0826  Weight: 178 lb (80.7 kg)  Height: 5\' 4"  (1.626 m)   Body mass index is 30.55 kg/m.     05/17/2023    8:50 AM 02/09/2022   10:14 AM 02/27/2021    5:55 PM 02/20/2021    2:13 PM 07/30/2020   12:49 PM 05/01/2020    9:34 AM 08/18/2019    9:14 AM  Advanced Directives  Does Patient Have a Medical Advance Directive? Yes Yes Yes No Yes Yes Yes  Type of Estate agent of [place];Living will Healthcare Power of [place];Living will   Healthcare Power of [place];Living will Healthcare Power of [place];Living will Healthcare Power of [place];Living will  Does patient want to make changes to medical advance directive?  No - Patient declined No - Patient declined  No - Patient declined No - Patient declined No - Guardian declined  Copy of Healthcare Power of Attorney in Chart? No - copy requested No - copy requested   No - copy requested No - copy requested No - copy requested  Would patient like information on creating a medical advance  directive?    No - Patient declined       Current Medications (verified) Outpatient Encounter Medications as of 05/17/2023  Medication Sig   aspirin 81 MG tablet Take 81 mg by mouth daily.   clopidogrel (PLAVIX) 75 MG tablet TAKE ONE TABLET BY MOUTH DAILY (Patient taking differently: Take 75 mg by mouth daily.)   D-MANNOSE PO Take 800 mg by mouth daily.   ELDERBERRY PO Take 1,000 mg by mouth daily.   empagliflozin (JARDIANCE) 10 MG TABS tablet Take 1 tablet (10 mg total) by mouth daily before breakfast.   furosemide (LASIX) 40 MG tablet Take 40 mg by mouth daily.   GLUCOSAMINE-CHONDROITIN PO Take 2 tablets by mouth daily.   isosorbide mononitrate (IMDUR) 120 MG 24 hr tablet Take 120 mg by mouth daily.    Lancets (FREESTYLE) lancets uSE TO CHECK SUGARS ONCE DAILY e11.9   loratadine (CLARITIN) 10 MG tablet Take 10 mg by mouth daily as needed for allergies.   metoprolol (TOPROL-XL) 200 MG 24 hr tablet TAKE 1 TABLET BY MOUTH DAILY. (Patient taking differently: Take 200 mg by mouth daily. TAKE 1 TABLET BY MOUTH DAILY.)   nitroGLYCERIN (NITROSTAT) 0.4 MG SL tablet Place 0.4 mg under the tongue every 5 (five) minutes as needed for chest pain.   omeprazole (PRILOSEC) 20 MG capsule Take 1 capsule (20 mg total) by mouth daily. (Patient taking differently: Take 20 mg by mouth daily as needed (acid reflux).)   ONE TOUCH ULTRA TEST test strip CHECK DAILY  OVER THE COUNTER MEDICATION Take 50 mg by mouth daily. Blood sugar support with Pyerogel   PRALUENT 75 MG/ML SOAJ Inject 75 mg into the muscle every 14 (fourteen) days.   ranolazine (RANEXA) 500 MG 12 hr tablet Take 500 mg by mouth 2 (two) times daily.   sacubitril-valsartan (ENTRESTO) 49-51 MG Take 1 tablet by mouth 2 (two) times daily.   spironolactone (ALDACTONE) 25 MG tablet Take 1 tablet (25 mg total) by mouth daily.   traZODone (DESYREL) 150 MG tablet Take 1 tablet (150 mg total) by mouth at bedtime.   fluticasone (FLONASE) 50 MCG/ACT nasal spray  Place 2 sprays into both nostrils daily.   No facility-administered encounter medications on file as of 05/17/2023.    Allergies (verified) Penicillins and Statins   History: Past Medical History:  Diagnosis Date   Allergy    Arthritis    fingers, knees   Asthma    Breast Cancer 1991   R mastectomy. XRT , chemo   Breast cancer (HCC) 1991   RT MASTECTOMY   CAD (coronary artery disease) 2008   50% LAD occlusion 2009,  stent in mid LAD 2008 for 95%    Chicken pox    Colon polyps 02/2008   by Colonoscopy, [place]   GERD (gastroesophageal reflux disease)    Heart disease    Hyperlipidemia    Hypertension    Myocardial infarction (HCC)    silent. 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   Sleep apnea    CPAP   Stable angina 07/27/2019   Thoracic vertebral fracture (HCC) 04/05/2012   Urinary tract infection    Wears contact lenses    left only   Past Surgical History:  Procedure Laterality Date   BREAST BIOPSY     right breast reconstruction S/P mastectomy   BREAST SURGERY     right mastectomy   CATARACT EXTRACTION W/PHACO Left 05/01/2020   Procedure: CATARACT EXTRACTION PHACO AND INTRAOCULAR LENS PLACEMENT (IOC) LEFT 5.45  00:48.6  11.2%;  Surgeon: Lockie Mola, MD;  Location: [facility];  Service: Ophthalmology;  Laterality: Left;  sleep apnea   COLONOSCOPY WITH PROPOFOL N/A 08/18/2019   Procedure: COLONOSCOPY WITH PROPOFOL;  Surgeon: Midge Minium, MD;  Location: [facility];  Service: Endoscopy;  Laterality: N/A;  sleep apnea   CORONARY ANGIOPLASTY WITH STENT PLACEMENT  Jan 2013   mid RCA 75% occlusion resolved to 0%, [place]   CORONARY STENT INTERVENTION N/A 04/07/2018   Procedure: CORONARY STENT INTERVENTION;  Surgeon: Alwyn Pea, MD;   Location: [facility] IN[facility] CV [facility];  Service: Cardiovascular;  Laterality: N/A;   CORONARY STENT INTERVENTION N/A 02/20/2021   Procedure: CORONARY STENT INTERVENTION;  Surgeon: Alwyn Pea, MD;  Location: [facility];  Service: Cardiovascular;  Laterality: N/A;   LEFT HEART CATH AND CORONARY ANGIOGRAPHY N/A 02/20/2021   Procedure: LEFT HEART CATH AND CORONARY ANGIOGRAPHY;  Surgeon: Alwyn Pea, MD;  Location: [facility];  Service: Cardiovascular;  Laterality: N/A;   MASTECTOMY Right 1991   BREAST CA   POLYPECTOMY  08/18/2019   Procedure: POLYPECTOMY;  Surgeon: Midge Minium, MD;  Location[facility];  Service: Endoscopy;;   PSEUDOANERYSM COMPRESSION N/A 02/27/2021   Procedure: Chiquita Loth COMPRESSION;  Surgeon: Renford Dills, MD;  Location: [facility];  Service: Cardiovascular;  Laterality: N/A;   RIGHT/LEFT HEART CATH AND CORONARY ANGIOGRAPHY N/A 04/07/2018   Procedure: RIGHT/LEFT HEART CATH AND CORONARY ANGIOGRAPHY;  Surgeon: Alwyn Pea, MD;  Location: [facility];  Service: Cardiovascular;  Laterality: N/A;   TONSILLECTOMY     Family History  Problem Relation Age of Onset   Heart disease Mother 59       massive MI   Heart disease Father 66       AMI   Cancer Father        metastatic lung CA   Diabetes Sister    Cancer Maternal Aunt    Breast cancer Maternal Aunt 49   Breast cancer Cousin        37s   Breast cancer Cousin        91s   Social History   Socioeconomic History   Marital status: Married    Spouse name: Gabriel Carina    Number of children: 4   Years of education: bachelors   Highest education level: Not on file  Occupational History   Occupation: RETIRED  Tobacco Use   Smoking status: Never   Smokeless tobacco: Never  Vaping Use   Vaping Use: Never used  Substance and Sexual Activity   Alcohol use: Yes    Alcohol/week: 1.0 standard drink of alcohol    Types: 1 Glasses of wine per week     Comment: occasional wine   Drug use: No   Sexual activity: Yes  Other Topics Concern   Not on file  Social History Narrative   Not on file   Social Determinants of Health   Financial Resource Strain: Low Risk  (05/17/2023)   Overall Financial Resource Strain (CARDIA)    Difficulty of Paying Living Expenses: Not hard at all  Food Insecurity: No Food Insecurity (05/17/2023)   Hunger Vital Sign    Worried About Running Out of Food in the Last Year: Never true    Ran Out of Food in the Last Year: Never true  Transportation Needs: No Transportation Needs (05/17/2023)   PRAPARE - Administrator, Civil Service (Medical): No    Lack of Transportation (Non-Medical): No  Physical Activity: Insufficiently Active (05/17/2023)   Exercise Vital Sign    Days of Exercise per Week: 3 days    Minutes of Exercise per Session: 30 min  Stress: No Stress Concern Present (05/17/2023)   [employer] of Occupational Health - Occupational Stress Questionnaire    Feeling of Stress : Only a little  Social Connections: Socially Integrated (05/17/2023)   Social Connection and Isolation Panel [NHANES]    Frequency of Communication with Friends and Family: More than three times a week    Frequency of Social Gatherings with Friends and Family: More than three times a week    Attends Religious Services: More than 4 times per year    Active Member of [employer] or Organizations: Yes    Attends Engineer, structural: More than 4 times per year    Marital Status: Married    Tobacco Counseling Counseling given: Not Answered   Clinical Intake:  Pre-visit preparation completed: Yes  Pain : No/denies pain     BMI - recorded: 30.55 Nutritional Risks: None Diabetes: Yes (controlled by medicine and diet per patient) CBG done?: No Did pt. bring in CBG monitor from home?: No  How often do you need to have someone help you when you  read instructions, pamphlets, or other written materials from your  doctor or pharmacy?: 1 - Never  Interpreter Needed?: No  Information entered by :: R. Inara Dike LPN   Activities of Daily Living    05/17/2023    8:32 AM 05/13/2023    3:01 PM  In your present state of health, do you have any difficulty performing the following activities:  Hearing? 1 0  Comment in a large crowd   Vision? 1 0  Comment at a distance, has a cataract   Difficulty concentrating or making decisions? 0 0  Walking or climbing stairs? 1 1  Comment some difficulty climing stairs   Dressing or bathing? 0 0  Doing errands, shopping? 0 0  Preparing Food and eating ? N N  Using the Toilet? N N  In the past six months, have you accidently leaked urine? N N  Do you have problems with loss of bowel control? N N  Managing your Medications? N N  Managing your Finances? N N  Housekeeping or managing your Housekeeping? N N    Patient Care Team: Sherlene Shams, MD as PCP - General (Internal Medicine)  Indicate any recent Medical Services you may have received from other than [facility] providers in the past year (date may be approximate).     Assessment:   This is a routine wellness examination for Stacie Mcdonald.  Hearing/Vision screen Hearing Screening - Comments:: Not well,, may need hearing aids Vision Screening - Comments:: Problems at a distance,, has a cataract  Dietary issues and exercise activities discussed:     Goals Addressed             This Visit's Progress    Patient Stated       none      Depression Screen    05/17/2023    8:40 AM 01/29/2023    3:18 PM 11/23/2022    2:46 PM 05/06/2022    1:42 PM 02/09/2022   10:12 AM 11/04/2021    2:57 PM 07/30/2020   12:50 PM  PHQ 2/9 Scores  PHQ - 2 Score 0 0 0 0 0 0 0  PHQ- 9 Score 2 0         Fall Risk    05/13/2023    3:01 PM 01/29/2023    3:18 PM 11/23/2022    2:46 PM 05/06/2022    1:42 PM 02/09/2022   10:15 AM  Fall Risk   Falls in the past year? 0 0 0 0 0  Number falls in past yr: 0 0  0 0  Injury with Fall? 0 0  0    Risk for fall due to :  No Fall Risks No Fall Risks No Fall Risks   Follow up Falls evaluation completed;Falls prevention discussed Falls evaluation completed Falls evaluation completed Falls evaluation completed Falls evaluation completed    MEDICARE RISK AT HOME:    Cognitive Function:    06/09/2018   10:26 AM 06/08/2017   10:40 AM  MMSE - Mini Mental State Exam  Orientation to time 5 5  Orientation to Place 5 5  Registration 3 3  Attention/ Calculation 5 5  Recall 3 3  Language- name 2 objects 2 2  Language- repeat 1 1  Language- follow 3 step command 3 3  Language- read & follow direction 1 1  Write a sentence 1 1  Copy design 1 1  Total score 30 30        05/17/2023  8:51 AM 07/28/2019   10:23 AM  6CIT Screen  What Year? 0 points 0 points  What month? 0 points 0 points  What time? 0 points 0 points  Count back from 20 0 points 0 points  Months in reverse 0 points 0 points  Repeat phrase 0 points 0 points  Total Score 0 points 0 points    Immunizations Immunization History  Administered Date(s) Administered   Fluad Quad(high Dose 65+) 11/23/2022   Influenza Split 09/03/2013, 08/01/2014   Influenza, High Dose Seasonal PF 09/03/2017, 07/27/2018, 07/27/2019   Influenza-Unspecified 08/16/2012, 08/25/2021   Pneumococcal Conjugate-13 02/13/2015   Pneumococcal Polysaccharide-23 12/05/2009, 06/08/2017   Td 03/13/2010   Tdap 12/06/2003   Zoster Recombinant(Shingrix) 08/26/2018, 11/30/2018    TDAP status: Due, Education has been provided regarding the importance of this vaccine. Advised may receive this vaccine at local pharmacy or Health Dept. Aware to provide a copy of the vaccination record if obtained from local pharmacy or Health Dept. Verbalized acceptance and understanding.  Flu Vaccine status: Up to date  Pneumococcal vaccine status: Up to date  Covid-19 vaccine status: Declined, Education has been provided regarding the importance of this vaccine but  patient still declined. Advised may receive this vaccine at local pharmacy or Health Dept.or vaccine clinic. Aware to provide a copy of the vaccination record if obtained from local pharmacy or Health Dept. Verbalized acceptance and understanding.  Qualifies for Shingles Vaccine? Yes   Zostavax completed No   Shingrix Completed?: Yes  Screening Tests Health Maintenance  Topic Date Due   OPHTHALMOLOGY EXAM  10/16/2022   Diabetic kidney evaluation - Urine ACR  05/07/2023   HEMOGLOBIN A1C  05/24/2023   INFLUENZA VACCINE  06/17/2023   Diabetic kidney evaluation - eGFR measurement  11/24/2023   FOOT EXAM  11/24/2023   Medicare Annual Wellness (AWV)  05/16/2024   Colonoscopy  08/17/2024   Pneumonia Vaccine 31+ Years old  Completed   DEXA SCAN  Completed   Hepatitis C Screening  Completed   Zoster Vaccines- Shingrix  Completed   HPV VACCINES  Aged Out   DTaP/Tdap/Td  Discontinued   COVID-19 Vaccine  Discontinued    Health Maintenance  Health Maintenance Due  Topic Date Due   OPHTHALMOLOGY EXAM  10/16/2022   Diabetic kidney evaluation - Urine ACR  05/07/2023    Colorectal cancer screening: Type of screening: Colonoscopy. Completed 08/2019. Repeat every 5 years  Mammogram status: Completed 12/23. Repeat every year  Bone Density status: Completed 10/19. Results reflect: Bone density results: OSTEOPOROSIS. Repeat every 2 years.  Additional Screening:  Hepatitis C Screening: does qualify; Completed 4/16  Vision Screening: Recommended annual ophthalmology exams for early detection of glaucoma and other disorders of the eye. Is the patient up to date with their annual eye exam?  Yes  Who is the provider or what is the name of the office in which the patient attends annual eye exams? [facility], last appointment 2/24 If pt is not established with a provider, would they like to be referred to a provider to establish care? No .   Dental Screening: Recommended annual dental exams  for proper oral hygiene  Diabetic Foot Exam: Diabetic Foot Exam: Completed 11/23/22  Community Resource Referral / Chronic Care Management: CRR required this visit?  No   CCM required this visit?  No     Plan:     I have personally reviewed and noted the following in the patient's chart:   Medical and social  history Use of alcohol, tobacco or illicit drugs  Current medications and supplements including opioid prescriptions. Patient is not currently taking opioid prescriptions. Functional ability and status Nutritional status Physical activity Advanced directives List of other physicians Hospitalizations, surgeries, and ER visits in previous 12 months Vitals Screenings to include cognitive, depression, and falls Referrals and appointments  In addition, I have reviewed and discussed with patient certain preventive protocols, quality metrics, and best practice recommendations. A written personalized care plan for preventive services as well as general preventive health recommendations were provided to patient.     Sydell Axon, LPN   4/0/9811   After Visit Summary: (MyChart) Due to this being a telephonic visit, the after visit summary with patients personalized plan was offered to patient via MyChart   Nurse Notes: Patient had her last Bone Density test 08/2018. Patient wants to know if her PCP wants her to have another one and would like that scheduled.    I have reviewed the above information and agree with above.   Duncan Dull, MD

## 2023-05-18 ENCOUNTER — Other Ambulatory Visit: Payer: Self-pay

## 2023-05-18 MED ORDER — EMPAGLIFLOZIN 10 MG PO TABS: 10.0000 mg | ORAL_TABLET | Freq: Every day | ORAL | 2 refills | Status: DC

## 2023-05-24 ENCOUNTER — Ambulatory Visit: Payer: BLUE CROSS/BLUE SHIELD | Admitting: Internal Medicine

## 2023-05-25 DIAGNOSIS — E1151 Type 2 diabetes mellitus with diabetic peripheral angiopathy without gangrene: Secondary | ICD-10-CM | POA: Diagnosis not present

## 2023-05-25 DIAGNOSIS — I2510 Atherosclerotic heart disease of native coronary artery without angina pectoris: Secondary | ICD-10-CM | POA: Diagnosis not present

## 2023-05-25 DIAGNOSIS — K219 Gastro-esophageal reflux disease without esophagitis: Secondary | ICD-10-CM | POA: Diagnosis not present

## 2023-05-25 DIAGNOSIS — Z87440 Personal history of urinary (tract) infections: Secondary | ICD-10-CM | POA: Diagnosis not present

## 2023-05-25 DIAGNOSIS — E669 Obesity, unspecified: Secondary | ICD-10-CM | POA: Diagnosis not present

## 2023-05-25 DIAGNOSIS — Z95820 Peripheral vascular angioplasty status with implants and grafts: Secondary | ICD-10-CM | POA: Diagnosis not present

## 2023-05-25 DIAGNOSIS — G4733 Obstructive sleep apnea (adult) (pediatric): Secondary | ICD-10-CM | POA: Diagnosis not present

## 2023-05-25 DIAGNOSIS — Z5982 Transportation insecurity: Secondary | ICD-10-CM | POA: Diagnosis not present

## 2023-05-25 DIAGNOSIS — I252 Old myocardial infarction: Secondary | ICD-10-CM | POA: Diagnosis not present

## 2023-06-22 DIAGNOSIS — I10 Essential (primary) hypertension: Secondary | ICD-10-CM | POA: Diagnosis not present

## 2023-06-22 DIAGNOSIS — I2510 Atherosclerotic heart disease of native coronary artery without angina pectoris: Secondary | ICD-10-CM | POA: Diagnosis not present

## 2023-06-22 DIAGNOSIS — Z955 Presence of coronary angioplasty implant and graft: Secondary | ICD-10-CM | POA: Diagnosis not present

## 2023-06-22 DIAGNOSIS — E782 Mixed hyperlipidemia: Secondary | ICD-10-CM | POA: Diagnosis not present

## 2023-06-22 DIAGNOSIS — I5042 Chronic combined systolic (congestive) and diastolic (congestive) heart failure: Secondary | ICD-10-CM | POA: Diagnosis not present

## 2023-06-22 DIAGNOSIS — I209 Angina pectoris, unspecified: Secondary | ICD-10-CM | POA: Diagnosis not present

## 2023-06-25 ENCOUNTER — Encounter: Payer: Self-pay | Admitting: Internal Medicine

## 2023-06-25 ENCOUNTER — Encounter
Admission: RE | Disposition: A | Discharge status: Home / Self Care | Payer: Self-pay | Source: Home / Self Care | Attending: Internal Medicine

## 2023-06-25 ENCOUNTER — Other Ambulatory Visit: Payer: Self-pay

## 2023-06-25 ENCOUNTER — Observation Stay
Admission: RE | Admit: 2023-06-25 | Discharge: 2023-06-26 | Disposition: A | Discharge status: Home / Self Care | Payer: Medicare Other | Attending: Internal Medicine | Admitting: Internal Medicine

## 2023-06-25 DIAGNOSIS — T82855A Stenosis of coronary artery stent, initial encounter: Principal | ICD-10-CM | POA: Insufficient documentation

## 2023-06-25 DIAGNOSIS — E785 Hyperlipidemia, unspecified: Secondary | ICD-10-CM | POA: Insufficient documentation

## 2023-06-25 DIAGNOSIS — Z79899 Other long term (current) drug therapy: Secondary | ICD-10-CM | POA: Insufficient documentation

## 2023-06-25 DIAGNOSIS — Y831 Surgical operation with implant of artificial internal device as the cause of abnormal reaction of the patient, or of later complication, without mention of misadventure at the time of the procedure: Secondary | ICD-10-CM | POA: Insufficient documentation

## 2023-06-25 DIAGNOSIS — I25110 Atherosclerotic heart disease of native coronary artery with unstable angina pectoris: Secondary | ICD-10-CM | POA: Diagnosis not present

## 2023-06-25 DIAGNOSIS — Z853 Personal history of malignant neoplasm of breast: Secondary | ICD-10-CM | POA: Insufficient documentation

## 2023-06-25 DIAGNOSIS — Z7982 Long term (current) use of aspirin: Secondary | ICD-10-CM | POA: Diagnosis not present

## 2023-06-25 DIAGNOSIS — Z955 Presence of coronary angioplasty implant and graft: Secondary | ICD-10-CM | POA: Insufficient documentation

## 2023-06-25 DIAGNOSIS — Z7902 Long term (current) use of antithrombotics/antiplatelets: Secondary | ICD-10-CM | POA: Insufficient documentation

## 2023-06-25 DIAGNOSIS — J449 Chronic obstructive pulmonary disease, unspecified: Secondary | ICD-10-CM | POA: Diagnosis not present

## 2023-06-25 DIAGNOSIS — I5042 Chronic combined systolic (congestive) and diastolic (congestive) heart failure: Secondary | ICD-10-CM | POA: Insufficient documentation

## 2023-06-25 DIAGNOSIS — I10 Essential (primary) hypertension: Secondary | ICD-10-CM | POA: Diagnosis not present

## 2023-06-25 DIAGNOSIS — I110 Hypertensive heart disease with heart failure: Secondary | ICD-10-CM | POA: Insufficient documentation

## 2023-06-25 DIAGNOSIS — J45909 Unspecified asthma, uncomplicated: Secondary | ICD-10-CM | POA: Diagnosis not present

## 2023-06-25 DIAGNOSIS — Z9861 Coronary angioplasty status: Principal | ICD-10-CM

## 2023-06-25 DIAGNOSIS — I2510 Atherosclerotic heart disease of native coronary artery without angina pectoris: Secondary | ICD-10-CM

## 2023-06-25 HISTORY — PX: CORONARY STENT INTERVENTION: CATH118234

## 2023-06-25 HISTORY — PX: LEFT HEART CATH AND CORONARY ANGIOGRAPHY: CATH118249

## 2023-06-25 LAB — POCT ACTIVATED CLOTTING TIME: Activated Clotting Time: 354 seconds

## 2023-06-25 LAB — GLUCOSE, CAPILLARY: Glucose-Capillary: 104 mg/dL — ABNORMAL HIGH (ref 70–99)

## 2023-06-25 SURGERY — LEFT HEART CATH AND CORONARY ANGIOGRAPHY
Anesthesia: Moderate Sedation

## 2023-06-25 MED ORDER — RANOLAZINE ER 500 MG PO TB12
1000.0000 mg | ORAL_TABLET | Freq: Two times a day (BID) | ORAL | Status: DC
  Administered 2023-06-25 – 2023-06-26 (×2): 1000 mg via ORAL
  Filled 2023-06-25 (×2): qty 2

## 2023-06-25 MED ORDER — FENTANYL CITRATE (PF) 100 MCG/2ML IJ SOLN
INTRAMUSCULAR | Status: AC
  Filled 2023-06-25: qty 2

## 2023-06-25 MED ORDER — FENTANYL CITRATE (PF) 100 MCG/2ML IJ SOLN
INTRAMUSCULAR | Status: DC | PRN
  Administered 2023-06-25 (×2): 25 ug via INTRAVENOUS

## 2023-06-25 MED ORDER — BIVALIRUDIN BOLUS VIA INFUSION - CUPID
INTRAVENOUS | Status: DC | PRN
  Administered 2023-06-25: 60.9 mg via INTRAVENOUS

## 2023-06-25 MED ORDER — CLOPIDOGREL BISULFATE 75 MG PO TABS
ORAL_TABLET | ORAL | Status: DC | PRN
  Administered 2023-06-25: 300 mg via ORAL

## 2023-06-25 MED ORDER — BIVALIRUDIN TRIFLUOROACETATE 250 MG IV SOLR
INTRAVENOUS | Status: AC
  Filled 2023-06-25: qty 250

## 2023-06-25 MED ORDER — NITROGLYCERIN 0.4 MG SL SUBL
SUBLINGUAL_TABLET | SUBLINGUAL | Status: AC
  Filled 2023-06-25: qty 1

## 2023-06-25 MED ORDER — NITROGLYCERIN 0.4 MG SL SUBL
SUBLINGUAL_TABLET | SUBLINGUAL | Status: DC | PRN
  Administered 2023-06-25 (×2): .4 mg via SUBLINGUAL

## 2023-06-25 MED ORDER — ISOSORBIDE MONONITRATE ER 60 MG PO TB24
120.0000 mg | ORAL_TABLET | Freq: Every day | ORAL | Status: DC
  Administered 2023-06-26: 120 mg via ORAL
  Filled 2023-06-25: qty 2

## 2023-06-25 MED ORDER — LIDOCAINE HCL 1 % IJ SOLN
INTRAMUSCULAR | Status: AC
  Filled 2023-06-25: qty 20

## 2023-06-25 MED ORDER — SPIRONOLACTONE 25 MG PO TABS
25.0000 mg | ORAL_TABLET | Freq: Every day | ORAL | Status: DC
  Administered 2023-06-26: 25 mg via ORAL
  Filled 2023-06-25: qty 1

## 2023-06-25 MED ORDER — MIDAZOLAM HCL 2 MG/2ML IJ SOLN
INTRAMUSCULAR | Status: DC | PRN
  Administered 2023-06-25 (×2): 1 mg via INTRAVENOUS

## 2023-06-25 MED ORDER — SODIUM CHLORIDE 0.9 % WEIGHT BASED INFUSION: 1.0000 mL/kg/h | INTRAVENOUS | Status: AC

## 2023-06-25 MED ORDER — SODIUM CHLORIDE 0.9% FLUSH
3.0000 mL | Freq: Two times a day (BID) | INTRAVENOUS | Status: DC
  Administered 2023-06-26: 3 mL via INTRAVENOUS

## 2023-06-25 MED ORDER — PANTOPRAZOLE SODIUM 40 MG PO TBEC
40.0000 mg | DELAYED_RELEASE_TABLET | Freq: Every day | ORAL | Status: DC
  Administered 2023-06-25 – 2023-06-26 (×2): 40 mg via ORAL
  Filled 2023-06-25 (×2): qty 1

## 2023-06-25 MED ORDER — MIDAZOLAM HCL 2 MG/2ML IJ SOLN
INTRAMUSCULAR | Status: AC
  Filled 2023-06-25: qty 2

## 2023-06-25 MED ORDER — ACETAMINOPHEN 325 MG PO TABS
ORAL_TABLET | ORAL | Status: AC
  Filled 2023-06-25: qty 2

## 2023-06-25 MED ORDER — SODIUM CHLORIDE 0.9% FLUSH: 3.0000 mL | INTRAVENOUS | Status: DC | PRN

## 2023-06-25 MED ORDER — SODIUM CHLORIDE 0.9 % WEIGHT BASED INFUSION: 1.0000 mL/kg/h | INTRAVENOUS | Status: DC

## 2023-06-25 MED ORDER — HEPARIN SODIUM (PORCINE) 1000 UNIT/ML IJ SOLN
INTRAMUSCULAR | Status: AC
  Filled 2023-06-25: qty 10

## 2023-06-25 MED ORDER — LORATADINE 10 MG PO TABS: 10.0000 mg | ORAL_TABLET | Freq: Every day | ORAL | Status: DC | PRN

## 2023-06-25 MED ORDER — HYDRALAZINE HCL 20 MG/ML IJ SOLN: 10.0000 mg | INTRAMUSCULAR | Status: AC | PRN

## 2023-06-25 MED ORDER — IOHEXOL 300 MG/ML  SOLN
INTRAMUSCULAR | Status: DC | PRN
  Administered 2023-06-25: 250 mL

## 2023-06-25 MED ORDER — SODIUM CHLORIDE 0.9 % IV SOLN: 250.0000 mL | INTRAVENOUS | Status: DC | PRN

## 2023-06-25 MED ORDER — SACUBITRIL-VALSARTAN 49-51 MG PO TABS
1.0000 | ORAL_TABLET | Freq: Two times a day (BID) | ORAL | Status: DC
  Administered 2023-06-25 – 2023-06-26 (×2): 1 via ORAL
  Filled 2023-06-25 (×2): qty 1

## 2023-06-25 MED ORDER — LABETALOL HCL 5 MG/ML IV SOLN: 10.0000 mg | INTRAVENOUS | Status: AC | PRN

## 2023-06-25 MED ORDER — ACETAMINOPHEN 325 MG PO TABS
650.0000 mg | ORAL_TABLET | ORAL | Status: DC | PRN
  Administered 2023-06-25 (×2): 650 mg via ORAL
  Filled 2023-06-25: qty 2

## 2023-06-25 MED ORDER — LIDOCAINE HCL (PF) 1 % IJ SOLN
INTRAMUSCULAR | Status: DC | PRN
  Administered 2023-06-25: 20 mL

## 2023-06-25 MED ORDER — VERAPAMIL HCL 2.5 MG/ML IV SOLN
INTRAVENOUS | Status: AC
  Filled 2023-06-25: qty 2

## 2023-06-25 MED ORDER — ELDERBERRY 500 MG PO CAPS
1000.0000 mg | ORAL_CAPSULE | Freq: Every day | ORAL | Status: DC
Start: 2023-06-25 — End: 2023-06-25

## 2023-06-25 MED ORDER — GLUCOSAMINE-CHONDROITIN 500-400 MG PO TABS
2.0000 | ORAL_TABLET | Freq: Every day | ORAL | Status: DC
Start: 2023-06-25 — End: 2023-06-25

## 2023-06-25 MED ORDER — NITROGLYCERIN 0.4 MG SL SUBL: 0.4000 mg | SUBLINGUAL_TABLET | SUBLINGUAL | Status: DC | PRN

## 2023-06-25 MED ORDER — FUROSEMIDE 40 MG PO TABS
40.0000 mg | ORAL_TABLET | Freq: Every day | ORAL | Status: DC
  Administered 2023-06-25 – 2023-06-26 (×2): 40 mg via ORAL
  Filled 2023-06-25 (×2): qty 1

## 2023-06-25 MED ORDER — ONDANSETRON HCL 4 MG/2ML IJ SOLN: 4.0000 mg | Freq: Four times a day (QID) | INTRAMUSCULAR | Status: DC | PRN

## 2023-06-25 MED ORDER — SODIUM CHLORIDE 0.9 % IV SOLN
INTRAVENOUS | Status: DC | PRN
  Administered 2023-06-25: 1.75 mg/kg/h via INTRAVENOUS

## 2023-06-25 MED ORDER — HEPARIN (PORCINE) IN NACL 1000-0.9 UT/500ML-% IV SOLN
INTRAVENOUS | Status: DC | PRN
  Administered 2023-06-25 (×2): 500 mL

## 2023-06-25 MED ORDER — SODIUM CHLORIDE 0.9 % IV SOLN
INTRAVENOUS | Status: AC | PRN
  Administered 2023-06-25: 500 mL via INTRAVENOUS

## 2023-06-25 MED ORDER — D-MANNOSE 500 MG PO CAPS
800.0000 mg | ORAL_CAPSULE | Freq: Every day | ORAL | Status: DC
Start: 2023-06-25 — End: 2023-06-25

## 2023-06-25 MED ORDER — CLOPIDOGREL BISULFATE 75 MG PO TABS
75.0000 mg | ORAL_TABLET | Freq: Every day | ORAL | Status: DC
  Administered 2023-06-26: 75 mg via ORAL
  Filled 2023-06-25: qty 1

## 2023-06-25 MED ORDER — SODIUM CHLORIDE 0.9 % WEIGHT BASED INFUSION
3.0000 mL/kg/h | INTRAVENOUS | Status: AC
  Administered 2023-06-25: 3 mL/kg/h via INTRAVENOUS

## 2023-06-25 MED ORDER — CLOPIDOGREL BISULFATE 75 MG PO TABS
ORAL_TABLET | ORAL | Status: AC
  Filled 2023-06-25: qty 4

## 2023-06-25 MED ORDER — ASPIRIN 81 MG PO CHEW: 81.0000 mg | CHEWABLE_TABLET | ORAL | Status: DC

## 2023-06-25 MED ORDER — EMPAGLIFLOZIN 10 MG PO TABS
10.0000 mg | ORAL_TABLET | Freq: Every day | ORAL | Status: DC
  Administered 2023-06-26: 10 mg via ORAL
  Filled 2023-06-25: qty 1

## 2023-06-25 MED ORDER — ASPIRIN 81 MG PO CHEW
81.0000 mg | CHEWABLE_TABLET | Freq: Every day | ORAL | Status: DC
  Administered 2023-06-26: 81 mg via ORAL
  Filled 2023-06-25: qty 1

## 2023-06-25 MED ORDER — METOPROLOL SUCCINATE ER 100 MG PO TB24
200.0000 mg | ORAL_TABLET | Freq: Every day | ORAL | Status: DC
  Administered 2023-06-26: 200 mg via ORAL
  Filled 2023-06-25: qty 2

## 2023-06-25 MED ORDER — TRAZODONE HCL 50 MG PO TABS
150.0000 mg | ORAL_TABLET | Freq: Every day | ORAL | Status: DC
  Administered 2023-06-25: 150 mg via ORAL
  Filled 2023-06-25: qty 1

## 2023-06-25 SURGICAL SUPPLY — 25 items
BALLN MINITREK RX 1.5X12 (BALLOONS) ×2
BALLN MINITREK RX 2.0X15 (BALLOONS) ×2
BALLN ~~LOC~~ TREK NEO RX 3.0X15 (BALLOONS) ×2
BALLN ~~LOC~~ TREK NEO RX 3.5X12 (BALLOONS) IMPLANT
BALLOON MINITREK RX 1.5X12 (BALLOONS) IMPLANT
BALLOON MINITREK RX 2.0X15 (BALLOONS) IMPLANT
BALLOON ~~LOC~~ TREK NEO RX 3.0X15 (BALLOONS) IMPLANT
CATH AMP RT 5F (CATHETERS) IMPLANT
CATH INFINITI 5FR MULTPACK ANG (CATHETERS) IMPLANT
CATH VISTA GUIDE 6FR AR1 (CATHETERS) IMPLANT
DEVICE CLOSURE MYNXGRIP 6/7F (Vascular Products) IMPLANT
DRAPE BRACHIAL (DRAPES) IMPLANT
KIT ENCORE 26 ADVANTAGE (KITS) IMPLANT
NDL PERC 18GX7CM (NEEDLE) IMPLANT
NEEDLE PERC 18GX7CM (NEEDLE) ×2 IMPLANT
PACK CARDIAC CATH (CUSTOM PROCEDURE TRAY) ×2 IMPLANT
PROTECTION STATION PRESSURIZED (MISCELLANEOUS) ×2
SET ATX-X65L (MISCELLANEOUS) IMPLANT
SHEATH AVANTI 5FR X 11CM (SHEATH) IMPLANT
SHEATH AVANTI 6FR X 11CM (SHEATH) IMPLANT
STATION PROTECTION PRESSURIZED (MISCELLANEOUS) IMPLANT
TUBING CIL FLEX 10 FLL-RA (TUBING) IMPLANT
WIRE ASAHI GRAND SLAM 180CM (WIRE) IMPLANT
WIRE G HI TQ BMW 190 (WIRE) IMPLANT
WIRE GUIDERIGHT .035X150 (WIRE) IMPLANT

## 2023-06-25 NOTE — Consult Note (Signed)
PHARMACIST - PHYSICIAN ORDER COMMUNICATION  CONCERNING: P&T Medication Policy on Herbal Medications  DESCRIPTION:  This patient's order for:  D-mannose, elderberry, glucosamine-chondroitin  has been noted.  This product(s) is classified as an "herbal" or natural product. Due to a lack of definitive safety studies or FDA approval, nonstandard manufacturing practices, plus the potential risk of unknown drug-drug interactions while on inpatient medications, the Pharmacy and Therapeutics Committee does not permit the use of "herbal" or natural products of this type within [facility].   ACTION TAKEN: The pharmacy department is unable to verify this order at this time and your patient has been informed of this safety policy. Please reevaluate patient's clinical condition at discharge and address if the herbal or natural product(s) should be resumed at that time.

## 2023-06-26 DIAGNOSIS — Z853 Personal history of malignant neoplasm of breast: Secondary | ICD-10-CM | POA: Diagnosis not present

## 2023-06-26 DIAGNOSIS — Z9861 Coronary angioplasty status: Secondary | ICD-10-CM | POA: Diagnosis not present

## 2023-06-26 DIAGNOSIS — J45909 Unspecified asthma, uncomplicated: Secondary | ICD-10-CM | POA: Diagnosis not present

## 2023-06-26 DIAGNOSIS — Z955 Presence of coronary angioplasty implant and graft: Secondary | ICD-10-CM | POA: Diagnosis not present

## 2023-06-26 DIAGNOSIS — I5042 Chronic combined systolic (congestive) and diastolic (congestive) heart failure: Secondary | ICD-10-CM | POA: Diagnosis not present

## 2023-06-26 DIAGNOSIS — E785 Hyperlipidemia, unspecified: Secondary | ICD-10-CM | POA: Diagnosis not present

## 2023-06-26 DIAGNOSIS — I10 Essential (primary) hypertension: Secondary | ICD-10-CM | POA: Diagnosis not present

## 2023-06-26 DIAGNOSIS — Z7982 Long term (current) use of aspirin: Secondary | ICD-10-CM | POA: Diagnosis not present

## 2023-06-26 DIAGNOSIS — I110 Hypertensive heart disease with heart failure: Secondary | ICD-10-CM | POA: Diagnosis not present

## 2023-06-26 DIAGNOSIS — I25110 Atherosclerotic heart disease of native coronary artery with unstable angina pectoris: Secondary | ICD-10-CM | POA: Diagnosis not present

## 2023-06-26 DIAGNOSIS — T82855A Stenosis of coronary artery stent, initial encounter: Secondary | ICD-10-CM | POA: Diagnosis not present

## 2023-06-26 DIAGNOSIS — J449 Chronic obstructive pulmonary disease, unspecified: Secondary | ICD-10-CM | POA: Diagnosis not present

## 2023-06-26 DIAGNOSIS — Z7902 Long term (current) use of antithrombotics/antiplatelets: Secondary | ICD-10-CM | POA: Diagnosis not present

## 2023-06-26 DIAGNOSIS — Y831 Surgical operation with implant of artificial internal device as the cause of abnormal reaction of the patient, or of later complication, without mention of misadventure at the time of the procedure: Secondary | ICD-10-CM | POA: Diagnosis not present

## 2023-06-26 DIAGNOSIS — Z79899 Other long term (current) drug therapy: Secondary | ICD-10-CM | POA: Diagnosis not present

## 2023-06-26 NOTE — Progress Notes (Signed)
Patient ID: Stacie Mcdonald, female   DOB: 1946-04-18, 77 y.o.   MRN: [id number] [facility]    SUBJECTIVE: Patient states to be feeling really well no significant right groin issues patient feels ready for discharge no overnight anginal symptoms   Vitals:   06/25/23 2031 06/25/23 2334 06/26/23 0500 06/26/23 0739  BP: (!) 124/56 123/62 125/70 131/60  Pulse: 79 78 83 76  Resp: 20 18 18    Temp: 98.3 F (36.8 C) 98.5 F (36.9 C) 98.2 F (36.8 C) 98.4 F (36.9 C)  TempSrc: Oral  Oral Oral  SpO2: 96% 94% 93%   Weight:      Height:         Intake/Output Summary (Last 24 hours) at 06/26/2023 1149 Last data filed at 06/26/2023 0500 Gross per 24 hour  Intake 917.68 ml  Output --  Net 917.68 ml      PHYSICAL EXAM  General: Well developed, well nourished, in no acute distress HEENT:  Normocephalic and atramatic Neck:  No JVD.  Lungs: Clear bilaterally to auscultation and percussion. Heart: HRRR . Normal S1 and S2 without gallops or murmurs.  Abdomen: Bowel sounds are positive, abdomen soft and non-tender  Msk:  Back normal, normal gait. Normal strength and tone for age. Extremities: No clubbing, cyanosis or edema.   Neuro: Alert and oriented X 3. Psych:  Good affect, responds appropriately   LABS: Basic Metabolic Panel: Recent Labs    06/26/23 0647  NA 138  K 3.9  CL 110  CO2 21*  GLUCOSE 111*  BUN 15  CREATININE 0.76  CALCIUM 8.2*   Liver Function Tests: No results for input(s): "AST", "ALT", "ALKPHOS", "BILITOT", "PROT", "ALBUMIN" in the last 72 hours. No results for input(s): "LIPASE", "AMYLASE" in the last 72 hours. CBC: Recent Labs    06/26/23 0647  WBC 7.4  HGB 11.7*  HCT 34.9*  MCV 91.8  PLT 193   Cardiac Enzymes: No results for input(s): "CKTOTAL", "CKMB", "CKMBINDEX", "TROPONINI" in the last 72 hours. BNP: Invalid input(s): "POCBNP" D-Dimer: No results for input(s): "DDIMER" in the last 72 hours. Hemoglobin A1C: No results for input(s):  "HGBA1C" in the last 72 hours. Fasting Lipid Panel: No results for input(s): "CHOL", "HDL", "LDLCALC", "TRIG", "CHOLHDL", "LDLDIRECT" in the last 72 hours. Thyroid Function Tests: No results for input(s): "TSH", "T4TOTAL", "T3FREE", "THYROIDAB" in the last 72 hours.  Invalid input(s): "FREET3" Anemia Panel: No results for input(s): "VITAMINB12", "FOLATE", "FERRITIN", "TIBC", "IRON", "RETICCTPCT" in the last 72 hours.  CARDIAC CATHETERIZATION  Result Date: 06/25/2023   Mid LAD lesion is 50% stenosed.   Mid RCA-1 lesion is 90% stenosed.   Dist RCA lesion is 95% stenosed.   Non-stenotic Ost LAD to Prox LAD lesion was previously treated.   Non-stenotic Mid RCA-2 lesion was previously treated.   Balloon angioplasty was performed using a BALLN [place] TREK NEO RX 3.5X12.   Balloon angioplasty was performed using a BALLN [place] TREK NEO RX 3.0X15.   Post intervention, there is a 40% residual stenosis.   Post intervention, there is a 0% residual stenosis.   There is mild left ventricular systolic dysfunction.   The left ventricular ejection fraction is 50-55% by visual estimate.   There is no mitral valve regurgitation.   In the absence of any other complications or medical issues, we expect the patient to be ready for discharge from an interventional cardiology perspective on 06/26/2023.   Recommend dual antiplatelet therapy with Aspirin 81mg  daily and Clopidogrel 75mg  daily.  Conclusion Diagnostic cardiac cath because of unstable angina known coronary disease multiple PCI and stent in the past Right groin approach 5 French sheath placed in the right femoral artery under ultrasound guidance after 10 cc of 1% lidocaine was used anesthetize the area Left ventriculogram showed borderline low left ventricular function at around 50% Coronaries Left main mild irregularities LAD large mining likely disease widely patent mid coronary stent Circumflex large minor irregularities RCA large with extensive stent proximal to distal  evidence of in-stent restenosis in the mid segment focal 90% and distal RCA 95% prior to the bifurcation of PL and PDA focal Intervention Successful PCI of mid RCA and distal RCA Mid lesion reduced from 90 down to 40% Distal RCA reduced from 95 down to 0 TIMI-3 flow maintained throughout the procedure Patient tolerated procedure well Maintain on aspirin Plavix Mynx deployed after sheath removal Dissipate discharge within 24 hours     Echo previous echocardiogram ejection fraction around 50%  TELEMETRY: Normal sinus rhythm nonspecific T T wave changes rate of 80:  ASSESSMENT AND PLAN:  Principal Problem:   Status post coronary angioplasty Unstable angina Coronary artery disease Diabetes Hypertension Mild obesity Hyperlipidemia . Plan Status post PCI in-stent restenosis x 2 in RCA Continue aspirin Plavix statin therapy Pressure management and control with Entresto metoprolol Continue diabetes management control Recommend weight loss exercise portion control       Alwyn Pea, MD, 06/26/2023 11:49 AM

## 2023-06-28 ENCOUNTER — Telehealth: Payer: Self-pay

## 2023-06-28 ENCOUNTER — Encounter: Payer: Self-pay | Admitting: Internal Medicine

## 2023-06-28 DIAGNOSIS — E113393 Type 2 diabetes mellitus with moderate nonproliferative diabetic retinopathy without macular edema, bilateral: Secondary | ICD-10-CM | POA: Diagnosis not present

## 2023-06-28 NOTE — Transitions of Care (Post Inpatient/ED Visit) (Signed)
06/28/2023  Name: Stacie Mcdonald MRN: [id number] DOB: [birth year]  Today's TOC FU Call Status: Today's TOC FU Call Status:: Successful TOC FU Call Completed TOC FU Call Complete Date: 06/28/23  Transition Care Management Follow-up Telephone Call Date of Discharge: 06/26/23 Type of Discharge: Inpatient Admission How have you been since you were released from the hospital?: Better Any questions or concerns?: No  Items Reviewed: Did you receive and understand the discharge instructions provided?: Yes Any new allergies since your discharge?: No Dietary orders reviewed?: Yes Do you have support at home?: Yes People in Home: spouse  Medications Reviewed Today: Medications Reviewed Today     Reviewed by Karena Addison, LPN (Licensed Practical Nurse) on 06/28/23 at 1257  Med List Status: <None>   Medication Order Taking? Sig Documenting Provider Last Dose Status Informant  aspirin 81 MG tablet [id number] No Take 81 mg by mouth daily. [provider] 06/25/2023 Active Self  clopidogrel (PLAVIX) 75 MG tablet [id number] No TAKE ONE TABLET BY MOUTH DAILY  Patient taking differently: Take 75 mg by mouth daily.   Sherlene Shams, MD 06/25/2023 Active   D-MANNOSE PO [id number] No Take 800 mg by mouth daily. [provider] 06/24/2023 Active Self  ELDERBERRY PO [id number] No Take 1,000 mg by mouth daily. [provider] 06/24/2023 Active Self  empagliflozin (JARDIANCE) 10 MG TABS tablet [id number] No Take 1 tablet (10 mg total) by mouth daily before breakfast. Sherlene Shams, MD 06/25/2023 Active   furosemide (LASIX) 40 MG tablet [id number] No Take 40 mg by mouth daily. [provider] 06/24/2023 Active Self           Med Note Gunnar Fusi, MELISSA R   Tue Feb 18, 2021  2:37 PM)    GLUCOSAMINE-CHONDROITIN PO [id number] No Take 2 tablets by mouth daily. [provider] 06/25/2023 Active Self  isosorbide mononitrate (IMDUR) 120 MG 24 hr tablet [id number] No Take 120 mg by  mouth daily.  [provider] 06/25/2023 Active Self           Med Note Gunnar Fusi, MELISSA R   Tue Feb 18, 2021  2:38 PM) Pt currently taking 120 mg twice daily until procedure    loratadine (CLARITIN) 10 MG tablet [id number] No Take 10 mg by mouth daily as needed for allergies. [provider] 06/25/2023 Active Self  metoprolol (TOPROL-XL) 200 MG 24 hr tablet [id number] No TAKE 1 TABLET BY MOUTH DAILY.  Patient taking differently: Take 200 mg by mouth daily. TAKE 1 TABLET BY MOUTH DAILY.   Sherlene Shams, MD 06/25/2023 Active   nitroGLYCERIN (NITROSTAT) 0.4 MG SL tablet [id number] No Place 0.4 mg under the tongue every 5 (five) minutes as needed for chest pain. [provider] 06/25/2023 0 Active Self           Med Note Feliz Beam, CATHERINE E   Fri Jun 28, 2020  2:21 PM)    omeprazole (PRILOSEC) 20 MG capsule [id number] No Take 1 capsule (20 mg total) by mouth daily.  Patient taking differently: Take 20 mg by mouth daily as needed (acid reflux).   Sherlene Shams, MD Taking Active   OVER THE COUNTER MEDICATION [id number] No Take 50 mg by mouth daily. Blood sugar support with Pyerogel  Patient not taking: Reported on 06/25/2023   [provider] Not Taking Active Self  PRALUENT 75 MG/ML SOAJ [id number] No Inject 75 mg into the muscle every 14 (fourteen) days. [provider] Past Week Active Self  ranolazine (RANEXA)  500 MG 12 hr tablet [id number] No Take 500 mg by mouth 2 (two) times daily. [provider] 06/25/2023 Active Self           Med Note Gunnar Fusi, MELISSA R   Tue Feb 18, 2021  2:40 PM) Pt currently taking 1000 mg twice daily until procedure   sacubitril-valsartan (ENTRESTO) 49-51 MG [id number] No Take 1 tablet by mouth 2 (two) times daily. [provider] 06/25/2023 Active Self  spironolactone (ALDACTONE) 25 MG tablet [id number] No Take 1 tablet (25 mg total) by mouth daily. Sherlene Shams, MD 06/25/2023 Active Self  traZODone (DESYREL) 150 MG  tablet [id number] No Take 1 tablet (150 mg total) by mouth at bedtime. Sherlene Shams, MD 06/24/2023 Active             Home Care and Equipment/Supplies: Were Home Health Services Ordered?: NA Any new equipment or medical supplies ordered?: NA  Functional Questionnaire: Do you need assistance with bathing/showering or dressing?: No Do you need assistance with meal preparation?: No Do you need assistance with eating?: No Do you have difficulty maintaining continence: No Do you need assistance with getting out of bed/getting out of a chair/moving?: No Do you have difficulty managing or taking your medications?: No  Follow up appointments reviewed: PCP Follow-up appointment confirmed?: NA Specialist Hospital Follow-up appointment confirmed?: Yes Date of Specialist follow-up appointment?: 07/19/23 Follow-Up Specialty Provider:: cardio Do you need transportation to your follow-up appointment?: No Do you understand care options if your condition(s) worsen?: Yes-patient verbalized understanding    SIGNATURE Karena Addison, LPN [facility] Nurse Health Advisor Direct Dial [phone]

## 2023-06-29 LAB — LIPOPROTEIN A (LPA): Lipoprotein (a): 14.6 nmol/L (ref <75.0)

## 2023-07-05 NOTE — Discharge Summary (Signed)
Physician Discharge Summary      Patient ID: Sevana T [place] MRN: [id number] DOB/AGE: [birth year] 77 y.o.  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  Patient had significant improvement in symptoms if the stenosis once again occurs will refer the patient to a tertiary center for further evaluation medications are basically unchanged when she was ready to discharge and follow-up as an outpatient   Discharge Exam: Blood pressure (!) 104/51, pulse 78, temperature 98.3 F (36.8 C), resp. rate 18, height 5\' 4"  (1.626 m), weight 81.2 kg, SpO2 95%.   General appearance: appears stated age Neck: JVD - 1 cm above sternal notch, no adenopathy, no carotid bruit, no JVD, supple, symmetrical, trachea midline, and thyroid not enlarged, symmetric, no tenderness/mass/nodules Resp: clear to auscultation bilaterally Cardio: regular rate and rhythm, S1, S2 normal, no murmur, click, rub or gallop GI: soft, non-tender; bowel sounds normal; no masses,  no organomegaly Extremities: extremities normal, atraumatic, no cyanosis or edema Pulses: 2+ and symmetric Skin: Skin color, texture, turgor normal. No rashes or lesions Labs:   Lab Results  Component Value Date   WBC 7.4 06/26/2023   HGB 11.7 (L) 06/26/2023   HCT 34.9 (L) 06/26/2023   MCV 91.8 06/26/2023   PLT 193 06/26/2023   No results for input(s): "NA", "K", "CL",  "CO2", "BUN", "CREATININE", "CALCIUM", "PROT", "BILITOT", "ALKPHOS", "ALT", "AST", "GLUCOSE" in the last 168 hours.  Invalid input(s): "LABALBU"    Radiology:  EKG: Normal sinus rhythm nonspecific ST-T wave changes patient EF of 85  FOLLOW UP PLANS AND APPOINTMENTS Discharge Instructions     AMB Referral to Cardiac Rehabilitation - Phase II   Complete by: As directed    Diagnosis: Stable Angina   After initial evaluation and assessments completed: Virtual Based Care may be provided alone or in conjunction with Phase 2 Cardiac Rehab based on patient barriers.: Yes      Allergies as of 06/26/2023       Reactions   Penicillins Hives   Has patient had a PCN reaction causing immediate rash, facial/tongue/throat swelling, SOB or lightheadedness with hypotension: No Has patient had a PCN reaction causing severe rash involving mucus membranes or skin necrosis: Yes Has patient had a PCN reaction that required hospitalization: No Has patient had a PCN reaction occurring within the last 10 years: Yes If all of the above answers are "NO", then may proceed with Cephalosporin use.   Statins    Other reaction(s): Muscle Pain        Medication List     STOP taking these medications    fluticasone 50 MCG/ACT nasal spray Commonly known as: FLONASE       TAKE these medications    aspirin 81 MG tablet Take 81 mg by mouth daily.   clopidogrel 75 MG tablet Commonly known as: PLAVIX TAKE ONE TABLET BY MOUTH DAILY   D-MANNOSE PO Take 800 mg by mouth daily.   ELDERBERRY PO Take 1,000 mg by mouth daily.   empagliflozin 10 MG Tabs tablet Commonly  known as: Jardiance Take 1 tablet (10 mg total) by mouth daily before breakfast.   furosemide 40 MG tablet Commonly known as: LASIX Take 40 mg by mouth daily.   GLUCOSAMINE-CHONDROITIN PO Take 2 tablets by mouth daily.   isosorbide mononitrate 120 MG 24 hr tablet Commonly known as: IMDUR Take 120 mg by mouth daily.   loratadine  10 MG tablet Commonly known as: CLARITIN Take 10 mg by mouth daily as needed for allergies.   metoprolol 200 MG 24 hr tablet Commonly known as: TOPROL-XL TAKE 1 TABLET BY MOUTH DAILY. What changed:  how much to take how to take this when to take this   nitroGLYCERIN 0.4 MG SL tablet Commonly known as: NITROSTAT Place 0.4 mg under the tongue every 5 (five) minutes as needed for chest pain.   omeprazole 20 MG capsule Commonly known as: PRILOSEC Take 1 capsule (20 mg total) by mouth daily. What changed:  how much to take how to take this when to take this reasons to take this additional instructions   OVER THE COUNTER MEDICATION Take 50 mg by mouth daily. Blood sugar support with Pyerogel   Praluent 75 MG/ML Soaj Generic drug: Alirocumab Inject 75 mg into the muscle every 14 (fourteen) days.   ranolazine 500 MG 12 hr tablet Commonly known as: RANEXA Take 500 mg by mouth 2 (two) times daily.   sacubitril-valsartan 49-51 MG Commonly known as: ENTRESTO Take 1 tablet by mouth 2 (two) times daily.   spironolactone 25 MG tablet Commonly known as: Aldactone Take 1 tablet (25 mg total) by mouth daily.   traZODone 150 MG tablet Commonly known as: DESYREL Take 1 tablet (150 mg total) by mouth at bedtime.         BRING ALL MEDICATIONS WITH YOU TO FOLLOW UP APPOINTMENTS  Time spent with patient to include physician time: 30 minutes Signed:  Alwyn Pea MD, 07/05/2023, 8:49 AM

## 2023-07-07 DIAGNOSIS — K080 Exfoliation of teeth due to systemic causes: Secondary | ICD-10-CM | POA: Diagnosis not present

## 2023-07-13 ENCOUNTER — Encounter: Payer: Self-pay | Admitting: *Deleted

## 2023-07-13 ENCOUNTER — Encounter: Payer: Medicare Other | Attending: Internal Medicine | Admitting: *Deleted

## 2023-07-13 DIAGNOSIS — Z9861 Coronary angioplasty status: Secondary | ICD-10-CM | POA: Insufficient documentation

## 2023-07-13 DIAGNOSIS — Z48812 Encounter for surgical aftercare following surgery on the circulatory system: Secondary | ICD-10-CM | POA: Insufficient documentation

## 2023-07-13 NOTE — Progress Notes (Signed)
Virtual orientation call completed today. shehas an appointment on Date: 07/15/23  for EP eval and gym Orientation.  Documentation of diagnosis can be found in [facility] 06/25/2023 .

## 2023-07-15 ENCOUNTER — Encounter: Payer: Medicare Other | Admitting: *Deleted

## 2023-07-15 VITALS — Ht 63.5 in | Wt 181.1 lb

## 2023-07-15 DIAGNOSIS — Z9861 Coronary angioplasty status: Secondary | ICD-10-CM

## 2023-07-15 DIAGNOSIS — Z48812 Encounter for surgical aftercare following surgery on the circulatory system: Secondary | ICD-10-CM | POA: Diagnosis not present

## 2023-07-15 NOTE — Patient Instructions (Signed)
Patient Instructions  Patient Details  Name: Stacie Mcdonald MRN: [id number] Date of Birth: 1946-02-19 Referring Provider:  Sherlene Shams, MD  Below are your personal goals for exercise, nutrition, and risk factors. Our goal is to help you stay on track towards obtaining and maintaining these goals. We will be discussing your progress on these goals with you throughout the program.  Initial Exercise Prescription:  Initial Exercise Prescription - 07/15/23 1400       Date of Initial Exercise RX and Referring Provider   Date 07/15/23    Referring Provider Dr. Dorothyann Peng, MD      Oxygen   Maintain Oxygen Saturation 88% or higher      Treadmill   MPH 1.4    Grade 0    Minutes 15    METs 2.07      NuStep   Level 2    SPM 80    Minutes 15    METs 1.92      Biostep-RELP   Level 1    SPM 50    Minutes 15    METs 1.92      Prescription Details   Frequency (times per week) 3    Duration Progress to 30 minutes of continuous aerobic without signs/symptoms of physical distress      Intensity   THRR 40-80% of Max Heartrate 104-129    Ratings of Perceived Exertion 11-13    Perceived Dyspnea 0-4      Progression   Progression Continue to progress workloads to maintain intensity without signs/symptoms of physical distress.      Resistance Training   Training Prescription Yes    Weight 3 lb    Reps 10-15             Exercise Goals: Frequency: Be able to perform aerobic exercise two to three times per week in program working toward 2-5 days per week of home exercise.  Intensity: Work with a perceived exertion of 11 (fairly light) - 15 (hard) while following your exercise prescription.  We will make changes to your prescription with you as you progress through the program.   Duration: Be able to do 30 to 45 minutes of continuous aerobic exercise in addition to a 5 minute warm-up and a 5 minute cool-down routine.   Nutrition Goals: Your personal nutrition goals  will be established when you do your nutrition analysis with the dietician.  The following are general nutrition guidelines to follow: Cholesterol < 200mg /day Sodium < 1500mg /day Fiber: Women over 50 yrs - 21 grams per day  Personal Goals:  Personal Goals and Risk Factors at Admission - 07/13/23 1320       Core Components/Risk Factors/Patient Goals on Admission    Weight Management Yes;Weight Loss    Intervention Weight Management: Develop a combined nutrition and exercise program designed to reach desired caloric intake, while maintaining appropriate intake of nutrient and fiber, sodium and fats, and appropriate energy expenditure required for the weight goal.;Weight Management: Provide education and appropriate resources to help participant work on and attain dietary goals.    Admit Weight 179 lb (81.2 kg)    Goal Weight: Short Term 178 lb (80.7 kg)    Goal Weight: Long Term 169 lb (76.7 kg)    Expected Outcomes Short Term: Continue to assess and modify interventions until short term weight is achieved;Long Term: Adherence to nutrition and physical activity/exercise program aimed toward attainment of established weight goal    Improve shortness of breath with  ADL's Yes    Intervention Provide education, individualized exercise plan and daily activity instruction to help decrease symptoms of SOB with activities of daily living.    Expected Outcomes Short Term: Improve cardiorespiratory fitness to achieve a reduction of symptoms when performing ADLs;Long Term: Be able to perform more ADLs without symptoms or delay the onset of symptoms    Diabetes Yes   A1C every 6 months   diet controlled   Intervention Provide education about signs/symptoms and action to take for hypo/hyperglycemia.;Provide education about proper nutrition, including hydration, and aerobic/resistive exercise prescription along with prescribed medications to achieve blood glucose in normal ranges: Fasting glucose 65-99 mg/dL     Expected Outcomes Short Term: Participant verbalizes understanding of the signs/symptoms and immediate care of hyper/hypoglycemia, proper foot care and importance of medication, aerobic/resistive exercise and nutrition plan for blood glucose control.;Long Term: Attainment of HbA1C < 7%.    Heart Failure Yes    Intervention Provide a combined exercise and nutrition program that is supplemented with education, support and counseling about heart failure. Directed toward relieving symptoms such as shortness of breath, decreased exercise tolerance, and extremity edema.    Expected Outcomes Improve functional capacity of life;Short term: Attendance in program 2-3 days a week with increased exercise capacity. Reported lower sodium intake. Reported increased fruit and vegetable intake. Reports medication compliance.;Short term: Daily weights obtained and reported for increase. Utilizing diuretic protocols set by physician.;Long term: Adoption of self-care skills and reduction of barriers for early signs and symptoms recognition and intervention leading to self-care maintenance.    Hypertension Yes    Intervention Provide education on lifestyle modifcations including regular physical activity/exercise, weight management, moderate sodium restriction and increased consumption of fresh fruit, vegetables, and low fat dairy, alcohol moderation, and smoking cessation.;Monitor prescription use compliance.    Expected Outcomes Short Term: Continued assessment and intervention until BP is < 140/43mm HG in hypertensive participants. < 130/43mm HG in hypertensive participants with diabetes, heart failure or chronic kidney disease.;Long Term: Maintenance of blood pressure at goal levels.    Lipids Yes    Intervention Provide education and support for participant on nutrition & aerobic/resistive exercise along with prescribed medications to achieve LDL 70mg , HDL >40mg .    Expected Outcomes Short Term: Participant states  understanding of desired cholesterol values and is compliant with medications prescribed. Participant is following exercise prescription and nutrition guidelines.;Long Term: Cholesterol controlled with medications as prescribed, with individualized exercise RX and with personalized nutrition plan. Value goals: LDL < 70mg , HDL > 40 mg.            Exercise Goals and Review:  Exercise Goals     Row Name 07/15/23 1440             Exercise Goals   Increase Physical Activity Yes       Intervention Provide advice, education, support and counseling about physical activity/exercise needs.;Develop an individualized exercise prescription for aerobic and resistive training based on initial evaluation findings, risk stratification, comorbidities and participant's personal goals.       Expected Outcomes Short Term: Attend rehab on a regular basis to increase amount of physical activity.;Long Term: Add in home exercise to make exercise part of routine and to increase amount of physical activity.;Long Term: Exercising regularly at least 3-5 days a week.       Increase Strength and Stamina Yes       Intervention Provide advice, education, support and counseling about physical activity/exercise needs.;Develop an individualized exercise prescription  for aerobic and resistive training based on initial evaluation findings, risk stratification, comorbidities and participant's personal goals.       Expected Outcomes Short Term: Increase workloads from initial exercise prescription for resistance, speed, and METs.;Short Term: Perform resistance training exercises routinely during rehab and add in resistance training at home;Long Term: Improve cardiorespiratory fitness, muscular endurance and strength as measured by increased METs and functional capacity ( )       Able to understand and use rate of perceived exertion (RPE) scale Yes       Intervention Provide education and explanation on how to use RPE scale        Expected Outcomes Short Term: Able to use RPE daily in rehab to express subjective intensity level;Long Term:  Able to use RPE to guide intensity level when exercising independently       Able to understand and use Dyspnea scale Yes       Intervention Provide education and explanation on how to use Dyspnea scale       Expected Outcomes Short Term: Able to use Dyspnea scale daily in rehab to express subjective sense of shortness of breath during exertion;Long Term: Able to use Dyspnea scale to guide intensity level when exercising independently       Knowledge and understanding of Target Heart Rate Range (THRR) Yes       Intervention Provide education and explanation of THRR including how the numbers were predicted and where they are located for reference       Expected Outcomes Short Term: Able to state/look up THRR;Short Term: Able to use daily as guideline for intensity in rehab;Long Term: Able to use THRR to govern intensity when exercising independently       Able to check pulse independently Yes       Intervention Provide education and demonstration on how to check pulse in carotid and radial arteries.;Review the importance of being able to check your own pulse for safety during independent exercise       Expected Outcomes Short Term: Able to explain why pulse checking is important during independent exercise;Long Term: Able to check pulse independently and accurately       Understanding of Exercise Prescription Yes       Intervention Provide education, explanation, and written materials on patient's individual exercise prescription       Expected Outcomes Short Term: Able to explain program exercise prescription;Long Term: Able to explain home exercise prescription to exercise independently

## 2023-07-15 NOTE — Progress Notes (Signed)
Cardiac Individual Treatment Plan  Patient Details  Name: Stacie Mcdonald MRN: [id number] Date of Birth: [birth year] Referring Provider:   Flowsheet Row Cardiac Rehab from 07/15/2023 in [facility] Cardiac and Pulmonary Rehab  Referring Provider Dr. Dorothyann Peng, MD       Initial Encounter Date:  Flowsheet Row Cardiac Rehab from 07/15/2023 in [facility] Cardiac and Pulmonary Rehab  Date 07/15/23       Visit Diagnosis: S/P PTCA (percutaneous transluminal coronary angioplasty)  Patient's Home Medications on Admission:  Current Outpatient Medications:    albuterol (VENTOLIN HFA) 108 (90 Base) MCG/ACT inhaler, Inhale 2 puffs into the lungs every 6 (six) hours as needed. Using in Am daily  per MD, Disp: , Rfl:    aspirin 81 MG tablet, Take 81 mg by mouth daily., Disp: , Rfl:    clopidogrel (PLAVIX) 75 MG tablet, TAKE ONE TABLET BY MOUTH DAILY (Patient taking differently: Take 75 mg by mouth daily.), Disp: 90 tablet, Rfl: 1   D-MANNOSE PO, Take 800 mg by mouth daily., Disp: , Rfl:    ELDERBERRY PO, Take 1,000 mg by mouth daily., Disp: , Rfl:    empagliflozin (JARDIANCE) 10 MG TABS tablet, Take 1 tablet (10 mg total) by mouth daily before breakfast., Disp: 90 tablet, Rfl: 2   furosemide (LASIX) 40 MG tablet, Take 40 mg by mouth daily., Disp: , Rfl:    GLUCOSAMINE-CHONDROITIN PO, Take 2 tablets by mouth daily., Disp: , Rfl:    isosorbide mononitrate (IMDUR) 120 MG 24 hr tablet, Take 120 mg by mouth daily. , Disp: , Rfl:    loratadine (CLARITIN) 10 MG tablet, Take 10 mg by mouth daily as needed for allergies., Disp: , Rfl:    metoprolol (TOPROL-XL) 200 MG 24 hr tablet, TAKE 1 TABLET BY MOUTH DAILY. (Patient taking differently: Take 200 mg by mouth daily. TAKE 1 TABLET BY MOUTH DAILY.), Disp: 90 tablet, Rfl: 1   nitroGLYCERIN (NITROSTAT) 0.4 MG SL tablet, Place 0.4 mg under the tongue every 5 (five) minutes as needed for chest pain., Disp: , Rfl:    omeprazole (PRILOSEC) 20 MG capsule, Take 1 capsule (20  mg total) by mouth daily. (Patient taking differently: Take 20 mg by mouth daily as needed (acid reflux).), Disp: 90 capsule, Rfl: 1   OVER THE COUNTER MEDICATION, Take 50 mg by mouth daily. Blood sugar support with Pyerogel (Patient not taking: Reported on 06/25/2023), Disp: , Rfl:    PRALUENT 75 MG/ML SOAJ, Inject 75 mg into the muscle every 14 (fourteen) days., Disp: , Rfl:    ranolazine (RANEXA) 500 MG 12 hr tablet, Take 500 mg by mouth 2 (two) times daily., Disp: , Rfl:    sacubitril-valsartan (ENTRESTO) 49-51 MG, Take 1 tablet by mouth 2 (two) times daily., Disp: , Rfl:    spironolactone (ALDACTONE) 25 MG tablet, Take 1 tablet (25 mg total) by mouth daily., Disp: 90 tablet, Rfl: 0   traZODone (DESYREL) 150 MG tablet, Take 1 tablet (150 mg total) by mouth at bedtime., Disp: 90 tablet, Rfl: 0  Past Medical History: Past Medical History:  Diagnosis Date   Allergy    Arthritis    fingers, knees   Asthma    Breast Cancer 1991   R mastectomy. 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   Sleep apnea    CPAP   Stable angina 07/27/2019   Thoracic vertebral fracture (HCC) 04/05/2012   Urinary tract infection    Wears contact lenses    left only    Tobacco Use: Social History   Tobacco Use  Smoking Status Never  Smokeless Tobacco Never    Labs: Review Flowsheet  More data exists      Latest Ref Rng & Units 08/02/2019 03/31/2021  11/04/2021 05/06/2022 11/23/2022  Labs for ITP Cardiac and Pulmonary Rehab  Cholestrol 0 - 200 mg/dL - 093  235  573  220   LDL (calc) 0 - 99 mg/dL - 44  38  58  46   Direct LDL mg/dL - - - 25.4  27.0   HDL-C >39.00 mg/dL - 62.37  62.83  15.17  44.10   Trlycerides 0.0 - 149.0 mg/dL - 616.0  737.1  062.6  132.0   Hemoglobin A1c 4.6 - 6.5 % 6.4  6.3  6.5  6.6  6.3     Details             Exercise Target Goals: Exercise Program Goal: Individual exercise prescription set using results from initial 6 min walk test and THRR while considering  patient's activity barriers and safety.   Exercise Prescription Goal: Initial exercise prescription builds to 30-45 minutes a day of aerobic activity, 2-3 days per week.  Home exercise guidelines will be given to patient during program as part of exercise prescription that the participant will acknowledge.   Education: Aerobic Exercise: - Group verbal and visual presentation on the components of exercise prescription. Introduces F.I.T.T principle from ACSM for exercise prescriptions.  Reviews F.I.T.T. principles of aerobic exercise including progression. Written material given at graduation. Flowsheet Row Cardiac Rehab from 08/18/2018 in [facility] Cardiac and Pulmonary Rehab  Date 08/18/18  Educator [facility]  Instruction Review Code 5- Refused Teaching       Education: Resistance Exercise: - Group verbal and visual presentation on the components of exercise prescription. Introduces F.I.T.T principle from ACSM for exercise prescriptions  Reviews F.I.T.T. principles of resistance exercise including progression. Written material given at graduation.    Education: Exercise & Equipment Safety: - Individual verbal instruction and demonstration of equipment use and safety with use of the equipment. Flowsheet Row Cardiac Rehab from 07/15/2023 in [facility] Cardiac and Pulmonary Rehab  Date 07/15/23  Educator NT  Instruction Review Code 1- Verbalizes Understanding        Education: Exercise Physiology & General Exercise Guidelines: - Group verbal and written instruction with models to review the exercise physiology of the cardiovascular system and associated critical values. Provides general exercise guidelines with specific guidelines to those with heart or lung disease.  Flowsheet Row Cardiac Rehab from 08/18/2018 in [facility] Cardiac and Pulmonary Rehab  Date 08/11/18  Educator [facility]  Instruction Review Code 1- Verbalizes Understanding       Education: Flexibility, Balance, Mind/Body Relaxation: - Group verbal and visual presentation with interactive activity on the components of exercise prescription. Introduces F.I.T.T principle from ACSM  for exercise prescriptions. Reviews F.I.T.T. principles of flexibility and balance exercise training including progression. Also discusses the mind body connection.  Reviews various relaxation techniques to help reduce and manage stress (i.e. Deep breathing, progressive muscle relaxation, and visualization). Balance handout provided to take home. Written material given at graduation. Flowsheet Row Cardiac Rehab from 08/18/2018 in [facility] Cardiac and Pulmonary Rehab  Date 06/28/18  Educator AS  Instruction Review Code 5- Refused Teaching       Activity Barriers & Risk Stratification:  Activity Barriers & Cardiac Risk Stratification - 07/15/23 1439       Activity Barriers & Cardiac Risk Stratification   Activity Barriers Shortness of Breath;Chest Pain/Angina;Deconditioning;Muscular Weakness    Cardiac Risk Stratification High             6 Minute Walk:  6 Minute Walk     Row Name 07/15/23 1438         6 Minute Walk   Phase Initial     Distance 1055 feet     Walk Time 6 minutes     # of Rest Breaks 0     MPH 1.99     METS 1.92     RPE 15     Perceived Dyspnea  2     VO2 Peak 6.72     Symptoms Yes (comment)     Comments SOB, leg weakness     Resting HR 80 bpm     Resting BP 124/68     Resting Oxygen  Saturation  93 %     Exercise Oxygen Saturation  during 6 min walk 93 %     Max Ex. HR 108 bpm     Max Ex. BP 142/74     2 Minute Post BP 120/68              Oxygen Initial Assessment:   Oxygen Re-Evaluation:   Oxygen Discharge (Final Oxygen Re-Evaluation):   Initial Exercise Prescription:  Initial Exercise Prescription - 07/15/23 1400       Date of Initial Exercise RX and Referring Provider   Date 07/15/23    Referring Provider Dr. Dorothyann Peng, MD      Oxygen   Maintain Oxygen Saturation 88% or higher      Treadmill   MPH 1.4    Grade 0    Minutes 15    METs 2.07      NuStep   Level 2    SPM 80    Minutes 15    METs 1.92      Biostep-RELP   Level 1    SPM 50    Minutes 15    METs 1.92      Prescription Details   Frequency (times per week) 3    Duration Progress to 30 minutes of continuous aerobic without signs/symptoms of physical distress      Intensity   THRR 40-80% of Max Heartrate 104-129    Ratings of Perceived Exertion 11-13    Perceived Dyspnea 0-4      Progression   Progression Continue to progress workloads to maintain intensity without signs/symptoms of physical distress.      Resistance Training   Training Prescription Yes    Weight 3 lb    Reps 10-15             Perform Capillary Blood Glucose checks as needed.  Exercise Prescription Changes:   Exercise Prescription Changes     Row Name 07/15/23 1400  Response to Exercise   Blood Pressure (Admit) 124/68       Blood Pressure (Exercise) 142/74       Blood Pressure (Exit) 120/68       Heart Rate (Admit) 80 bpm       Heart Rate (Exercise) 108 bpm       Heart Rate (Exit) 87 bpm       Oxygen Saturation (Admit) 93 %       Oxygen Saturation (Exercise) 93 %       Rating of Perceived Exertion (Exercise) 15       Perceived Dyspnea (Exercise) 2       Symptoms SOB, leg weakness       Comments Results                Exercise  Comments:   Exercise Goals and Review:   Exercise Goals     Row Name 07/15/23 1440             Exercise Goals   Increase Physical Activity Yes       Intervention Provide advice, education, support and counseling about physical activity/exercise needs.;Develop an individualized exercise prescription for aerobic and resistive training based on initial evaluation findings, risk stratification, comorbidities and participant's personal goals.       Expected Outcomes Short Term: Attend rehab on a regular basis to increase amount of physical activity.;Long Term: Add in home exercise to make exercise part of routine and to increase amount of physical activity.;Long Term: Exercising regularly at least 3-5 days a week.       Increase Strength and Stamina Yes       Intervention Provide advice, education, support and counseling about physical activity/exercise needs.;Develop an individualized exercise prescription for aerobic and resistive training based on initial evaluation findings, risk stratification, comorbidities and participant's personal goals.       Expected Outcomes Short Term: Increase workloads from initial exercise prescription for resistance, speed, and METs.;Short Term: Perform resistance training exercises routinely during rehab and add in resistance training at home;Long Term: Improve cardiorespiratory fitness, muscular endurance and strength as measured by increased METs and functional capacity ( )       Able to understand and use rate of perceived exertion (RPE) scale Yes       Intervention Provide education and explanation on how to use RPE scale       Expected Outcomes Short Term: Able to use RPE daily in rehab to express subjective intensity level;Long Term:  Able to use RPE to guide intensity level when exercising independently       Able to understand and use Dyspnea scale Yes       Intervention Provide education and explanation on how to use Dyspnea scale       Expected  Outcomes Short Term: Able to use Dyspnea scale daily in rehab to express subjective sense of shortness of breath during exertion;Long Term: Able to use Dyspnea scale to guide intensity level when exercising independently       Knowledge and understanding of Target Heart Rate Range (THRR) Yes       Intervention Provide education and explanation of THRR including how the numbers were predicted and where they are located for reference       Expected Outcomes Short Term: Able to state/look up THRR;Short Term: Able to use daily as guideline for intensity in rehab;Long Term: Able to use THRR to govern intensity when exercising independently       Able to  check pulse independently Yes       Intervention Provide education and demonstration on how to check pulse in carotid and radial arteries.;Review the importance of being able to check your own pulse for safety during independent exercise       Expected Outcomes Short Term: Able to explain why pulse checking is important during independent exercise;Long Term: Able to check pulse independently and accurately       Understanding of Exercise Prescription Yes       Intervention Provide education, explanation, and written materials on patient's individual exercise prescription       Expected Outcomes Short Term: Able to explain program exercise prescription;Long Term: Able to explain home exercise prescription to exercise independently                Exercise Goals Re-Evaluation :   Discharge Exercise Prescription (Final Exercise Prescription Changes):  Exercise Prescription Changes - 07/15/23 1400       Response to Exercise   Blood Pressure (Admit) 124/68    Blood Pressure (Exercise) 142/74    Blood Pressure (Exit) 120/68    Heart Rate (Admit) 80 bpm    Heart Rate (Exercise) 108 bpm    Heart Rate (Exit) 87 bpm    Oxygen Saturation (Admit) 93 %    Oxygen Saturation (Exercise) 93 %    Rating of Perceived Exertion (Exercise) 15    Perceived  Dyspnea (Exercise) 2    Symptoms SOB, leg weakness    Comments Results             Nutrition:  Target Goals: Understanding of nutrition guidelines, daily intake of sodium 1500mg , cholesterol 200mg , calories 30% from fat and 7% or less from saturated fats, daily to have 5 or more servings of fruits and vegetables.  Education: All About Nutrition: -Group instruction provided by verbal, written material, interactive activities, discussions, models, and posters to present general guidelines for heart healthy nutrition including fat, fiber, MyPlate, the role of sodium in heart healthy nutrition, utilization of the nutrition label, and utilization of this knowledge for meal planning. Follow up email sent as well. Written material given at graduation. Flowsheet Row Cardiac Rehab from 08/18/2018 in [facility] Cardiac and Pulmonary Rehab  Date 05/31/18  Educator CR  Instruction Review Code 1- Verbalizes Understanding       Biometrics:  Pre Biometrics - 07/15/23 1440       Pre Biometrics   Height 5' 3.5" (1.613 m)    Weight 181 lb 1.6 oz (82.1 kg)    Waist Circumference 40 inches    Hip Circumference 42.5 inches    Waist to Hip Ratio 0.94 %    BMI (Calculated) 31.57    Single Leg Stand 8.3 seconds              Nutrition Therapy Plan and Nutrition Goals:   Nutrition Assessments:  MEDIFICTS Score Key: ?70 Need to make dietary changes  40-70 Heart Healthy Diet ? 40 Therapeutic Level Cholesterol Diet   Picture Your Plate Scores: <82 Unhealthy dietary pattern with much room for improvement. 41-50 Dietary pattern unlikely to meet recommendations for good health and room for improvement. 51-60 More healthful dietary pattern, with some room for improvement.  >60 Healthy dietary pattern, although there may be some specific behaviors that could be improved.    Nutrition Goals Re-Evaluation:   Nutrition Goals Discharge (Final Nutrition Goals  Re-Evaluation):   Psychosocial: Target Goals: Acknowledge presence or absence of significant depression and/or stress, maximize coping  skills, provide positive support system. Participant is able to verbalize types and ability to use techniques and skills needed for reducing stress and depression.   Education: Stress, Anxiety, and Depression - Group verbal and visual presentation to define topics covered.  Reviews how body is impacted by stress, anxiety, and depression.  Also discusses healthy ways to reduce stress and to treat/manage anxiety and depression.  Written material given at graduation. Flowsheet Row Cardiac Rehab from 08/18/2018 in [facility] Cardiac and Pulmonary Rehab  Date 08/16/18  Educator [facility]  Instruction Review Code 1- [employer] Understanding       Education: Sleep Hygiene -Provides group verbal and written instruction about how sleep can affect your health.  Define sleep hygiene, discuss sleep cycles and impact of sleep habits. Review good sleep hygiene tips.  Flowsheet Row Cardiac Rehab from 08/18/2018 in [facility] Cardiac and Pulmonary Rehab  Date 07/19/18  Educator [facility]  Instruction Review Code 1- Verbalizes Understanding       Initial Review & Psychosocial Screening:  Initial Psych Review & Screening - 07/13/23 1317       Initial Review   Current issues with None Identified      Family Dynamics   Good Support System? Yes   husband, son and daughter in law, other daughter     Barriers   Psychosocial barriers to participate in program There are no identifiable barriers or psychosocial needs.      Screening Interventions   Interventions To provide support and resources with identified psychosocial needs;Provide feedback about the scores to participant;Encouraged to exercise    Expected Outcomes Short Term goal: Utilizing psychosocial counselor, staff and physician to assist with identification of specific Stressors or current issues interfering with healing process.  Setting desired goal for each stressor or current issue identified.;Long Term Goal: Stressors or current issues are controlled or eliminated.;Short Term goal: Identification and review with participant of any Quality of Life or Depression concerns found by scoring the questionnaire.;Long Term goal: The participant improves quality of Life and PHQ9 Scores as seen by post scores and/or verbalization of changes             Quality of Life Scores:   Scores of 19 and below usually indicate a poorer quality of life in these areas.  A difference of  2-3 points is a clinically meaningful difference.  A difference of 2-3 points in the total score of the Quality of Life Index has been associated with significant improvement in overall quality of life, self-image, physical symptoms, and general health in studies assessing change in quality of life.  PHQ-9: Review Flowsheet  More data exists      07/15/2023 05/17/2023 01/29/2023 11/23/2022 05/06/2022  Depression screen PHQ 2/9  Decreased Interest 0 0 0 0 0  Down, Depressed, Hopeless 0 0 0 0 0  PHQ - 2 Score 0 0 0 0 0  Altered sleeping 0 1 0 - -  Tired, decreased energy 2 1 0 - -  Change in appetite 0 0 0 - -  Feeling bad or failure about yourself  0 0 0 - -  Trouble concentrating 0 0 0 - -  Moving slowly or fidgety/restless 0 0 0 - -  Suicidal thoughts 0 0 0 - -  PHQ-9 Score 2 2 0 - -  Difficult doing work/chores Not difficult at all Not difficult at all Not difficult at all - -    Details           Interpretation of  Total Score  Total Score Depression Severity:  1-4 = Minimal depression, 5-9 = Mild depression, 10-14 = Moderate depression, 15-19 = Moderately severe depression, 20-27 = Severe depression   Psychosocial Evaluation and Intervention:  Psychosocial Evaluation - 07/13/23 1447       Psychosocial Evaluation & Interventions   Comments Sista has no barriers to attending the program. She lives with her husband. He and her chidren and  daughter in law are her support.  She is ready to start the program and work on her exercise progression to get back to her 3 x a week  group exercise class.    Expected Outcomes STG Attends all scheduled sessions and is able to work on her exercise progression to return to level prior to the PTCA. LTG Continue to progress with her exericse without symptoms    Continue Psychosocial Services  Follow up required by staff             Psychosocial Re-Evaluation:   Psychosocial Discharge (Final Psychosocial Re-Evaluation):   Vocational Rehabilitation: Provide vocational rehab assistance to qualifying candidates.   Vocational Rehab Evaluation & Intervention:  Vocational Rehab - 07/13/23 1319       Initial Vocational Rehab Evaluation & Intervention   Assessment shows need for Vocational Rehabilitation No      Vocational Rehab Re-Evaulation   Comments retired             Education: Education Goals: Education classes will be provided on a variety of topics geared toward better understanding of heart health and risk factor modification. Participant will state understanding/return demonstration of topics presented as noted by education test scores.  Learning Barriers/Preferences:  Learning Barriers/Preferences - 07/13/23 1319       Learning Barriers/Preferences   Learning Barriers None    Learning Preferences None             General Cardiac Education Topics:  AED/CPR: - Group verbal and written instruction with the use of models to demonstrate the basic use of the AED with the basic ABC's of resuscitation. Flowsheet Row Cardiac Rehab from 08/18/2018 in [facility] Cardiac and Pulmonary Rehab  Date 06/02/18  Educator SB  Instruction Review Code 1- Verbalizes Understanding       Anatomy and Cardiac Procedures: - Group verbal and visual presentation and models provide information about basic cardiac anatomy and function. Reviews the testing methods done to diagnose heart  disease and the outcomes of the test results. Describes the treatment choices: Medical Management, Angioplasty, or Coronary Bypass Surgery for treating various heart conditions including Myocardial Infarction, Angina, Valve Disease, and Cardiac Arrhythmias.  Written material given at graduation. Flowsheet Row Cardiac Rehab from 08/18/2018 in [facility] Cardiac and Pulmonary Rehab  Date 05/26/18  Educator SB  Instruction Review Code 5- Refused Teaching       Medication Safety: - Group verbal and visual instruction to review commonly prescribed medications for heart and lung disease. Reviews the medication, class of the drug, and side effects. Includes the steps to properly store meds and maintain the prescription regimen.  Written material given at graduation. Flowsheet Row Cardiac Rehab from 08/18/2018 in [facility] Cardiac and Pulmonary Rehab  Date 07/12/18  Educator SB  Instruction Review Code 5- Refused Teaching       Intimacy: - Group verbal instruction through game format to discuss how heart and lung disease can affect sexual intimacy. Written material given at graduation.. Flowsheet Row Cardiac Rehab from 08/18/2018 in [facility] Cardiac and Pulmonary Rehab  Date 08/09/18  Educator SB  Instruction Review Code 5- Refused Teaching       Know Your Numbers and Heart Failure: - Group verbal and visual instruction to discuss disease risk factors for cardiac and pulmonary disease and treatment options.  Reviews associated critical values for Overweight/Obesity, Hypertension, Cholesterol, and Diabetes.  Discusses basics of heart failure: signs/symptoms and treatments.  Introduces Heart Failure Zone chart for action plan for heart failure.  Written material given at graduation. Flowsheet Row Cardiac Rehab from 08/18/2018 in [facility] Cardiac and Pulmonary Rehab  Date 05/26/18  Educator SB  Instruction Review Code 5- Refused Teaching       Infection Prevention: - Provides verbal and written material to  individual with discussion of infection control including proper hand washing and proper equipment cleaning during exercise session. Flowsheet Row Cardiac Rehab from 07/15/2023 in [facility] Cardiac and Pulmonary Rehab  Date 07/15/23  Educator NT  Instruction Review Code 1- Verbalizes Understanding       Falls Prevention: - Provides verbal and written material to individual with discussion of falls prevention and safety. Flowsheet Row Cardiac Rehab from 07/15/2023 in [facility] Cardiac and Pulmonary Rehab  Date 07/13/23  Educator SB  Instruction Review Code 1- Verbalizes Understanding       Other: -Provides group and verbal instruction on various topics (see comments)   Knowledge Questionnaire Score:   Core Components/Risk Factors/Patient Goals at Admission:  Personal Goals and Risk Factors at Admission - 07/13/23 1320       Core Components/Risk Factors/Patient Goals on Admission    Weight Management Yes;Weight Loss    Intervention Weight Management: Develop a combined nutrition and exercise program designed to reach desired caloric intake, while maintaining appropriate intake of nutrient and fiber, sodium and fats, and appropriate energy expenditure required for the weight goal.;Weight Management: Provide education and appropriate resources to help participant work on and attain dietary goals.    Admit Weight 179 lb (81.2 kg)    Goal Weight: Short Term 178 lb (80.7 kg)    Goal Weight: Long Term 169 lb (76.7 kg)    Expected Outcomes Short Term: Continue to assess and modify interventions until short term weight is achieved;Long Term: Adherence to nutrition and physical activity/exercise program aimed toward attainment of established weight goal    Improve shortness of breath with ADL's Yes    Intervention Provide education, individualized exercise plan and daily activity instruction to help decrease symptoms of SOB with activities of daily living.    Expected Outcomes Short Term: Improve  cardiorespiratory fitness to achieve a reduction of symptoms when performing ADLs;Long Term: Be able to perform more ADLs without symptoms or delay the onset of symptoms    Diabetes Yes   A1C every 6 months   diet controlled   Intervention Provide education about signs/symptoms and action to take for hypo/hyperglycemia.;Provide education about proper nutrition, including hydration, and aerobic/resistive exercise prescription along with prescribed medications to achieve blood glucose in normal ranges: Fasting glucose 65-99 mg/dL    Expected Outcomes Short Term: Participant verbalizes understanding of the signs/symptoms and immediate care of hyper/hypoglycemia, proper foot care and importance of medication, aerobic/resistive exercise and nutrition plan for blood glucose control.;Long Term: Attainment of HbA1C < 7%.    Heart Failure Yes    Intervention Provide a combined exercise and nutrition program that is supplemented with education, support and counseling about heart failure. Directed toward relieving symptoms such as shortness of breath, decreased exercise tolerance, and extremity edema.    Expected Outcomes Improve functional  capacity of life;Short term: Attendance in program 2-3 days a week with increased exercise capacity. Reported lower sodium intake. Reported increased fruit and vegetable intake. Reports medication compliance.;Short term: Daily weights obtained and reported for increase. Utilizing diuretic protocols set by physician.;Long term: Adoption of self-care skills and reduction of barriers for early signs and symptoms recognition and intervention leading to self-care maintenance.    Hypertension Yes    Intervention Provide education on lifestyle modifcations including regular physical activity/exercise, weight management, moderate sodium restriction and increased consumption of fresh fruit, vegetables, and low fat dairy, alcohol moderation, and smoking cessation.;Monitor prescription use  compliance.    Expected Outcomes Short Term: Continued assessment and intervention until BP is < 140/35mm HG in hypertensive participants. < 130/57mm HG in hypertensive participants with diabetes, heart failure or chronic kidney disease.;Long Term: Maintenance of blood pressure at goal levels.    Lipids Yes    Intervention Provide education and support for participant on nutrition & aerobic/resistive exercise along with prescribed medications to achieve LDL 70mg , HDL >40mg .    Expected Outcomes Short Term: Participant states understanding of desired cholesterol values and is compliant with medications prescribed. Participant is following exercise prescription and nutrition guidelines.;Long Term: Cholesterol controlled with medications as prescribed, with individualized exercise RX and with personalized nutrition plan. Value goals: LDL < 70mg , HDL > 40 mg.             Education:Diabetes - Individual verbal and written instruction to review signs/symptoms of diabetes, desired ranges of glucose level fasting, after meals and with exercise. Acknowledge that pre and post exercise glucose checks will be done for 3 sessions at entry of program.   Core Components/Risk Factors/Patient Goals Review:    Core Components/Risk Factors/Patient Goals at Discharge (Final Review):    ITP Comments:  ITP Comments     Row Name 07/13/23 1453 07/15/23 1426         ITP Comments Virtual orientation call completed today. shehas an appointment on Date: 07/15/23  for EP eval and gym Orientation.  Documentation of diagnosis can be found in [facility] 06/25/2023 . Completed and gym orientation. Initial ITP created and sent for review to Dr. Bethann Punches, Medical Director.               Comments: Initial ITP

## 2023-07-15 NOTE — Progress Notes (Signed)
Daily Session Note  Patient Details  Name: Stacie Mcdonald MRN: [id number] Date of Birth: November 19, 1945 Referring Provider:   Flowsheet Row Cardiac Rehab from 05/12/2018 in [facility]  Referring Provider [facility]       Encounter Date: 07/15/2023  Check In:      Social History   Tobacco Use  Smoking Status Never  Smokeless Tobacco Never    Goals Met:  Independence with exercise equipment Exercise tolerated well No report of concerns or symptoms today  Goals Unmet:  Not Applicable  Comments: Pt able to follow exercise prescription today without complaint.  Will continue to monitor for progression.    Dr. Bethann Punches is Medical Director for [facility] Card[facility].  Dr. Vida Rigger is Medical Director for [facility] Pulmonary [facility].

## 2023-07-21 ENCOUNTER — Encounter: Payer: Medicare Other | Attending: Internal Medicine | Admitting: *Deleted

## 2023-07-21 ENCOUNTER — Encounter: Payer: Self-pay | Admitting: *Deleted

## 2023-07-21 DIAGNOSIS — Z9861 Coronary angioplasty status: Secondary | ICD-10-CM

## 2023-07-21 DIAGNOSIS — Z48812 Encounter for surgical aftercare following surgery on the circulatory system: Secondary | ICD-10-CM | POA: Insufficient documentation

## 2023-07-21 LAB — GLUCOSE, CAPILLARY
Glucose-Capillary: 131 mg/dL — ABNORMAL HIGH (ref 70–99)
Glucose-Capillary: 117 mg/dL — ABNORMAL HIGH (ref 70–99)

## 2023-07-21 NOTE — Progress Notes (Signed)
Cardiac Individual Treatment Plan  Patient Details  Name: Stacie Mcdonald MRN: [id number] Date of Birth: 1946-04-02 Referring Provider:   Flowsheet Row Cardiac Rehab from 07/15/2023 in [facility] Cardiac and Pulmonary Rehab  Referring Provider Dr. Dorothyann Peng, MD       Initial Encounter Date:  Flowsheet Row Cardiac Rehab from 07/15/2023 in [facility] Cardiac and Pulmonary Rehab  Date 07/15/23       Visit Diagnosis: S/P PTCA (percutaneous transluminal coronary angioplasty)  Patient's Home Medications on Admission:  Current Outpatient Medications:    albuterol (VENTOLIN HFA) 108 (90 Base) MCG/ACT inhaler, Inhale 2 puffs into the lungs every 6 (six) hours as needed. Using in Am daily  per MD, Disp: , Rfl:    aspirin 81 MG tablet, Take 81 mg by mouth daily., Disp: , Rfl:    clopidogrel (PLAVIX) 75 MG tablet, TAKE ONE TABLET BY MOUTH DAILY (Patient taking differently: Take 75 mg by mouth daily.), Disp: 90 tablet, Rfl: 1   D-MANNOSE PO, Take 800 mg by mouth daily., Disp: , Rfl:    ELDERBERRY PO, Take 1,000 mg by mouth daily., Disp: , Rfl:    empagliflozin (JARDIANCE) 10 MG TABS tablet, Take 1 tablet (10 mg total) by mouth daily before breakfast., Disp: 90 tablet, Rfl: 2   furosemide (LASIX) 40 MG tablet, Take 40 mg by mouth daily., Disp: , Rfl:    GLUCOSAMINE-CHONDROITIN PO, Take 2 tablets by mouth daily., Disp: , Rfl:    isosorbide mononitrate (IMDUR) 120 MG 24 hr tablet, Take 120 mg by mouth daily. , Disp: , Rfl:    loratadine (CLARITIN) 10 MG tablet, Take 10 mg by mouth daily as needed for allergies., Disp: , Rfl:    metoprolol (TOPROL-XL) 200 MG 24 hr tablet, TAKE 1 TABLET BY MOUTH DAILY. (Patient taking differently: Take 200 mg by mouth daily. TAKE 1 TABLET BY MOUTH DAILY.), Disp: 90 tablet, Rfl: 1   nitroGLYCERIN (NITROSTAT) 0.4 MG SL tablet, Place 0.4 mg under the tongue every 5 (five) minutes as needed for chest pain., Disp: , Rfl:    omeprazole (PRILOSEC) 20 MG capsule, Take 1 capsule (20  mg total) by mouth daily. (Patient taking differently: Take 20 mg by mouth daily as needed (acid reflux).), Disp: 90 capsule, Rfl: 1   OVER THE COUNTER MEDICATION, Take 50 mg by mouth daily. Blood sugar support with Pyerogel (Patient not taking: Reported on 06/25/2023), Disp: , Rfl:    PRALUENT 75 MG/ML SOAJ, Inject 75 mg into the muscle every 14 (fourteen) days., Disp: , Rfl:    ranolazine (RANEXA) 500 MG 12 hr tablet, Take 500 mg by mouth 2 (two) times daily., Disp: , Rfl:    sacubitril-valsartan (ENTRESTO) 49-51 MG, Take 1 tablet by mouth 2 (two) times daily., Disp: , Rfl:    spironolactone (ALDACTONE) 25 MG tablet, Take 1 tablet (25 mg total) by mouth daily., Disp: 90 tablet, Rfl: 0   traZODone (DESYREL) 150 MG tablet, Take 1 tablet (150 mg total) by mouth at bedtime., Disp: 90 tablet, Rfl: 0  Past Medical History: Past Medical History:  Diagnosis Date   Allergy    Arthritis    fingers, knees   Asthma    Breast Cancer 1991   R mastectomy. 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   Sleep apnea    CPAP   Stable angina 07/27/2019   Thoracic vertebral fracture (HCC) 04/05/2012   Urinary tract infection    Wears contact lenses    left only    Tobacco Use: Social History   Tobacco Use  Smoking Status Never  Smokeless Tobacco Never    Labs: Review Flowsheet  More data exists      Latest Ref Rng & Units 08/02/2019 03/31/2021  11/04/2021 05/06/2022 11/23/2022  Labs for ITP Cardiac and Pulmonary Rehab  Cholestrol 0 - 200 mg/dL - 161  096  045  409   LDL (calc) 0 - 99 mg/dL - 44  38  58  46   Direct LDL mg/dL - - - 81.1  91.4   HDL-C >39.00 mg/dL - 78.29  56.21  30.86  44.10   Trlycerides 0.0 - 149.0 mg/dL - 578.4  696.2  952.8  132.0   Hemoglobin A1c 4.6 - 6.5 % 6.4  6.3  6.5  6.6  6.3     Details             Exercise Target Goals: Exercise Program Goal: Individual exercise prescription set using results from initial 6 min walk test and THRR while considering  patient's activity barriers and safety.   Exercise Prescription Goal: Initial exercise prescription builds to 30-45 minutes a day of aerobic activity, 2-3 days per week.  Home exercise guidelines will be given to patient during program as part of exercise prescription that the participant will acknowledge.   Education: Aerobic Exercise: - Group verbal and visual presentation on the components of exercise prescription. Introduces F.I.T.T principle from ACSM for exercise prescriptions.  Reviews F.I.T.T. principles of aerobic exercise including progression. Written material given at graduation. Flowsheet Row Cardiac Rehab from 08/18/2018 in [facility] Cardiac and Pulmonary Rehab  Date 08/18/18  Educator [facility]  Instruction Review Code 5- Refused Teaching       Education: Resistance Exercise: - Group verbal and visual presentation on the components of exercise prescription. Introduces F.I.T.T principle from ACSM for exercise prescriptions  Reviews F.I.T.T. principles of resistance exercise including progression. Written material given at graduation.    Education: Exercise & Equipment Safety: - Individual verbal instruction and demonstration of equipment use and safety with use of the equipment. Flowsheet Row Cardiac Rehab from 07/15/2023 in [facility] Cardiac and Pulmonary Rehab  Date 07/15/23  Educator NT  Instruction Review Code 1- Verbalizes Understanding        Education: Exercise Physiology & General Exercise Guidelines: - Group verbal and written instruction with models to review the exercise physiology of the cardiovascular system and associated critical values. Provides general exercise guidelines with specific guidelines to those with heart or lung disease.  Flowsheet Row Cardiac Rehab from 08/18/2018 in [facility] Cardiac and Pulmonary Rehab  Date 08/11/18  Educator [facility]  Instruction Review Code 1- Verbalizes Understanding       Education: Flexibility, Balance, Mind/Body Relaxation: - Group verbal and visual presentation with interactive activity on the components of exercise prescription. Introduces F.I.T.T principle from ACSM  for exercise prescriptions. Reviews F.I.T.T. principles of flexibility and balance exercise training including progression. Also discusses the mind body connection.  Reviews various relaxation techniques to help reduce and manage stress (i.e. Deep breathing, progressive muscle relaxation, and visualization). Balance handout provided to take home. Written material given at graduation. Flowsheet Row Cardiac Rehab from 08/18/2018 in [facility] Cardiac and Pulmonary Rehab  Date 06/28/18  Educator AS  Instruction Review Code 5- Refused Teaching       Activity Barriers & Risk Stratification:  Activity Barriers & Cardiac Risk Stratification - 07/15/23 1439       Activity Barriers & Cardiac Risk Stratification   Activity Barriers Shortness of Breath;Chest Pain/Angina;Deconditioning;Muscular Weakness    Cardiac Risk Stratification High             6 Minute Walk:  6 Minute Walk     Row Name 07/15/23 1438         6 Minute Walk   Phase Initial     Distance 1055 feet     Walk Time 6 minutes     # of Rest Breaks 0     MPH 1.99     METS 1.92     RPE 15     Perceived Dyspnea  2     VO2 Peak 6.72     Symptoms Yes (comment)     Comments SOB, leg weakness     Resting HR 80 bpm     Resting BP 124/68     Resting Oxygen  Saturation  93 %     Exercise Oxygen Saturation  during 6 min walk 93 %     Max Ex. HR 108 bpm     Max Ex. BP 142/74     2 Minute Post BP 120/68              Oxygen Initial Assessment:   Oxygen Re-Evaluation:   Oxygen Discharge (Final Oxygen Re-Evaluation):   Initial Exercise Prescription:  Initial Exercise Prescription - 07/15/23 1400       Date of Initial Exercise RX and Referring Provider   Date 07/15/23    Referring Provider Dr. Dorothyann Peng, MD      Oxygen   Maintain Oxygen Saturation 88% or higher      Treadmill   MPH 1.4    Grade 0    Minutes 15    METs 2.07      NuStep   Level 2    SPM 80    Minutes 15    METs 1.92      Biostep-RELP   Level 1    SPM 50    Minutes 15    METs 1.92      Prescription Details   Frequency (times per week) 3    Duration Progress to 30 minutes of continuous aerobic without signs/symptoms of physical distress      Intensity   THRR 40-80% of Max Heartrate 104-129    Ratings of Perceived Exertion 11-13    Perceived Dyspnea 0-4      Progression   Progression Continue to progress workloads to maintain intensity without signs/symptoms of physical distress.      Resistance Training   Training Prescription Yes    Weight 3 lb    Reps 10-15             Perform Capillary Blood Glucose checks as needed.  Exercise Prescription Changes:   Exercise Prescription Changes     Row Name 07/15/23 1400  Response to Exercise   Blood Pressure (Admit) 124/68       Blood Pressure (Exercise) 142/74       Blood Pressure (Exit) 120/68       Heart Rate (Admit) 80 bpm       Heart Rate (Exercise) 108 bpm       Heart Rate (Exit) 87 bpm       Oxygen Saturation (Admit) 93 %       Oxygen Saturation (Exercise) 93 %       Rating of Perceived Exertion (Exercise) 15       Perceived Dyspnea (Exercise) 2       Symptoms SOB, leg weakness       Comments Results                Exercise  Comments:   Exercise Goals and Review:   Exercise Goals     Row Name 07/15/23 1440             Exercise Goals   Increase Physical Activity Yes       Intervention Provide advice, education, support and counseling about physical activity/exercise needs.;Develop an individualized exercise prescription for aerobic and resistive training based on initial evaluation findings, risk stratification, comorbidities and participant's personal goals.       Expected Outcomes Short Term: Attend rehab on a regular basis to increase amount of physical activity.;Long Term: Add in home exercise to make exercise part of routine and to increase amount of physical activity.;Long Term: Exercising regularly at least 3-5 days a week.       Increase Strength and Stamina Yes       Intervention Provide advice, education, support and counseling about physical activity/exercise needs.;Develop an individualized exercise prescription for aerobic and resistive training based on initial evaluation findings, risk stratification, comorbidities and participant's personal goals.       Expected Outcomes Short Term: Increase workloads from initial exercise prescription for resistance, speed, and METs.;Short Term: Perform resistance training exercises routinely during rehab and add in resistance training at home;Long Term: Improve cardiorespiratory fitness, muscular endurance and strength as measured by increased METs and functional capacity ( )       Able to understand and use rate of perceived exertion (RPE) scale Yes       Intervention Provide education and explanation on how to use RPE scale       Expected Outcomes Short Term: Able to use RPE daily in rehab to express subjective intensity level;Long Term:  Able to use RPE to guide intensity level when exercising independently       Able to understand and use Dyspnea scale Yes       Intervention Provide education and explanation on how to use Dyspnea scale       Expected  Outcomes Short Term: Able to use Dyspnea scale daily in rehab to express subjective sense of shortness of breath during exertion;Long Term: Able to use Dyspnea scale to guide intensity level when exercising independently       Knowledge and understanding of Target Heart Rate Range (THRR) Yes       Intervention Provide education and explanation of THRR including how the numbers were predicted and where they are located for reference       Expected Outcomes Short Term: Able to state/look up THRR;Short Term: Able to use daily as guideline for intensity in rehab;Long Term: Able to use THRR to govern intensity when exercising independently       Able to  check pulse independently Yes       Intervention Provide education and demonstration on how to check pulse in carotid and radial arteries.;Review the importance of being able to check your own pulse for safety during independent exercise       Expected Outcomes Short Term: Able to explain why pulse checking is important during independent exercise;Long Term: Able to check pulse independently and accurately       Understanding of Exercise Prescription Yes       Intervention Provide education, explanation, and written materials on patient's individual exercise prescription       Expected Outcomes Short Term: Able to explain program exercise prescription;Long Term: Able to explain home exercise prescription to exercise independently                Exercise Goals Re-Evaluation :   Discharge Exercise Prescription (Final Exercise Prescription Changes):  Exercise Prescription Changes - 07/15/23 1400       Response to Exercise   Blood Pressure (Admit) 124/68    Blood Pressure (Exercise) 142/74    Blood Pressure (Exit) 120/68    Heart Rate (Admit) 80 bpm    Heart Rate (Exercise) 108 bpm    Heart Rate (Exit) 87 bpm    Oxygen Saturation (Admit) 93 %    Oxygen Saturation (Exercise) 93 %    Rating of Perceived Exertion (Exercise) 15    Perceived  Dyspnea (Exercise) 2    Symptoms SOB, leg weakness    Comments Results             Nutrition:  Target Goals: Understanding of nutrition guidelines, daily intake of sodium 1500mg , cholesterol 200mg , calories 30% from fat and 7% or less from saturated fats, daily to have 5 or more servings of fruits and vegetables.  Education: All About Nutrition: -Group instruction provided by verbal, written material, interactive activities, discussions, models, and posters to present general guidelines for heart healthy nutrition including fat, fiber, MyPlate, the role of sodium in heart healthy nutrition, utilization of the nutrition label, and utilization of this knowledge for meal planning. Follow up email sent as well. Written material given at graduation. Flowsheet Row Cardiac Rehab from 08/18/2018 in [facility] Cardiac and Pulmonary Rehab  Date 05/31/18  Educator CR  Instruction Review Code 1- Verbalizes Understanding       Biometrics:  Pre Biometrics - 07/15/23 1440       Pre Biometrics   Height 5' 3.5" (1.613 m)    Weight 181 lb 1.6 oz (82.1 kg)    Waist Circumference 40 inches    Hip Circumference 42.5 inches    Waist to Hip Ratio 0.94 %    BMI (Calculated) 31.57    Single Leg Stand 8.3 seconds              Nutrition Therapy Plan and Nutrition Goals:   Nutrition Assessments:  MEDIFICTS Score Key: ?70 Need to make dietary changes  40-70 Heart Healthy Diet ? 40 Therapeutic Level Cholesterol Diet   Picture Your Plate Scores: <16 Unhealthy dietary pattern with much room for improvement. 41-50 Dietary pattern unlikely to meet recommendations for good health and room for improvement. 51-60 More healthful dietary pattern, with some room for improvement.  >60 Healthy dietary pattern, although there may be some specific behaviors that could be improved.    Nutrition Goals Re-Evaluation:   Nutrition Goals Discharge (Final Nutrition Goals  Re-Evaluation):   Psychosocial: Target Goals: Acknowledge presence or absence of significant depression and/or stress, maximize coping  skills, provide positive support system. Participant is able to verbalize types and ability to use techniques and skills needed for reducing stress and depression.   Education: Stress, Anxiety, and Depression - Group verbal and visual presentation to define topics covered.  Reviews how body is impacted by stress, anxiety, and depression.  Also discusses healthy ways to reduce stress and to treat/manage anxiety and depression.  Written material given at graduation. Flowsheet Row Cardiac Rehab from 08/18/2018 in [facility] Cardiac and Pulmonary Rehab  Date 08/16/18  Educator [facility]  Instruction Review Code 1- [employer] Understanding       Education: Sleep Hygiene -Provides group verbal and written instruction about how sleep can affect your health.  Define sleep hygiene, discuss sleep cycles and impact of sleep habits. Review good sleep hygiene tips.  Flowsheet Row Cardiac Rehab from 08/18/2018 in [facility] Cardiac and Pulmonary Rehab  Date 07/19/18  Educator [facility]  Instruction Review Code 1- Verbalizes Understanding       Initial Review & Psychosocial Screening:  Initial Psych Review & Screening - 07/13/23 1317       Initial Review   Current issues with None Identified      Family Dynamics   Good Support System? Yes   husband, son and daughter in law, other daughter     Barriers   Psychosocial barriers to participate in program There are no identifiable barriers or psychosocial needs.      Screening Interventions   Interventions To provide support and resources with identified psychosocial needs;Provide feedback about the scores to participant;Encouraged to exercise    Expected Outcomes Short Term goal: Utilizing psychosocial counselor, staff and physician to assist with identification of specific Stressors or current issues interfering with healing process.  Setting desired goal for each stressor or current issue identified.;Long Term Goal: Stressors or current issues are controlled or eliminated.;Short Term goal: Identification and review with participant of any Quality of Life or Depression concerns found by scoring the questionnaire.;Long Term goal: The participant improves quality of Life and PHQ9 Scores as seen by post scores and/or verbalization of changes             Quality of Life Scores:   Scores of 19 and below usually indicate a poorer quality of life in these areas.  A difference of  2-3 points is a clinically meaningful difference.  A difference of 2-3 points in the total score of the Quality of Life Index has been associated with significant improvement in overall quality of life, self-image, physical symptoms, and general health in studies assessing change in quality of life.  PHQ-9: Review Flowsheet  More data exists      07/15/2023 05/17/2023 01/29/2023 11/23/2022 05/06/2022  Depression screen PHQ 2/9  Decreased Interest 0 0 0 0 0  Down, Depressed, Hopeless 0 0 0 0 0  PHQ - 2 Score 0 0 0 0 0  Altered sleeping 0 1 0 - -  Tired, decreased energy 2 1 0 - -  Change in appetite 0 0 0 - -  Feeling bad or failure about yourself  0 0 0 - -  Trouble concentrating 0 0 0 - -  Moving slowly or fidgety/restless 0 0 0 - -  Suicidal thoughts 0 0 0 - -  PHQ-9 Score 2 2 0 - -  Difficult doing work/chores Not difficult at all Not difficult at all Not difficult at all - -    Details           Interpretation of  Total Score  Total Score Depression Severity:  1-4 = Minimal depression, 5-9 = Mild depression, 10-14 = Moderate depression, 15-19 = Moderately severe depression, 20-27 = Severe depression   Psychosocial Evaluation and Intervention:  Psychosocial Evaluation - 07/13/23 1447       Psychosocial Evaluation & Interventions   Comments Latreva has no barriers to attending the program. She lives with her husband. He and her chidren and  daughter in law are her support.  She is ready to start the program and work on her exercise progression to get back to her 3 x a week  group exercise class.    Expected Outcomes STG Attends all scheduled sessions and is able to work on her exercise progression to return to level prior to the PTCA. LTG Continue to progress with her exericse without symptoms    Continue Psychosocial Services  Follow up required by staff             Psychosocial Re-Evaluation:   Psychosocial Discharge (Final Psychosocial Re-Evaluation):   Vocational Rehabilitation: Provide vocational rehab assistance to qualifying candidates.   Vocational Rehab Evaluation & Intervention:  Vocational Rehab - 07/13/23 1319       Initial Vocational Rehab Evaluation & Intervention   Assessment shows need for Vocational Rehabilitation No      Vocational Rehab Re-Evaulation   Comments retired             Education: Education Goals: Education classes will be provided on a variety of topics geared toward better understanding of heart health and risk factor modification. Participant will state understanding/return demonstration of topics presented as noted by education test scores.  Learning Barriers/Preferences:  Learning Barriers/Preferences - 07/13/23 1319       Learning Barriers/Preferences   Learning Barriers None    Learning Preferences None             General Cardiac Education Topics:  AED/CPR: - Group verbal and written instruction with the use of models to demonstrate the basic use of the AED with the basic ABC's of resuscitation. Flowsheet Row Cardiac Rehab from 08/18/2018 in [facility] Cardiac and Pulmonary Rehab  Date 06/02/18  Educator SB  Instruction Review Code 1- Verbalizes Understanding       Anatomy and Cardiac Procedures: - Group verbal and visual presentation and models provide information about basic cardiac anatomy and function. Reviews the testing methods done to diagnose heart  disease and the outcomes of the test results. Describes the treatment choices: Medical Management, Angioplasty, or Coronary Bypass Surgery for treating various heart conditions including Myocardial Infarction, Angina, Valve Disease, and Cardiac Arrhythmias.  Written material given at graduation. Flowsheet Row Cardiac Rehab from 08/18/2018 in [facility] Cardiac and Pulmonary Rehab  Date 05/26/18  Educator SB  Instruction Review Code 5- Refused Teaching       Medication Safety: - Group verbal and visual instruction to review commonly prescribed medications for heart and lung disease. Reviews the medication, class of the drug, and side effects. Includes the steps to properly store meds and maintain the prescription regimen.  Written material given at graduation. Flowsheet Row Cardiac Rehab from 08/18/2018 in [facility] Cardiac and Pulmonary Rehab  Date 07/12/18  Educator SB  Instruction Review Code 5- Refused Teaching       Intimacy: - Group verbal instruction through game format to discuss how heart and lung disease can affect sexual intimacy. Written material given at graduation.. Flowsheet Row Cardiac Rehab from 08/18/2018 in [facility] Cardiac and Pulmonary Rehab  Date 08/09/18  Educator SB  Instruction Review Code 5- Refused Teaching       Know Your Numbers and Heart Failure: - Group verbal and visual instruction to discuss disease risk factors for cardiac and pulmonary disease and treatment options.  Reviews associated critical values for Overweight/Obesity, Hypertension, Cholesterol, and Diabetes.  Discusses basics of heart failure: signs/symptoms and treatments.  Introduces Heart Failure Zone chart for action plan for heart failure.  Written material given at graduation. Flowsheet Row Cardiac Rehab from 08/18/2018 in [facility] Cardiac and Pulmonary Rehab  Date 05/26/18  Educator SB  Instruction Review Code 5- Refused Teaching       Infection Prevention: - Provides verbal and written material to  individual with discussion of infection control including proper hand washing and proper equipment cleaning during exercise session. Flowsheet Row Cardiac Rehab from 07/15/2023 in [facility] Cardiac and Pulmonary Rehab  Date 07/15/23  Educator NT  Instruction Review Code 1- Verbalizes Understanding       Falls Prevention: - Provides verbal and written material to individual with discussion of falls prevention and safety. Flowsheet Row Cardiac Rehab from 07/15/2023 in [facility] Cardiac and Pulmonary Rehab  Date 07/13/23  Educator SB  Instruction Review Code 1- Verbalizes Understanding       Other: -Provides group and verbal instruction on various topics (see comments)   Knowledge Questionnaire Score:   Core Components/Risk Factors/Patient Goals at Admission:  Personal Goals and Risk Factors at Admission - 07/13/23 1320       Core Components/Risk Factors/Patient Goals on Admission    Weight Management Yes;Weight Loss    Intervention Weight Management: Develop a combined nutrition and exercise program designed to reach desired caloric intake, while maintaining appropriate intake of nutrient and fiber, sodium and fats, and appropriate energy expenditure required for the weight goal.;Weight Management: Provide education and appropriate resources to help participant work on and attain dietary goals.    Admit Weight 179 lb (81.2 kg)    Goal Weight: Short Term 178 lb (80.7 kg)    Goal Weight: Long Term 169 lb (76.7 kg)    Expected Outcomes Short Term: Continue to assess and modify interventions until short term weight is achieved;Long Term: Adherence to nutrition and physical activity/exercise program aimed toward attainment of established weight goal    Improve shortness of breath with ADL's Yes    Intervention Provide education, individualized exercise plan and daily activity instruction to help decrease symptoms of SOB with activities of daily living.    Expected Outcomes Short Term: Improve  cardiorespiratory fitness to achieve a reduction of symptoms when performing ADLs;Long Term: Be able to perform more ADLs without symptoms or delay the onset of symptoms    Diabetes Yes   A1C every 6 months   diet controlled   Intervention Provide education about signs/symptoms and action to take for hypo/hyperglycemia.;Provide education about proper nutrition, including hydration, and aerobic/resistive exercise prescription along with prescribed medications to achieve blood glucose in normal ranges: Fasting glucose 65-99 mg/dL    Expected Outcomes Short Term: Participant verbalizes understanding of the signs/symptoms and immediate care of hyper/hypoglycemia, proper foot care and importance of medication, aerobic/resistive exercise and nutrition plan for blood glucose control.;Long Term: Attainment of HbA1C < 7%.    Heart Failure Yes    Intervention Provide a combined exercise and nutrition program that is supplemented with education, support and counseling about heart failure. Directed toward relieving symptoms such as shortness of breath, decreased exercise tolerance, and extremity edema.    Expected Outcomes Improve functional  capacity of life;Short term: Attendance in program 2-3 days a week with increased exercise capacity. Reported lower sodium intake. Reported increased fruit and vegetable intake. Reports medication compliance.;Short term: Daily weights obtained and reported for increase. Utilizing diuretic protocols set by physician.;Long term: Adoption of self-care skills and reduction of barriers for early signs and symptoms recognition and intervention leading to self-care maintenance.    Hypertension Yes    Intervention Provide education on lifestyle modifcations including regular physical activity/exercise, weight management, moderate sodium restriction and increased consumption of fresh fruit, vegetables, and low fat dairy, alcohol moderation, and smoking cessation.;Monitor prescription use  compliance.    Expected Outcomes Short Term: Continued assessment and intervention until BP is < 140/6mm HG in hypertensive participants. < 130/36mm HG in hypertensive participants with diabetes, heart failure or chronic kidney disease.;Long Term: Maintenance of blood pressure at goal levels.    Lipids Yes    Intervention Provide education and support for participant on nutrition & aerobic/resistive exercise along with prescribed medications to achieve LDL 70mg , HDL >40mg .    Expected Outcomes Short Term: Participant states understanding of desired cholesterol values and is compliant with medications prescribed. Participant is following exercise prescription and nutrition guidelines.;Long Term: Cholesterol controlled with medications as prescribed, with individualized exercise RX and with personalized nutrition plan. Value goals: LDL < 70mg , HDL > 40 mg.             Education:Diabetes - Individual verbal and written instruction to review signs/symptoms of diabetes, desired ranges of glucose level fasting, after meals and with exercise. Acknowledge that pre and post exercise glucose checks will be done for 3 sessions at entry of program.   Core Components/Risk Factors/Patient Goals Review:    Core Components/Risk Factors/Patient Goals at Discharge (Final Review):    ITP Comments:  ITP Comments     Row Name 07/13/23 1453 07/15/23 1426 07/21/23 1041       ITP Comments Virtual orientation call completed today. shehas an appointment on Date: 07/15/23  for EP eval and gym Orientation.  Documentation of diagnosis can be found in [facility] 06/25/2023 . Completed and gym orientation. Initial ITP created and sent for review to Dr. Bethann Punches, Medical Director. 30 Day review completed. Medical Director ITP review done, changes made as directed, and signed approval by Medical Director.     new to program              Comments:

## 2023-07-21 NOTE — Progress Notes (Signed)
Daily Session Note  Patient Details  Name: Stacie Mcdonald MRN: [id number] Date of Birth: Oct 20, 1946 Referring Provider:   Flowsheet Row Cardiac Rehab from 07/15/2023 in [facility] Cardiac and Pulmonary Rehab  Referring Provider Dr. Dorothyann Peng, MD       Encounter Date: 07/21/2023  Check In:  Session Check In - 07/21/23 1417       Check-In   Supervising physician immediately available to respond to emergencies See telemetry face sheet for immediately available ER MD    Location [facility]-Cardiac & Pulmonary Rehab    Staff Present Tommye Standard, BS, ACSM CEP, Exercise Physiologist;Megan Katrinka Blazing, RN, ADN;Inas Avena Jewel Baize, RN BSN    Virtual Visit No    Medication changes reported     No    Fall or balance concerns reported    No    Warm-up and Cool-down Performed on first and last piece of equipment    Resistance Training Performed Yes    VAD Patient? No    PAD/SET Patient? No      Pain Assessment   Currently in Pain? No/denies                Social History   Tobacco Use  Smoking Status Never  Smokeless Tobacco Never    Goals Met:  Independence with exercise equipment Exercise tolerated well No report of concerns or symptoms today Strength training completed today  Goals Unmet:  Not Applicable  Comments: Pt able to follow exercise prescription today without complaint.  Will continue to monitor for progression.    Dr. Bethann Punches is Medical Director for [facility] Cardiac Rehabilitation.  Dr. Vida Rigger is Medical Director for [facility] Pulmonary Rehabilitation.

## 2023-07-22 ENCOUNTER — Encounter: Payer: Medicare Other | Admitting: *Deleted

## 2023-07-22 DIAGNOSIS — Z9861 Coronary angioplasty status: Secondary | ICD-10-CM

## 2023-07-22 DIAGNOSIS — R0609 Other forms of dyspnea: Secondary | ICD-10-CM | POA: Diagnosis not present

## 2023-07-22 DIAGNOSIS — G4733 Obstructive sleep apnea (adult) (pediatric): Secondary | ICD-10-CM | POA: Diagnosis not present

## 2023-07-22 DIAGNOSIS — I5042 Chronic combined systolic (congestive) and diastolic (congestive) heart failure: Secondary | ICD-10-CM | POA: Diagnosis not present

## 2023-07-22 DIAGNOSIS — I2510 Atherosclerotic heart disease of native coronary artery without angina pectoris: Secondary | ICD-10-CM | POA: Diagnosis not present

## 2023-07-22 DIAGNOSIS — Z9889 Other specified postprocedural states: Secondary | ICD-10-CM | POA: Diagnosis not present

## 2023-07-22 DIAGNOSIS — R911 Solitary pulmonary nodule: Secondary | ICD-10-CM | POA: Diagnosis not present

## 2023-07-22 DIAGNOSIS — Z48812 Encounter for surgical aftercare following surgery on the circulatory system: Secondary | ICD-10-CM | POA: Diagnosis not present

## 2023-07-22 DIAGNOSIS — Z955 Presence of coronary angioplasty implant and graft: Secondary | ICD-10-CM | POA: Diagnosis not present

## 2023-07-22 DIAGNOSIS — R000 Tachycardia, unspecified: Secondary | ICD-10-CM | POA: Diagnosis not present

## 2023-07-22 LAB — GLUCOSE, CAPILLARY
Glucose-Capillary: 106 mg/dL — ABNORMAL HIGH (ref 70–99)
Glucose-Capillary: 88 mg/dL (ref 70–99)

## 2023-07-22 NOTE — Progress Notes (Signed)
Daily Session Note  Patient Details  Name: Stacie Mcdonald MRN: [id number] Date of Birth: 11-19-1945 Referring Provider:   Flowsheet Row Cardiac Rehab from 07/15/2023 in [facility] Cardiac and Pulmonary Rehab  Referring Provider Dr. Dorothyann Peng, MD       Encounter Date: 07/22/2023  Check In:  Session Check In - 07/22/23 1400       Check-In   Supervising physician immediately available to respond to emergencies See telemetry face sheet for immediately available ER MD    Location [facility]-Cardiac & Pulmonary Rehab    Staff Present Elige Ko, RCP,RRT,BSRT;Maxon Conetta BS, , Exercise Physiologist;Patrena Santalucia Katrinka Blazing, RN, ADN    Virtual Visit No    Medication changes reported     No    Fall or balance concerns reported    No    Warm-up and Cool-down Performed on first and last piece of equipment    Resistance Training Performed Yes    VAD Patient? No    PAD/SET Patient? No      Pain Assessment   Currently in Pain? No/denies                Social History   Tobacco Use  Smoking Status Never  Smokeless Tobacco Never    Goals Met:  Independence with exercise equipment Exercise tolerated well No report of concerns or symptoms today Strength training completed today  Goals Unmet:  Not Applicable  Comments: Pt able to follow exercise prescription today without complaint.  Will continue to monitor for progression.    Dr. Bethann Punches is Medical Director for [facility] Cardiac Rehabilitation.  Dr. Vida Rigger is Medical Director for [facility] Pulmonary Rehabilitation.

## 2023-07-26 ENCOUNTER — Ambulatory Visit: Payer: Medicare Other

## 2023-07-26 ENCOUNTER — Encounter: Payer: Medicare Other | Admitting: *Deleted

## 2023-07-26 DIAGNOSIS — Z9861 Coronary angioplasty status: Secondary | ICD-10-CM | POA: Diagnosis not present

## 2023-07-26 DIAGNOSIS — Z48812 Encounter for surgical aftercare following surgery on the circulatory system: Secondary | ICD-10-CM | POA: Diagnosis not present

## 2023-07-26 LAB — GLUCOSE, CAPILLARY
Glucose-Capillary: 112 mg/dL — ABNORMAL HIGH (ref 70–99)
Glucose-Capillary: 115 mg/dL — ABNORMAL HIGH (ref 70–99)

## 2023-07-26 NOTE — Progress Notes (Signed)
Daily Session Note  Patient Details  Name: Stacie Mcdonald MRN: [id number] Date of Birth: 1946-01-12 Referring Provider:   Flowsheet Row Cardiac Rehab from 07/15/2023 in [facility] Cardiac and Pulmonary Rehab  Referring Provider Dr. Dorothyann Peng, MD       Encounter Date: 07/26/2023  Check In:  Session Check In - 07/26/23 1430       Check-In   Supervising physician immediately available to respond to emergencies See telemetry face sheet for immediately available ER MD    Location [facility]-Cardiac & Pulmonary Rehab    Staff Present Cora Collum, RN, BSN, CCRP;Maxon Conetta BS, , Exercise Physiologist;Joseph Hatteras, [place]    Virtual Visit No    Medication changes reported     No    Fall or balance concerns reported    No    Warm-up and Cool-down Performed on first and last piece of equipment    Resistance Training Performed Yes    VAD Patient? No    PAD/SET Patient? No      Pain Assessment   Currently in Pain? No/denies                Social History   Tobacco Use  Smoking Status Never  Smokeless Tobacco Never    Goals Met:  Independence with exercise equipment Exercise tolerated well No report of concerns or symptoms today  Goals Unmet:  Not Applicable  Comments: Pt able to follow exercise prescription today without complaint.  Will continue to monitor for progression.    Dr. Bethann Punches is Medical Director for [facility] Cardiac Rehabilitation.  Dr. Vida Rigger is Medical Director for [facility] Pulmonary Rehabilitation.

## 2023-07-27 ENCOUNTER — Ambulatory Visit: Payer: Medicare Other

## 2023-07-28 ENCOUNTER — Encounter: Payer: Medicare Other | Admitting: *Deleted

## 2023-07-28 DIAGNOSIS — Z9861 Coronary angioplasty status: Secondary | ICD-10-CM

## 2023-07-28 DIAGNOSIS — Z48812 Encounter for surgical aftercare following surgery on the circulatory system: Secondary | ICD-10-CM | POA: Diagnosis not present

## 2023-07-28 NOTE — Progress Notes (Signed)
Daily Session Note  Patient Details  Name: Stacie Mcdonald MRN: [id number] Date of Birth: 1946-10-09 Referring Provider:   Flowsheet Row Cardiac Rehab from 07/15/2023 in [facility] Cardiac and Pulmonary Rehab  Referring Provider Dr. Dorothyann Peng, MD       Encounter Date: 07/28/2023  Check In:  Session Check In - 07/28/23 1441       Check-In   Supervising physician immediately available to respond to emergencies See telemetry face sheet for immediately available ER MD    Location [facility]-Cardiac & Pulmonary Rehab    Staff Present Ronette Deter, BS, Exercise Physiologist;Maxon Conetta BS, , Exercise Physiologist;Cassidy Tabet Katrinka Blazing, RN, ADN    Virtual Visit No    Medication changes reported     No    Fall or balance concerns reported    No    Warm-up and Cool-down Performed on first and last piece of equipment    Resistance Training Performed Yes    VAD Patient? No    PAD/SET Patient? No      Pain Assessment   Currently in Pain? No/denies                Social History   Tobacco Use  Smoking Status Never  Smokeless Tobacco Never    Goals Met:  Independence with exercise equipment Exercise tolerated well No report of concerns or symptoms today Strength training completed today  Goals Unmet:  Not Applicable  Comments: Pt able to follow exercise prescription today without complaint.  Will continue to monitor for progression.    Dr. Bethann Punches is Medical Director for [facility] Cardiac Rehabilitation.  Dr. Vida Rigger is Medical Director for [facility] Pulmonary Rehabilitation.

## 2023-07-29 ENCOUNTER — Encounter: Payer: Medicare Other | Admitting: *Deleted

## 2023-07-29 DIAGNOSIS — Z9861 Coronary angioplasty status: Secondary | ICD-10-CM | POA: Diagnosis not present

## 2023-07-29 DIAGNOSIS — Z48812 Encounter for surgical aftercare following surgery on the circulatory system: Secondary | ICD-10-CM | POA: Diagnosis not present

## 2023-07-29 NOTE — Progress Notes (Signed)
Daily Session Note  Patient Details  Name: Stacie Mcdonald MRN: [id number] Date of Birth: 1946/04/18 Referring Provider:   Flowsheet Row Cardiac Rehab from 07/15/2023 in [facility] Cardiac and Pulmonary Rehab  Referring Provider Dr. Dorothyann Peng, MD       Encounter Date: 07/29/2023  Check In:  Session Check In - 07/29/23 1416       Check-In   Supervising physician immediately available to respond to emergencies See telemetry face sheet for immediately available ER MD    Location [facility]-Cardiac & Pulmonary Rehab    Staff Present Elige Ko, RCP,RRT,BSRT;Maxon Conetta BS, , Exercise Physiologist;Zanyia Silbaugh Katrinka Blazing, RN, ADN    Virtual Visit No    Medication changes reported     No    Fall or balance concerns reported    No    Warm-up and Cool-down Performed on first and last piece of equipment    Resistance Training Performed Yes    VAD Patient? No    PAD/SET Patient? No      Pain Assessment   Currently in Pain? No/denies                Social History   Tobacco Use  Smoking Status Never  Smokeless Tobacco Never    Goals Met:  Independence with exercise equipment Exercise tolerated well No report of concerns or symptoms today Strength training completed today  Goals Unmet:  Not Applicable  Comments: Pt able to follow exercise prescription today without complaint.  Will continue to monitor for progression.    Dr. Bethann Punches is Medical Director for [facility] Cardiac Rehabilitation.  Dr. Vida Rigger is Medical Director for [facility] Pulmonary Rehabilitation.

## 2023-08-07 ENCOUNTER — Other Ambulatory Visit: Payer: Self-pay

## 2023-08-07 ENCOUNTER — Emergency Department: Payer: Medicare Other

## 2023-08-07 ENCOUNTER — Inpatient Hospital Stay
Admission: EM | Admit: 2023-08-07 | Discharge: 2023-08-09 | DRG: 303 | Disposition: A | Discharge status: Other Acute Inpatient Hospital | Payer: Medicare Other | Attending: Internal Medicine | Admitting: Internal Medicine

## 2023-08-07 DIAGNOSIS — I209 Angina pectoris, unspecified: Secondary | ICD-10-CM | POA: Diagnosis not present

## 2023-08-07 DIAGNOSIS — J986 Disorders of diaphragm: Secondary | ICD-10-CM | POA: Diagnosis present

## 2023-08-07 DIAGNOSIS — Z86010 Personal history of colonic polyps: Secondary | ICD-10-CM

## 2023-08-07 DIAGNOSIS — E782 Mixed hyperlipidemia: Secondary | ICD-10-CM | POA: Diagnosis not present

## 2023-08-07 DIAGNOSIS — Z833 Family history of diabetes mellitus: Secondary | ICD-10-CM

## 2023-08-07 DIAGNOSIS — I200 Unstable angina: Secondary | ICD-10-CM | POA: Diagnosis not present

## 2023-08-07 DIAGNOSIS — Z7982 Long term (current) use of aspirin: Secondary | ICD-10-CM | POA: Diagnosis not present

## 2023-08-07 DIAGNOSIS — Z79899 Other long term (current) drug therapy: Secondary | ICD-10-CM | POA: Diagnosis not present

## 2023-08-07 DIAGNOSIS — Z955 Presence of coronary angioplasty implant and graft: Secondary | ICD-10-CM

## 2023-08-07 DIAGNOSIS — K219 Gastro-esophageal reflux disease without esophagitis: Secondary | ICD-10-CM | POA: Diagnosis not present

## 2023-08-07 DIAGNOSIS — Y831 Surgical operation with implant of artificial internal device as the cause of abnormal reaction of the patient, or of later complication, without mention of misadventure at the time of the procedure: Secondary | ICD-10-CM | POA: Diagnosis not present

## 2023-08-07 DIAGNOSIS — Z23 Encounter for immunization: Secondary | ICD-10-CM | POA: Diagnosis not present

## 2023-08-07 DIAGNOSIS — Z9011 Acquired absence of right breast and nipple: Secondary | ICD-10-CM | POA: Diagnosis not present

## 2023-08-07 DIAGNOSIS — I2489 Other forms of acute ischemic heart disease: Secondary | ICD-10-CM | POA: Diagnosis not present

## 2023-08-07 DIAGNOSIS — F5102 Adjustment insomnia: Secondary | ICD-10-CM | POA: Diagnosis present

## 2023-08-07 DIAGNOSIS — Z880 Allergy status to penicillin: Secondary | ICD-10-CM

## 2023-08-07 DIAGNOSIS — I25110 Atherosclerotic heart disease of native coronary artery with unstable angina pectoris: Principal | ICD-10-CM | POA: Diagnosis present

## 2023-08-07 DIAGNOSIS — I2510 Atherosclerotic heart disease of native coronary artery without angina pectoris: Secondary | ICD-10-CM | POA: Diagnosis present

## 2023-08-07 DIAGNOSIS — Z7901 Long term (current) use of anticoagulants: Secondary | ICD-10-CM

## 2023-08-07 DIAGNOSIS — R9389 Abnormal findings on diagnostic imaging of other specified body structures: Secondary | ICD-10-CM | POA: Diagnosis not present

## 2023-08-07 DIAGNOSIS — Z888 Allergy status to other drugs, medicaments and biological substances status: Secondary | ICD-10-CM | POA: Diagnosis not present

## 2023-08-07 DIAGNOSIS — I10 Essential (primary) hypertension: Secondary | ICD-10-CM | POA: Diagnosis present

## 2023-08-07 DIAGNOSIS — Z923 Personal history of irradiation: Secondary | ICD-10-CM | POA: Diagnosis not present

## 2023-08-07 DIAGNOSIS — Z8249 Family history of ischemic heart disease and other diseases of the circulatory system: Secondary | ICD-10-CM

## 2023-08-07 DIAGNOSIS — M8580 Other specified disorders of bone density and structure, unspecified site: Secondary | ICD-10-CM | POA: Diagnosis present

## 2023-08-07 DIAGNOSIS — I110 Hypertensive heart disease with heart failure: Secondary | ICD-10-CM | POA: Diagnosis not present

## 2023-08-07 DIAGNOSIS — Z853 Personal history of malignant neoplasm of breast: Secondary | ICD-10-CM

## 2023-08-07 DIAGNOSIS — Z7984 Long term (current) use of oral hypoglycemic drugs: Secondary | ICD-10-CM | POA: Diagnosis not present

## 2023-08-07 DIAGNOSIS — J4489 Other specified chronic obstructive pulmonary disease: Secondary | ICD-10-CM | POA: Diagnosis present

## 2023-08-07 DIAGNOSIS — I252 Old myocardial infarction: Secondary | ICD-10-CM

## 2023-08-07 DIAGNOSIS — E785 Hyperlipidemia, unspecified: Secondary | ICD-10-CM | POA: Diagnosis present

## 2023-08-07 DIAGNOSIS — Z885 Allergy status to narcotic agent status: Secondary | ICD-10-CM | POA: Diagnosis not present

## 2023-08-07 DIAGNOSIS — I5022 Chronic systolic (congestive) heart failure: Secondary | ICD-10-CM | POA: Diagnosis not present

## 2023-08-07 DIAGNOSIS — I214 Non-ST elevation (NSTEMI) myocardial infarction: Principal | ICD-10-CM | POA: Diagnosis present

## 2023-08-07 DIAGNOSIS — R0602 Shortness of breath: Secondary | ICD-10-CM | POA: Diagnosis not present

## 2023-08-07 DIAGNOSIS — N644 Mastodynia: Secondary | ICD-10-CM | POA: Diagnosis not present

## 2023-08-07 DIAGNOSIS — I21A9 Other myocardial infarction type: Secondary | ICD-10-CM | POA: Diagnosis not present

## 2023-08-07 DIAGNOSIS — I493 Ventricular premature depolarization: Secondary | ICD-10-CM | POA: Diagnosis not present

## 2023-08-07 DIAGNOSIS — Z9221 Personal history of antineoplastic chemotherapy: Secondary | ICD-10-CM | POA: Diagnosis not present

## 2023-08-07 DIAGNOSIS — E1159 Type 2 diabetes mellitus with other circulatory complications: Secondary | ICD-10-CM

## 2023-08-07 DIAGNOSIS — I5032 Chronic diastolic (congestive) heart failure: Secondary | ICD-10-CM | POA: Diagnosis not present

## 2023-08-07 DIAGNOSIS — Z803 Family history of malignant neoplasm of breast: Secondary | ICD-10-CM

## 2023-08-07 DIAGNOSIS — T82855A Stenosis of coronary artery stent, initial encounter: Secondary | ICD-10-CM | POA: Diagnosis present

## 2023-08-07 DIAGNOSIS — R079 Chest pain, unspecified: Secondary | ICD-10-CM | POA: Diagnosis not present

## 2023-08-07 DIAGNOSIS — T451X5S Adverse effect of antineoplastic and immunosuppressive drugs, sequela: Secondary | ICD-10-CM | POA: Diagnosis not present

## 2023-08-07 DIAGNOSIS — I5023 Acute on chronic systolic (congestive) heart failure: Secondary | ICD-10-CM | POA: Diagnosis not present

## 2023-08-07 DIAGNOSIS — Z7902 Long term (current) use of antithrombotics/antiplatelets: Secondary | ICD-10-CM | POA: Diagnosis not present

## 2023-08-07 DIAGNOSIS — I25119 Atherosclerotic heart disease of native coronary artery with unspecified angina pectoris: Secondary | ICD-10-CM | POA: Diagnosis not present

## 2023-08-07 DIAGNOSIS — I4510 Unspecified right bundle-branch block: Secondary | ICD-10-CM | POA: Diagnosis present

## 2023-08-07 DIAGNOSIS — M542 Cervicalgia: Secondary | ICD-10-CM | POA: Diagnosis not present

## 2023-08-07 DIAGNOSIS — I25118 Atherosclerotic heart disease of native coronary artery with other forms of angina pectoris: Secondary | ICD-10-CM | POA: Diagnosis not present

## 2023-08-07 HISTORY — DX: Other complications of anesthesia, initial encounter: T88.59XA

## 2023-08-07 LAB — BASIC METABOLIC PANEL
Anion gap: 11 (ref 5–15)
BUN: 17 mg/dL (ref 8–23)
CO2: 23 mmol/L (ref 22–32)
Calcium: 9 mg/dL (ref 8.9–10.3)
Chloride: 104 mmol/L (ref 98–111)
Creatinine, Ser: 0.77 mg/dL (ref 0.44–1.00)
GFR, Estimated: >60 mL/min (ref >60)
Glucose, Bld: 103 mg/dL — ABNORMAL HIGH (ref 70–99)
Potassium: 3.9 mmol/L (ref 3.5–5.1)
Sodium: 138 mmol/L (ref 135–145)

## 2023-08-07 LAB — HEPARIN LEVEL (UNFRACTIONATED): Heparin Unfractionated: 0.11 IU/mL — ABNORMAL LOW (ref 0.30–0.70)

## 2023-08-07 LAB — CBC
HCT: 43.8 % (ref 36.0–46.0)
Hemoglobin: 14.5 g/dL (ref 12.0–15.0)
MCH: 30.5 pg (ref 26.0–34.0)
MCHC: 33.1 g/dL (ref 30.0–36.0)
MCV: 92 fL (ref 80.0–100.0)
Platelets: 254 10*3/uL (ref 150–400)
RBC: 4.76 MIL/uL (ref 3.87–5.11)
RDW: 13.3 % (ref 11.5–15.5)
WBC: 7.2 10*3/uL (ref 4.0–10.5)
nRBC: 0 % (ref 0.0–0.2)

## 2023-08-07 LAB — PROTIME-INR
INR: 1 (ref 0.8–1.2)
Prothrombin Time: 13.8 seconds (ref 11.4–15.2)

## 2023-08-07 LAB — APTT: aPTT: 26 seconds (ref 24–36)

## 2023-08-07 LAB — TROPONIN I (HIGH SENSITIVITY)
Troponin I (High Sensitivity): 37 ng/L — ABNORMAL HIGH (ref <18)
Troponin I (High Sensitivity): 58 ng/L — ABNORMAL HIGH (ref <18)

## 2023-08-07 MED ORDER — ACETAMINOPHEN 325 MG PO TABS
650.0000 mg | ORAL_TABLET | Freq: Four times a day (QID) | ORAL | Status: DC | PRN
  Administered 2023-08-07 – 2023-08-08 (×2): 650 mg via ORAL
  Filled 2023-08-07 (×3): qty 2

## 2023-08-07 MED ORDER — NITROGLYCERIN 2 % TD OINT
1.0000 [in_us] | TOPICAL_OINTMENT | Freq: Four times a day (QID) | TRANSDERMAL | Status: DC
  Administered 2023-08-07 – 2023-08-09 (×7): 1 [in_us] via TOPICAL
  Filled 2023-08-07 (×7): qty 1

## 2023-08-07 MED ORDER — SODIUM CHLORIDE 0.9 % IV BOLUS: 500.0000 mL | Freq: Once | INTRAVENOUS | Status: DC

## 2023-08-07 MED ORDER — ONDANSETRON HCL 4 MG/2ML IJ SOLN: 4.0000 mg | Freq: Four times a day (QID) | INTRAMUSCULAR | Status: DC | PRN

## 2023-08-07 MED ORDER — NITROGLYCERIN 0.4 MG SL SUBL
0.4000 mg | SUBLINGUAL_TABLET | SUBLINGUAL | Status: DC | PRN
  Administered 2023-08-07: 0.4 mg via SUBLINGUAL
  Filled 2023-08-07: qty 1

## 2023-08-07 MED ORDER — FUROSEMIDE 40 MG PO TABS
40.0000 mg | ORAL_TABLET | Freq: Every day | ORAL | Status: DC
  Administered 2023-08-08 – 2023-08-09 (×2): 40 mg via ORAL
  Filled 2023-08-07 (×2): qty 1

## 2023-08-07 MED ORDER — ALBUTEROL SULFATE (2.5 MG/3ML) 0.083% IN NEBU: 2.5000 mg | INHALATION_SOLUTION | Freq: Four times a day (QID) | RESPIRATORY_TRACT | Status: DC | PRN

## 2023-08-07 MED ORDER — SODIUM CHLORIDE 0.9% FLUSH
3.0000 mL | Freq: Two times a day (BID) | INTRAVENOUS | Status: DC
  Administered 2023-08-07 – 2023-08-09 (×4): 3 mL via INTRAVENOUS

## 2023-08-07 MED ORDER — HEPARIN BOLUS VIA INFUSION
2150.0000 [IU] | Freq: Once | INTRAVENOUS | Status: AC
  Administered 2023-08-07: 2150 [IU] via INTRAVENOUS
  Filled 2023-08-07: qty 2150

## 2023-08-07 MED ORDER — SENNOSIDES-DOCUSATE SODIUM 8.6-50 MG PO TABS: 1.0000 | ORAL_TABLET | Freq: Every evening | ORAL | Status: DC | PRN

## 2023-08-07 MED ORDER — INFLUENZA VAC A&B SURF ANT ADJ 0.5 ML IM SUSY
0.5000 mL | PREFILLED_SYRINGE | INTRAMUSCULAR | Status: AC
  Administered 2023-08-08: 0.5 mL via INTRAMUSCULAR
  Filled 2023-08-07: qty 0.5

## 2023-08-07 MED ORDER — ASPIRIN 81 MG PO TBEC
81.0000 mg | DELAYED_RELEASE_TABLET | Freq: Every day | ORAL | Status: DC
  Administered 2023-08-08 – 2023-08-09 (×2): 81 mg via ORAL
  Filled 2023-08-07 (×2): qty 1

## 2023-08-07 MED ORDER — RANOLAZINE ER 500 MG PO TB12
500.0000 mg | ORAL_TABLET | Freq: Two times a day (BID) | ORAL | Status: DC
  Administered 2023-08-07 – 2023-08-08 (×3): 500 mg via ORAL
  Filled 2023-08-07 (×3): qty 1

## 2023-08-07 MED ORDER — ACETAMINOPHEN 650 MG RE SUPP: 650.0000 mg | Freq: Four times a day (QID) | RECTAL | Status: DC | PRN

## 2023-08-07 MED ORDER — NITROGLYCERIN 2 % TD OINT
1.0000 [in_us] | TOPICAL_OINTMENT | Freq: Once | TRANSDERMAL | Status: AC
  Administered 2023-08-07: 1 [in_us] via TOPICAL
  Filled 2023-08-07: qty 1

## 2023-08-07 MED ORDER — TRAZODONE HCL 50 MG PO TABS
150.0000 mg | ORAL_TABLET | Freq: Every day | ORAL | Status: DC
  Administered 2023-08-07: 150 mg via ORAL
  Filled 2023-08-07 (×4): qty 1

## 2023-08-07 MED ORDER — SACUBITRIL-VALSARTAN 49-51 MG PO TABS
1.0000 | ORAL_TABLET | Freq: Two times a day (BID) | ORAL | Status: DC
  Administered 2023-08-07 – 2023-08-09 (×4): 1 via ORAL
  Filled 2023-08-07 (×4): qty 1

## 2023-08-07 MED ORDER — SPIRONOLACTONE 25 MG PO TABS
25.0000 mg | ORAL_TABLET | Freq: Every day | ORAL | Status: DC
  Administered 2023-08-08 – 2023-08-09 (×2): 25 mg via ORAL
  Filled 2023-08-07 (×2): qty 1

## 2023-08-07 MED ORDER — HEPARIN (PORCINE) 25000 UT/250ML-% IV SOLN
1100.0000 [IU]/h | INTRAVENOUS | Status: DC
  Administered 2023-08-07: 850 [IU]/h via INTRAVENOUS
  Administered 2023-08-08: 1050 [IU]/h via INTRAVENOUS
  Administered 2023-08-09: 1100 [IU]/h via INTRAVENOUS
  Filled 2023-08-07 (×3): qty 250

## 2023-08-07 MED ORDER — ISOSORBIDE MONONITRATE ER 60 MG PO TB24
120.0000 mg | ORAL_TABLET | Freq: Every day | ORAL | Status: DC
  Administered 2023-08-08 – 2023-08-09 (×2): 120 mg via ORAL
  Filled 2023-08-07 (×2): qty 2

## 2023-08-07 MED ORDER — HEPARIN BOLUS VIA INFUSION
4000.0000 [IU] | Freq: Once | INTRAVENOUS | Status: AC
  Administered 2023-08-07: 4000 [IU] via INTRAVENOUS
  Filled 2023-08-07: qty 4000

## 2023-08-07 MED ORDER — ONDANSETRON HCL 4 MG PO TABS: 4.0000 mg | ORAL_TABLET | Freq: Four times a day (QID) | ORAL | Status: DC | PRN

## 2023-08-07 MED ORDER — ASPIRIN 81 MG PO CHEW
324.0000 mg | CHEWABLE_TABLET | Freq: Once | ORAL | Status: AC
  Administered 2023-08-07: 324 mg via ORAL
  Filled 2023-08-07: qty 4

## 2023-08-07 MED ORDER — METOPROLOL SUCCINATE ER 100 MG PO TB24
200.0000 mg | ORAL_TABLET | Freq: Every day | ORAL | Status: DC
  Administered 2023-08-08 – 2023-08-09 (×2): 200 mg via ORAL
  Filled 2023-08-07 (×2): qty 2

## 2023-08-07 MED ORDER — CLOPIDOGREL BISULFATE 75 MG PO TABS
75.0000 mg | ORAL_TABLET | Freq: Every day | ORAL | Status: DC
  Administered 2023-08-08 – 2023-08-09 (×2): 75 mg via ORAL
  Filled 2023-08-07 (×2): qty 1

## 2023-08-07 NOTE — ED Triage Notes (Addendum)
Pt to ED via POV from home. Pt reports neck and jaw pain that has been intermittent since yesterday. Pt had an angioplasty one month ago. Pt has been taking nitroglycerin PRN with minimal relief. Pt also endorses SOB with exertion. Pt is on Plavix. Pt reports hx of MI

## 2023-08-07 NOTE — Consult Note (Signed)
ANTICOAGULATION CONSULT NOTE  Pharmacy Consult for Heparin Infusion Indication: chest pain/ACS  Patient Measurements: Height: 5\' 4"  (162.6 cm) Weight: 80.7 kg (178 lb) IBW/kg (Calculated) : 54.7 Heparin Dosing Weight: 72.1 kg   Labs: Recent Labs    08/07/23 0928 08/07/23 1101 08/07/23 1218 08/07/23 1957  HGB 14.5  --   --   --   HCT 43.8  --   --   --   PLT 254  --   --   --   APTT  --   --  26  --   LABPROT  --   --  13.8  --   INR  --   --  1.0  --   HEPARINUNFRC  --   --   --  0.11*  CREATININE 0.77  --   --   --   TROPONINIHS 37* 58*  --   --    Medical History: Past Medical History:  Diagnosis Date   Allergy    Arthritis    fingers, knees   Asthma    Breast Cancer 1991   R mastectomy. 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 female presenting with intermittent jaw and neck pain. PMH significant for CAD, HTN, HLD, T2DM, ILD. Patient was not on [facility] PTA per chart review. Pharmacy has been consulted to initiate and manage heparin infusion.   Baseline Labs: aPTT 26, PT 13.8, INR 1.0, Hgb 14.5, Hct 43.8, Plt 254   Goal of Therapy:  Heparin level 0.3-0.7 units/ml Monitor platelets  by anticoagulation protocol: Yes   Date Time HL Rate/Comment  9/21 1957 0.11 850/subtherapeutic  Plan:  Give 2150 units bolus x 1 Increase heparin infusion to 1050 units/hr Check HL in 8 hours  Continue to monitor H&H and platelets daily while on heparin infusion   Celene Squibb, PharmD Clinical Pharmacist 08/07/2023 8:44 PM

## 2023-08-07 NOTE — Assessment & Plan Note (Signed)
Patient is on Praluent 75 mg into the muscle every 14 days, her last dose was 08/02/23

## 2023-08-07 NOTE — Assessment & Plan Note (Signed)
Spironolactone 25 mg daily, Entresto 49-51 mg p.o. twice daily, metoprolol succinate 200 mg daily, isosorbide mononitrate 120 mg daily, furosemide 40 mg daily were resumed on admission

## 2023-08-07 NOTE — ED Provider Notes (Signed)
[facility] Provider Note    Event Date/Time   First MD Initiated Contact with Patient 08/07/23 1004     (approximate)   History   Jaw Pain   HPI  Stacie Mcdonald is a 77 y.o. female past medical history significant for CAD with recent PCI with Dr. Juliann Pares 2 weeks ago who presents to the emergency department with chest and jaw pain.  Patient states that she started having significant chest and jaw pain last night.  Called her cardiologist today and was told to come to the emergency department.  Took aspirin 81 and nitroglycerin prior to arrival with improvement of her symptoms.  Ongoing jaw and chest pain at this time.  No history of DVT or PE.  No cough or shortness of breath.  Does endorse ongoing exertional chest since the procedure.     Physical Exam   Triage Vital Signs: ED Triage Vitals [08/07/23 0859]  Encounter Vitals Group     BP (!) 144/105     Systolic BP Percentile      Diastolic BP Percentile      Pulse Rate 88     Resp 20     Temp 98 F (36.7 C)     Temp Source Oral     SpO2 98 %     Weight 178 lb (80.7 kg)     Height 5\' 4"  (1.626 m)     Head Circumference      Peak Flow      Pain Score 2     Pain Loc      Pain Education      Exclude from Growth Chart     Most recent vital signs: Vitals:   08/07/23 1100 08/07/23 1230  BP: 107/60 (!) 161/89  Pulse: 80 78  Resp: 19 18  Temp:    SpO2: 92% 98%    Physical Exam Constitutional:      Appearance: She is well-developed.  HENT:     Head: Atraumatic.  Eyes:     Conjunctiva/sclera: Conjunctivae normal.  Cardiovascular:     Rate and Rhythm: Regular rhythm.  Pulmonary:     Effort: No respiratory distress.  Abdominal:     General: There is no distension.     Tenderness: There is no abdominal tenderness.  Musculoskeletal:        General: Normal range of motion.     Cervical back: Normal range of motion.     Right lower leg: No edema.     Left lower leg: No edema.   Skin:    General: Skin is warm.     Capillary Refill: Capillary refill takes less than 2 seconds.  Neurological:     Mental Status: She is alert. Mental status is at baseline.     IMPRESSION / MDM / ASSESSMENT AND PLAN / ED COURSE  I reviewed the triage vital signs and the nursing notes.  Differential diagnosis including ACS, pneumonia, pericarditis, pulmonary embolism  EKG  I, Corena Herter, the attending physician, personally viewed and interpreted this ECG.   Rate: Normal  Rhythm: Normal sinus  Axis: Normal  Intervals: Normal  ST&T Change: None  No tachycardic or bradycardic dysrhythmias while on cardiac telemetry.  RADIOLOGY I independently reviewed imaging, my interpretation of imaging: Chest x-ray with no signs of pulmonary edema  LABS (all labs ordered are listed, but only abnormal results are displayed) Labs interpreted as -    Labs Reviewed  BASIC METABOLIC PANEL - Abnormal;  Notable for the following components:      Result Value   Glucose, Bld 103 (*)    All other components within normal limits  TROPONIN I (HIGH SENSITIVITY) - Abnormal; Notable for the following components:   Troponin I (High Sensitivity) 37 (*)    All other components within normal limits  TROPONIN I (HIGH SENSITIVITY) - Abnormal; Notable for the following components:   Troponin I (High Sensitivity) 58 (*)    All other components within normal limits  CBC  APTT  PROTIME-INR  HEPARIN LEVEL (UNFRACTIONATED)     MDM  Initial troponin elevated at 37.  Ongoing chest pain.  Patient was given aspirin and nitroglycerin.  Repeat troponin increased to 58.  Ongoing pain.  Consulted and discussed the patient's case with cardiology Dr. Welton Flakes, recommended aspirin, heparin and Nitropaste.  Lower suspicion for pulmonary embolism do not feel like CTA is necessary at this time.  No signs of pulmonary edema.  Repeat EKG obtained given ongoing chest pain.  Given fluid bolus given slightly soft blood  pressure after nitro glycerin.  Consulted and discussed the patient's case with hospitalist, admitted for NSTEMI.     PROCEDURES:  Critical Care performed: yes  .Critical Care  Performed by: Corena Herter, MD Authorized by: Corena Herter, MD   Critical care provider statement:    Critical care time (minutes):  45   Critical care time was exclusive of:  Separately billable procedures and treating other patients   Critical care was necessary to treat or prevent imminent or life-threatening deterioration of the following conditions:  Cardiac failure   Critical care was time spent personally by me on the following activities:  Development of treatment plan with patient or surrogate, discussions with consultants, evaluation of patient's response to treatment, examination of patient, ordering and review of laboratory studies, ordering and review of radiographic studies, ordering and performing treatments and interventions, pulse oximetry, re-evaluation of patient's condition and review of old charts   Patient's presentation is most consistent with acute presentation with potential threat to life or bodily function.   MEDICATIONS ORDERED IN ED: Medications  nitroGLYCERIN (NITROSTAT) SL tablet 0.4 mg (0.4 mg Sublingual Given 08/07/23 1052)  heparin ADULT infusion 100 units/mL (25000 units/251mL) (has no administration in time range)  heparin bolus via infusion 4,000 Units (has no administration in time range)  acetaminophen (TYLENOL) tablet 650 mg (has no administration in time range)    Or  acetaminophen (TYLENOL) suppository 650 mg (has no administration in time range)  ondansetron (ZOFRAN) tablet 4 mg (has no administration in time range)    Or  ondansetron (ZOFRAN) injection 4 mg (has no administration in time range)  senna-docusate (Senokot-S) tablet 1 tablet (has no administration in time range)  sodium chloride 0.9 % bolus 500 mL (has no administration in time range)  aspirin  chewable tablet 324 mg (324 mg Oral Given 08/07/23 1051)  nitroGLYCERIN (NITROGLYN) 2 % ointment 1 inch (1 inch Topical Given 08/07/23 1213)    FINAL CLINICAL IMPRESSION(S) / ED DIAGNOSES   Final diagnoses:  NSTEMI (non-ST elevated myocardial infarction) (HCC)     Rx / DC Orders   ED Discharge Orders     None        Note:  This document was prepared using Dragon voice recognition software and may include unintentional dictation errors.   Corena Herter, MD 08/07/23 1234

## 2023-08-07 NOTE — Progress Notes (Signed)
Received message from Dr. Juliann Pares.  He will come by to see patient and will consult on patient. Patient is in agreement with this.  Staff message sent to Dr. Val Eagle' Jennette Kettle and [facility] pool canceling [facility] Cardiology consultation.   Dr. Sedalia Muta

## 2023-08-07 NOTE — Assessment & Plan Note (Signed)
Patient is status post left heart cath on 06/25/2023, per cardiac cath note successful PCI to mid RCA and distal RCA There is extensive in-stent restenosis at the proximal stent that did not receive intervention on 8/9

## 2023-08-07 NOTE — Consult Note (Signed)
Brief cardiology consult note  Impression Unstable angina Hypertension History of non-STEMI Dyspnea on exertion Hyperlipidemia History of PCI and stent GERD Obstructive sleep apnea Mild obesity Mild systolic cardiomyopathy History of recent PCI of in-stent restenosis with balloon angioplasty  Plan Agree to admit rule out myocardial infarction Follow-up troponins and EKGs This does not represent a non-STEMI at this point/this is more indicative of demand ischemia Recommend antianginal therapy nitrates heparin aspirin Plavix beta-blockers Status post PCI of in-stent restenosis August 8 complex procedure multiple stents may need complex intervention in the future Continue hypertension management and control Agree with purulent therapy for hyperlipidemia Imdur 120 mg a day/metoprolol 200 mg a day Ranexa Entresto Maintain anticoagulation with heparin Patient will probably we need repeat cardiac catheter for evaluation of coronary anatomy Will consider transferring to a tertiary care center at some point as this may require complex intervention  Full consult note to follow [facility] Cardiology

## 2023-08-07 NOTE — Plan of Care (Signed)

## 2023-08-07 NOTE — ED Notes (Addendum)
Pt placed in a pink "do not touch extremity armband" on R. Arm d/t mastectomy on R. side

## 2023-08-07 NOTE — ED Notes (Addendum)
Jaw and neck pain. Has a Hx of angioplasty procedure with stent placement d/t CAD almost 1 month ago. Severe 10/10 pain when it occurs, right now states its a 2/10. Has felt relief with angioplasty until recent pain episodes starting last week.

## 2023-08-07 NOTE — Consult Note (Signed)
ANTICOAGULATION CONSULT NOTE - Initial Consult  Pharmacy Consult for Heparin Indication: chest pain/ACS  Patient Measurements: Height: 5\' 4"  (162.6 cm) Weight: 80.7 kg (178 lb) IBW/kg (Calculated) : 54.7 Heparin Dosing Weight: 72.1 kg   Labs: Recent Labs    08/07/23 0928  HGB 14.5  HCT 43.8  PLT 254  CREATININE 0.77  TROPONINIHS 37*   Medical History: Past Medical History:  Diagnosis Date   Allergy    Arthritis    fingers, knees   Asthma    Breast Cancer 1991   R mastectomy. XRT , chemo   Breast cancer (HCC) 1991   RT MASTECTOMY   CAD (coronary artery disease) 2008   50% LAD occlusion 2009,  stent in mid LAD 2008 for 95%    Chicken pox    Colon polyps 02/2008   by Colonoscopy, [place]   GERD (gastroesophageal reflux disease)    Heart disease    Hyperlipidemia    Hypertension    Myocardial infarction (HCC)    silent. 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 She recently underwent an angioplasty procedure with stent placement d/t CAD about 1 month ago. She has been taking nitroglycerin as needed with minimal relief.   Goal of Therapy:  Heparin level 0.3-0.7 units/ml Monitor platelets by anticoagulation protocol: Yes   Plan:  Give 4000 unit bolus Start heparin infusion at 850 units/hr Check HL 8 hours after initiation of infusion and daily while on heparin  Continue to monitor H/H and platelets   Littie Deeds, PharmD PGY1 Pharmacy Resident  08/07/2023,11:58  AM

## 2023-08-07 NOTE — Assessment & Plan Note (Signed)
-  Trazodone 150 mg nightly resumed

## 2023-08-07 NOTE — Assessment & Plan Note (Signed)
Patient with intermittent typical chest pain, troponin barely positive with a flat curve, extensive CAD, cardiology is on board, unlikely NSTEMI, mostly demand ischemia and concern of unstable angina.  Cardiology is on board Continue heparin per pharmacy Complete echo ordered Patient likely need transfer to a tertiary care center due to concern of needing complicated PCI. Continue with supportive care

## 2023-08-07 NOTE — H&P (Signed)
History and Physical   Stacie Mcdonald URK:[id number] DOB: 01/18/1946 DOA: 08/07/2023  PCP: Sherlene Shams, MD  Outpatient Specialists: Dr. Olevia Perches clinic cardiology Patient coming from: Home  I have personally briefly reviewed patient's old medical records in [facility] EMR.  Chief Concern: Chest pain  HPI: Stacie Mcdonald is a 77 year old female with history of CAD status post PCI, hypertension, angina on Ranexa, GERD, who presents emergency department for chief concerns of chest pain.  Vitals in the ED showed temperature of 98, respiration rate of 20, heart rate of 80, blood pressure 144/105, SpO2 of 97% on room air.  Serum sodium of 138, potassium 3.9, chloride 104, bicarb 23, BUN of 17, serum creatinine 0.77, EGFR greater than 60, nonfasting blood glucose 103, WBC 7.2, hemoglobin 14.5, platelets of 254.  High sensitive troponin was 27 and on repeat is 58.  ED treatment: Aspirin 324 mg p.o. one-time dose, nitroglycerin 1 inch.  EDP consulted cardiology, Dr. Welton Flakes, who states that he will see the patient. --------------------------------- At bedside, patient she is able to tell me her name, her age, current location, and current year.  She developed chest pain on 9/20, that woke her up from sleep in the morning, she took three SL nitroglycerin that improved her symptoms. Throughout the day, it would come back and she took SL NG. She took ranexa and then she went to bed. She woke up at midnight this AM and 4 am from the pain.  She describes the dull pain, radiates to jaw and neck. She denies raditation to her extremeties.   She denies abnormal taste in her . She endorses worsening shortness of breath with exretion from her baseline shorntess of breath with exertion.   She reports this feels similar to prior episodes of cardiac NSTEMI requiring PCI.   At the peak, it is 8-10 and now it is a 4/10.  She reports her symptoms overall have improved significantly.  Social  history: She denies current and history of tobacco, recreational drug use. She infrequently uses wine. She lives with her husband of 56 years.  She is retired and formerly was a Loss adjuster, chartered.  She worked in the [employer] at [employer].  ROS: Constitutional: no weight change, no fever ENT/Mouth: no sore throat, no rhinorrhea Eyes: no eye pain, no vision changes Cardiovascular: + chest pain, + dyspnea,  no edema, no palpitations Respiratory: no cough, no sputum, no wheezing Gastrointestinal: no nausea, no vomiting, no diarrhea, no constipation Genitourinary: no urinary incontinence, no dysuria, no hematuria Musculoskeletal: no arthralgias, no myalgias Skin: no skin lesions, no pruritus, Neuro: + weakness, no loss of consciousness, no syncope Psych: no anxiety, no depression, no decrease appetite Heme/Lymph: no bruising, no bleeding  ED Course: Discussed with emergency medicine provider, patient requiring hospitalization for chief concerns of NSTEMI  Assessment/Plan  Principal Problem:   NSTEMI (non-ST elevated myocardial infarction) (HCC) Active Problems:   CAD (coronary artery disease)   Hyperlipidemia with target low density lipoprotein (LDL) cholesterol less than 70 mg/dL   Essential hypertension   Insomnia due to stress   Disorder of diaphragm   S/P drug eluting coronary stent placement   Status post coronary angioplasty   Assessment and Plan:  * NSTEMI (non-ST elevated myocardial infarction) (HCC) Continue heparin per pharmacy Complete echo ordered Inpatient cardiology consultation for [facility] cardiology, Dr. Flora Lipps at patient's request Heart healthy diet Nitroglycerin ointment 1 inch, every 6 hours AM team to anticipate n.p.o. on midnight on 9/22 possible cardiac  cath on 08/09/2023 Admit to telemetry cardiac, inpatient  S/P drug eluting coronary stent placement Patient is status post left heart cath on 06/25/2023, per cardiac cath note successful PCI to mid RCA and distal  RCA There is extensive in-stent restenosis at the proximal stent that did not receive intervention on 8/9  Disorder of diaphragm Incentive spirometry and flutter valve every 2 hours while awake  Insomnia due to stress Trazodone 150 mg nightly resumed  Essential hypertension Spironolactone 25 mg daily, Entresto 49-51 mg p.o. twice daily, metoprolol succinate 200 mg daily, isosorbide mononitrate 120 mg daily, furosemide 40 mg daily were resumed on admission  Hyperlipidemia with target low density lipoprotein (LDL) cholesterol less than 70 mg/dL Patient is on Praluent 75 mg into the muscle every 14 days, her last dose was 08/02/23  CAD (coronary artery disease) Home Imdur 120 milligrams daily, furosemide 40 mg daily, metoprolol succinate 200 mg daily, spironolactone 25 mg daily, Entresto 49-51 mg p.o. twice daily, Ranexa 500 mg p.o. twice daily were resumed on admission  Chart reviewed.   DVT prophylaxis: Heparin per pharmacy Code Status: Full code Diet: Heart healthy Family Communication: Attempted to call husband at [phone], no pickup.  I attempted to call [phone], no pickup Disposition Plan: Pending clinical course Consults called: Cardiology, Dr. Flora Lipps at the request of patient Admission status: Telemetry cardiac, inpatient  Past Medical History:  Diagnosis Date   Allergy    Arthritis    fingers, knees   Asthma    Breast Cancer 1991   R mastectomy. 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    She  successful ablation of the pseudoaneurysm   Rheumatic fever    SCC (squamous cell carcinoma), leg, right 01/09/2018   Sleep apnea    CPAP   Stable angina 07/27/2019   Thoracic vertebral fracture (HCC) 04/05/2012   Urinary tract infection    Wears contact lenses    left only   Past Surgical History:  Procedure Laterality Date   BREAST BIOPSY     right breast reconstruction S/P mastectomy   BREAST SURGERY     right mastectomy   CATARACT EXTRACTION W/PHACO Left 05/01/2020   Procedure: CATARACT EXTRACTION PHACO AND INTRAOCULAR LENS PLACEMENT (IOC) LEFT 5.45  00:48.6  11.2%;  Surgeon: Lockie Mola, MD;  Location: [facility];  Service: Ophthalmology;  Laterality: Left;  sleep apnea   COLONOSCOPY WITH PROPOFOL N/A 08/18/2019   Procedure: COLONOSCOPY WITH PROPOFOL;  Surgeon: Midge Minium, MD;  Location: [facility];  Service: Endoscopy;  Laterality: N/A;  sleep apnea   CORONARY ANGIOPLASTY WITH STENT PLACEMENT  Jan 2013   mid RCA 75% occlusion resolved to 0%, [place]   CORONARY STENT INTERVENTION N/A 04/07/2018   Procedure: CORONARY STENT INTERVENTION;  Surgeon: Alwyn Pea, MD;  Location: [facility] INVASIVE CV LAB;  Service: Cardiovascular;  Laterality: N/A;   CORONARY STENT INTERVENTION N/A 02/20/2021   Procedure: CORONARY STENT  INTERVENTION;  Surgeon: Alwyn Pea, MD;  Location: [facility] CV [facility];  Service: Cardiovascular;  Laterality: N/A;   CORONARY STENT INTERVENTION N/A 06/25/2023   Procedure: CORONARY STENT INTERVENTION;  Surgeon: Alwyn Pea, MD;  Location: [facility];  Service: Cardiovascular;  Laterality: N/A;   LEFT HEART CATH AND CORONARY ANGIOGRAPHY N/A 02/20/2021   Procedure: LEFT HEART CATH AND CORONARY ANGIOGRAPHY;  Surgeon: Alwyn Pea, MD;  Location: [facility];  Service: Cardiovascular;  Laterality: N/A;   LEFT HEART CATH AND CORONARY ANGIOGRAPHY Left 06/25/2023   Procedure: LEFT HEART CATH AND CORONARY  ANGIOGRAPHY;  Surgeon: Alwyn Pea, MD;  Location: [facility];  Service: Cardiovascular;  Laterality: Left;   MASTECTOMY Right 1991   BREAST CA   POLYPECTOMY  08/18/2019   Procedure: POLYPECTOMY;  Surgeon: Midge Minium, MD;  Location: [facility];  Service: Endoscopy;;   PSEUDOANERYSM COMPRESSION N/A 02/27/2021   Procedure: Chiquita Loth COMPRESSION;  Surgeon: Renford Dills, MD;  Location: [facility];  Service: Cardiovascular;  Laterality: N/A;   RIGHT/LEFT HEART CATH AND CORONARY ANGIOGRAPHY N/A 04/07/2018   Procedure: RIGHT/LEFT HEART CATH AND CORONARY ANGIOGRAPHY;  Surgeon: Alwyn Pea, MD;  Location: [facility] CV [facility];  Service: Cardiovascular;  Laterality: N/A;   TONSILLECTOMY     Social History:  reports that she has never smoked. She has never used smokeless tobacco. She reports current alcohol use of about 1.0 standard drink of alcohol per week. She reports that she does not use drugs.  Allergies  Allergen Reactions   Penicillins Hives    Has patient had a PCN reaction causing immediate rash, facial/tongue/throat swelling, SOB or lightheadedness with hypotension: No Has patient had a PCN reaction causing severe rash involving mucus membranes or skin necrosis: Yes Has patient had a PCN reaction that required hospitalization: No Has patient had a PCN reaction occurring within the last 10 years: Yes If all of the above answers are "NO", then may proceed with Cephalosporin use.    Statins     Other reaction(s): Muscle Pain   Family History  Problem Relation Age of Onset   Heart disease Mother 54       massive MI   Heart disease Father 25       AMI   Cancer Father        metastatic lung CA   Diabetes Sister    Cancer Maternal Aunt    Breast cancer Maternal Aunt 76   Breast cancer Cousin        44s   Breast cancer Cousin        62s   Family history: Family history reviewed and not pertinent.  Prior to Admission medications    Medication Sig Start Date End Date Taking? Authorizing Provider  albuterol (VENTOLIN HFA) 108 (90 Base) MCG/ACT inhaler Inhale 2 puffs into the lungs every 6 (six) hours as needed. Using in Am daily  per MD 07/01/23 06/30/24  [provider]  aspirin 81 MG tablet Take 81 mg by mouth daily.    [provider]  clopidogrel (PLAVIX) 75 MG tablet TAKE ONE TABLET BY MOUTH DAILY Patient taking differently: Take 75 mg by mouth daily. 12/25/13   Sherlene Shams, MD  D-MANNOSE PO Take 800 mg by mouth daily.    [provider]  ELDERBERRY PO Take 1,000 mg by mouth daily.    [provider]  empagliflozin (JARDIANCE) 10 MG TABS tablet Take 1  tablet (10 mg total) by mouth daily before breakfast. 05/18/23   Sherlene Shams, MD  furosemide (LASIX) 40 MG tablet Take 40 mg by mouth daily.    [provider]  GLUCOSAMINE-CHONDROITIN PO Take 2 tablets by mouth daily.    [provider]  isosorbide mononitrate (IMDUR) 120 MG 24 hr tablet Take 120 mg by mouth daily.     [provider]  loratadine (CLARITIN) 10 MG tablet Take 10 mg by mouth daily as needed for allergies.    [provider]  metoprolol (TOPROL-XL) 200 MG 24 hr tablet TAKE 1 TABLET BY MOUTH DAILY. Patient taking differently: Take 200 mg by mouth daily. TAKE 1 TABLET BY MOUTH DAILY. 08/17/14   Sherlene Shams, MD  nitroGLYCERIN (NITROSTAT) 0.4 MG SL tablet Place 0.4 mg under the tongue every 5 (five) minutes as needed for chest pain.    [provider]  omeprazole (PRILOSEC) 20 MG capsule Take 1 capsule (20 mg total) by mouth daily. Patient taking differently: Take 20 mg by mouth daily as needed (acid reflux). 08/17/14   Sherlene Shams, MD  OVER THE COUNTER MEDICATION Take 50 mg by mouth daily. Blood sugar support with P[place]ogel Patient not taking: Reported on 06/25/2023    [provider]  PRALUENT 75 MG/ML SOAJ Inject 75 mg into the muscle every 14 (fourteen) days.  04/11/19   [provider]  ranolazine (RANEXA) 500 MG 12 hr tablet Take 500 mg by mouth 2 (two) times daily.    [provider]  sacubitril-valsartan (ENTRESTO) 49-51 MG Take 1 tablet by mouth 2 (two) times daily. 11/27/19   [provider]  spironolactone (ALDACTONE) 25 MG tablet Take 1 tablet (25 mg total) by mouth daily. 07/30/19   Sherlene Shams, MD  traZODone (DESYREL) 150 MG tablet Take 1 tablet (150 mg total) by mouth at bedtime. 03/05/23   Sherlene Shams, MD   Physical Exam: Vitals:   08/07/23 1100 08/07/23 1230 08/07/23 1328 08/07/23 1329  BP: 107/60 (!) 161/89 (!) 147/73 (!) 147/73  Pulse: 80 78 83 83  Resp: 19 18 17 17   Temp:   97.9 F (36.6 C) 97.9 F (36.6 C)  TempSrc:    Oral  SpO2: 92% 98% 93% 93%  Weight:      Height:       Constitutional: appears age-appropriate, NAD, calm Eyes: PERRL, lids and conjunctivae normal ENMT: Mucous membranes are moist. Posterior pharynx clear of any exudate or lesions. Age-appropriate dentition. Hearing appropriate Neck: normal, supple, no masses, no thyromegaly Respiratory: clear to auscultation bilaterally, no wheezing, no crackles. Normal respiratory effort. No accessory muscle use.  Cardiovascular: Regular rate and rhythm, no murmurs / rubs / gallops. No extremity edema. 2+ pedal pulses. No carotid bruits.  Abdomen: no tenderness, no masses palpated, no hepatosplenomegaly. Bowel sounds positive.  Musculoskeletal: no clubbing / cyanosis. No joint deformity upper and lower extremities. Good ROM, no contractures, no atrophy. Normal muscle tone.  Skin: no rashes, lesions, ulcers. No induration Neurologic: Sensation intact. Strength 5/5 in all 4.  Psychiatric: Normal judgment and insight. Alert and oriented x 3. Normal mood.   EKG: independently reviewed, showing sinus rhythm with rate of 86, QTc 493, incomplete right bundle branch block.  Chest x-ray on Admission: I personally reviewed and I agree with  radiologist reading as below.  DG Chest 2 View  Result Date: 08/07/2023 CLINICAL DATA:  Chest pain radiating to neck. Shortness of breath. Coronary artery disease. EXAM: CHEST -  2 VIEW COMPARISON:  03/30/2012 FINDINGS: The heart size and mediastinal contours are within normal limits. Chronic elevation of right hemidiaphragm is again seen. Both lungs are clear. Surgical clips again seen in the right anterior chest wall. IMPRESSION: No active cardiopulmonary disease. Chronic elevation of right hemidiaphragm. Electronically Signed   By: Danae Orleans M.D.   On: 08/07/2023 09:22    Labs on Admission: I have personally reviewed following labs CBC: Recent Labs  Lab 08/07/23 0928  WBC 7.2  HGB 14.5  HCT 43.8  MCV 92.0  PLT 254   Basic Metabolic Panel: Recent Labs  Lab 08/07/23 0928  NA 138  K 3.9  CL 104  CO2 23  GLUCOSE 103*  BUN 17  CREATININE 0.77  CALCIUM 9.0   GFR: Estimated Creatinine Clearance: 59.6 mL/min (by C-G formula based on SCr of 0.77 mg/dL).  Coagulation Profile: Recent Labs  Lab 08/07/23 1218  INR 1.0   This document was prepared using Dragon Voice Recognition software and may include unintentional dictation errors.  Dr. Sedalia Muta [facility]  If 7PM-7AM, please contact overnight-coverage provider If 7AM-7PM, please contact day attending provider [email or web address]  08/07/2023, 4:06 PM

## 2023-08-07 NOTE — Assessment & Plan Note (Signed)
Home Imdur 120 milligrams daily, furosemide 40 mg daily, metoprolol succinate 200 mg daily, spironolactone 25 mg daily, Entresto 49-51 mg p.o. twice daily, Ranexa 500 mg p.o. twice daily were resumed on admission

## 2023-08-07 NOTE — Assessment & Plan Note (Addendum)
Incentive spirometry and flutter valve every 2 hours while awake

## 2023-08-07 NOTE — Hospital Course (Signed)
Ms. Emmalena Swaziland is a 77 year old female with history of CAD status post PCI, hypertension, angina on Ranexa, GERD, who presents emergency department for chief concerns of chest pain.  Vitals in the ED showed temperature of 98, respiration rate of 20, heart rate of 80, blood pressure 144/105, SpO2 of 97% on room air.  Serum sodium of 138, potassium 3.9, chloride 104, bicarb 23, BUN of 17, serum creatinine 0.77, EGFR greater than 60, nonfasting blood glucose 103, WBC 7.2, hemoglobin 14.5, platelets of 254.  High sensitive troponin was 27 and on repeat is 58.  ED treatment: Aspirin 324 mg p.o. one-time dose, nitroglycerin 1 inch.  EDP consulted cardiology, Dr. Welton Flakes, who states that he will see the patient.

## 2023-08-08 DIAGNOSIS — J986 Disorders of diaphragm: Secondary | ICD-10-CM

## 2023-08-08 DIAGNOSIS — I10 Essential (primary) hypertension: Secondary | ICD-10-CM | POA: Diagnosis not present

## 2023-08-08 DIAGNOSIS — E785 Hyperlipidemia, unspecified: Secondary | ICD-10-CM | POA: Diagnosis not present

## 2023-08-08 DIAGNOSIS — I25118 Atherosclerotic heart disease of native coronary artery with other forms of angina pectoris: Secondary | ICD-10-CM | POA: Diagnosis not present

## 2023-08-08 DIAGNOSIS — F5102 Adjustment insomnia: Secondary | ICD-10-CM

## 2023-08-08 LAB — BASIC METABOLIC PANEL WITH GFR
Anion gap: 8 (ref 5–15)
BUN: 21 mg/dL (ref 8–23)
CO2: 24 mmol/L (ref 22–32)
Calcium: 8.9 mg/dL (ref 8.9–10.3)
Chloride: 107 mmol/L (ref 98–111)
Creatinine, Ser: 0.74 mg/dL (ref 0.44–1.00)
GFR, Estimated: >60 mL/min
Glucose, Bld: 114 mg/dL — ABNORMAL HIGH (ref 70–99)
Potassium: 3.7 mmol/L (ref 3.5–5.1)
Sodium: 139 mmol/L (ref 135–145)

## 2023-08-08 LAB — CBC
HCT: 39.6 % (ref 36.0–46.0)
Hemoglobin: 13.5 g/dL (ref 12.0–15.0)
MCH: 30.8 pg (ref 26.0–34.0)
MCHC: 34.1 g/dL (ref 30.0–36.0)
MCV: 90.4 fL (ref 80.0–100.0)
Platelets: 217 10*3/uL (ref 150–400)
RBC: 4.38 MIL/uL (ref 3.87–5.11)
RDW: 13.3 % (ref 11.5–15.5)
WBC: 7 10*3/uL (ref 4.0–10.5)
nRBC: 0 % (ref 0.0–0.2)

## 2023-08-08 LAB — HEPARIN LEVEL (UNFRACTIONATED)
Heparin Unfractionated: 0.4 IU/mL (ref 0.30–0.70)
Heparin Unfractionated: 0.28 [IU]/mL — ABNORMAL LOW (ref 0.30–0.70)

## 2023-08-08 LAB — TROPONIN I (HIGH SENSITIVITY)
Troponin I (High Sensitivity): 46 ng/L — ABNORMAL HIGH (ref <18)
Troponin I (High Sensitivity): 55 ng/L — ABNORMAL HIGH (ref <18)

## 2023-08-08 MED ORDER — RANOLAZINE ER 500 MG PO TB12
1000.0000 mg | ORAL_TABLET | Freq: Two times a day (BID) | ORAL | Status: DC
  Administered 2023-08-09: 1000 mg via ORAL
  Filled 2023-08-08: qty 2

## 2023-08-08 MED ORDER — HEPARIN BOLUS VIA INFUSION
1000.0000 [IU] | Freq: Once | INTRAVENOUS | Status: AC
  Administered 2023-08-08: 1000 [IU] via INTRAVENOUS
  Filled 2023-08-08: qty 1000

## 2023-08-08 MED ORDER — NITROGLYCERIN 0.4 MG SL SUBL: 0.4000 mg | SUBLINGUAL_TABLET | SUBLINGUAL | Status: DC | PRN

## 2023-08-08 MED ORDER — MORPHINE SULFATE (PF) 2 MG/ML IV SOLN
2.0000 mg | INTRAVENOUS | Status: DC | PRN
  Administered 2023-08-08 – 2023-08-09 (×3): 2 mg via INTRAVENOUS
  Filled 2023-08-08 (×3): qty 1

## 2023-08-08 NOTE — Progress Notes (Addendum)
Patient ID: Stacie Mcdonald, female   DOB: 1946-04-09, 77 y.o.   MRN: [id number] [facility] Cardiology    SUBJECTIVE: Patient seems to be doing reasonably well now had an episode of unstable angina last night which she states occurs around 4 AM it took a while to get an additional dose of Nitropaste she has been having this on and off for the last several days   Vitals:   08/07/23 2325 08/08/23 0359 08/08/23 0729 08/08/23 1636  BP: (!) 113/54 102/75 107/64 112/62  Pulse: 76 80 69 91  Resp: 18 18 18 18   Temp: 97.9 F (36.6 C) 98.5 F (36.9 C) 97.8 F (36.6 C) 99.1 F (37.3 C)  TempSrc:   Oral   SpO2: 97% 93% 93% 93%  Weight:      Height:         Intake/Output Summary (Last 24 hours) at 08/08/2023 2250 Last data filed at 08/08/2023 2228 Gross per 24 hour  Intake 243 ml  Output --  Net 243 ml      PHYSICAL EXAM  General: Well developed, well nourished, in no acute distress HEENT:  Normocephalic and atramatic Neck:  No JVD.  Lungs: Clear bilaterally to auscultation and percussion. Heart: HRRR . Normal S1 and S2 without gallops or murmurs.  Abdomen: Bowel sounds are positive, abdomen soft and non-tender  Msk:  Back normal, normal gait. Normal strength and tone for age. Extremities: No clubbing, cyanosis or edema.   Neuro: Alert and oriented X 3. Psych:  Good affect, responds appropriately   LABS: Basic Metabolic Panel: Recent Labs    08/07/23 0928 08/08/23 0529  NA 138 139  K 3.9 3.7  CL 104 107  CO2 23 24  GLUCOSE 103* 114*  BUN 17 21  CREATININE 0.77 0.74  CALCIUM 9.0 8.9   Liver Function Tests: No results for input(s): "AST", "ALT", "ALKPHOS", "BILITOT", "PROT", "ALBUMIN" in the last 72 hours. No results for input(s): "LIPASE", "AMYLASE" in the last 72 hours. CBC: Recent Labs    08/07/23 0928 08/08/23 0529  WBC 7.2 7.0  HGB 14.5 13.5  HCT 43.8 39.6  MCV 92.0 90.4  PLT 254 217   Cardiac Enzymes: No results for input(s): "CKTOTAL", "CKMB", "CKMBINDEX",  "TROPONINI" in the last 72 hours. BNP: Invalid input(s): "POCBNP" D-Dimer: No results for input(s): "DDIMER" in the last 72 hours. Hemoglobin A1C: No results for input(s): "HGBA1C" in the last 72 hours. Fasting Lipid Panel: No results for input(s): "CHOL", "HDL", "LDLCALC", "TRIG", "CHOLHDL", "LDLDIRECT" in the last 72 hours. Thyroid Function Tests: No results for input(s): "TSH", "T4TOTAL", "T3FREE", "THYROIDAB" in the last 72 hours.  Invalid input(s): "FREET3" Anemia Panel: No results for input(s): "VITAMINB12", "FOLATE", "FERRITIN", "TIBC", "IRON", "RETICCTPCT" in the last 72 hours.  DG Chest 2 View  Result Date: 08/07/2023 CLINICAL DATA:  Chest pain radiating to neck. Shortness of breath. Coronary artery disease. EXAM: CHEST - 2 VIEW COMPARISON:  03/30/2012 FINDINGS: The heart size and mediastinal contours are within normal limits. Chronic elevation of right hemidiaphragm is again seen. Both lungs are clear. Surgical clips again seen in the right anterior chest wall. IMPRESSION: No active cardiopulmonary disease. Chronic elevation of right hemidiaphragm. Electronically Signed   By: Danae Orleans M.D.   On: 08/07/2023 09:22     Echo pending  TELEMETRY: Normal sinus rhythm nonspecific ST-T wave changes:  ASSESSMENT AND PLAN:  Principal Problem:   Unstable angina (HCC) Active Problems:   CAD (coronary artery disease)   Hyperlipidemia with target  low density lipoprotein (LDL) cholesterol less than 70 mg/dL   Essential hypertension   Insomnia due to stress   Disorder of diaphragm   S/P drug eluting coronary stent placement    1.  Unstable angina agree with to telemetry follow-up EKGs follow-up troponins Continue anticoagulation with IV heparin patient also on aspirin and Plavix Status post recent PCI of in-stent restenosis in August now with recurrent symptoms would recommend invasive evaluation with possible complex intervention We will try to arrange transfer to tertiary care  facility for further assessment evaluation of in-stent restenosis patient may benefit from drug coated balloon intervention rather than additional stent placement Hyperlipidemia continue statin therapy for lipid management Hypertension reasonably controlled patient currently on Entresto spironolactone metoprolol Imdur Unstable angina continue Ranexa Imdur aspirin Plavix heparin metoprolol Mild obesity recommend modest weight loss exercise portion control Lower extremity edema and swelling with shortness of breath continue Lasix therapy Patient preop for cardiac cath will try to reach have patient transferred so that diagnostic as well as potential intervention could be done at a tertiary care facility    Alwyn Pea, MD, 08/08/2023 10:50 PM

## 2023-08-08 NOTE — Plan of Care (Signed)

## 2023-08-08 NOTE — Progress Notes (Signed)
Progress Note   Patient: Stacie Mcdonald RUE:[id number] DOB: 05/06/1946 DOA: 08/07/2023     1 DOS: the patient was seen and examined on 08/08/2023   Brief hospital course: Ms. Stacie Mcdonald is a 77 year old female with history of CAD status post PCI, hypertension, angina on Ranexa, GERD, who presents emergency department for chief concerns of chest pain.  Patient is status post left heart cath on 06/25/2023, per cardiac cath note successful PCI to mid RCA and distal RCA. There is extensive in-stent restenosis at the proximal stent that did not receive intervention on 8/9.  Vitals in the ED showed temperature of 98, respiration rate of 20, heart rate of 80, blood pressure 144/105, SpO2 of 97% on room air.  Serum sodium of 138, potassium 3.9, chloride 104, bicarb 23, BUN of 17, serum creatinine 0.77, EGFR greater than 60, nonfasting blood glucose 103, WBC 7.2, hemoglobin 14.5, platelets of 254. High sensitive troponin was 27 and on repeat is 58. EKG with sinus rhythm, multiple PVCs and RBBB, which seems chronic.  ED treatment: Aspirin 324 mg p.o. one-time dose, nitroglycerin 1 inch.   9/22: Vital and labs stable.  Dr. Juliann Pares saw the patient and she might need a complex procedure requiring multiple stents in the future due to extensive cardiac disease.  She does not look like an NSTEMI at this point due to barely positive troponin,37>>58>>46. She was started on heparin infusion by cardiology based on her risk factors, will continue trending troponin.    Assessment and Plan: * Unstable angina [facility]) Patient with intermittent typical chest pain, troponin barely positive with a flat curve, extensive CAD, cardiology is on board, unlikely NSTEMI, mostly demand ischemia and concern of unstable angina.  Cardiology is on board Continue heparin per pharmacy Complete echo ordered Patient likely need transfer to a tertiary care center due to concern of needing complicated PCI. Continue with supportive  care  CAD (coronary artery disease) Home Imdur 120 milligrams daily, furosemide 40 mg daily, metoprolol succinate 200 mg daily, spironolactone 25 mg daily, Entresto 49-51 mg p.o. twice daily, Ranexa 500 mg p.o. twice daily were resumed on admission  S/P drug eluting coronary stent placement Patient is status post left heart cath on 06/25/2023, per cardiac cath note successful PCI to mid RCA and distal RCA There is extensive in-stent restenosis at the proximal stent that did not receive intervention on 8/9  Essential hypertension Spironolactone 25 mg daily, Entresto 49-51 mg p.o. twice daily, metoprolol succinate 200 mg daily, isosorbide mononitrate 120 mg daily, furosemide 40 mg daily were resumed on admission  Hyperlipidemia with target low density lipoprotein (LDL) cholesterol less than 70 mg/dL Patient is on Praluent 75 mg into the muscle every 14 days, her last dose was 08/02/23  Disorder of diaphragm Incentive spirometry and flutter valve every 2 hours while awake  Insomnia due to stress Trazodone 150 mg nightly resumed   Subjective: Patient had another episode of chest pain around 4 AM this morning.  No chest pain at the time of rounding.   Physical Exam: Vitals:   08/07/23 2010 08/07/23 2325 08/08/23 0359 08/08/23 0729  BP: (!) 132/57 (!) 113/54 102/75 107/64  Pulse: (!) 44 76 80 69  Resp: 18 18 18 18   Temp: 98.9 F (37.2 C) 97.9 F (36.6 C) 98.5 F (36.9 C) 97.8 F (36.6 C)  TempSrc: Oral   Oral  SpO2: 95% 97% 93% 93%  Weight:      Height:       General.  Well-developed elderly lady, in no acute distress. Pulmonary.  Lungs clear bilaterally, normal respiratory effort. CV.  Regular rate and rhythm, no JVD, rub or murmur. Abdomen.  Soft, nontender, nondistended, BS positive. CNS.  Alert and oriented .  No focal neurologic deficit. Extremities.  No edema, no cyanosis, pulses intact and symmetrical. Psychiatry.  Judgment and insight appears normal.   Data  Reviewed: Prior data reviewed  Family Communication: Discussed with daughter at bedside  Disposition: Status is: Inpatient Remains inpatient appropriate because: Severity of illness  Planned Discharge Destination:  To be determined  DVT prophylaxis.  Heparin GTT Time spent: 45 minutes  This record has been created using Conservation officer, historic buildings. Errors have been sought and corrected,but may not always be located. Such creation errors do not reflect on the standard of care.   Author: Arnetha Courser, MD 08/08/2023 11:47 AM  For on call review [email or web address].

## 2023-08-08 NOTE — Consult Note (Signed)
ANTICOAGULATION CONSULT NOTE  Pharmacy Consult for Heparin Infusion Indication: chest pain/ACS  Patient Measurements: Height: 5\' 4"  (162.6 cm) Weight: 80.7 kg (178 lb) IBW/kg (Calculated) : 54.7 Heparin Dosing Weight: 72.1 kg   Labs: Recent Labs    08/07/23 0928 08/07/23 1101 08/07/23 1218 08/07/23 1957 08/08/23 0529 08/08/23 0909 08/08/23 1130 08/08/23 1515  HGB 14.5  --   --   --  13.5  --   --   --   HCT 43.8  --   --   --  39.6  --   --   --   PLT 254  --   --   --  217  --   --   --   APTT  --   --  26  --   --   --   --   --   LABPROT  --   --  13.8  --   --   --   --   --   INR  --   --  1.0  --   --   --   --   --   HEPARINUNFRC  --   --   --  0.11* 0.40  --   --  0.28*  CREATININE 0.77  --   --   --  0.74  --   --   --   TROPONINIHS 37* 58*  --   --   --  46* 55*  --    Medical History: Past Medical History:  Diagnosis Date   Allergy    Arthritis    fingers, knees   Asthma    Breast Cancer 1991   R mastectomy. 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 female presenting with intermittent jaw and neck pain. PMH significant for CAD, HTN, HLD, T2DM, ILD. Patient was not on [facility]  PTA per chart review. Pharmacy has been consulted to initiate and manage heparin infusion.   Baseline Labs: aPTT 26, PT 13.8, INR 1.0, Hgb 14.5, Hct 43.8, Plt 254   Goal of Therapy:  Heparin level 0.3-0.7 units/ml Monitor platelets by anticoagulation protocol: Yes   Date Time HL Rate/Comment  9/21 1957 0.11 850/subtherapeutic 9/22 0529 0.40 1050/therapeutic x 1 9/22 1515 0.28 Subtherapeutic  Plan:  Heparin level subtherapeutic Give heparin 1000 units IV x 1 Increase heparin infusion rate to 1100 units/hr Check heparin level 8 hours after rate change Daily CBC while on heparin  Barrie Folk, PharmD 08/08/2023 4:25 PM

## 2023-08-08 NOTE — Consult Note (Signed)
ANTICOAGULATION CONSULT NOTE  Pharmacy Consult for Heparin Infusion Indication: chest pain/ACS  Patient Measurements: Height: 5\' 4"  (162.6 cm) Weight: 80.7 kg (178 lb) IBW/kg (Calculated) : 54.7 Heparin Dosing Weight: 72.1 kg   Labs: Recent Labs    08/07/23 0928 08/07/23 1101 08/07/23 1218 08/07/23 1957 08/08/23 0529  HGB 14.5  --   --   --  13.5  HCT 43.8  --   --   --  39.6  PLT 254  --   --   --  217  APTT  --   --  26  --   --   LABPROT  --   --  13.8  --   --   INR  --   --  1.0  --   --   HEPARINUNFRC  --   --   --  0.11* 0.40  CREATININE 0.77  --   --   --  0.74  TROPONINIHS 37* 58*  --   --   --    Medical History: Past Medical History:  Diagnosis Date   Allergy    Arthritis    fingers, knees   Asthma    Breast Cancer 1991   R mastectomy. 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 female presenting with intermittent jaw and neck pain. PMH significant for CAD, HTN, HLD, T2DM, ILD. Patient was not on [facility] PTA per chart review. Pharmacy has been consulted to initiate and manage heparin infusion.   Baseline Labs: aPTT 26, PT 13.8, INR 1.0, Hgb 14.5, Hct 43.8, Plt 254   Goal of  Therapy:  Heparin level 0.3-0.7 units/ml Monitor platelets by anticoagulation protocol: Yes   Date Time HL Rate/Comment  9/21 1957 0.11 850/subtherapeutic 9/22 0529 0.40 1050/therapeutic x 1  Plan:  Continue heparin infusion to 1050 units/hr Recheck HL in 8 hours to confirm Continue to monitor H&H and platelets daily while on heparin infusion   Otelia Sergeant, PharmD, [facility] 08/08/2023 6:01 AM

## 2023-08-09 ENCOUNTER — Inpatient Hospital Stay
Admit: 2023-08-09 | Discharge: 2023-08-09 | Disposition: A | Discharge status: Home / Self Care | Payer: Medicare Other | Attending: Internal Medicine

## 2023-08-09 ENCOUNTER — Ambulatory Visit: Admission: RE | Admit: 2023-08-09 | Payer: Medicare Other | Source: Home / Self Care | Admitting: Internal Medicine

## 2023-08-09 ENCOUNTER — Encounter: Admission: RE | Payer: Self-pay | Source: Home / Self Care

## 2023-08-09 ENCOUNTER — Encounter: Payer: Self-pay | Admitting: *Deleted

## 2023-08-09 DIAGNOSIS — I252 Old myocardial infarction: Secondary | ICD-10-CM | POA: Diagnosis not present

## 2023-08-09 DIAGNOSIS — I2510 Atherosclerotic heart disease of native coronary artery without angina pectoris: Secondary | ICD-10-CM | POA: Diagnosis not present

## 2023-08-09 DIAGNOSIS — I25118 Atherosclerotic heart disease of native coronary artery with other forms of angina pectoris: Secondary | ICD-10-CM | POA: Diagnosis not present

## 2023-08-09 DIAGNOSIS — Z7982 Long term (current) use of aspirin: Secondary | ICD-10-CM | POA: Diagnosis not present

## 2023-08-09 DIAGNOSIS — Z888 Allergy status to other drugs, medicaments and biological substances status: Secondary | ICD-10-CM | POA: Diagnosis not present

## 2023-08-09 DIAGNOSIS — E782 Mixed hyperlipidemia: Secondary | ICD-10-CM | POA: Diagnosis not present

## 2023-08-09 DIAGNOSIS — T82855A Stenosis of coronary artery stent, initial encounter: Secondary | ICD-10-CM | POA: Diagnosis not present

## 2023-08-09 DIAGNOSIS — I10 Essential (primary) hypertension: Secondary | ICD-10-CM | POA: Diagnosis not present

## 2023-08-09 DIAGNOSIS — Z79899 Other long term (current) drug therapy: Secondary | ICD-10-CM | POA: Diagnosis not present

## 2023-08-09 DIAGNOSIS — I214 Non-ST elevation (NSTEMI) myocardial infarction: Secondary | ICD-10-CM | POA: Diagnosis not present

## 2023-08-09 DIAGNOSIS — Z955 Presence of coronary angioplasty implant and graft: Secondary | ICD-10-CM | POA: Diagnosis not present

## 2023-08-09 DIAGNOSIS — I5032 Chronic diastolic (congestive) heart failure: Secondary | ICD-10-CM | POA: Diagnosis not present

## 2023-08-09 DIAGNOSIS — Z7902 Long term (current) use of antithrombotics/antiplatelets: Secondary | ICD-10-CM | POA: Diagnosis not present

## 2023-08-09 DIAGNOSIS — I209 Angina pectoris, unspecified: Secondary | ICD-10-CM | POA: Diagnosis not present

## 2023-08-09 DIAGNOSIS — I110 Hypertensive heart disease with heart failure: Secondary | ICD-10-CM | POA: Diagnosis not present

## 2023-08-09 DIAGNOSIS — Y831 Surgical operation with implant of artificial internal device as the cause of abnormal reaction of the patient, or of later complication, without mention of misadventure at the time of the procedure: Secondary | ICD-10-CM | POA: Diagnosis not present

## 2023-08-09 DIAGNOSIS — Z7984 Long term (current) use of oral hypoglycemic drugs: Secondary | ICD-10-CM | POA: Diagnosis not present

## 2023-08-09 DIAGNOSIS — I493 Ventricular premature depolarization: Secondary | ICD-10-CM | POA: Diagnosis not present

## 2023-08-09 DIAGNOSIS — Z885 Allergy status to narcotic agent status: Secondary | ICD-10-CM | POA: Diagnosis not present

## 2023-08-09 DIAGNOSIS — Z880 Allergy status to penicillin: Secondary | ICD-10-CM | POA: Diagnosis not present

## 2023-08-09 DIAGNOSIS — I21A9 Other myocardial infarction type: Secondary | ICD-10-CM | POA: Diagnosis not present

## 2023-08-09 DIAGNOSIS — I200 Unstable angina: Secondary | ICD-10-CM | POA: Diagnosis not present

## 2023-08-09 LAB — CBC
HCT: 38.5 % (ref 36.0–46.0)
Hemoglobin: 12.6 g/dL (ref 12.0–15.0)
MCH: 31 pg (ref 26.0–34.0)
MCHC: 32.7 g/dL (ref 30.0–36.0)
MCV: 94.6 fL (ref 80.0–100.0)
Platelets: 159 10*3/uL (ref 150–400)
RBC: 4.07 MIL/uL (ref 3.87–5.11)
RDW: 13.5 % (ref 11.5–15.5)
WBC: 8.1 10*3/uL (ref 4.0–10.5)
nRBC: 0 % (ref 0.0–0.2)

## 2023-08-09 LAB — HEMOGLOBIN A1C: Hemoglobin A1C: 6

## 2023-08-09 LAB — HEPARIN LEVEL (UNFRACTIONATED)
Heparin Unfractionated: 0.31 IU/mL (ref 0.30–0.70)
Heparin Unfractionated: 0.34 IU/mL (ref 0.30–0.70)

## 2023-08-09 SURGERY — LEFT HEART CATH AND CORONARY ANGIOGRAPHY
Anesthesia: Moderate Sedation

## 2023-08-09 MED ORDER — SODIUM CHLORIDE 0.9 % IV SOLN: 250.0000 mL | INTRAVENOUS | Status: DC | PRN

## 2023-08-09 MED ORDER — ASPIRIN 81 MG PO CHEW: 81.0000 mg | CHEWABLE_TABLET | ORAL | Status: DC

## 2023-08-09 MED ORDER — HEPARIN (PORCINE) 25000 UT/250ML-% IV SOLN: 1100.0000 [IU]/h | INTRAVENOUS | Status: DC

## 2023-08-09 MED ORDER — SODIUM CHLORIDE 0.9 % WEIGHT BASED INFUSION: 1.0000 mL/kg/h | INTRAVENOUS | Status: DC

## 2023-08-09 MED ORDER — SODIUM CHLORIDE 0.9 % WEIGHT BASED INFUSION: 3.0000 mL/kg/h | INTRAVENOUS | Status: DC

## 2023-08-09 MED ORDER — SODIUM CHLORIDE 0.9% FLUSH: 3.0000 mL | INTRAVENOUS | Status: DC | PRN

## 2023-08-09 NOTE — Progress Notes (Signed)
[facility] CLINIC CARDIOLOGY PROGRESS NOTE   Patient ID: Stacie Mcdonald MRN: [id number] DOB/AGE: 78-Jan-1947 78 y.o.  Admit date: 08/07/2023 Referring Physician Dr. Londell Moh Primary Physician Dr. Darrick Huntsman Primary Cardiologist Dr. Juliann Pares Reason for Consultation unstable angina  HPI: Stacie Mcdonald is a 77 y.o. female with a past medical history of CAD, MI s/p PCI with stent placement to ostial LAD and RCA, chronic systolic CHF, hypertension, hyperlipidemia,COPD, OSA, breast cancer s/p chemotherapy and radiation who presented to the ED on 08/07/2023 for chest pain. Cardiology was consulted for further evaluation.   Interval History:  -Patient feeling ok this AM, endorses nausea which she feels is related to receiving morphine. She did eat breakfast.  -Continues to have episodes of chest pain. States she did walk around the unit this AM and developed chest pain with this activity.  -HR and BP remain well controlled, tolerating home regimen well.  -Patient is to be transferred to [facility] today.  Review of systems complete and found to be negative unless listed above    Vitals:   08/08/23 1932 08/09/23 0100 08/09/23 0426 08/09/23 0741  BP: 115/71 (!) 99/56 114/64 109/60  Pulse: 86 73 71 69  Resp:    (!) 21  Temp: 98.5 F (36.9 C) 98.2 F (36.8 C) 98.3 F (36.8 C) 98.4 F (36.9 C)  TempSrc: Oral Oral Oral Oral  SpO2: 94% 94% 93% 93%  Weight:      Height:         Intake/Output Summary (Last 24 hours) at 08/09/2023 1004 Last data filed at 08/09/2023 0020 Gross per 24 hour  Intake 1312.16 ml  Output --  Net 1312.16 ml     PHYSICAL EXAM General: Well appearing, well nourished, in no acute distress sitting upright on side of bed with husband present at bedside. HEENT: Normocephalic and atraumatic. Neck: No JVD.  Lungs: Normal respiratory effort on room air. Clear bilaterally to auscultation. No wheezes, crackles, rhonchi.  Heart: HRRR. Normal S1 and S2 without gallops or murmurs.  Radial & DP pulses 2+ bilaterally. Abdomen: Non-distended appearing.  Msk: Normal strength and tone for age. Extremities: No clubbing, cyanosis or edema.   Neuro: Alert and oriented X 3. Psych: Mood appropriate, affect congruent.    LABS: Basic Metabolic Panel: Recent Labs    08/07/23 0928 08/08/23 0529  NA 138 139  K 3.9 3.7  CL 104 107  CO2 23 24  GLUCOSE 103* 114*  BUN 17 21  CREATININE 0.77 0.74  CALCIUM 9.0 8.9   Liver Function Tests: No results for input(s): "AST", "ALT", "ALKPHOS", "BILITOT", "PROT", "ALBUMIN" in the last 72 hours. No results for input(s): "LIPASE", "AMYLASE" in the last 72 hours. CBC: Recent Labs    08/08/23 0529 08/09/23 0326  WBC 7.0 8.1  HGB 13.5 12.6  HCT 39.6 38.5  MCV 90.4 94.6  PLT 217 159   Cardiac Enzymes: Recent Labs    08/07/23 1101 08/08/23 0909 08/08/23 1130  TROPONINIHS 58* 46* 55*   BNP: No results for input(s): "BNP" in the last 72 hours. D-Dimer: No results for input(s): "DDIMER" in the last 72 hours. Hemoglobin A1C: No results for input(s): "HGBA1C" in the last 72 hours. Fasting Lipid Panel: No results for input(s): "CHOL", "HDL", "LDLCALC", "TRIG", "CHOLHDL", "LDLDIRECT" in the last 72 hours. Thyroid Function Tests: No results for input(s): "TSH", "T4TOTAL", "T3FREE", "THYROIDAB" in the last 72 hours.  Invalid input(s): "FREET3" Anemia Panel: No results for input(s): "VITAMINB12", "FOLATE", "FERRITIN", "TIBC", "IRON", "RETICCTPCT" in the  last 72 hours.  No results found.   ECHO pending  TELEMETRY reviewed by me 08/09/23: sinus rhythm rate 70s  EKG reviewed by me 08/09/23: NSR rate 84 bpm, PVCs  DATA reviewed by me 08/09/23: last 24h vitals tele labs imaging I/O hospitalist progress note  Principal Problem:   Unstable angina (HCC) Active Problems:   CAD (coronary artery disease)   Hyperlipidemia with target low density lipoprotein (LDL) cholesterol less than 70 mg/dL   Essential hypertension    Insomnia due to stress   Disorder of diaphragm   S/P drug eluting coronary stent placement    ASSESSMENT AND PLAN: Stacie Mcdonald is a 77 y.o. female with a past medical history of CAD, MI s/p PCI with stent placement to ostial LAD and RCA, chronic systolic CHF, hypertension, hyperlipidemia,COPD, OSA, breast cancer s/p chemotherapy and radiation who presented to the ED on 08/07/2023 for chest pain. Cardiology was consulted for further evaluation.   # Unstable angina # Coronary artery disease s/p DES to mid-distal RCA 06/2023 Patient presenting with chest pain, recent LHC with stenting to mid and distal RCA with noted extensive in-stent restenosis at the proximal stent that did not receive intervention in August. Troponins on admission 37 > 58 > 46 > 55. EKG nonacute.  -Continue aspirin, plavix.  -Continue ranexa 1000 mg bid, imdur 120 mg daily.  -Continue heparin infusion.  -Given patient's significant history and known in-stent restenosis, will plan to transfer to [facility] for further evaluation with cardiac catheterization and possible intervention. Case discussed by Dr. Juliann Pares with Dr. [facility]) and patient is to be transferred later today.   # Chronic HFrEF Patient with chronically reduced EF on GDMT. Appears euvolemic on exam.  -Echo pending -Continue Entresto 49/51 mg bid, metoprolol succinate 200 mg daily, spironolactone 25 mg daily, lasix 40 mg daily. Home jardiance currently held  # Hypertension # Hyperlipidemia Recommend aggressive medical management given significant cardiac history -Continue praluent injections every 2 weeks.  -Continue BP regimen as above.  This patient's case was discussed and created with Dr. Darrold Junker and he is in agreement.  Signed:  Gale Journey, PA-C  08/09/2023, 10:04 AM [facility] Cardiology

## 2023-08-09 NOTE — Consult Note (Signed)
CARDIOLOGY CONSULT NOTE               Patient ID: Stacie Mcdonald MRN: [id number] DOB/AGE: [birth year] 77 y.o.  Admit date: 08/07/2023 Referring Physician Dr. Londell Moh hospitalist Primary Physician Dr. Darrick Huntsman primary Primary Cardiologist [facility] Reason for Consultation unstable angina multivessel coronary disease  HPI: Patient presents with multiple medical problems ejection multivessel coronary disease previous non-STEMI multiple PCI and stent hypertension hyperlipidemia mild obesity shortness of breath COPD asthma GERD hyperlipidemia presents with recurrent anginal symptoms.  Patient has been having recurrent breast pain mostly in the middle of the night even the patient is on Imdur and Ranexa and beta-blocker but still having recurrent symptoms of breath.  Patient recently had intervention in August 9 with in-stent restenosis 2 separate days was through multiple stents of the RCA patient had partial success of the procedure with a residual 40 to 50% and a proximal lesion and patient had significant improvement in symptoms up until recently when it started back again.  After previous intervention we discussed the need for possible complex intervention if she has recurrent symptoms patient called me at home to advise what to do when I told to either come here or go to [facility] she decided to come here and with anticipation of being transferred to [facility] for complex intervention and treatment patient currently pain-free on heparin Imdur paced Ranexa  Review of systems complete and found to be negative unless listed above     Past Medical History:  Diagnosis Date   Allergy    Arthritis    fingers, knees   Asthma    Breast Cancer 1991   R mastectomy. XRT , chemo   Breast cancer (HCC) 1991   RT MASTECTOMY   CAD (coronary artery disease) 2008   50% LAD occlusion 2009,  stent in mid LAD 2008 for 95%    Chicken pox    Colon polyps 02/2008   by Colonoscopy, [place]   Complication of  anesthesia    GERD (gastroesophageal reflux disease)    Heart disease    Hyperlipidemia    Hypertension    Myocardial infarction (HCC)    silent. 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   Sleep apnea    CPAP   Stable angina 07/27/2019   Thoracic vertebral fracture (HCC) 04/05/2012   Urinary tract infection    Wears contact lenses    left only    Past Surgical History:  Procedure Laterality Date   BREAST BIOPSY     right breast reconstruction S/P mastectomy   BREAST SURGERY     right mastectomy   CATARACT EXTRACTION W/PHACO Left 05/01/2020   Procedure: CATARACT EXTRACTION PHACO AND INTRAOCULAR LENS PLACEMENT (IOC) LEFT 5.45  00:48.6  11.2%;  Surgeon: Lockie Mola, MD;  Location: [facility];  Service: Ophthalmology;  Laterality: Left;  sleep apnea   COLONOSCOPY WITH PROPOFOL N/A 08/18/2019   Procedure: COLONOSCOPY WITH PROPOFOL;  Surgeon: Midge Minium, MD;  Location: [facility];  Service: Endoscopy;  Laterality: N/A;  sleep apnea   CORONARY ANGIOPLASTY WITH STENT PLACEMENT  Jan 2013   mid RCA 75% occlusion resolved to 0%, Sadiyah Kangas   CORONARY STENT INTERVENTION N/A 04/07/2018   Procedure: CORONARY STENT INTERVENTION;  Surgeon: Alwyn Pea, MD;  Location: [facility] IN[facility]AS[facility] CV LAB;  Service: Cardiovascular;  Laterality: N/A;   CORONARY STENT INTERVENTION N/A 02/20/2021   Procedure: CORONARY STENT INTERVENTION;  Surgeon: Alwyn Pea, MD;  Location: [facility];  Service: Cardiovascular;  Laterality: N/A;   CORONARY STENT INTERVENTION N/A 06/25/2023   Procedure: CORONARY STENT INTERVENTION;  Surgeon: Alwyn Pea, MD;  Location: [facility];  Service: Cardiovascular;  Laterality: N/A;   LEFT HEART CATH AND CORONARY ANGIOGRAPHY N/A  02/20/2021   Procedure: LEFT HEART CATH AND CORONARY ANGIOGRAPHY;  Surgeon: Alwyn Pea, MD;  Location: [facility];  Service: Cardiovascular;  Laterality: N/A;   LEFT HEART CATH AND CORONARY ANGIOGRAPHY Left 06/25/2023   Procedure: LEFT HEART CATH AND CORONARY ANGIOGRAPHY;  Surgeon: Alwyn Pea, MD;  Location: [facility];  Service: Cardiovascular;  Laterality: Left;   MASTECTOMY Right 1991   BREAST CA   POLYPECTOMY  08/18/2019   Procedure: POLYPECTOMY;  Surgeon: Midge Minium, MD;  Location: [facility];  Service: Endoscopy;;   PSEUDOANERYSM COMPRESSION N/A 02/27/2021   Procedure: Chiquita Loth COMPRESSION;  Surgeon: Renford Dills, MD;  Location: [facility];  Service: Cardiovascular;  Laterality: N/A;   RIGHT/LEFT HEART CATH AND CORONARY ANGIOGRAPHY N/A 04/07/2018   Procedure: RIGHT/LEFT HEART CATH AND CORONARY ANGIOGRAPHY;  Surgeon: Alwyn Pea, MD;  Location: [facility];  Service: Cardiovascular;  Laterality: N/A;   TONSILLECTOMY      No medications prior to admission.   Social History   Socioeconomic History   Marital status: Married    Spouse name: Gabriel Carina    Number of children: 4   Years of education: bachelors   Highest education level: Not on file  Occupational History   Occupation: RETIRED  Tobacco Use   Smoking status: Never   Smokeless tobacco: Never  Vaping Use   Vaping status: Never Used  Substance and Sexual Activity   Alcohol use: Yes    Alcohol/week: 1.0 standard drink of alcohol    Types: 1 Glasses of wine per week    Comment: occasional wine   Drug use: No   Sexual activity: Yes  Other Topics Concern   Not on file  Social History Narrative   Not on file   Social Determinants of Health   Financial Resource Strain: Low Risk  (05/17/2023)   Overall Financial Resource Strain (CARDIA)    Difficulty of Paying Living Expenses: Not hard at all  Food Insecurity: No Food Insecurity (08/07/2023)    Hunger Vital Sign    Worried About Running Out of Food in the Last Year: Never true    Ran Out of Food in the Last Year: Never true  Transportation Needs: No Transportation Needs (08/07/2023)   PRAPARE - Administrator, Civil Service (Medical): No    Lack of Transportation (Non-Medical): No  Physical Activity: Insufficiently Active (05/17/2023)   Exercise Vital Sign    Days of Exercise per Week: 3 days    Minutes of Exercise per Session: 30 min  Stress: No Stress Concern Present (05/17/2023)   [employer] of Occupational Health - Occupational Stress Questionnaire    Feeling of Stress : Only a little  Social Connections: Socially Integrated (05/17/2023)   Social Connection and Isolation Panel [NHANES]    Frequency of Communication with Friends and Family: More than three times a week    Frequency of Social Gatherings with Friends and Family: More than three times a week  Attends Religious Services: More than 4 times per year    Active Member of Clubs or Organizations: Yes    Attends Banker Meetings: More than 4 times per year    Marital Status: Married  Catering manager Violence: Not At Risk (08/07/2023)   Humiliation, Afraid, Rape, and Kick questionnaire    Fear of Current or Ex-Partner: No    Emotionally Abused: No    Physically Abused: No    Sexually Abused: No    Family History  Problem Relation Age of Onset   Heart disease Mother 30       massive MI   Heart disease Father 20       AMI   Cancer Father        metastatic lung CA   Diabetes Sister    Cancer Maternal Aunt    Breast cancer Maternal Aunt 50   Breast cancer Cousin        19s   Breast cancer Cousin        40s      Review of systems complete and found to be negative unless listed above      PHYSICAL EXAM  General: Well developed, well nourished, in no acute distress HEENT:  Normocephalic and atramatic Neck:  No JVD.  Lungs: Clear bilaterally to auscultation and  percussion. Heart: HRRR . Normal S1 and S2 without gallops or murmurs.  Abdomen: Bowel sounds are positive, abdomen soft and non-tender  Msk:  Back normal, normal gait. Normal strength and tone for age. Extremities: No clubbing, cyanosis or edema.   Neuro: Alert and oriented X 3. Psych:  Good affect, responds appropriately  Labs:   Lab Results  Component Value Date   WBC 8.1 08/09/2023   HGB 12.6 08/09/2023   HCT 38.5 08/09/2023   MCV 94.6 08/09/2023   PLT 159 08/09/2023    Recent Labs  Lab 08/08/23 0529  NA 139  K 3.7  CL 107  CO2 24  BUN 21  CREATININE 0.74  CALCIUM 8.9  GLUCOSE 114*   Lab Results  Component Value Date   CKTOTAL 91 06/21/2013   CKMB 1.4 06/21/2013   TROPONINI <0.03 12/29/2017    Lab Results  Component Value Date   CHOL 117 11/23/2022   CHOL 135 05/06/2022   CHOL 113 11/04/2021   Lab Results  Component Value Date   HDL 44.10 11/23/2022   HDL 42.70 05/06/2022   HDL 43.20 11/04/2021   Lab Results  Component Value Date   LDLCALC 46 11/23/2022   LDLCALC 58 05/06/2022   LDLCALC 38 11/04/2021   Lab Results  Component Value Date   TRIG 132.0 11/23/2022   TRIG 172.0 (H) 05/06/2022   TRIG 158.0 (H) 11/04/2021   Lab Results  Component Value Date   CHOLHDL 3 11/23/2022   CHOLHDL 3 05/06/2022   CHOLHDL 3 11/04/2021   Lab Results  Component Value Date   LDLDIRECT 63.0 11/23/2022   LDLDIRECT 75.0 05/06/2022   LDLDIRECT 123.0 01/20/2018      Radiology: DG Chest 2 View  Result Date: 08/07/2023 CLINICAL DATA:  Chest pain radiating to neck. Shortness of breath. Coronary artery disease. EXAM: CHEST - 2 VIEW COMPARISON:  03/30/2012 FINDINGS: The heart size and mediastinal contours are within normal limits. Chronic elevation of right hemidiaphragm is again seen. Both lungs are clear. Surgical clips again seen in the right anterior chest wall. IMPRESSION: No active cardiopulmonary disease. Chronic elevation of right hemidiaphragm. Electronically  Signed  By: Danae Orleans M.D.   On: 08/07/2023 09:22    EKG: Sinus rhythm nonspecific ST-T wave changes rate of 90  ASSESSMENT AND PLAN:  Impression Unstable angina Hypertension History of non-STEMI Dyspnea on exertion Hyperlipidemia History of PCI and stent GERD Obstructive sleep apnea Mild obesity Mild systolic cardiomyopathy History of recent PCI of in-stent restenosis with balloon angioplasty   Plan Agree to admit rule out myocardial infarction Follow-up troponins and EKGs This does not represent a non-STEMI at this point/this is more indicative of demand ischemia Recommend antianginal therapy nitrates heparin aspirin Plavix beta-blockers Status post PCI of in-stent restenosis August 8 complex procedure multiple stents may need complex intervention in the future Continue hypertension management and control Agree with purulent therapy for hyperlipidemia Imdur 120 mg a day/metoprolol 200 mg a day Ranexa Entresto Maintain anticoagulation with heparin Patient will probably we need repeat cardiac catheter for evaluation of coronary anatomy Will consider transferring to a tertiary care center at some point as this may require complex intervention  Signed: Alwyn Pea MD, 08/09/2023, 1:38 PM

## 2023-08-09 NOTE — Discharge Summary (Signed)
Physician Discharge Summary   Patient: Stacie Mcdonald MRN: [id number] DOB: [birth year]  Admit date:     08/07/2023  Discharge date: 08/09/23  Discharge Physician: Arnetha Courser   PCP: Sherlene Shams, MD   Recommendations at discharge:  Patient is being transferred to [facility]  Discharge Diagnoses: Principal Problem:   Unstable angina [facility]) Active Problems:   CAD (coronary artery disease)   S/P drug eluting coronary stent placement   Essential hypertension   Hyperlipidemia with target low density lipoprotein (LDL) cholesterol less than 70 mg/dL   Disorder of diaphragm   Insomnia due to stress   NSTEMI (non-ST elevated myocardial infarction) [facility])   Hospital Course: Ms. Stacie Mcdonald is a 77 year old female with history of CAD status post PCI, hypertension, angina on Ranexa, GERD, who presents emergency department for chief concerns of chest pain.  Patient is status post left heart cath on 06/25/2023, per cardiac cath note successful PCI to mid RCA and distal RCA. There is extensive in-stent restenosis at the proximal stent that did not receive intervention on 8/9.  Vitals in the ED showed temperature of 98, respiration rate of 20, heart rate of 80, blood pressure 144/105, SpO2 of 97% on room air.  Serum sodium of 138, potassium 3.9, chloride 104, bicarb 23, BUN of 17, serum creatinine 0.77, EGFR greater than 60, nonfasting blood glucose 103, WBC 7.2, hemoglobin 14.5, platelets of 254. High sensitive troponin was 27 and on repeat is 58. EKG with sinus rhythm, multiple PVCs and RBBB, which seems chronic.  ED treatment: Aspirin 324 mg p.o. one-time dose, nitroglycerin 1 inch.   9/22: Vital and labs stable.  Dr. Juliann Pares saw the patient and she might need a complex procedure requiring multiple stents in the future due to extensive cardiac disease.  She does not look like an NSTEMI at this point due to barely positive troponin,37>>58>>46. She was started on heparin infusion by  cardiology based on her risk factors, will continue trending troponin.  9/23: Patient remained hemodynamically stable.  She was accepted at [facility] for complicated PCI where she is being transferred for further management.  Assessment and Plan: * Unstable angina [facility]) Patient with intermittent typical chest pain, troponin barely positive with a flat curve, extensive CAD, cardiology is on board, unlikely NSTEMI, mostly demand ischemia and concern of unstable angina.  Cardiology is on board Continue heparin per pharmacy Complete echo ordered Patient likely need transfer to a tertiary care center due to concern of needing complicated PCI. Continue with supportive care  CAD (coronary artery disease) Home Imdur 120 milligrams daily, furosemide 40 mg daily, metoprolol succinate 200 mg daily, spironolactone 25 mg daily, Entresto 49-51 mg p.o. twice daily, Ranexa 500 mg p.o. twice daily were resumed on admission  S/P drug eluting coronary stent placement Patient is status post left heart cath on 06/25/2023, per cardiac cath note successful PCI to mid RCA and distal RCA There is extensive in-stent restenosis at the proximal stent that did not receive intervention on 8/9  Essential hypertension Spironolactone 25 mg daily, Entresto 49-51 mg p.o. twice daily, metoprolol succinate 200 mg daily, isosorbide mononitrate 120 mg daily, furosemide 40 mg daily were resumed on admission  Hyperlipidemia with target low density lipoprotein (LDL) cholesterol less than 70 mg/dL Patient is on Praluent 75 mg into the muscle every 14 days, her last dose was 08/02/23  Disorder of diaphragm Incentive spirometry and flutter valve every 2 hours while awake  Insomnia due to stress Trazodone 150 mg nightly resumed  Consultants: Cardiology Procedures performed: None Disposition:  [facility] Diet recommendation:  Discharge Diet Orders (From admission, onward)     Start     Ordered   08/09/23 0000  Diet - low sodium  heart healthy        08/09/23 1019           Cardiac and Carb modified diet DISCHARGE MEDICATION: Allergies as of 08/09/2023       Reactions   Penicillins Hives   Has patient had a PCN reaction causing immediate rash, facial/tongue/throat swelling, SOB or lightheadedness with hypotension: No Has patient had a PCN reaction causing severe rash involving mucus membranes or skin necrosis: Yes Has patient had a PCN reaction that required hospitalization: No Has patient had a PCN reaction occurring within the last 10 years: Yes If all of the above answers are "NO", then may proceed with Cephalosporin use.   Statins    Other reaction(s): Muscle Pain        Medication List     TAKE these medications    albuterol 108 (90 Base) MCG/ACT inhaler Commonly known as: VENTOLIN HFA Inhale 2 puffs into the lungs every 6 (six) hours as needed. Using in Am daily  per MD   aspirin 81 MG tablet Take 81 mg by mouth daily.   clopidogrel 75 MG tablet Commonly known as: PLAVIX TAKE ONE TABLET BY MOUTH DAILY   D-MANNOSE PO Take 800 mg by mouth daily.   ELDERBERRY PO Take 1,000 mg by mouth daily.   empagliflozin 10 MG Tabs tablet Commonly known as: Jardiance Take 1 tablet (10 mg total) by mouth daily before breakfast.   furosemide 40 MG tablet Commonly known as: LASIX Take 40 mg by mouth daily.   GLUCOSAMINE-CHONDROITIN PO Take 2 tablets by mouth daily.   heparin 28413 UT/250ML infusion Inject 1,100 Units/hr into the vein continuous.   isosorbide mononitrate 120 MG 24 hr tablet Commonly known as: IMDUR Take 120 mg by mouth daily.   loratadine 10 MG tablet Commonly known as: CLARITIN Take 10 mg by mouth daily as needed for allergies.   metoprolol 200 MG 24 hr tablet Commonly known as: TOPROL-XL TAKE 1 TABLET BY MOUTH DAILY. What changed:  how much to take how to take this when to take this   nitroGLYCERIN 0.4 MG SL tablet Commonly known as: NITROSTAT Place 0.4 mg  under the tongue every 5 (five) minutes as needed for chest pain.   omeprazole 20 MG capsule Commonly known as: PRILOSEC Take 1 capsule (20 mg total) by mouth daily. What changed:  how much to take how to take this when to take this reasons to take this additional instructions   OVER THE COUNTER MEDICATION Take 50 mg by mouth daily. Blood sugar support with Pyerogel   Praluent 75 MG/ML Soaj Generic drug: Alirocumab Inject 75 mg into the muscle every 14 (fourteen) days.   ranolazine 500 MG 12 hr tablet Commonly known as: RANEXA Take 500 mg by mouth 2 (two) times daily.   sacubitril-valsartan 49-51 MG Commonly known as: ENTRESTO Take 1 tablet by mouth 2 (two) times daily.   spironolactone 25 MG tablet Commonly known as: Aldactone Take 1 tablet (25 mg total) by mouth daily.   traZODone 150 MG tablet Commonly known as: DESYREL Take 1 tablet (150 mg total) by mouth at bedtime.        Discharge Exam: Filed Weights   08/07/23 0859  Weight: 80.7 kg   General.  Well-developed elderly lady,  in no acute distress. Pulmonary.  Lungs clear bilaterally, normal respiratory effort. CV.  Regular rate and rhythm, no JVD, rub or murmur. Abdomen.  Soft, nontender, nondistended, BS positive. CNS.  Alert and oriented .  No focal neurologic deficit. Extremities.  No edema, no cyanosis, pulses intact and symmetrical. Psychiatry.  Judgment and insight appears normal.   Condition at discharge: stable  The results of significant diagnostics from this hospitalization (including imaging, microbiology, ancillary and laboratory) are listed below for reference.   Imaging Studies: DG Chest 2 View  Result Date: 08/07/2023 CLINICAL DATA:  Chest pain radiating to neck. Shortness of breath. Coronary artery disease. EXAM: CHEST - 2 VIEW COMPARISON:  03/30/2012 FINDINGS: The heart size and mediastinal contours are within normal limits. Chronic elevation of right hemidiaphragm is again seen. Both  lungs are clear. Surgical clips again seen in the right anterior chest wall. IMPRESSION: No active cardiopulmonary disease. Chronic elevation of right hemidiaphragm. Electronically Signed   By: Danae Orleans M.D.   On: 08/07/2023 09:22    Microbiology: Results for orders placed or performed during the hospital encounter of 02/18/21  SARS CORONAVIRUS 2 (TAT 6-24 HRS) Nasopharyngeal Nasopharyngeal Swab     Status: None   Collection Time: 02/18/21  8:22 AM   Specimen: Nasopharyngeal Swab  Result Value Ref Range Status   SARS Coronavirus 2 NEGATIVE NEGATIVE Final    Comment: (NOTE) SARS-CoV-2 target nucleic acids are NOT DETECTED.  The SARS-CoV-2 RNA is generally detectable in upper and lower respiratory specimens during the acute phase of infection. Negative results do not preclude SARS-CoV-2 infection, do not rule out co-infections with other pathogens, and should not be used as the sole basis for treatment or other patient management decisions. Negative results must be combined with clinical observations, patient history, and epidemiological information. The expected result is Negative.  Fact Sheet for Patients: HairSlick.no  Fact Sheet for Healthcare Providers: quierodirigir.com  This test is not yet approved or cleared by the [place] FDA and  has been authorized for detection and/or diagnosis of SARS-CoV-2 by FDA under an Emergency Use Authorization (EUA). This EUA will remain  in effect (meaning this test can be used) for the duration of the COVID-19 declaration under Se ction 564(b)(1) of the Act, 21 U.S.C. section 360bbb-3(b)(1), unless the authorization is terminated or revoked sooner.  Performed at [facility], [address] [place], [place] [address]     Labs: CBC: Recent Labs  Lab 08/07/23 [phone] 08/08/23 0529 08/09/23 0326  WBC 7.2 7.0 8.1  HGB 14.5 13.5 12.6  HCT 43.8 39.6 38.5  MCV 92.0 90.4  94.6  PLT 254 217 159   Basic Metabolic Panel: Recent Labs  Lab 08/07/23 0928 08/08/23 0529  NA 138 139  K 3.9 3.7  CL 104 107  CO2 23 24  GLUCOSE 103* 114*  BUN 17 21  CREATININE 0.77 0.74  CALCIUM 9.0 8.9   Liver Function Tests: No results for input(s): "AST", "ALT", "ALKPHOS", "BILITOT", "PROT", "ALBUMIN" in the last 168 hours. CBG: No results for input(s): "GLUCAP" in the last 168 hours.  Discharge time spent: greater than 30 minutes.  This record has been created using Conservation officer, historic buildings. Errors have been sought and corrected,but may not always be located. Such creation errors do not reflect on the standard of care.   Signed: Arnetha Courser, MD [facility] 08/09/2023

## 2023-08-09 NOTE — Consult Note (Signed)
ANTICOAGULATION CONSULT NOTE  Pharmacy Consult for Heparin Infusion Indication: chest pain/ACS  Patient Measurements: Height: 5\' 4"  (162.6 cm) Weight: 80.7 kg (178 lb) IBW/kg (Calculated) : 54.7 Heparin Dosing Weight: 72.1 kg   Labs: Recent Labs    08/07/23 0928 08/07/23 1101 08/07/23 1218 08/07/23 1957 08/08/23 0529 08/08/23 0909 08/08/23 1130 08/08/23 1515 08/09/23 0104 08/09/23 0326 08/09/23 1015  HGB 14.5  --   --   --  13.5  --   --   --   --  12.6  --   HCT 43.8  --   --   --  39.6  --   --   --   --  38.5  --   PLT 254  --   --   --  217  --   --   --   --  159  --   APTT  --   --  26  --   --   --   --   --   --   --   --   LABPROT  --   --  13.8  --   --   --   --   --   --   --   --   INR  --   --  1.0  --   --   --   --   --   --   --   --   HEPARINUNFRC  --   --   --    < > 0.40  --   --  0.28* 0.31  --  0.34  CREATININE 0.77  --   --   --  0.74  --   --   --   --   --   --   TROPONINIHS 37* 58*  --   --   --  46* 55*  --   --   --   --    < > = values in this interval not displayed.   Medical History: Past Medical History:  Diagnosis Date   Allergy    Arthritis    fingers, knees   Asthma    Breast Cancer 1991   R mastectomy. 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 female presenting with intermittent jaw and neck pain. PMH significant for CAD, HTN, HLD, T2DM, ILD. Patient was not on Stacie Mcdonald PTA per chart review. Pharmacy has been consulted to initiate and manage heparin infusion.   Baseline Labs: aPTT 26, PT  13.8, INR 1.0, Hgb 14.5, Hct 43.8, Plt 254   Goal of Therapy:  Heparin level 0.3-0.7 units/ml Monitor platelets by anticoagulation protocol: Yes   Date Time HL Rate/Comment  9/21 1957 0.11 850/subtherapeutic 9/22 0529 0.40 1050/therapeutic x 1 9/22 1515 0.28 Subtherapeutic 9/23 0104 0.31 Therapeutic x 1 9/23 1015 0.34 Therapeutic x 2  Plan:  Continue heparin infusion rate to 1100 units/hr Recheck heparin level with AM labs Daily CBC while on heparin  Stacie Mcdonald PharmD, BCPS 08/09/2023 11:36 AM

## 2023-08-09 NOTE — Consult Note (Signed)
ANTICOAGULATION CONSULT NOTE  Pharmacy Consult for Heparin Infusion Indication: chest pain/ACS  Patient Measurements: Height: 5\' 4"  (162.6 cm) Weight: 80.7 kg (178 lb) IBW/kg (Calculated) : 54.7 Heparin Dosing Weight: 72.1 kg   Labs: Recent Labs    08/07/23 0928 08/07/23 1101 08/07/23 1218 08/07/23 1957 08/08/23 0529 08/08/23 0909 08/08/23 1130 08/08/23 1515 08/09/23 0104  HGB 14.5  --   --   --  13.5  --   --   --   --   HCT 43.8  --   --   --  39.6  --   --   --   --   PLT 254  --   --   --  217  --   --   --   --   APTT  --   --  26  --   --   --   --   --   --   LABPROT  --   --  13.8  --   --   --   --   --   --   INR  --   --  1.0  --   --   --   --   --   --   HEPARINUNFRC  --   --   --    < > 0.40  --   --  0.28* 0.31  CREATININE 0.77  --   --   --  0.74  --   --   --   --   TROPONINIHS 37* 58*  --   --   --  46* 55*  --   --    < > = values in this interval not displayed.   Medical History: Past Medical History:  Diagnosis Date   Allergy    Arthritis    fingers, knees   Asthma    Breast Cancer 1991   R mastectomy. 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 female  presenting with intermittent jaw and neck pain. PMH significant for CAD, HTN, HLD, T2DM, ILD. Patient was not on [facility] PTA per chart review. Pharmacy has been consulted to initiate and manage heparin infusion.   Baseline Labs: aPTT 26, PT 13.8, INR 1.0, Hgb 14.5, Hct 43.8, Plt 254   Goal of Therapy:  Heparin level 0.3-0.7 units/ml Monitor platelets by anticoagulation protocol: Yes   Date Time HL Rate/Comment  9/21 1957 0.11 850/subtherapeutic 9/22 0529 0.40 1050/therapeutic x 1 9/22 1515 0.28 Subtherapeutic 9/23 0104 0.31 Therapeutic  x 1  Plan:  Heparin level subtherapeutic Give heparin 1000 units IV x 1 Continue heparin infusion rate to 1100 units/hr Recheck heparin level 8 hours to confirm Daily CBC while on heparin  Otelia Sergeant, PharmD, [facility] 08/09/2023 2:39 AM

## 2023-08-09 NOTE — Plan of Care (Signed)

## 2023-08-09 NOTE — Progress Notes (Signed)
Spoke with [employer] and arranged ground EMS transportation [phone]). Will call when the next truck is available for EMS pickup.  Patient will be going to [facility], room [phone] (for report, call [phone]).   [place], Nelson Chimes, and primary RN, Lezlie Lye made aware.

## 2023-08-10 DIAGNOSIS — I200 Unstable angina: Secondary | ICD-10-CM | POA: Diagnosis not present

## 2023-08-10 NOTE — Consult Note (Signed)
[facility] Customer service manager [facility]) Accountable Care Organization (ACO) [facility] Liaison Note  08/10/2023  Stacie Mcdonald 08-26-1946 [id number]  Location: [facility] RN Hospital Liaison screened the patient remotely at [facility].  Insurance: [employer] Medicare Advantage   Stacie Mcdonald is a 77 y.o. female who is a Primary Care Patient of [place], Mar Daring, MD [facility]. The patient was screened for readmission hospitalization with noted low risk score for unplanned readmission risk with 2 IP in 6 months.  The patient was assessed for potential [facility]) Care Management service needs for post hospital transition for care coordination. Review of patient's electronic medical record reveals patient was admitted for a NSTEMI. Pt discharge the hospital with no anticipated needs.     The [facility] Care Management/Population Health does not replace or interfere with any arrangements made by the Inpatient Transition of Care team.   For questions contact:   Elliot Cousin, RN, [facility] Liaison [place]   Population Health Office Hours MTWF  8:00 am-6:00 pm [phone] mobile [phone] [Office toll free line] Office Hours are M-F 8:30 - 5 pm [name]

## 2023-08-11 ENCOUNTER — Telehealth: Payer: Self-pay

## 2023-08-11 DIAGNOSIS — I200 Unstable angina: Secondary | ICD-10-CM | POA: Diagnosis not present

## 2023-08-11 DIAGNOSIS — I209 Angina pectoris, unspecified: Secondary | ICD-10-CM | POA: Diagnosis not present

## 2023-08-11 NOTE — Telephone Encounter (Signed)
Spoke with pt and scheduled her for 08/17/2023

## 2023-08-11 NOTE — Telephone Encounter (Signed)
Patient states she was a patient at [facility] this past weekend and was transferred to [facility] on 08/09/2023.  Patient states she came home today.  We need to schedule a hospital follow-up, but Dr. Melina Schools schedule is full for the next two weeks.  Please let us know how to proceed.

## 2023-08-12 ENCOUNTER — Telehealth: Payer: Self-pay | Admitting: *Deleted

## 2023-08-12 NOTE — Transitions of Care (Post Inpatient/ED Visit) (Signed)
08/12/2023  Name: Stacie Mcdonald MRN: [id number] DOB: [birth year]  Today's TOC FU Call Status: Today's TOC FU Call Status:: Successful TOC FU Call Completed TOC FU Call Complete Date: 08/12/23 Patient's Name and Date of Birth confirmed.  Transition Care Management Follow-up Telephone Call Date of Discharge: 08/11/23 Discharge Facility: Other [facility]) Name of Other ([facility]) Discharge Facility: [facility] Type of Discharge: Inpatient Admission Primary Inpatient Discharge Diagnosis:: [place]; CAD with PCI How have you been since you were released from the hospital?: Better ("I'm doing better; no issues; I am retired Engineer, civil (consulting) from the cardiac ICU, so I don't need any additional help at this time") Any questions or concerns?: No  Items Reviewed: Did you receive and understand the discharge instructions provided?: Yes (briefly reviewed with patient who verbalizes good understanding of same - outside hospital AVS) Medications obtained,verified, and reconciled?: Yes (Medications Reviewed) (Full medication reconciliation/ review completed; no concerns or discrepancies identified; confirmed patient obtained/ is taking all newly Rx'd medications as instructed; self-manages medications and denies questions/ concerns around medications today) Any new allergies since your discharge?: No Dietary orders reviewed?: Yes Type of Diet Ordered:: Heart Healthy, Low salt" Do you have support at home?: Yes People in Home: spouse Name of Support/Comfort Primary Source: Reports independent in self-care activities; supportive spouse assists as/ if needed/ indicated  Medications Reviewed Today: Medications Reviewed Today     Reviewed by Michaela Corner, RN (Registered Nurse) on 08/12/23 at 1531  Med List Status: <None>   Medication Order Taking? Sig Documenting Provider Last Dose Status Informant  albuterol (VENTOLIN HFA) 108 (90 Base) MCG/ACT inhaler [id number] Yes Inhale 2 puffs into the lungs every 6 (six)  hours as needed. Using in Am daily  per MD [provider] Taking Active   aspirin 81 MG tablet [id number] Yes Take 81 mg by mouth daily. [provider] Taking Active Self  clopidogrel (PLAVIX) 75 MG tablet [id number] Yes TAKE ONE TABLET BY MOUTH DAILY  Patient taking differently: Take 75 mg by mouth daily.   Sherlene Shams, MD Taking Active   D-MANNOSE PO [id number] Yes Take 800 mg by mouth daily. [provider] Taking Active Self  ELDERBERRY PO [id number] Yes Take 1,000 mg by mouth daily. [provider] Taking Active Self  empagliflozin (JARDIANCE) 10 MG TABS tablet [id number] Yes Take 1 tablet (10 mg total) by mouth daily before breakfast. Sherlene Shams, MD Taking Active   furosemide (LASIX) 40 MG tablet [id number] Yes Take 40 mg by mouth daily. [provider] Taking Active Self           Med Note Gunnar Fusi, MELISSA R   Tue Feb 18, 2021  2:37 PM)    GLUCOSAMINE-CHONDROITIN PO [id number] Yes Take 2 tablets by mouth daily. [provider] Taking Active Self  heparin 95188 UT/250ML infusion [id number] No Inject 1,100 Units/hr into the vein continuous.  Patient not taking: Reported on 08/12/2023   Arnetha Courser, MD Not Taking Active   isosorbide mononitrate (IMDUR) 120 MG 24 hr tablet [id number] Yes Take 120 mg by mouth daily.  [provider] Taking Active Self           Med Note Tula Nakayama, SUSANNE P   Tue Jul 13, 2023  1:10 PM)    loratadine (CLARITIN) 10 MG tablet [id number] Yes Take 10 mg by mouth daily as needed for allergies. [provider] Taking Active Self  metoprolol (TOPROL-XL) 200 MG 24 hr tablet [id number] Yes TAKE 1 TABLET BY MOUTH DAILY.  Patient taking differently: Take 200 mg by mouth daily. TAKE 1 TABLET BY MOUTH DAILY.   Sherlene Shams, MD Taking Active   nitroGLYCERIN (NITROSTAT) 0.4 MG SL tablet [id number] Yes Place 0.4 mg under the tongue every 5 (five) minutes as needed for chest pain. [provider]  Taking Active Self           Med Note Jonnie Kind Aug 12, 2023  3:14 PM) 08/12/23: Reports during TOC acll has not needed post-hospital discharge from [facility] on 08/11/23   omeprazole (PRILOSEC) 20 MG capsule [id number] Yes Take 1 capsule (20 mg total) by mouth daily.  Patient taking differently: Take 20 mg by mouth daily as needed (acid reflux).   Sherlene Shams, MD Taking Active   OVER THE COUNTER MEDICATION [id number] No Take 50 mg by mouth daily. Blood sugar support with [place]  Patient not taking: Reported on 08/12/2023   [provider] Not Taking Active Self  PRALUENT 75 MG/ML SOAJ [id number] Yes Inject 75 mg into the muscle every 14 (fourteen) days. [provider] Taking Active Self  ranolazine (RANEXA) 500 MG 12 hr tablet [id number] Yes Take 500 mg by mouth 2 (two) times daily. [provider] Taking Active Self           Med Note Tula Nakayama, SUSANNE P   Tue Jul 13, 2023  1:12 PM)    sacubitril-valsartan (ENTRESTO) 49-51 MG [id number] Yes Take 1 tablet by mouth 2 (two) times daily. [provider] Taking Active Self  spironolactone (ALDACTONE) 25 MG tablet [id number] Yes Take 1 tablet (25 mg total) by mouth daily. Sherlene Shams, MD Taking Active Self  traZODone (DESYREL) 150 MG tablet [id number] Yes Take 1 tablet (150 mg total) by mouth at bedtime. Sherlene Shams, MD Taking Active            Home Care and Equipment/Supplies: Were Home Health Services Ordered?: No Any new equipment or medical supplies ordered?: No  Functional Questionnaire: Do you need assistance with bathing/showering or dressing?: No Do you need assistance with meal preparation?: No Do you need assistance with eating?: No Do you have difficulty maintaining continence: No Do you need assistance with getting out of bed/getting out of a chair/moving?: No Do you have difficulty managing or taking your medications?: No  Follow up appointments reviewed: PCP Follow-up  appointment confirmed?: Yes Date of PCP follow-up appointment?: 08/17/23 Follow-up Provider: PCP Specialist Hospital Follow-up appointment confirmed?: Yes Date of Specialist follow-up appointment?: 08/24/23 Follow-Up Specialty Provider:: Cardiologist at [facility] Do you need transportation to your follow-up appointment?: No Do you understand care options if your condition(s) worsen?: Yes-patient verbalized understanding  SDOH Interventions Today    Flowsheet Row Most Recent Value  SDOH Interventions   Food Insecurity Interventions Intervention Not Indicated  Transportation Interventions Intervention Not Indicated  [drives self,  spouse assists as needed]      TOC Interventions Today    Flowsheet Row Most Recent Value  TOC Interventions   TOC Interventions Discussed/Reviewed TOC Interventions Discussed, Post op wound/incision care  [Patient declines need for ongoing/ further care management/ coordination outreach,  no needs identified at time of TOC call today- declines enrollment in 30-day TOC program]      Interventions Today    Flowsheet Row Most Recent Value  Chronic Disease   Chronic disease during today's visit Other  [CAD with PCI]  General Interventions   General Interventions Discussed/Reviewed General Interventions Discussed, Durable Medical Equipment (DME),  Doctor Visits  Doctor Visits Discussed/Reviewed Specialist, PCP, Doctor Visits Discussed  Durable Medical Equipment (DME) Other  [confirmed not currently requiring/ using assistive devices]  PCP/Specialist Visits Compliance with follow-up visit  Exercise Interventions   Exercise Discussed/Reviewed Exercise Discussed  [confirmed patient active with outpatient cardiac rehabilitation- encouraged her ongoing participation/ engagement]  Education Interventions   Education Provided Provided Education  Provided Verbal Education On Medication  [side effects/ safe [place] of NTG]  Nutrition Interventions    Nutrition Discussed/Reviewed Nutrition Discussed  Pharmacy Interventions   Pharmacy Dicussed/Reviewed Pharmacy Topics Discussed  [Full medication review with updating medication list in EHR per patient report]      Caryl Pina, RN, BSN, CCRN Alumnus RN CM Care Coordination/ Transition of Care- [facility] Care Management [phone]: direct office

## 2023-08-16 ENCOUNTER — Encounter: Payer: Medicare Other | Admitting: *Deleted

## 2023-08-16 DIAGNOSIS — Z9861 Coronary angioplasty status: Secondary | ICD-10-CM | POA: Diagnosis not present

## 2023-08-16 DIAGNOSIS — Z48812 Encounter for surgical aftercare following surgery on the circulatory system: Secondary | ICD-10-CM | POA: Diagnosis not present

## 2023-08-16 NOTE — Progress Notes (Signed)
Daily Session Note  Patient Details  Name: Stacie Mcdonald MRN: [id number] Date of Birth: 1946/07/30 Referring Provider:   Flowsheet Row Cardiac Rehab from 07/15/2023 in [facility]  Referring Provider Dr. Dorothyann Peng, MD       Encounter Date: 08/16/2023  Check In:  Session Check In - 08/16/23 1417       Check-In   Supervising physician immediately available to respond to emergencies See telemetry face sheet for immediately available ER MD    Location [facility]-Cardiac & Pulmonary Rehab    Staff Present Maxon Suzzette Righter, , Exercise Physiologist;Kaileigh Viswanathan Jewel Baize, RN BSN;Joseph Reino Kent, RCP,RRT,BSRT    Virtual Visit No    Medication changes reported     No    Fall or balance concerns reported    No    Warm-up and Cool-down Performed on first and last piece of equipment    Resistance Training Performed Yes    VAD Patient? No    PAD/SET Patient? No      Pain Assessment   Currently in Pain? No/denies                Social History   Tobacco Use  Smoking Status Never  Smokeless Tobacco Never    Goals Met:  Independence with exercise equipment Exercise tolerated well No report of concerns or symptoms today Strength training completed today  Goals Unmet:  Not Applicable  Comments: Pt able to follow exercise prescription today without complaint.  Will continue to monitor for progression.    Dr. Bethann Punches is Medical Director for [facility] Cardiac Rehabilitation.  Dr. Vida Rigger is Medical Director for [facility] Pulmonary Rehabilitation.

## 2023-08-17 ENCOUNTER — Encounter: Payer: Self-pay | Admitting: Internal Medicine

## 2023-08-17 ENCOUNTER — Ambulatory Visit: Payer: Medicare Other | Admitting: Internal Medicine

## 2023-08-17 VITALS — BP 96/66 | HR 80 | Ht 64.0 in | Wt 173.0 lb

## 2023-08-17 DIAGNOSIS — R5383 Other fatigue: Secondary | ICD-10-CM

## 2023-08-17 DIAGNOSIS — E1169 Type 2 diabetes mellitus with other specified complication: Secondary | ICD-10-CM

## 2023-08-17 DIAGNOSIS — E785 Hyperlipidemia, unspecified: Secondary | ICD-10-CM

## 2023-08-17 DIAGNOSIS — I10 Essential (primary) hypertension: Secondary | ICD-10-CM

## 2023-08-17 DIAGNOSIS — Z09 Encounter for follow-up examination after completed treatment for conditions other than malignant neoplasm: Secondary | ICD-10-CM | POA: Insufficient documentation

## 2023-08-17 LAB — COMPREHENSIVE METABOLIC PANEL
ALT: 16 U/L (ref 0–35)
AST: 14 U/L (ref 0–37)
Albumin: 4.6 g/dL (ref 3.5–5.2)
Alkaline Phosphatase: 48 U/L (ref 39–117)
BUN: 22 mg/dL (ref 6–23)
CO2: 26 meq/L (ref 19–32)
Calcium: 9.5 mg/dL (ref 8.4–10.5)
Chloride: 101 meq/L (ref 96–112)
Creatinine, Ser: 1.07 mg/dL (ref 0.40–1.20)
GFR: 49.72 mL/min — ABNORMAL LOW (ref >60.00)
Glucose, Bld: 106 mg/dL — ABNORMAL HIGH (ref 70–99)
Potassium: 5 meq/L (ref 3.5–5.1)
Sodium: 135 meq/L (ref 135–145)
Total Bilirubin: 0.5 mg/dL (ref 0.2–1.2)
Total Protein: 7.7 g/dL (ref 6.0–8.3)

## 2023-08-17 LAB — IBC + FERRITIN
Ferritin: 168.4 ng/mL (ref 10.0–291.0)
Iron: 125 ug/dL (ref 42–145)
Saturation Ratios: 35.9 % (ref 20.0–50.0)
TIBC: 348.6 ug/dL (ref 250.0–450.0)
Transferrin: 249 mg/dL (ref 212.0–360.0)

## 2023-08-17 LAB — CBC WITH DIFFERENTIAL/PLATELET
Basophils Absolute: 0 10*3/uL (ref 0.0–0.1)
Basophils Relative: 0.6 % (ref 0.0–3.0)
Eosinophils Absolute: 0.1 10*3/uL (ref 0.0–0.7)
Eosinophils Relative: 2.2 % (ref 0.0–5.0)
HCT: 41.7 % (ref 36.0–46.0)
Hemoglobin: 13.5 g/dL (ref 12.0–15.0)
Lymphocytes Relative: 34.4 % (ref 12.0–46.0)
Lymphs Abs: 2.3 10*3/uL (ref 0.7–4.0)
MCHC: 32.4 g/dL (ref 30.0–36.0)
MCV: 93 fL (ref 78.0–100.0)
Monocytes Absolute: 0.4 10*3/uL (ref 0.1–1.0)
Monocytes Relative: 6.8 % (ref 3.0–12.0)
Neutro Abs: 3.7 10*3/uL (ref 1.4–7.7)
Neutrophils Relative %: 56 % (ref 43.0–77.0)
Platelets: 288 10*3/uL (ref 150.0–400.0)
RBC: 4.48 Mil/uL (ref 3.87–5.11)
RDW: 14.3 % (ref 11.5–15.5)
WBC: 6.6 10*3/uL (ref 4.0–10.5)

## 2023-08-17 LAB — MICROALBUMIN / CREATININE URINE RATIO
Creatinine,U: 18.6 mg/dL
Microalb Creat Ratio: 3.8 mg/g (ref 0.0–30.0)
Microalb, Ur: <0.7 mg/dL (ref 0.0–1.9)

## 2023-08-17 LAB — LIPID PANEL
Cholesterol: 127 mg/dL (ref 0–200)
HDL: 42.2 mg/dL (ref >39.00)
LDL Cholesterol: 47 mg/dL (ref 0–99)
NonHDL: 85.22
Total CHOL/HDL Ratio: 3
Triglycerides: 189 mg/dL — ABNORMAL HIGH (ref 0.0–149.0)
VLDL: 37.8 mg/dL (ref 0.0–40.0)

## 2023-08-17 LAB — LDL CHOLESTEROL, DIRECT: Direct LDL: 77 mg/dL

## 2023-08-17 LAB — B12 AND FOLATE PANEL
Folate: 11.6 ng/mL (ref >5.9)
Vitamin B-12: 544 pg/mL (ref 211–911)

## 2023-08-17 LAB — TSH: TSH: 3.11 u[IU]/mL (ref 0.35–5.50)

## 2023-08-17 MED ORDER — TRAZODONE HCL 150 MG PO TABS: 150.0000 mg | ORAL_TABLET | Freq: Every day | ORAL | 0 refills | Status: DC

## 2023-08-17 MED ORDER — SPIRONOLACTONE 25 MG PO TABS: 25.0000 mg | ORAL_TABLET | Freq: Every day | ORAL | Status: DC

## 2023-08-17 MED ORDER — METOPROLOL SUCCINATE ER 200 MG PO TB24: ORAL_TABLET | ORAL | 1 refills | Status: DC

## 2023-08-17 MED ORDER — PANTOPRAZOLE SODIUM 40 MG PO TBEC: 40.0000 mg | DELAYED_RELEASE_TABLET | Freq: Every day | ORAL | 1 refills | Status: DC

## 2023-08-17 NOTE — Assessment & Plan Note (Signed)
Patient is stable post discharge and has no new issues or questions about discharge plans at the visit today for hospital follow up. All labs , imaging studies and progress notes from admission were reviewed with patient today   

## 2023-08-17 NOTE — Assessment & Plan Note (Signed)
Well controlled on current regimen. Renal function stable, no changes today.   Lab Results  Component Value Date   CREATININE 1.07 08/17/2023   Lab Results  Component Value Date   NA 135 08/17/2023   K 5.0 08/17/2023   CL 101 08/17/2023   CO2 26 08/17/2023

## 2023-08-17 NOTE — Assessment & Plan Note (Signed)
history of statin myalgias.Patient is intolerant of  statin therapy for CAD risk reduction but it tolerating a PCSK9 (Praluent).  LDL is at goal / Lab Results  Component Value Date   ALT 16 08/17/2023   AST 14 08/17/2023   ALKPHOS 48 08/17/2023   BILITOT 0.5 08/17/2023   Lab Results  Component Value Date   CHOL 127 08/17/2023   HDL 42.20 08/17/2023   LDLCALC 47 08/17/2023   LDLDIRECT 77.0 08/17/2023   TRIG 189.0 (H) 08/17/2023   CHOLHDL 3 08/17/2023

## 2023-08-17 NOTE — Patient Instructions (Addendum)
  You might want to try  adding Relaxium for insomnia  (as seen on TV commercials) . It contains:  Melatonin 5 mg  Chamomile 25 mg Passionflower extract 75 mg GABA 100 mg Ashwaganda extract 125 mg Magnesium citrate, glycinate, oxide (100 mg)  L tryptophan 500 mg Valerest (proprietary  ingredient ; probably valeria root extract)     The Tdap (tetanus-diphtheria-whooping cough vaccine )  is overdue and   COVERED BY MEDICARE if you get them at your pharmacy for FREE

## 2023-08-17 NOTE — Assessment & Plan Note (Addendum)
She remains well-controlled and is now tolerating Jardiance  supplied via [facility]  pharmaceutical assistance. . Patient is intolerant of  statin therapy for CAD risk reduction but is tolerating a PCSK9 (Praluent) and Is taking valsartan. Lab Results  Component Value Date   HGBA1C 6.0 08/09/2023     Lab Results  Component Value Date   ALT 16 08/17/2023   AST 14 08/17/2023   ALKPHOS 48 08/17/2023   BILITOT 0.5 08/17/2023   Lab Results  Component Value Date   CHOL 127 08/17/2023   HDL 42.20 08/17/2023   LDLCALC 47 08/17/2023   LDLDIRECT 77.0 08/17/2023   TRIG 189.0 (H) 08/17/2023   CHOLHDL 3 08/17/2023

## 2023-08-17 NOTE — Progress Notes (Signed)
Subjective:  Patient ID: Stacie Mcdonald, female    DOB: May 14, 1946  Age: 77 y.o. MRN: [id number]  CC: The primary encounter diagnosis was Essential hypertension. Diagnoses of Hyperlipidemia associated with type 2 diabetes mellitus (HCC), Type 2 diabetes mellitus with hyperlipidemia (HCC), Fatigue, unspecified type, Hyperlipidemia with target low density lipoprotein (LDL) cholesterol less than 70 mg/dL, and Hospital discharge follow-up were also pertinent to this visit.   HPI Stacie Mcdonald presents for  Chief Complaint  Patient presents with  . Hospitalization Follow-up   Storie is a 77 yr old female with CADD status post PCI, hypertension, angina on Ranexa, GERD, who presented to [facility] ED or chief concerns of chest pain on Sept 21.   Patient is status post left heart cath on 06/25/2023, per cardiac cath note successful PCI to mid RCA and distal RCA. 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 And  Underwent LHC 08/10/23 which showed sever recurrent ISR of RCA now and underwent shockwave lithotripsy to the coronary placque, and place,ent of another  DES x 1 to RCA. She was trialled on Brilinta given concern for clopidogrel resistance, but unable to tolerate this due to shortness of breath and was switched back to clopidogrel.   She feels "not great"  and endorses no chest pain but endorses profound fatigue.  Returned to cardiac rehab yesterday ,  had 1 hour of PT   left her really tired but without chest pain  She has had difficulty maintaining sleep past 2 am 3 am wakeups.  No nocturia. Chronic.  Uses trazodone  150 mg and sleeping from 8:30 to 2:30   Type 2 DM: she is not  exercising regularly or trying to lose weight. Checking  blood sugars less than once daily at variable times, usually only if she feels she may be having a hypoglycemic event. .  BS have been under 130 fasting and < 150 post  prandially.  Denies any recent hypoglyemic events.  Taking   medications as directed. Following a carbohydrate modified diet 6 days per week. Denies numbness, burning and tingling of extremities. Appetite is good.    HLD: taking praluent last LDL was 68 in August.  Last dose of praluent  was sept 15   next dose due today but coming via [facility] form [facility]    Outpatient Medications Prior to Visit  Medication Sig Dispense Refill  . albuterol (VENTOLIN HFA) 108 (90 Base) MCG/ACT inhaler Inhale 2 puffs into the lungs every 6 (six) hours as needed. Using in Am daily  per MD    . aspirin 81 MG tablet Take 81 mg by mouth daily.    . clopidogrel (PLAVIX) 75 MG tablet TAKE ONE TABLET BY MOUTH DAILY (Patient taking differently: Take 75 mg by mouth daily.) 90 tablet 1  . D-MANNOSE PO Take 800 mg by mouth daily.    Marland Kitchen ELDERBERRY PO Take 1,000 mg by mouth daily.    . empagliflozin (JARDIANCE) 10 MG TABS tablet Take 1 tablet (10 mg total) by mouth daily before breakfast. 90 tablet 2  . furosemide (LASIX) 40 MG tablet Take 40 mg by mouth daily.    Marland Kitchen GLUCOSAMINE-CHONDROITIN PO Take 2 tablets by mouth daily.    . isosorbide mononitrate (IMDUR) 120 MG 24 hr tablet Take 120 mg by mouth daily.     Marland Kitchen loratadine (CLARITIN) 10 MG tablet Take 10 mg by mouth daily  as needed for allergies.    . nitroGLYCERIN (NITROSTAT) 0.4 MG SL tablet Place 0.4 mg under the tongue every 5 (five) minutes as needed for chest pain.    Marland Kitchen PRALUENT 75 MG/ML SOAJ Inject 75 mg into the muscle every 14 (fourteen) days.    . ranolazine (RANEXA) 500 MG 12 hr tablet Take 500 mg by mouth 2 (two) times daily.    . sacubitril-valsartan (ENTRESTO) 49-51 MG Take 1 tablet by mouth 2 (two) times daily.    . metoprolol (TOPROL-XL) 200 MG 24 hr tablet TAKE 1 TABLET BY MOUTH DAILY. (Patient taking differently: Take 200 mg by mouth daily. TAKE 1 TABLET BY MOUTH DAILY.) 90 tablet 1  . omeprazole (PRILOSEC) 20 MG capsule Take 1 capsule (20 mg total) by mouth daily.  (Patient taking differently: Take 20 mg by mouth daily as needed (acid reflux).) 90 capsule 1  . spironolactone (ALDACTONE) 25 MG tablet Take 1 tablet (25 mg total) by mouth daily. 90 tablet 0  . traZODone (DESYREL) 150 MG tablet Take 1 tablet (150 mg total) by mouth at bedtime. 90 tablet 0  . heparin 24401 UT/250ML infusion Inject 1,100 Units/hr into the vein continuous. (Patient not taking: Reported on 08/12/2023)    . OVER THE COUNTER MEDICATION Take 50 mg by mouth daily. Blood sugar support with Pyerogel (Patient not taking: Reported on 08/17/2023)     No facility-administered medications prior to visit.    Review of Systems;  Patient denies headache, fevers, malaise, unintentional weight loss, skin rash, eye pain, sinus congestion and sinus pain, sore throat, dysphagia,  hemoptysis , cough, dyspnea, wheezing, chest pain, palpitations, orthopnea, edema, abdominal pain, nausea, melena, diarrhea, constipation, flank pain, dysuria, hematuria, urinary  Frequency, nocturia, numbness, tingling, seizures,  Focal weakness, Loss of consciousness,  Tremor, insomnia, depression, anxiety, and suicidal ideation.      Objective:  BP 96/66   Pulse 80   Ht 5\' 4"  (1.626 m)   Wt 173 lb (78.5 kg)   SpO2 95%   BMI 29.70 kg/m   BP Readings from Last 3 Encounters:  08/17/23 96/66  08/09/23 (!) 117/56  06/26/23 (!) 104/51    Wt Readings from Last 3 Encounters:  08/17/23 173 lb (78.5 kg)  08/07/23 178 lb (80.7 kg)  07/15/23 181 lb 1.6 oz (82.1 kg)    Physical Exam Vitals reviewed.  Constitutional:      General: She is not in acute distress.    Appearance: Normal appearance. She is normal weight. She is not ill-appearing, toxic-appearing or diaphoretic.  HENT:     Head: Normocephalic.  Eyes:     General: No scleral icterus.       Right eye: No discharge.        Left eye: No discharge.     Conjunctiva/sclera: Conjunctivae normal.  Cardiovascular:     Rate and Rhythm: Normal rate and regular  rhythm.     Heart sounds: Normal heart sounds.  Pulmonary:     Effort: Pulmonary effort is normal. No respiratory distress.     Breath sounds: Normal breath sounds.  Musculoskeletal:        General: Normal range of motion.  Skin:    General: Skin is warm and dry.  Neurological:     General: No focal deficit present.     Mental Status: She is alert and oriented to person, place, and time. Mental status is at baseline.  Psychiatric:        Mood and Affect: Mood normal.  Behavior: Behavior normal.        Thought Content: Thought content normal.        Judgment: Judgment normal.   Lab Results  Component Value Date   HGBA1C 6.0 08/09/2023   HGBA1C 6.3 11/23/2022   HGBA1C 6.6 (H) 05/06/2022    Lab Results  Component Value Date   CREATININE 1.07 08/17/2023   CREATININE 0.74 08/08/2023   CREATININE 0.77 08/07/2023    Lab Results  Component Value Date   WBC 6.6 08/17/2023   HGB 13.5 08/17/2023   HCT 41.7 08/17/2023   PLT 288.0 08/17/2023   GLUCOSE 106 (H) 08/17/2023   CHOL 127 08/17/2023   TRIG 189.0 (H) 08/17/2023   HDL 42.20 08/17/2023   LDLDIRECT 77.0 08/17/2023   LDLCALC 47 08/17/2023   ALT 16 08/17/2023   AST 14 08/17/2023   NA 135 08/17/2023   K 5.0 08/17/2023   CL 101 08/17/2023   CREATININE 1.07 08/17/2023   BUN 22 08/17/2023   CO2 26 08/17/2023   TSH 3.11 08/17/2023   INR 1.0 08/07/2023   HGBA1C 6.0 08/09/2023   MICROALBUR <0.7 08/17/2023    No results found.  Assessment & Plan:  .Essential hypertension Assessment & Plan: Well controlled on current regimen. Renal function stable, no changes today.   Lab Results  Component Value Date   CREATININE 1.07 08/17/2023   Lab Results  Component Value Date   NA 135 08/17/2023   K 5.0 08/17/2023   CL 101 08/17/2023   CO2 26 08/17/2023     Orders: -     Microalbumin / creatinine urine ratio -     Comprehensive metabolic panel  Hyperlipidemia associated with type 2 diabetes mellitus  (HCC) Assessment & Plan:   She remains well-controlled and is now tolerating Jardiance  supplied via [facility]  pharmaceutical assistance. . Patient is intolerant of  statin therapy for CAD risk reduction but is tolerating a PCSK9 (Praluent) and Is taking valsartan. Lab Results  Component Value Date   HGBA1C 6.0 08/09/2023     Lab Results  Component Value Date   ALT 16 08/17/2023   AST 14 08/17/2023   ALKPHOS 48 08/17/2023   BILITOT 0.5 08/17/2023   Lab Results  Component Value Date   CHOL 127 08/17/2023   HDL 42.20 08/17/2023   LDLCALC 47 08/17/2023   LDLDIRECT 77.0 08/17/2023   TRIG 189.0 (H) 08/17/2023   CHOLHDL 3 08/17/2023     Orders: -     Lipid panel -     LDL cholesterol, direct  Type 2 diabetes mellitus with hyperlipidemia (HCC) -     Microalbumin / creatinine urine ratio -     Comprehensive metabolic panel  Fatigue, unspecified type -     TSH -     CBC with Differential/Platelet -     B12 and Folate Panel -     IBC + Ferritin  Hyperlipidemia with target low density lipoprotein (LDL) cholesterol less than 70 mg/dL Assessment & Plan:  history of statin myalgias.Patient is intolerant of  statin therapy for CAD risk reduction but it tolerating a PCSK9 (Praluent).  LDL is at goal / Lab Results  Component Value Date   ALT 16 08/17/2023   AST 14 08/17/2023   ALKPHOS 48 08/17/2023   BILITOT 0.5 08/17/2023   Lab Results  Component Value Date   CHOL 127 08/17/2023   HDL 42.20 08/17/2023   LDLCALC 47 08/17/2023   LDLDIRECT 77.0 08/17/2023   TRIG 189.0 (H)  08/17/2023   CHOLHDL 3 08/17/2023      Hospital discharge follow-up Assessment & Plan: Patient is stable post discharge and has no new issues or questions about discharge plans at the visit today for hospital follow up. All labs , imaging studies and progress notes from admission were reviewed with patient today      Other orders -     Metoprolol Succinate ER; TAKE 1 TABLET BY MOUTH DAILY.  Dispense: 90  tablet; Refill: 1 -     Spironolactone; Take 1 tablet (25 mg total) by mouth daily. -     traZODone HCl; Take 1 tablet (150 mg total) by mouth at bedtime.  Dispense: 90 tablet; Refill: 0 -     Pantoprazole Sodium; Take 1 tablet (40 mg total) by mouth daily.  Dispense: 90 tablet; Refill: 1     I provided 30 minutes of face-to-face time during this encounter reviewing patient's last visit with me, patient's  most recent visit with cardiology,  nephrology,  and neurology,  recent surgical and non surgical procedures, previous  labs and imaging studies, counseling on currently addressed issues,  and post visit ordering to diagnostics and therapeutics .   Follow-up: Return in about 5 months (around 01/15/2024) for follow up diabetes.   Sherlene Shams, MD

## 2023-08-18 ENCOUNTER — Encounter: Payer: Medicare Other | Attending: Internal Medicine | Admitting: *Deleted

## 2023-08-18 ENCOUNTER — Encounter: Payer: Self-pay | Admitting: *Deleted

## 2023-08-18 DIAGNOSIS — Z48812 Encounter for surgical aftercare following surgery on the circulatory system: Secondary | ICD-10-CM | POA: Insufficient documentation

## 2023-08-18 DIAGNOSIS — Z9861 Coronary angioplasty status: Secondary | ICD-10-CM

## 2023-08-18 NOTE — Progress Notes (Signed)
Cardiac Individual Treatment Plan  Patient Details  Name: Stacie Mcdonald MRN: [id number] Date of Birth: 01/17/1946 Referring Provider:   Flowsheet Row Cardiac Rehab from 07/15/2023 in [facility] Cardiac and Pulmonary Rehab  Referring Provider Dr. Dorothyann Peng, MD       Initial Encounter Date:  Flowsheet Row Cardiac Rehab from 07/15/2023 in [facility] Cardiac and Pulmonary Rehab  Date 07/15/23       Visit Diagnosis: S/P PTCA (percutaneous transluminal coronary angioplasty)  Patient's Home Medications on Admission:  Current Outpatient Medications:    albuterol (VENTOLIN HFA) 108 (90 Base) MCG/ACT inhaler, Inhale 2 puffs into the lungs every 6 (six) hours as needed. Using in Am daily  per MD, Disp: , Rfl:    aspirin 81 MG tablet, Take 81 mg by mouth daily., Disp: , Rfl:    clopidogrel (PLAVIX) 75 MG tablet, TAKE ONE TABLET BY MOUTH DAILY (Patient taking differently: Take 75 mg by mouth daily.), Disp: 90 tablet, Rfl: 1   D-MANNOSE PO, Take 800 mg by mouth daily., Disp: , Rfl:    ELDERBERRY PO, Take 1,000 mg by mouth daily., Disp: , Rfl:    empagliflozin (JARDIANCE) 10 MG TABS tablet, Take 1 tablet (10 mg total) by mouth daily before breakfast., Disp: 90 tablet, Rfl: 2   furosemide (LASIX) 40 MG tablet, Take 40 mg by mouth daily., Disp: , Rfl:    GLUCOSAMINE-CHONDROITIN PO, Take 2 tablets by mouth daily., Disp: , Rfl:    isosorbide mononitrate (IMDUR) 120 MG 24 hr tablet, Take 120 mg by mouth daily. , Disp: , Rfl:    loratadine (CLARITIN) 10 MG tablet, Take 10 mg by mouth daily as needed for allergies., Disp: , Rfl:    metoprolol (TOPROL-XL) 200 MG 24 hr tablet, TAKE 1 TABLET BY MOUTH DAILY., Disp: 90 tablet, Rfl: 1   nitroGLYCERIN (NITROSTAT) 0.4 MG SL tablet, Place 0.4 mg under the tongue every 5 (five) minutes as needed for chest pain., Disp: , Rfl:    pantoprazole (PROTONIX) 40 MG tablet, Take 1 tablet (40 mg total) by mouth daily., Disp: 90 tablet, Rfl: 1   PRALUENT 75 MG/ML SOAJ, Inject 75  mg into the muscle every 14 (fourteen) days., Disp: , Rfl:    ranolazine (RANEXA) 500 MG 12 hr tablet, Take 500 mg by mouth 2 (two) times daily., Disp: , Rfl:    sacubitril-valsartan (ENTRESTO) 49-51 MG, Take 1 tablet by mouth 2 (two) times daily., Disp: , Rfl:    spironolactone (ALDACTONE) 25 MG tablet, Take 1 tablet (25 mg total) by mouth daily., Disp: , Rfl:    traZODone (DESYREL) 150 MG tablet, Take 1 tablet (150 mg total) by mouth at bedtime., Disp: 90 tablet, Rfl: 0  Past Medical History: Past Medical History:  Diagnosis Date   Allergy    Arthritis    fingers, knees   Asthma    Breast Cancer 1991   R mastectomy. 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   Sleep apnea    CPAP   Stable angina (HCC) 07/27/2019   Thoracic vertebral fracture (HCC) 04/05/2012   Urinary tract infection    Wears contact lenses    left only    Tobacco Use: Social History   Tobacco Use  Smoking Status Never  Smokeless Tobacco Never    Labs: Review Flowsheet  More data exists      Latest Ref Rng & Units 11/04/2021 05/06/2022 11/23/2022 08/09/2023 08/17/2023  Labs for ITP Cardiac and Pulmonary Rehab  Cholestrol 0 - 200 mg/dL 161  096  045  - 409   LDL (calc) 0 - 99 mg/dL 38  58  46  - 47   Direct LDL mg/dL - 81.1  91.4  - 78.2   HDL-C >39.00 mg/dL 95.62  13.08  65.78  - 42.20   Trlycerides 0.0 - 149.0 mg/dL 469.6   295.2  841.3  - 189.0   Hemoglobin A1c - 6.5  6.6  6.3  6.0     -    Details       This result is from an external source.          Exercise Target Goals: Exercise Program Goal: Individual exercise prescription set using results from initial 6 min walk test and THRR while considering  patient's activity barriers and safety.   Exercise Prescription Goal: Initial exercise prescription builds to 30-45 minutes a day of aerobic activity, 2-3 days per week.  Home exercise guidelines will be given to patient during program as part of exercise prescription that the participant will acknowledge.   Education: Aerobic Exercise: - Group verbal and visual presentation on the components of exercise prescription. Introduces F.I.T.T principle from ACSM for exercise prescriptions.  Reviews F.I.T.T. principles of aerobic exercise including progression. Written material given at graduation. Flowsheet Row Cardiac Rehab from 08/18/2018 in [facility] Cardiac and Pulmonary Rehab  Date 08/18/18  Educator [facility]  Instruction Review Code 5- Refused Teaching       Education: Resistance Exercise: - Group verbal and visual presentation on the components of exercise prescription. Introduces F.I.T.T principle from ACSM for exercise prescriptions  Reviews F.I.T.T. principles of resistance exercise including progression. Written material given at graduation.    Education: Exercise & Equipment Safety: - Individual verbal instruction and demonstration of equipment use and safety with use of the equipment. Flowsheet Row Cardiac Rehab from 07/21/2023 in [facility] Cardiac and Pulmonary Rehab  Date 07/15/23  Educator NT  Instruction Review Code 1- Verbalizes Understanding       Education: Exercise Physiology & General Exercise Guidelines: - Group verbal and written instruction with models to review the exercise physiology of the cardiovascular system and associated critical values. Provides general exercise guidelines with  specific guidelines to those with heart or lung disease.  Flowsheet Row Cardiac Rehab from 08/18/2018 in [facility] Cardiac and Pulmonary Rehab  Date 08/11/18  Educator [facility]  Instruction Review Code 1- Verbalizes Understanding       Education: Flexibility, Balance, Mind/Body Relaxation: - Group verbal and visual presentation with interactive activity on the components of exercise prescription. Introduces F.I.T.T principle from ACSM for exercise prescriptions. Reviews F.I.T.T. principles of flexibility and balance exercise training including progression. Also discusses the mind body connection.  Reviews various relaxation techniques to help reduce and manage stress (i.e. Deep breathing, progressive muscle relaxation, and visualization).  Balance handout provided to take home. Written material given at graduation. Flowsheet Row Cardiac Rehab from 08/18/2018 in [facility] Cardiac and Pulmonary Rehab  Date 06/28/18  Educator AS  Instruction Review Code 5- Refused Teaching       Activity Barriers & Risk Stratification:  Activity Barriers & Cardiac Risk Stratification - 07/15/23 1439       Activity Barriers & Cardiac Risk Stratification   Activity Barriers Shortness of Breath;Chest Pain/Angina;Deconditioning;Muscular Weakness    Cardiac Risk Stratification High             6 Minute Walk:  6 Minute Walk     Row Name 07/15/23 1438         6 Minute Walk   Phase Initial     Distance 1055 feet     Walk Time 6 minutes     # of Rest Breaks 0     MPH 1.99     METS 1.92     RPE 15     Perceived Dyspnea  2     VO2 Peak 6.72     Symptoms Yes (comment)     Comments SOB, leg weakness     Resting HR 80 bpm     Resting BP 124/68     Resting Oxygen Saturation  93 %     Exercise Oxygen Saturation  during 6 min walk 93 %     Max Ex. HR 108 bpm     Max Ex. BP 142/74     2 Minute Post BP 120/68              Oxygen Initial Assessment:   Oxygen Re-Evaluation:   Oxygen Discharge (Final  Oxygen Re-Evaluation):   Initial Exercise Prescription:  Initial Exercise Prescription - 07/15/23 1400       Date of Initial Exercise RX and Referring Provider   Date 07/15/23    Referring Provider Dr. Dorothyann Peng, MD      Oxygen   Maintain Oxygen Saturation 88% or higher      Treadmill   MPH 1.4    Grade 0    Minutes 15    METs 2.07      NuStep   Level 2    SPM 80    Minutes 15    METs 1.92      Biostep-RELP   Level 1    SPM 50    Minutes 15    METs 1.92      Prescription Details   Frequency (times per week) 3    Duration Progress to 30 minutes of continuous aerobic without signs/symptoms of physical distress      Intensity   THRR 40-80% of Max Heartrate 104-129    Ratings of Perceived Exertion 11-13    Perceived Dyspnea 0-4      Progression   Progression Continue to progress workloads to maintain intensity without signs/symptoms of physical distress.      Resistance Training   Training Prescription Yes    Weight 3 lb    Reps 10-15             Perform Capillary Blood Glucose checks as needed.  Exercise Prescription Changes:   Exercise Prescription Changes     Row Name 07/15/23 1400 07/29/23 1300 08/10/23 1400         Response to Exercise   Blood Pressure (Admit) 124/68 102/58 124/62     Blood Pressure (Exercise) 142/74 134/64 126/66     Blood Pressure (Exit) 120/68 106/60  130/70     Heart Rate (Admit) 80 bpm 93 bpm 84 bpm     Heart Rate (Exercise) 108 bpm 107 bpm 112 bpm     Heart Rate (Exit) 87 bpm 89 bpm 84 bpm     Oxygen Saturation (Admit) 93 % -- --     Oxygen Saturation (Exercise) 93 % -- --     Rating of Perceived Exertion (Exercise) 15 14 15      Perceived Dyspnea (Exercise) 2 -- --     Symptoms SOB, leg weakness none none     Comments Results First two days of exercise --     Duration -- Progress to 30 minutes of  aerobic without signs/symptoms of physical distress Progress to 30 minutes of  aerobic without signs/symptoms  of physical distress     Intensity -- THRR unchanged THRR unchanged       Progression   Progression -- Continue to progress workloads to maintain intensity without signs/symptoms of physical distress. Continue to progress workloads to maintain intensity without signs/symptoms of physical distress.     Average METs -- 2.04 2.04       Resistance Training   Training Prescription -- Yes Yes     Weight -- 3 lb 3 lb     Reps -- 10-15 10-15       Interval Training   Interval Training -- No No       Treadmill   MPH -- 1.4 1.4     Grade -- 0 0     Minutes -- 15 15     METs -- 2.07 2.07       NuStep   Level -- 3 4     Minutes -- 15 15     METs -- 1.8 2       Biostep-RELP   Level -- -- 1     Minutes -- -- 15     METs -- -- 2       Oxygen   Maintain Oxygen Saturation -- 88% or higher 88% or higher              Exercise Comments:   Exercise Comments     Row Name 07/15/23 1419           Exercise Comments First full day of exercise!  Patient was oriented to gym and equipment including functions, settings, policies, and procedures.  Patient's individual exercise prescription and treatment plan were reviewed.  All starting workloads were established based on the results of the 6 minute walk test done at initial orientation visit.  The plan for exercise progression was also introduced and progression will be customized based on patient's performance and goals.                Exercise Goals and Review:   Exercise Goals     Row Name 07/15/23 1440             Exercise Goals   Increase Physical Activity Yes       Intervention Provide advice, education, support and counseling about physical activity/exercise needs.;Develop an individualized exercise prescription for aerobic and resistive training based on initial evaluation findings, risk stratification, comorbidities and participant's personal goals.       Expected Outcomes Short Term: Attend rehab on a regular basis  to increase amount of physical activity.;Long Term: Add in home exercise to make exercise part of routine and to increase amount of physical activity.;Long Term: Exercising regularly at least 3-5 days  a week.       Increase Strength and Stamina Yes       Intervention Provide advice, education, support and counseling about physical activity/exercise needs.;Develop an individualized exercise prescription for aerobic and resistive training based on initial evaluation findings, risk stratification, comorbidities and participant's personal goals.       Expected Outcomes Short Term: Increase workloads from initial exercise prescription for resistance, speed, and METs.;Short Term: Perform resistance training exercises routinely during rehab and add in resistance training at home;Long Term: Improve cardiorespiratory fitness, muscular endurance and strength as measured by increased METs and functional capacity ( )       Able to understand and use rate of perceived exertion (RPE) scale Yes       Intervention Provide education and explanation on how to use RPE scale       Expected Outcomes Short Term: Able to use RPE daily in rehab to express subjective intensity level;Long Term:  Able to use RPE to guide intensity level when exercising independently       Able to understand and use Dyspnea scale Yes       Intervention Provide education and explanation on how to use Dyspnea scale       Expected Outcomes Short Term: Able to use Dyspnea scale daily in rehab to express subjective sense of shortness of breath during exertion;Long Term: Able to use Dyspnea scale to guide intensity level when exercising independently       Knowledge and understanding of Target Heart Rate Range (THRR) Yes       Intervention Provide education and explanation of THRR including how the numbers were predicted and where they are located for reference       Expected Outcomes Short Term: Able to state/look up THRR;Short Term: Able to use  daily as guideline for intensity in rehab;Long Term: Able to use THRR to govern intensity when exercising independently       Able to check pulse independently Yes       Intervention Provide education and demonstration on how to check pulse in carotid and radial arteries.;Review the importance of being able to check your own pulse for safety during independent exercise       Expected Outcomes Short Term: Able to explain why pulse checking is important during independent exercise;Long Term: Able to check pulse independently and accurately       Understanding of Exercise Prescription Yes       Intervention Provide education, explanation, and written materials on patient's individual exercise prescription       Expected Outcomes Short Term: Able to explain program exercise prescription;Long Term: Able to explain home exercise prescription to exercise independently                Exercise Goals Re-Evaluation :  Exercise Goals Re-Evaluation     Row Name 07/21/23 1420 07/29/23 1333 08/10/23 1414         Exercise Goal Re-Evaluation   Exercise Goals Review Increase Physical Activity;Able to understand and use rate of perceived exertion (RPE) scale;Understanding of Exercise Prescription;Increase Strength and Stamina;Able to check pulse independently Increase Physical Activity;Increase Strength and Stamina;Understanding of Exercise Prescription Increase Physical Activity;Increase Strength and Stamina;Understanding of Exercise Prescription     Comments Reviewed RPE  and dyspnea scale, THR and program prescription with pt today.  Pt voiced understanding and was given a copy of goals to take home. Pixie is off to a good start in the program. She was able to walk the treadmill at  a speed of 1.4 mph with no incline during her first two sessions. She also improved from level 2 to level 3 on the T4 nustep. We will continue to monitor her progress in the program. Shaquandra is doing well in the program. She has  continued to walk on the treadmill at a speed of 1.4 mph with no incline. She also improved to level 4 on the T4 nustep and began using the biostep at level 1. We will continue to monitor her progress in the program.     Expected Outcomes Short: Use RPE daily to regulate intensity.  Long: Follow program prescription in THR. Short: Continue to follow current exercise prescription. Long: Continue exercise to improve strength and stamina. Short: Begin to progressively increase treadmill workload. Long: Continue exercise to improve strength and stamina.              Discharge Exercise Prescription (Final Exercise Prescription Changes):  Exercise Prescription Changes - 08/10/23 1400       Response to Exercise   Blood Pressure (Admit) 124/62    Blood Pressure (Exercise) 126/66    Blood Pressure (Exit) 130/70    Heart Rate (Admit) 84 bpm    Heart Rate (Exercise) 112 bpm    Heart Rate (Exit) 84 bpm    Rating of Perceived Exertion (Exercise) 15    Symptoms none    Duration Progress to 30 minutes of  aerobic without signs/symptoms of physical distress    Intensity THRR unchanged      Progression   Progression Continue to progress workloads to maintain intensity without signs/symptoms of physical distress.    Average METs 2.04      Resistance Training   Training Prescription Yes    Weight 3 lb    Reps 10-15      Interval Training   Interval Training No      Treadmill   MPH 1.4    Grade 0    Minutes 15    METs 2.07      NuStep   Level 4    Minutes 15    METs 2      Biostep-RELP   Level 1    Minutes 15    METs 2      Oxygen   Maintain Oxygen Saturation 88% or higher             Nutrition:  Target Goals: Understanding of nutrition guidelines, daily intake of sodium 1500mg , cholesterol 200mg , calories 30% from fat and 7% or less from saturated fats, daily to have 5 or more servings of fruits and vegetables.  Education: All About Nutrition: -Group instruction  provided by verbal, written material, interactive activities, discussions, models, and posters to present general guidelines for heart healthy nutrition including fat, fiber, MyPlate, the role of sodium in heart healthy nutrition, utilization of the nutrition label, and utilization of this knowledge for meal planning. Follow up email sent as well. Written material given at graduation. Flowsheet Row Cardiac Rehab from 07/21/2023 in [facility] Cardiac and Pulmonary Rehab  Date 07/21/23  Educator JG  Instruction Review Code 1- Verbalizes Understanding       Biometrics:  Pre Biometrics - 07/15/23 1440       Pre Biometrics   Height 5' 3.5" (1.613 m)    Weight 181 lb 1.6 oz (82.1 kg)    Waist Circumference 40 inches    Hip Circumference 42.5 inches    Waist to Hip Ratio 0.94 %    BMI (Calculated) 31.57  Single Leg Stand 8.3 seconds              Nutrition Therapy Plan and Nutrition Goals:   Nutrition Assessments:  MEDIFICTS Score Key: >=70 Need to make dietary changes  40-70 Heart Healthy Diet <= 40 Therapeutic Level Cholesterol Diet  Flowsheet Row Cardiac Rehab from 07/21/2023 in [facility] Cardiac and Pulmonary Rehab  Picture Your Plate Total Score on Admission 54      Picture Your Plate Scores: <40 Unhealthy dietary pattern with much room for improvement. 41-50 Dietary pattern unlikely to meet recommendations for good health and room for improvement. 51-60 More healthful dietary pattern, with some room for improvement.  >60 Healthy dietary pattern, although there may be some specific behaviors that could be improved.    Nutrition Goals Re-Evaluation:   Nutrition Goals Discharge (Final Nutrition Goals Re-Evaluation):   Psychosocial: Target Goals: Acknowledge presence or absence of significant depression and/or stress, maximize coping skills, provide positive support system. Participant is able to verbalize types and ability to use techniques and skills needed for reducing  stress and depression.   Education: Stress, Anxiety, and Depression - Group verbal and visual presentation to define topics covered.  Reviews how body is impacted by stress, anxiety, and depression.  Also discusses healthy ways to reduce stress and to treat/manage anxiety and depression.  Written material given at graduation. Flowsheet Row Cardiac Rehab from 08/18/2018 in [facility] Cardiac and Pulmonary Rehab  Date 08/16/18  Educator [facility]  Instruction Review Code 1- [employer] Understanding       Education: Sleep Hygiene -Provides group verbal and written instruction about how sleep can affect your health.  Define sleep hygiene, discuss sleep cycles and impact of sleep habits. Review good sleep hygiene tips.  Flowsheet Row Cardiac Rehab from 08/18/2018 in [facility] Cardiac and Pulmonary Rehab  Date 07/19/18  Educator [facility]  Instruction Review Code 1- Verbalizes Understanding       Initial Review & Psychosocial Screening:  Initial Psych Review & Screening - 07/13/23 1317       Initial Review   Current issues with None Identified      Family Dynamics   Good Support System? Yes   husband, son and daughter in law, other daughter     Barriers   Psychosocial barriers to participate in program There are no identifiable barriers or psychosocial needs.      Screening Interventions   Interventions To provide support and resources with identified psychosocial needs;Provide feedback about the scores to participant;Encouraged to exercise    Expected Outcomes Short Term goal: Utilizing psychosocial counselor, staff and physician to assist with identification of specific Stressors or current issues interfering with healing process. Setting desired goal for each stressor or current issue identified.;Long Term Goal: Stressors or current issues are controlled or eliminated.;Short Term goal: Identification and review with participant of any Quality of Life or Depression concerns found by scoring the  questionnaire.;Long Term goal: The participant improves quality of Life and PHQ9 Scores as seen by post scores and/or verbalization of changes             Quality of Life Scores:   Quality of Life - 07/21/23 1443       Quality of Life   Select Quality of Life      Quality of Life Scores   Health/Function Pre 19 %    Socioeconomic Pre 25.57 %    Psych/Spiritual Pre 24 %    Family Pre 24 %    GLOBAL Pre 22.21 %  Scores of 19 and below usually indicate a poorer quality of life in these areas.  A difference of  2-3 points is a clinically meaningful difference.  A difference of 2-3 points in the total score of the Quality of Life Index has been associated with significant improvement in overall quality of life, self-image, physical symptoms, and general health in studies assessing change in quality of life.  PHQ-9: Review Flowsheet  More data exists      08/17/2023 07/15/2023 05/17/2023 01/29/2023 11/23/2022  Depression screen PHQ 2/9  Decreased Interest 0 0 0 0 0  Down, Depressed, Hopeless 0 0 0 0 0  PHQ - 2 Score 0 0 0 0 0  Altered sleeping - 0 1 0 -  Tired, decreased energy - 2 1 0 -  Change in appetite - 0 0 0 -  Feeling bad or failure about yourself  - 0 0 0 -  Trouble concentrating - 0 0 0 -  Moving slowly or fidgety/restless - 0 0 0 -  Suicidal thoughts - 0 0 0 -  PHQ-9 Score - 2 2 0 -  Difficult doing work/chores - Not difficult at all Not difficult at all Not difficult at all -    Details           Interpretation of Total Score  Total Score Depression Severity:  1-4 = Minimal depression, 5-9 = Mild depression, 10-14 = Moderate depression, 15-19 = Moderately severe depression, 20-27 = Severe depression   Psychosocial Evaluation and Intervention:  Psychosocial Evaluation - 07/13/23 1447       Psychosocial Evaluation & Interventions   Comments Lugenia has no barriers to attending the program. She lives with her husband. He and her chidren and daughter  in law are her support.  She is ready to start the program and work on her exercise progression to get back to her 3 x a week  group exercise class.    Expected Outcomes STG Attends all scheduled sessions and is able to work on her exercise progression to return to level prior to the PTCA. LTG Continue to progress with her exericse without symptoms    Continue Psychosocial Services  Follow up required by staff             Psychosocial Re-Evaluation:   Psychosocial Discharge (Final Psychosocial Re-Evaluation):   Vocational Rehabilitation: Provide vocational rehab assistance to qualifying candidates.   Vocational Rehab Evaluation & Intervention:  Vocational Rehab - 07/13/23 1319       Initial Vocational Rehab Evaluation & Intervention   Assessment shows need for Vocational Rehabilitation No      Vocational Rehab Re-Evaulation   Comments retired             Education: Education Goals: Education classes will be provided on a variety of topics geared toward better understanding of heart health and risk factor modification. Participant will state understanding/return demonstration of topics presented as noted by education test scores.  Learning Barriers/Preferences:  Learning Barriers/Preferences - 07/13/23 1319       Learning Barriers/Preferences   Learning Barriers None    Learning Preferences None             General Cardiac Education Topics:  AED/CPR: - Group verbal and written instruction with the use of models to demonstrate the basic use of the AED with the basic ABC's of resuscitation. Flowsheet Row Cardiac Rehab from 08/18/2018 in [facility] Cardiac and Pulmonary Rehab  Date 06/02/18  Educator SB  Instruction Review Code  1- Verbalizes Understanding       Anatomy and Cardiac Procedures: - Group verbal and visual presentation and models provide information about basic cardiac anatomy and function. Reviews the testing methods done to diagnose heart disease  and the outcomes of the test results. Describes the treatment choices: Medical Management, Angioplasty, or Coronary Bypass Surgery for treating various heart conditions including Myocardial Infarction, Angina, Valve Disease, and Cardiac Arrhythmias.  Written material given at graduation. Flowsheet Row Cardiac Rehab from 08/18/2018 in [facility] Cardiac and Pulmonary Rehab  Date 05/26/18  Educator SB  Instruction Review Code 5- Refused Teaching       Medication Safety: - Group verbal and visual instruction to review commonly prescribed medications for heart and lung disease. Reviews the medication, class of the drug, and side effects. Includes the steps to properly store meds and maintain the prescription regimen.  Written material given at graduation. Flowsheet Row Cardiac Rehab from 08/18/2018 in [facility] Cardiac and Pulmonary Rehab  Date 07/12/18  Educator SB  Instruction Review Code 5- Refused Teaching       Intimacy: - Group verbal instruction through game format to discuss how heart and lung disease can affect sexual intimacy. Written material given at graduation.. Flowsheet Row Cardiac Rehab from 08/18/2018 in [facility] Cardiac and Pulmonary Rehab  Date 08/09/18  Educator SB  Instruction Review Code 5- Refused Teaching       Know Your Numbers and Heart Failure: - Group verbal and visual instruction to discuss disease risk factors for cardiac and pulmonary disease and treatment options.  Reviews associated critical values for Overweight/Obesity, Hypertension, Cholesterol, and Diabetes.  Discusses basics of heart failure: signs/symptoms and treatments.  Introduces Heart Failure Zone chart for action plan for heart failure.  Written material given at graduation. Flowsheet Row Cardiac Rehab from 08/18/2018 in [facility] Cardiac and Pulmonary Rehab  Date 05/26/18  Educator SB  Instruction Review Code 5- Refused Teaching       Infection Prevention: - Provides verbal and written material to individual  with discussion of infection control including proper hand washing and proper equipment cleaning during exercise session. Flowsheet Row Cardiac Rehab from 07/21/2023 in [facility] Cardiac and Pulmonary Rehab  Date 07/15/23  Educator NT  Instruction Review Code 1- Verbalizes Understanding       Falls Prevention: - Provides verbal and written material to individual with discussion of falls prevention and safety. Flowsheet Row Cardiac Rehab from 07/21/2023 in [facility] Cardiac and Pulmonary Rehab  Date 07/13/23  Educator SB  Instruction Review Code 1- Verbalizes Understanding       Other: -Provides group and verbal instruction on various topics (see comments)   Knowledge Questionnaire Score:  Knowledge Questionnaire Score - 07/21/23 1441       Knowledge Questionnaire Score   Pre Score 26/26             Core Components/Risk Factors/Patient Goals at Admission:  Personal Goals and Risk Factors at Admission - 07/13/23 1320       Core Components/Risk Factors/Patient Goals on Admission    Weight Management Yes;Weight Loss    Intervention Weight Management: Develop a combined nutrition and exercise program designed to reach desired caloric intake, while maintaining appropriate intake of nutrient and fiber, sodium and fats, and appropriate energy expenditure required for the weight goal.;Weight Management: Provide education and appropriate resources to help participant work on and attain dietary goals.    Admit Weight 179 lb (81.2 kg)    Goal Weight: Short Term 178 lb (80.7 kg)  Goal Weight: Long Term 169 lb (76.7 kg)    Expected Outcomes Short Term: Continue to assess and modify interventions until short term weight is achieved;Long Term: Adherence to nutrition and physical activity/exercise program aimed toward attainment of established weight goal    Improve shortness of breath with ADL's Yes    Intervention Provide education, individualized exercise plan and daily activity instruction to  help decrease symptoms of SOB with activities of daily living.    Expected Outcomes Short Term: Improve cardiorespiratory fitness to achieve a reduction of symptoms when performing ADLs;Long Term: Be able to perform more ADLs without symptoms or delay the onset of symptoms    Diabetes Yes   A1C every 6 months   diet controlled   Intervention Provide education about signs/symptoms and action to take for hypo/hyperglycemia.;Provide education about proper nutrition, including hydration, and aerobic/resistive exercise prescription along with prescribed medications to achieve blood glucose in normal ranges: Fasting glucose 65-99 mg/dL    Expected Outcomes Short Term: Participant verbalizes understanding of the signs/symptoms and immediate care of hyper/hypoglycemia, proper foot care and importance of medication, aerobic/resistive exercise and nutrition plan for blood glucose control.;Long Term: Attainment of HbA1C < 7%.    Heart Failure Yes    Intervention Provide a combined exercise and nutrition program that is supplemented with education, support and counseling about heart failure. Directed toward relieving symptoms such as shortness of breath, decreased exercise tolerance, and extremity edema.    Expected Outcomes Improve functional capacity of life;Short term: Attendance in program 2-3 days a week with increased exercise capacity. Reported lower sodium intake. Reported increased fruit and vegetable intake. Reports medication compliance.;Short term: Daily weights obtained and reported for increase. Utilizing diuretic protocols set by physician.;Long term: Adoption of self-care skills and reduction of barriers for early signs and symptoms recognition and intervention leading to self-care maintenance.    Hypertension Yes    Intervention Provide education on lifestyle modifcations including regular physical activity/exercise, weight management, moderate sodium restriction and increased consumption of fresh  fruit, vegetables, and low fat dairy, alcohol moderation, and smoking cessation.;Monitor prescription use compliance.    Expected Outcomes Short Term: Continued assessment and intervention until BP is < 140/56mm HG in hypertensive participants. < 130/75mm HG in hypertensive participants with diabetes, heart failure or chronic kidney disease.;Long Term: Maintenance of blood pressure at goal levels.    Lipids Yes    Intervention Provide education and support for participant on nutrition & aerobic/resistive exercise along with prescribed medications to achieve LDL 70mg , HDL >40mg .    Expected Outcomes Short Term: Participant states understanding of desired cholesterol values and is compliant with medications prescribed. Participant is following exercise prescription and nutrition guidelines.;Long Term: Cholesterol controlled with medications as prescribed, with individualized exercise RX and with personalized nutrition plan. Value goals: LDL < 70mg , HDL > 40 mg.             Education:Diabetes - Individual verbal and written instruction to review signs/symptoms of diabetes, desired ranges of glucose level fasting, after meals and with exercise. Acknowledge that pre and post exercise glucose checks will be done for 3 sessions at entry of program.   Core Components/Risk Factors/Patient Goals Review:    Core Components/Risk Factors/Patient Goals at Discharge (Final Review):    ITP Comments:  ITP Comments     Row Name 07/13/23 1453 07/15/23 1426 07/21/23 1041 08/09/23 1629 08/18/23 1217   ITP Comments Virtual orientation call completed today. shehas an appointment on Date: 07/15/23  for EP eval and  gym Orientation.  Documentation of diagnosis can be found in [facility] 06/25/2023 . Completed and gym orientation. Initial ITP created and sent for review to Dr. Bethann Punches, Medical Director. 30 Day review completed. Medical Director ITP review done, changes made as directed, and signed approval by Medical  Director.     new to program Jailen called rehab today to let us know she went to ER at [facility] with angina and will be transported to [facility] for angioplasty. 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 Will place on medical hold until cardiac clearance received to resume rehab. 30 Day review completed. Medical Director ITP review done, changes made as directed, and signed approval by Medical Director.            Comments:

## 2023-08-18 NOTE — Progress Notes (Signed)
Daily Session Note  Patient Details  Name: Stacie Mcdonald MRN: [id number] Date of Birth: 02/21/1946 Referring Provider:   Flowsheet Row Cardiac Rehab from 07/15/2023 in [facility] Cardiac and Pulmonary Rehab  Referring Provider Dr. Dorothyann Peng, MD       Encounter Date: 08/18/2023  Check In:  Session Check In - 08/18/23 1424       Check-In   Supervising physician immediately available to respond to emergencies See telemetry face sheet for immediately available ER MD    Location [facility]-Cardiac & Pulmonary Rehab    Staff Present Ronette Deter, BS, Exercise Physiologist;Maxon Conetta BS, , Exercise Physiologist;Terralyn Matsumura Katrinka Blazing, RN, ADN    Virtual Visit No    Medication changes reported     No    Fall or balance concerns reported    No    Warm-up and Cool-down Performed on first and last piece of equipment    Resistance Training Performed Yes    VAD Patient? No    PAD/SET Patient? No      Pain Assessment   Currently in Pain? No/denies                Social History   Tobacco Use  Smoking Status Never  Smokeless Tobacco Never    Goals Met:  Independence with exercise equipment Exercise tolerated well No report of concerns or symptoms today Strength training completed today  Goals Unmet:  Not Applicable  Comments: Pt able to follow exercise prescription today without complaint.  Will continue to monitor for progression.    Dr. Bethann Punches is Medical Director for [facility] Cardiac Rehabilitation.  Dr. Vida Rigger is Medical Director for [facility] Pulmonary Rehabilitation.

## 2023-08-24 ENCOUNTER — Ambulatory Visit: Payer: Medicare Other

## 2023-08-26 ENCOUNTER — Encounter: Payer: Medicare Other | Admitting: *Deleted

## 2023-08-26 DIAGNOSIS — I5042 Chronic combined systolic (congestive) and diastolic (congestive) heart failure: Secondary | ICD-10-CM | POA: Diagnosis not present

## 2023-08-26 DIAGNOSIS — I10 Essential (primary) hypertension: Secondary | ICD-10-CM | POA: Diagnosis not present

## 2023-08-26 DIAGNOSIS — I2510 Atherosclerotic heart disease of native coronary artery without angina pectoris: Secondary | ICD-10-CM | POA: Diagnosis not present

## 2023-08-26 DIAGNOSIS — Z48812 Encounter for surgical aftercare following surgery on the circulatory system: Secondary | ICD-10-CM | POA: Diagnosis not present

## 2023-08-26 DIAGNOSIS — Z9861 Coronary angioplasty status: Secondary | ICD-10-CM

## 2023-08-26 NOTE — Progress Notes (Signed)
Daily Session Note  Patient Details  Name: Stacie Mcdonald MRN: [id number] Date of Birth: 16-Apr-1946 Referring Provider:   Flowsheet Row Cardiac Rehab from 07/15/2023 in [facility] Cardiac and Pulmonary Rehab  Referring Provider Dr. Dorothyann Peng, MD       Encounter Date: 08/26/2023  Check In:  Session Check In - 08/26/23 0923       Check-In   Supervising physician immediately available to respond to emergencies See telemetry face sheet for immediately available ER MD    Location [facility]-Cardiac & Pulmonary Rehab    Staff Present Ronette Deter, BS, Exercise Physiologist;Joseph Piedmont, RCP,RRT,BSRT;Maxon Newport BS, , Exercise Physiologist;Inika Bellanger Katrinka Blazing, RN, [place]    Virtual Visit No    Medication changes reported     No    Fall or balance concerns reported    No    Warm-up and Cool-down Performed on first and last piece of equipment    Resistance Training Performed Yes    VAD Patient? No    PAD/SET Patient? No      Pain Assessment   Currently in Pain? No/denies                Social History   Tobacco Use  Smoking Status Never  Smokeless Tobacco Never    Goals Met:  Independence with exercise equipment Exercise tolerated well No report of concerns or symptoms today Strength training completed today  Goals Unmet:  Not Applicable  Comments: Pt able to follow exercise prescription today without complaint.  Will continue to monitor for progression.    Dr. Bethann Punches is Medical Director for [facility] Cardiac Rehabilitation.  Dr. Vida Rigger is Medical Director for [facility] Pulmonary Rehabilitation.

## 2023-08-27 ENCOUNTER — Encounter: Payer: Medicare Other | Admitting: *Deleted

## 2023-08-27 DIAGNOSIS — Z9861 Coronary angioplasty status: Secondary | ICD-10-CM

## 2023-08-27 DIAGNOSIS — Z48812 Encounter for surgical aftercare following surgery on the circulatory system: Secondary | ICD-10-CM | POA: Diagnosis not present

## 2023-08-27 NOTE — Progress Notes (Signed)
Daily Session Note  Patient Details  Name: Stacie Mcdonald MRN: [id number] Date of Birth: 1946/03/14 Referring Provider:   Flowsheet Row Cardiac Rehab from 07/15/2023 in [facility] Cardiac and Pulmonary Rehab  Referring Provider Dr. Dorothyann Peng, MD       Encounter Date: 08/27/2023  Check In:  Session Check In - 08/27/23 1030       Check-In   Supervising physician immediately available to respond to emergencies See telemetry face sheet for immediately available ER MD    Location [facility]-Cardiac & Pulmonary Rehab    Staff Present Rory Percy, MS, Exercise Physiologist;Lonnie Rosado Karleen Hampshire RN, BSN;Maxon Conetta BS, , Exercise Physiologist;Noah Tickle, BS, Exercise Physiologist    Virtual Visit No    Medication changes reported     No    Fall or balance concerns reported    No    Warm-up and Cool-down Performed on first and last piece of equipment    Resistance Training Performed Yes    VAD Patient? No    PAD/SET Patient? No      Pain Assessment   Currently in Pain? No/denies                Social History   Tobacco Use  Smoking Status Never  Smokeless Tobacco Never    Goals Met:  Independence with exercise equipment Exercise tolerated well No report of concerns or symptoms today Strength training completed today  Goals Unmet:  Not Applicable  Comments: Pt able to follow exercise prescription today without complaint.  Will continue to monitor for progression.    Dr. Bethann Punches is Medical Director for [facility] Cardiac Rehabilitation.  Dr. Vida Rigger is Medical Director for [facility] Pulmonary Rehabilitation.

## 2023-08-31 ENCOUNTER — Encounter: Payer: Medicare Other | Admitting: *Deleted

## 2023-08-31 DIAGNOSIS — Z48812 Encounter for surgical aftercare following surgery on the circulatory system: Secondary | ICD-10-CM | POA: Diagnosis not present

## 2023-08-31 DIAGNOSIS — Z9861 Coronary angioplasty status: Secondary | ICD-10-CM

## 2023-08-31 DIAGNOSIS — G4733 Obstructive sleep apnea (adult) (pediatric): Secondary | ICD-10-CM | POA: Diagnosis not present

## 2023-08-31 NOTE — Progress Notes (Signed)
Daily Session Note  Patient Details  Name: Stacie Mcdonald MRN: [id number] Date of Birth: 06-17-1946 Referring Provider:   Flowsheet Row Cardiac Rehab from 07/15/2023 in [facility] Cardiac and Pulmonary Rehab  Referring Provider Dr. Dorothyann Peng, MD       Encounter Date: 08/31/2023  Check In:  Session Check In - 08/31/23 0943       Check-In   Supervising physician immediately available to respond to emergencies See telemetry face sheet for immediately available ER MD    Location [facility]-Cardiac & Pulmonary Rehab    Staff Present Maxon Conetta BS, , Exercise Physiologist;Noah Tickle, BS, Exercise Physiologist;Margaret Best, MS, Exercise Physiologist;Tayanna Talford, RN, BSN, CCRP    Virtual Visit No    Medication changes reported     No    Fall or balance concerns reported    No    Warm-up and Cool-down Performed on first and last piece of equipment    Resistance Training Performed Yes    VAD Patient? No    PAD/SET Patient? No      Pain Assessment   Currently in Pain? No/denies                Social History   Tobacco Use  Smoking Status Never  Smokeless Tobacco Never    Goals Met:  Independence with exercise equipment Exercise tolerated well No report of concerns or symptoms today  Goals Unmet:  Not Applicable  Comments: Pt able to follow exercise prescription today without complaint.  Will continue to monitor for progression.    Dr. Bethann Punches is Medical Director for [facility] Cardiac Rehabilitation.  Dr. Vida Rigger is Medical Director for [facility] Pulmonary Rehabilitation.

## 2023-09-02 ENCOUNTER — Encounter: Payer: Medicare Other | Admitting: *Deleted

## 2023-09-02 DIAGNOSIS — Z9861 Coronary angioplasty status: Secondary | ICD-10-CM

## 2023-09-02 DIAGNOSIS — Z48812 Encounter for surgical aftercare following surgery on the circulatory system: Secondary | ICD-10-CM | POA: Diagnosis not present

## 2023-09-02 NOTE — Progress Notes (Signed)
Daily Session Note  Patient Details  Name: Stacie Mcdonald MRN: [id number] Date of Birth: [birth year] Referring Provider:   Flowsheet Row Cardiac Rehab from 07/15/2023 in [facility] Cardiac and Pulmonary Rehab  Referring Provider Dr. Dorothyann Peng, MD       Encounter Date: 09/02/2023  Check In:  Session Check In - 09/02/23 0924       Check-In   Supervising physician immediately available to respond to emergencies See telemetry face sheet for immediately available ER MD    Location [facility]-Cardiac & Pulmonary Rehab    Staff Present Cora Collum, RN, BSN, CCRP;Joseph Hood, RCP,RRT,BSRT;Maxon Auburn BS, , Exercise Physiologist;Porschia Willbanks Katrinka Blazing, RN, [place]    Virtual Visit No    Medication changes reported     No    Fall or balance concerns reported    No    Warm-up and Cool-down Performed on first and last piece of equipment    Resistance Training Performed Yes    VAD Patient? No    PAD/SET Patient? No      Pain Assessment   Currently in Pain? No/denies                Social History   Tobacco Use  Smoking Status Never  Smokeless Tobacco Never    Goals Met:  Independence with exercise equipment Exercise tolerated well No report of concerns or symptoms today Strength training completed today  Goals Unmet:  Not Applicable  Comments: Pt able to follow exercise prescription today without complaint.  Will continue to monitor for progression.    Dr. Bethann Punches is Medical Director for [facility] Cardiac Rehabilitation.  Dr. Vida Rigger is Medical Director for [facility] Pulmonary Rehabilitation.

## 2023-09-03 ENCOUNTER — Encounter: Payer: Medicare Other | Admitting: *Deleted

## 2023-09-03 DIAGNOSIS — Z48812 Encounter for surgical aftercare following surgery on the circulatory system: Secondary | ICD-10-CM | POA: Diagnosis not present

## 2023-09-03 DIAGNOSIS — Z9861 Coronary angioplasty status: Secondary | ICD-10-CM

## 2023-09-03 NOTE — Progress Notes (Signed)
Daily Session Note  Patient Details  Name: Stacie Mcdonald MRN: [id number] Date of Birth: 1946-02-20 Referring Provider:   Flowsheet Row Cardiac Rehab from 07/15/2023 in [facility] Cardiac and Pulmonary Rehab  Referring Provider Dr. Dorothyann Peng, MD       Encounter Date: 09/03/2023  Check In:  Session Check In - 09/03/23 0951       Check-In   Supervising physician immediately available to respond to emergencies See telemetry face sheet for immediately available ER MD    Location [facility]-Cardiac & Pulmonary Rehab    Staff Present Elige Ko, RCP,RRT,BSRT;Noah Tickle, BS, Exercise Physiologist;Daman Steffenhagen, RN, BSN, CCRP    Virtual Visit No    Medication changes reported     No    Fall or balance concerns reported    No    Warm-up and Cool-down Performed on first and last piece of equipment    Resistance Training Performed Yes    VAD Patient? No    PAD/SET Patient? No      Pain Assessment   Currently in Pain? No/denies                Social History   Tobacco Use  Smoking Status Never  Smokeless Tobacco Never    Goals Met:  Independence with exercise equipment Exercise tolerated well No report of concerns or symptoms today  Goals Unmet:  Not Applicable  Comments: Pt able to follow exercise prescription today without complaint.  Will continue to monitor for progression.    Dr. Bethann Punches is Medical Director for [facility] Cardiac Rehabilitation.  Dr. Vida Rigger is Medical Director for [facility] Pulmonary Rehabilitation.

## 2023-09-06 ENCOUNTER — Telehealth: Payer: Self-pay | Admitting: Internal Medicine

## 2023-09-06 DIAGNOSIS — E1169 Type 2 diabetes mellitus with other specified complication: Secondary | ICD-10-CM

## 2023-09-06 NOTE — Telephone Encounter (Signed)
Patient need lab orders.

## 2023-09-08 ENCOUNTER — Other Ambulatory Visit: Payer: Medicare Other

## 2023-09-08 DIAGNOSIS — E1169 Type 2 diabetes mellitus with other specified complication: Secondary | ICD-10-CM | POA: Diagnosis not present

## 2023-09-08 DIAGNOSIS — E785 Hyperlipidemia, unspecified: Secondary | ICD-10-CM

## 2023-09-08 LAB — COMPREHENSIVE METABOLIC PANEL
ALT: 14 U/L (ref 0–35)
AST: 13 U/L (ref 0–37)
Albumin: 4.3 g/dL (ref 3.5–5.2)
Alkaline Phosphatase: 48 U/L (ref 39–117)
BUN: 20 mg/dL (ref 6–23)
CO2: 29 meq/L (ref 19–32)
Calcium: 9.3 mg/dL (ref 8.4–10.5)
Chloride: 104 meq/L (ref 96–112)
Creatinine, Ser: 1.02 mg/dL (ref 0.40–1.20)
GFR: 52.63 mL/min — ABNORMAL LOW (ref >60.00)
Glucose, Bld: 104 mg/dL — ABNORMAL HIGH (ref 70–99)
Potassium: 4.1 meq/L (ref 3.5–5.1)
Sodium: 139 meq/L (ref 135–145)
Total Bilirubin: 0.4 mg/dL (ref 0.2–1.2)
Total Protein: 6.6 g/dL (ref 6.0–8.3)

## 2023-09-08 LAB — LIPID PANEL
Cholesterol: 113 mg/dL (ref 0–200)
HDL: 34.1 mg/dL — ABNORMAL LOW (ref >39.00)
LDL Cholesterol: 41 mg/dL (ref 0–99)
NonHDL: 79.16
Total CHOL/HDL Ratio: 3
Triglycerides: 189 mg/dL — ABNORMAL HIGH (ref 0.0–149.0)
VLDL: 37.8 mg/dL (ref 0.0–40.0)

## 2023-09-08 LAB — LDL CHOLESTEROL, DIRECT: Direct LDL: 61 mg/dL

## 2023-09-09 ENCOUNTER — Encounter: Payer: Medicare Other | Admitting: *Deleted

## 2023-09-15 ENCOUNTER — Encounter: Payer: Self-pay | Admitting: *Deleted

## 2023-09-15 DIAGNOSIS — Z9861 Coronary angioplasty status: Secondary | ICD-10-CM

## 2023-09-15 NOTE — Progress Notes (Signed)
Cardiac Individual Treatment Plan  Patient Details  Name: Kayton T Swaziland MRN: [id number] Date of Birth: 08-10-1946 Referring Provider:   Flowsheet Row Cardiac Rehab from 07/15/2023 in [facility] Cardiac and Pulmonary Rehab  Referring Provider Dr. Dorothyann Peng, MD       Initial Encounter Date:  Flowsheet Row Cardiac Rehab from 07/15/2023 in [facility] Cardiac and Pulmonary Rehab  Date 07/15/23       Visit Diagnosis: S/P PTCA (percutaneous transluminal coronary angioplasty)  Patient's Home Medications on Admission:  Current Outpatient Medications:    albuterol (VENTOLIN HFA) 108 (90 Base) MCG/ACT inhaler, Inhale 2 puffs into the lungs every 6 (six) hours as needed. Using in Am daily  per MD, Disp: , Rfl:    aspirin 81 MG tablet, Take 81 mg by mouth daily., Disp: , Rfl:    clopidogrel (PLAVIX) 75 MG tablet, TAKE ONE TABLET BY MOUTH DAILY (Patient taking differently: Take 75 mg by mouth daily.), Disp: 90 tablet, Rfl: 1   D-MANNOSE PO, Take 800 mg by mouth daily., Disp: , Rfl:    ELDERBERRY PO, Take 1,000 mg by mouth daily., Disp: , Rfl:    empagliflozin (JARDIANCE) 10 MG TABS tablet, Take 1 tablet (10 mg total) by mouth daily before breakfast., Disp: 90 tablet, Rfl: 2   furosemide (LASIX) 40 MG tablet, Take 40 mg by mouth daily., Disp: , Rfl:    GLUCOSAMINE-CHONDROITIN PO, Take 2 tablets by mouth daily., Disp: , Rfl:    isosorbide mononitrate (IMDUR) 120 MG 24 hr tablet, Take 120 mg by mouth daily. , Disp: , Rfl:    loratadine (CLARITIN) 10 MG tablet, Take 10 mg by mouth daily as needed for allergies., Disp: , Rfl:    metoprolol (TOPROL-XL) 200 MG 24 hr tablet, TAKE 1 TABLET BY MOUTH DAILY., Disp: 90 tablet, Rfl: 1   nitroGLYCERIN (NITROSTAT) 0.4 MG SL tablet, Place 0.4 mg under the tongue every 5 (five) minutes as needed for chest pain., Disp: , Rfl:    pantoprazole (PROTONIX) 40 MG tablet, Take 1 tablet (40 mg total) by mouth daily., Disp: 90 tablet, Rfl: 1   PRALUENT 75 MG/ML SOAJ, Inject 75  mg into the muscle every 14 (fourteen) days., Disp: , Rfl:    ranolazine (RANEXA) 500 MG 12 hr tablet, Take 500 mg by mouth 2 (two) times daily., Disp: , Rfl:    sacubitril-valsartan (ENTRESTO) 49-51 MG, Take 1 tablet by mouth 2 (two) times daily., Disp: , Rfl:    spironolactone (ALDACTONE) 25 MG tablet, Take 1 tablet (25 mg total) by mouth daily., Disp: , Rfl:    traZODone (DESYREL) 150 MG tablet, Take 1 tablet (150 mg total) by mouth at bedtime., Disp: 90 tablet, Rfl: 0  Past Medical History: Past Medical History:  Diagnosis Date   Allergy    Arthritis    fingers, knees   Asthma    Breast Cancer 1991   R mastectomy. 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   Sleep apnea    CPAP   Stable angina (HCC) 07/27/2019   Thoracic vertebral fracture (HCC) 04/05/2012   Urinary tract infection    Wears contact lenses    left only    Tobacco Use: Social History   Tobacco Use  Smoking Status Never  Smokeless Tobacco Never    Labs: Review Flowsheet  More data exists      Latest Ref Rng & Units 05/06/2022 11/23/2022 08/09/2023 08/17/2023 09/08/2023  Labs for ITP Cardiac and Pulmonary Rehab  Cholestrol 0 - 200 mg/dL 295  621  - 308  657   LDL (calc) 0 - 99 mg/dL 58  46  - 47  41   Direct LDL mg/dL 84.6  96.2  - 95.2  84.1   HDL-C >39.00 mg/dL 32.44  01.02  - 72.53  34.10   Trlycerides 0.0 - 149.0 mg/dL  664.4  034.7  - 425.9  189.0   Hemoglobin A1c - 6.6  6.3  6.0     - -    Details       This result is from an external source.          Exercise Target Goals: Exercise Program Goal: Individual exercise prescription set using results from initial 6 min walk test and THRR while considering  patient's activity barriers and safety.   Exercise Prescription Goal: Initial exercise prescription builds to 30-45 minutes a day of aerobic activity, 2-3 days per week.  Home exercise guidelines will be given to patient during program as part of exercise prescription that the participant will acknowledge.   Education: Aerobic Exercise: - Group verbal and visual presentation on the components of exercise prescription. Introduces F.I.T.T principle from ACSM for exercise prescriptions.  Reviews F.I.T.T. principles of aerobic exercise including progression. Written material given at graduation. Flowsheet Row Cardiac Rehab from 08/18/2018 in [facility] Cardiac and Pulmonary Rehab  Date 08/18/18  Educator [facility]  Instruction Review Code 5- Refused Teaching       Education: Resistance Exercise: - Group verbal and visual presentation on the components of exercise prescription. Introduces F.I.T.T principle from ACSM for exercise prescriptions  Reviews F.I.T.T. principles of resistance exercise including progression. Written material given at graduation.    Education: Exercise & Equipment Safety: - Individual verbal instruction and demonstration of equipment use and safety with use of the equipment. Flowsheet Row Cardiac Rehab from 09/02/2023 in [facility] Cardiac and Pulmonary Rehab  Date 07/15/23  Educator NT  Instruction Review Code 1- Verbalizes Understanding       Education: Exercise Physiology & General Exercise Guidelines: - Group verbal and written instruction with models to review the exercise physiology of the cardiovascular system and associated critical values. Provides general exercise guidelines  with specific guidelines to those with heart or lung disease.  Flowsheet Row Cardiac Rehab from 09/02/2023 in [facility] Cardiac and Pulmonary Rehab  Date 09/02/23  Educator MB  Instruction Review Code 1- [employer] Understanding       Education: Flexibility, Balance, Mind/Body Relaxation: - Group verbal and visual presentation with interactive activity on the components of exercise prescription. Introduces F.I.T.T principle from ACSM for exercise prescriptions. Reviews F.I.T.T. principles of flexibility and balance exercise training including progression. Also discusses the mind body connection.  Reviews various relaxation techniques to help reduce and manage stress (i.e. Deep breathing, progressive muscle relaxation, and visualization).  Balance handout provided to take home. Written material given at graduation. Flowsheet Row Cardiac Rehab from 08/18/2018 in [facility] Cardiac and Pulmonary Rehab  Date 06/28/18  Educator AS  Instruction Review Code 5- Refused Teaching       Activity Barriers & Risk Stratification:  Activity Barriers & Cardiac Risk Stratification - 07/15/23 1439       Activity Barriers & Cardiac Risk Stratification   Activity Barriers Shortness of Breath;Chest Pain/Angina;Deconditioning;Muscular Weakness    Cardiac Risk Stratification High             6 Minute Walk:  6 Minute Walk     Row Name 07/15/23 1438         6 Minute Walk   Phase Initial     Distance 1055 feet     Walk Time 6 minutes     # of Rest Breaks 0     MPH 1.99     METS 1.92     RPE 15     Perceived Dyspnea  2     VO2 Peak 6.72     Symptoms Yes (comment)     Comments SOB, leg weakness     Resting HR 80 bpm     Resting BP 124/68     Resting Oxygen Saturation  93 %     Exercise Oxygen Saturation  during 6 min walk 93 %     Max Ex. HR 108 bpm     Max Ex. BP 142/74     2 Minute Post BP 120/68              Oxygen Initial Assessment:   Oxygen Re-Evaluation:   Oxygen Discharge  (Final Oxygen Re-Evaluation):   Initial Exercise Prescription:  Initial Exercise Prescription - 07/15/23 1400       Date of Initial Exercise RX and Referring Provider   Date 07/15/23    Referring Provider Dr. Dorothyann Peng, MD      Oxygen   Maintain Oxygen Saturation 88% or higher      Treadmill   MPH 1.4    Grade 0    Minutes 15    METs 2.07      NuStep   Level 2    SPM 80    Minutes 15    METs 1.92      Biostep-RELP   Level 1    SPM 50    Minutes 15    METs 1.92      Prescription Details   Frequency (times per week) 3    Duration Progress to 30 minutes of continuous aerobic without signs/symptoms of physical distress      Intensity   THRR 40-80% of Max Heartrate 104-129    Ratings of Perceived Exertion 11-13    Perceived Dyspnea 0-4      Progression   Progression Continue to progress workloads to maintain intensity without signs/symptoms of physical distress.      Resistance Training   Training Prescription Yes    Weight 3 lb    Reps 10-15             Perform Capillary Blood Glucose checks as needed.  Exercise Prescription Changes:   Exercise Prescription Changes     Row Name 07/15/23 1400 07/29/23 1300 08/10/23 1400 08/25/23 1100 09/09/23 0700     Response to Exercise   Blood Pressure (Admit) 124/68 102/58 124/62 112/62 102/60   Blood Pressure (Exercise) 142/74 134/64 126/66 132/66 142/70   Blood Pressure (Exit) 120/68 106/60  130/70 100/56 102/58   Heart Rate (Admit) 80 bpm 93 bpm 84 bpm 89 bpm 96 bpm   Heart Rate (Exercise) 108 bpm 107 bpm 112 bpm 115 bpm 102 bpm   Heart Rate (Exit) 87 bpm 89 bpm 84 bpm 88 bpm 78 bpm   Oxygen Saturation (Admit) 93 % -- -- -- --   Oxygen Saturation (Exercise) 93 % -- -- -- --   Rating of Perceived Exertion (Exercise) 15 14 15 15 15    Perceived Dyspnea (Exercise) 2 -- -- 0 --   Symptoms SOB, leg weakness none none none none   Comments Results First two days of exercise -- -- --   Duration --  Progress to 30 minutes of  aerobic without signs/symptoms of physical distress Progress to 30 minutes of  aerobic without signs/symptoms of physical distress Progress to 30 minutes of  aerobic without signs/symptoms of physical distress Progress to 30 minutes of  aerobic without signs/symptoms of physical distress   Intensity -- THRR unchanged THRR unchanged THRR unchanged THRR unchanged     Progression   Progression -- Continue to progress workloads to maintain intensity without signs/symptoms of physical distress. Continue to progress workloads to maintain intensity without signs/symptoms of physical distress. Continue to progress workloads to maintain intensity without signs/symptoms of physical distress. Continue to progress workloads to maintain intensity without signs/symptoms of physical distress.   Average METs -- 2.04 2.04 2.17 2.27     Resistance Training   Training Prescription -- Yes Yes Yes Yes   Weight -- 3 lb 3 lb 3 lb 3 lb   Reps -- 10-15 10-15 10-15 10-15     Interval Training   Interval Training -- No No No No     Treadmill   MPH -- 1.4 1.4 1.5 1.6   Grade -- 0 0 0 0   Minutes -- 15 15 15 15    METs -- 2.07 2.07 2.15 2.23     Recumbant Bike   Level -- -- -- 1 1   Watts -- -- -- 16 16   Minutes -- -- -- 15 15   METs -- -- -- 2.64 2.63     NuStep   Level -- 3 4 4 4    Minutes -- 15 15 15 15    METs -- 1.8 2 2.1 2.5     Biostep-RELP   Level -- -- 1 -- --   Minutes -- -- 15 -- --   METs -- -- 2 -- --     Oxygen   Maintain Oxygen Saturation -- 88% or higher 88% or higher 88% or higher 88% or higher            Exercise Comments:   Exercise Comments     Row Name 07/15/23 1419           Exercise Comments First full day of exercise!  Patient was oriented to gym and equipment including functions, settings, policies, and procedures.  Patient's individual exercise prescription and treatment plan were reviewed.  All starting workloads were established based on  the results of the 6 minute walk test done at initial orientation visit.  The plan for exercise progression was also introduced and progression will be customized based on patient's performance and goals.                Exercise Goals and Review:   Exercise Goals     Row Name 07/15/23 1440  Exercise Goals   Increase Physical Activity Yes       Intervention Provide advice, education, support and counseling about physical activity/exercise needs.;Develop an individualized exercise prescription for aerobic and resistive training based on initial evaluation findings, risk stratification, comorbidities and participant's personal goals.       Expected Outcomes Short Term: Attend rehab on a regular basis to increase amount of physical activity.;Long Term: Add in home exercise to make exercise part of routine and to increase amount of physical activity.;Long Term: Exercising regularly at least 3-5 days a week.       Increase Strength and Stamina Yes       Intervention Provide advice, education, support and counseling about physical activity/exercise needs.;Develop an individualized exercise prescription for aerobic and resistive training based on initial evaluation findings, risk stratification, comorbidities and participant's personal goals.       Expected Outcomes Short Term: Increase workloads from initial exercise prescription for resistance, speed, and METs.;Short Term: Perform resistance training exercises routinely during rehab and add in resistance training at home;Long Term: Improve cardiorespiratory fitness, muscular endurance and strength as measured by increased METs and functional capacity ( )       Able to understand and use rate of perceived exertion (RPE) scale Yes       Intervention Provide education and explanation on how to use RPE scale       Expected Outcomes Short Term: Able to use RPE daily in rehab to express subjective intensity level;Long Term:  Able to use  RPE to guide intensity level when exercising independently       Able to understand and use Dyspnea scale Yes       Intervention Provide education and explanation on how to use Dyspnea scale       Expected Outcomes Short Term: Able to use Dyspnea scale daily in rehab to express subjective sense of shortness of breath during exertion;Long Term: Able to use Dyspnea scale to guide intensity level when exercising independently       Knowledge and understanding of Target Heart Rate Range (THRR) Yes       Intervention Provide education and explanation of THRR including how the numbers were predicted and where they are located for reference       Expected Outcomes Short Term: Able to state/look up THRR;Short Term: Able to use daily as guideline for intensity in rehab;Long Term: Able to use THRR to govern intensity when exercising independently       Able to check pulse independently Yes       Intervention Provide education and demonstration on how to check pulse in carotid and radial arteries.;Review the importance of being able to check your own pulse for safety during independent exercise       Expected Outcomes Short Term: Able to explain why pulse checking is important during independent exercise;Long Term: Able to check pulse independently and accurately       Understanding of Exercise Prescription Yes       Intervention Provide education, explanation, and written materials on patient's individual exercise prescription       Expected Outcomes Short Term: Able to explain program exercise prescription;Long Term: Able to explain home exercise prescription to exercise independently                Exercise Goals Re-Evaluation :  Exercise Goals Re-Evaluation     Row Name 07/21/23 1420 07/29/23 1333 08/10/23 1414 08/25/23 1104 09/09/23 0743     Exercise Goal Re-Evaluation   Exercise  Goals Review Increase Physical Activity;Able to understand and use rate of perceived exertion (RPE)  scale;Understanding of Exercise Prescription;Increase Strength and Stamina;Able to check pulse independently Increase Physical Activity;Increase Strength and Stamina;Understanding of Exercise Prescription Increase Physical Activity;Increase Strength and Stamina;Understanding of Exercise Prescription Increase Physical Activity;Increase Strength and Stamina;Understanding of Exercise Prescription Increase Physical Activity;Increase Strength and Stamina;Understanding of Exercise Prescription   Comments Reviewed RPE  and dyspnea scale, THR and program prescription with pt today.  Pt voiced understanding and was given a copy of goals to take home. Cinzia is off to a good start in the program. She was able to walk the treadmill at a speed of 1.4 mph with no incline during her first two sessions. She also improved from level 2 to level 3 on the T4 nustep. We will continue to monitor her progress in the program. Jasma is doing well in the program. She has continued to walk on the treadmill at a speed of 1.4 mph with no incline. She also improved to level 4 on the T4 nustep and began using the biostep at level 1. We will continue to monitor her progress in the program. Olicia is doing well in the program. She has recently added the recumbent bike to her exercise routine at level 1 intensity. She also increased her treadmill speed from 1.4 mph to 1.5 mph. We will continue to monitor her progress in the program. Margareth continues to do well in rehab. She recently increased her speed on the treadmill to 1.6 mph with no incline. She also has continued to work at level 4 on the T4 nustep and level 1 on the recumbent bike. She has done well with 3 lb hand weights for resistance training as well. We will continue to monitor her progress in the program.   Expected Outcomes Short: Use RPE daily to regulate intensity.  Long: Follow program prescription in THR. Short: Continue to follow current exercise prescription. Long: Continue  exercise to improve strength and stamina. Short: Begin to progressively increase treadmill workload. Long: Continue exercise to improve strength and stamina. Short: Begin to progressively increase treadmill workload. Long: Continue exercise to improve strength and stamina. Short: Try level 2 on the recumbent bike. Long: Continue to increase overall METs and stamina.            Discharge Exercise Prescription (Final Exercise Prescription Changes):  Exercise Prescription Changes - 09/09/23 0700       Response to Exercise   Blood Pressure (Admit) 102/60    Blood Pressure (Exercise) 142/70    Blood Pressure (Exit) 102/58    Heart Rate (Admit) 96 bpm    Heart Rate (Exercise) 102 bpm    Heart Rate (Exit) 78 bpm    Rating of Perceived Exertion (Exercise) 15    Symptoms none    Duration Progress to 30 minutes of  aerobic without signs/symptoms of physical distress    Intensity THRR unchanged      Progression   Progression Continue to progress workloads to maintain intensity without signs/symptoms of physical distress.    Average METs 2.27      Resistance Training   Training Prescription Yes    Weight 3 lb    Reps 10-15      Interval Training   Interval Training No      Treadmill   MPH 1.6    Grade 0    Minutes 15    METs 2.23      Recumbant Bike   Level 1  Watts 16    Minutes 15    METs 2.63      NuStep   Level 4    Minutes 15    METs 2.5      Oxygen   Maintain Oxygen Saturation 88% or higher             Nutrition:  Target Goals: Understanding of nutrition guidelines, daily intake of sodium 1500mg , cholesterol 200mg , calories 30% from fat and 7% or less from saturated fats, daily to have 5 or more servings of fruits and vegetables.  Education: All About Nutrition: -Group instruction provided by verbal, written material, interactive activities, discussions, models, and posters to present general guidelines for heart healthy nutrition including fat, fiber,  MyPlate, the role of sodium in heart healthy nutrition, utilization of the nutrition label, and utilization of this knowledge for meal planning. Follow up email sent as well. Written material given at graduation. Flowsheet Row Cardiac Rehab from 09/02/2023 in [facility] Cardiac and Pulmonary Rehab  Date 07/21/23  Educator JG  Instruction Review Code 1- Verbalizes Understanding       Biometrics:  Pre Biometrics - 07/15/23 1440       Pre Biometrics   Height 5' 3.5" (1.613 m)    Weight 181 lb 1.6 oz (82.1 kg)    Waist Circumference 40 inches    Hip Circumference 42.5 inches    Waist to Hip Ratio 0.94 %    BMI (Calculated) 31.57    Single Leg Stand 8.3 seconds              Nutrition Therapy Plan and Nutrition Goals:   Nutrition Assessments:  MEDIFICTS Score Key: >=70 Need to make dietary changes  40-70 Heart Healthy Diet <= 40 Therapeutic Level Cholesterol Diet  Flowsheet Row Cardiac Rehab from 07/21/2023 in [facility] Cardiac and Pulmonary Rehab  Picture Your Plate Total Score on Admission 54      Picture Your Plate Scores: <40 Unhealthy dietary pattern with much room for improvement. 41-50 Dietary pattern unlikely to meet recommendations for good health and room for improvement. 51-60 More healthful dietary pattern, with some room for improvement.  >60 Healthy dietary pattern, although there may be some specific behaviors that could be improved.    Nutrition Goals Re-Evaluation:   Nutrition Goals Discharge (Final Nutrition Goals Re-Evaluation):   Psychosocial: Target Goals: Acknowledge presence or absence of significant depression and/or stress, maximize coping skills, provide positive support system. Participant is able to verbalize types and ability to use techniques and skills needed for reducing stress and depression.   Education: Stress, Anxiety, and Depression - Group verbal and visual presentation to define topics covered.  Reviews how body is impacted by stress,  anxiety, and depression.  Also discusses healthy ways to reduce stress and to treat/manage anxiety and depression.  Written material given at graduation. Flowsheet Row Cardiac Rehab from 09/02/2023 in [facility] Cardiac and Pulmonary Rehab  Date 08/26/23  Educator SB  Instruction Review Code 1- [employer] Understanding       Education: Sleep Hygiene -Provides group verbal and written instruction about how sleep can affect your health.  Define sleep hygiene, discuss sleep cycles and impact of sleep habits. Review good sleep hygiene tips.  Flowsheet Row Cardiac Rehab from 08/18/2018 in [facility] Cardiac and Pulmonary Rehab  Date 07/19/18  Educator [facility]  Instruction Review Code 1- Verbalizes Understanding       Initial Review & Psychosocial Screening:  Initial Psych Review & Screening - 07/13/23 1317  Initial Review   Current issues with None Identified      Family Dynamics   Good Support System? Yes   husband, son and daughter in law, other daughter     Barriers   Psychosocial barriers to participate in program There are no identifiable barriers or psychosocial needs.      Screening Interventions   Interventions To provide support and resources with identified psychosocial needs;Provide feedback about the scores to participant;Encouraged to exercise    Expected Outcomes Short Term goal: Utilizing psychosocial counselor, staff and physician to assist with identification of specific Stressors or current issues interfering with healing process. Setting desired goal for each stressor or current issue identified.;Long Term Goal: Stressors or current issues are controlled or eliminated.;Short Term goal: Identification and review with participant of any Quality of Life or Depression concerns found by scoring the questionnaire.;Long Term goal: The participant improves quality of Life and PHQ9 Scores as seen by post scores and/or verbalization of changes             Quality of Life Scores:    Quality of Life - 07/21/23 1443       Quality of Life   Select Quality of Life      Quality of Life Scores   Health/Function Pre 19 %    Socioeconomic Pre 25.57 %    Psych/Spiritual Pre 24 %    Family Pre 24 %    GLOBAL Pre 22.21 %            Scores of 19 and below usually indicate a poorer quality of life in these areas.  A difference of  2-3 points is a clinically meaningful difference.  A difference of 2-3 points in the total score of the Quality of Life Index has been associated with significant improvement in overall quality of life, self-image, physical symptoms, and general health in studies assessing change in quality of life.  PHQ-9: Review Flowsheet  More data exists      08/17/2023 07/15/2023 05/17/2023 01/29/2023 11/23/2022  Depression screen PHQ 2/9  Decreased Interest 0 0 0 0 0  Down, Depressed, Hopeless 0 0 0 0 0  PHQ - 2 Score 0 0 0 0 0  Altered sleeping - 0 1 0 -  Tired, decreased energy - 2 1 0 -  Change in appetite - 0 0 0 -  Feeling bad or failure about yourself  - 0 0 0 -  Trouble concentrating - 0 0 0 -  Moving slowly or fidgety/restless - 0 0 0 -  Suicidal thoughts - 0 0 0 -  PHQ-9 Score - 2 2 0 -  Difficult doing work/chores - Not difficult at all Not difficult at all Not difficult at all -    Details           Interpretation of Total Score  Total Score Depression Severity:  1-4 = Minimal depression, 5-9 = Mild depression, 10-14 = Moderate depression, 15-19 = Moderately severe depression, 20-27 = Severe depression   Psychosocial Evaluation and Intervention:  Psychosocial Evaluation - 07/13/23 1447       Psychosocial Evaluation & Interventions   Comments Presious has no barriers to attending the program. She lives with her husband. He and her chidren and daughter in law are her support.  She is ready to start the program and work on her exercise progression to get back to her 3 x a week  group exercise class.    Expected Outcomes STG Attends all  scheduled sessions and is able to work on her exercise progression to return to level prior to the PTCA. LTG Continue to progress with her exericse without symptoms    Continue Psychosocial Services  Follow up required by staff             Psychosocial Re-Evaluation:   Psychosocial Discharge (Final Psychosocial Re-Evaluation):   Vocational Rehabilitation: Provide vocational rehab assistance to qualifying candidates.   Vocational Rehab Evaluation & Intervention:  Vocational Rehab - 07/13/23 1319       Initial Vocational Rehab Evaluation & Intervention   Assessment shows need for Vocational Rehabilitation No      Vocational Rehab Re-Evaulation   Comments retired             Education: Education Goals: Education classes will be provided on a variety of topics geared toward better understanding of heart health and risk factor modification. Participant will state understanding/return demonstration of topics presented as noted by education test scores.  Learning Barriers/Preferences:  Learning Barriers/Preferences - 07/13/23 1319       Learning Barriers/Preferences   Learning Barriers None    Learning Preferences None             General Cardiac Education Topics:  AED/CPR: - Group verbal and written instruction with the use of models to demonstrate the basic use of the AED with the basic ABC's of resuscitation. Flowsheet Row Cardiac Rehab from 08/18/2018 in [facility] Cardiac and Pulmonary Rehab  Date 06/02/18  Educator SB  Instruction Review Code 1- Verbalizes Understanding       Anatomy and Cardiac Procedures: - Group verbal and visual presentation and models provide information about basic cardiac anatomy and function. Reviews the testing methods done to diagnose heart disease and the outcomes of the test results. Describes the treatment choices: Medical Management, Angioplasty, or Coronary Bypass Surgery for treating various heart conditions including Myocardial  Infarction, Angina, Valve Disease, and Cardiac Arrhythmias.  Written material given at graduation. Flowsheet Row Cardiac Rehab from 08/18/2018 in [facility] Cardiac and Pulmonary Rehab  Date 05/26/18  Educator SB  Instruction Review Code 5- Refused Teaching       Medication Safety: - Group verbal and visual instruction to review commonly prescribed medications for heart and lung disease. Reviews the medication, class of the drug, and side effects. Includes the steps to properly store meds and maintain the prescription regimen.  Written material given at graduation. Flowsheet Row Cardiac Rehab from 08/18/2018 in [facility] Cardiac and Pulmonary Rehab  Date 07/12/18  Educator SB  Instruction Review Code 5- Refused Teaching       Intimacy: - Group verbal instruction through game format to discuss how heart and lung disease can affect sexual intimacy. Written material given at graduation.. Flowsheet Row Cardiac Rehab from 08/18/2018 in [facility] Cardiac and Pulmonary Rehab  Date 08/09/18  Educator SB  Instruction Review Code 5- Refused Teaching       Know Your Numbers and Heart Failure: - Group verbal and visual instruction to discuss disease risk factors for cardiac and pulmonary disease and treatment options.  Reviews associated critical values for Overweight/Obesity, Hypertension, Cholesterol, and Diabetes.  Discusses basics of heart failure: signs/symptoms and treatments.  Introduces Heart Failure Zone chart for action plan for heart failure.  Written material given at graduation. Flowsheet Row Cardiac Rehab from 09/02/2023 in [facility] Cardiac and Pulmonary Rehab  Date 08/18/23  Educator SB  Instruction Review Code 1- Verbalizes Understanding       Infection Prevention: - Provides verbal  and written material to individual with discussion of infection control including proper hand washing and proper equipment cleaning during exercise session. Flowsheet Row Cardiac Rehab from 09/02/2023 in [facility]  Cardiac and Pulmonary Rehab  Date 07/15/23  Educator NT  Instruction Review Code 1- Verbalizes Understanding       Falls Prevention: - Provides verbal and written material to individual with discussion of falls prevention and safety. Flowsheet Row Cardiac Rehab from 09/02/2023 in [facility] Cardiac and Pulmonary Rehab  Date 07/13/23  Educator SB  Instruction Review Code 1- Verbalizes Understanding       Other: -Provides group and verbal instruction on various topics (see comments)   Knowledge Questionnaire Score:  Knowledge Questionnaire Score - 07/21/23 1441       Knowledge Questionnaire Score   Pre Score 26/26             Core Components/Risk Factors/Patient Goals at Admission:  Personal Goals and Risk Factors at Admission - 07/13/23 1320       Core Components/Risk Factors/Patient Goals on Admission    Weight Management Yes;Weight Loss    Intervention Weight Management: Develop a combined nutrition and exercise program designed to reach desired caloric intake, while maintaining appropriate intake of nutrient and fiber, sodium and fats, and appropriate energy expenditure required for the weight goal.;Weight Management: Provide education and appropriate resources to help participant work on and attain dietary goals.    Admit Weight 179 lb (81.2 kg)    Goal Weight: Short Term 178 lb (80.7 kg)    Goal Weight: Long Term 169 lb (76.7 kg)    Expected Outcomes Short Term: Continue to assess and modify interventions until short term weight is achieved;Long Term: Adherence to nutrition and physical activity/exercise program aimed toward attainment of established weight goal    Improve shortness of breath with ADL's Yes    Intervention Provide education, individualized exercise plan and daily activity instruction to help decrease symptoms of SOB with activities of daily living.    Expected Outcomes Short Term: Improve cardiorespiratory fitness to achieve a reduction of symptoms when  performing ADLs;Long Term: Be able to perform more ADLs without symptoms or delay the onset of symptoms    Diabetes Yes   A1C every 6 months   diet controlled   Intervention Provide education about signs/symptoms and action to take for hypo/hyperglycemia.;Provide education about proper nutrition, including hydration, and aerobic/resistive exercise prescription along with prescribed medications to achieve blood glucose in normal ranges: Fasting glucose 65-99 mg/dL    Expected Outcomes Short Term: Participant verbalizes understanding of the signs/symptoms and immediate care of hyper/hypoglycemia, proper foot care and importance of medication, aerobic/resistive exercise and nutrition plan for blood glucose control.;Long Term: Attainment of HbA1C < 7%.    Heart Failure Yes    Intervention Provide a combined exercise and nutrition program that is supplemented with education, support and counseling about heart failure. Directed toward relieving symptoms such as shortness of breath, decreased exercise tolerance, and extremity edema.    Expected Outcomes Improve functional capacity of life;Short term: Attendance in program 2-3 days a week with increased exercise capacity. Reported lower sodium intake. Reported increased fruit and vegetable intake. Reports medication compliance.;Short term: Daily weights obtained and reported for increase. Utilizing diuretic protocols set by physician.;Long term: Adoption of self-care skills and reduction of barriers for early signs and symptoms recognition and intervention leading to self-care maintenance.    Hypertension Yes    Intervention Provide education on lifestyle modifcations including regular physical activity/exercise, weight management,  moderate sodium restriction and increased consumption of fresh fruit, vegetables, and low fat dairy, alcohol moderation, and smoking cessation.;Monitor prescription use compliance.    Expected Outcomes Short Term: Continued assessment  and intervention until BP is < 140/5mm HG in hypertensive participants. < 130/73mm HG in hypertensive participants with diabetes, heart failure or chronic kidney disease.;Long Term: Maintenance of blood pressure at goal levels.    Lipids Yes    Intervention Provide education and support for participant on nutrition & aerobic/resistive exercise along with prescribed medications to achieve LDL 70mg , HDL >40mg .    Expected Outcomes Short Term: Participant states understanding of desired cholesterol values and is compliant with medications prescribed. Participant is following exercise prescription and nutrition guidelines.;Long Term: Cholesterol controlled with medications as prescribed, with individualized exercise RX and with personalized nutrition plan. Value goals: LDL < 70mg , HDL > 40 mg.             Education:Diabetes - Individual verbal and written instruction to review signs/symptoms of diabetes, desired ranges of glucose level fasting, after meals and with exercise. Acknowledge that pre and post exercise glucose checks will be done for 3 sessions at entry of program.   Core Components/Risk Factors/Patient Goals Review:    Core Components/Risk Factors/Patient Goals at Discharge (Final Review):    ITP Comments:  ITP Comments     Row Name 07/13/23 1453 07/15/23 1426 07/21/23 1041 08/09/23 1629 08/18/23 1217   ITP Comments Virtual orientation call completed today. shehas an appointment on Date: 07/15/23  for EP eval and gym Orientation.  Documentation of diagnosis can be found in [facility] 06/25/[facility]23 . Completed and gym orientation. Initial ITP created and sent for review to Dr. Bethann Punches, Medical Director. 30 Day review completed. Medical Director ITP review done, changes made as directed, and signed approval by Medical Director.     new to program Essance called rehab today to let us know she went to ER at [facility] with angina and will be transported to [facility] for angioplasty. 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 Will place on medical hold until cardiac clearance received to resume rehab. 30 Day review completed. Medical Director ITP review done, changes made as directed, and signed approval by Medical Director.    Row Name 09/15/23 1307           ITP Comments 30 Day review completed. Medical Director ITP review done, changes made as directed, and signed approval by Medical Director.                Comments:

## 2023-09-16 ENCOUNTER — Encounter: Payer: Medicare Other | Admitting: *Deleted

## 2023-09-16 DIAGNOSIS — Z9861 Coronary angioplasty status: Secondary | ICD-10-CM | POA: Diagnosis not present

## 2023-09-16 DIAGNOSIS — Z48812 Encounter for surgical aftercare following surgery on the circulatory system: Secondary | ICD-10-CM | POA: Diagnosis not present

## 2023-09-16 NOTE — Progress Notes (Signed)
Daily Session Note  Patient Details  Name: Stacie Mcdonald MRN: [id number] Date of Birth: 09-24-1946 Referring Provider:   Flowsheet Row Cardiac Rehab from 07/15/2023 in [facility] Cardiac and Pulmonary Rehab  Referring Provider Dr. Dorothyann Peng, MD       Encounter Date: 09/16/2023  Check In:  Session Check In - 09/16/23 0923       Check-In   Supervising physician immediately available to respond to emergencies See telemetry face sheet for immediately available ER MD    Location [facility]-Cardiac & Pulmonary Rehab    Staff Present Cora Collum, RN, BSN, CCRP;Joseph Hood, RCP,RRT,BSRT;Maxon [place] BS, , Exercise Physiologist;Loralie Malta Katrinka Blazing, RN, [place]    Virtual Visit No    Medication changes reported     No    Fall or balance concerns reported    No    Warm-up and Cool-down Performed on first and last piece of equipment    Resistance Training Performed Yes    VAD Patient? No    PAD/SET Patient? No      Pain Assessment   Currently in Pain? No/denies                Social History   Tobacco Use  Smoking Status Never  Smokeless Tobacco Never    Goals Met:  Independence with exercise equipment Exercise tolerated well No report of concerns or symptoms today Strength training completed today  Goals Unmet:  Not Applicable  Comments: Pt able to follow exercise prescription today without complaint.  Will continue to monitor for progression.    Dr. Bethann Punches is Medical Director for [facility] Cardiac Rehabilitation.  Dr. Vida Rigger is Medical Director for [facility] Pulmonary Rehabilitation.

## 2023-09-17 ENCOUNTER — Encounter: Payer: Medicare Other | Attending: Internal Medicine | Admitting: *Deleted

## 2023-09-17 DIAGNOSIS — Z9861 Coronary angioplasty status: Secondary | ICD-10-CM | POA: Diagnosis not present

## 2023-09-17 NOTE — Progress Notes (Signed)
Daily Session Note  Patient Details  Name: Stacie Mcdonald MRN: [id number] Date of Birth: [birth year] Referring Provider:   Flowsheet Row Cardiac Rehab from 07/15/2023 in [facility] Cardiac and Pulmonary Rehab  Referring Provider Dr. Dorothyann Peng, MD       Encounter Date: 09/17/2023  Check In:  Session Check In - 09/17/23 0857       Check-In   Supervising physician immediately available to respond to emergencies See telemetry face sheet for immediately available ER MD    Location [facility]-Cardiac & Pulmonary Rehab    Staff Present Ronette Deter, BS, Exercise Physiologist;Joseph Shelbie Proctor, RN, [place]    Virtual Visit No    Medication changes reported     No    Fall or balance concerns reported    No    Warm-up and Cool-down Performed on first and last piece of equipment    Resistance Training Performed Yes    VAD Patient? No    PAD/SET Patient? No      Pain Assessment   Currently in Pain? No/denies                Social History   Tobacco Use  Smoking Status Never  Smokeless Tobacco Never    Goals Met:  Independence with exercise equipment Exercise tolerated well No report of concerns or symptoms today Strength training completed today  Goals Unmet:  Not Applicable  Comments: Pt able to follow exercise prescription today without complaint.  Will continue to monitor for progression.    Dr. Bethann Punches is Medical Director for [facility] Cardiac Rehabilitation.  Dr. Vida Rigger is Medical Director for [facility] Pulmonary Rehabilitation.

## 2023-09-18 ENCOUNTER — Ambulatory Visit
Admission: EM | Admit: 2023-09-18 | Discharge: 2023-09-18 | Disposition: A | Discharge status: Home / Self Care | Payer: Medicare Other | Attending: Emergency Medicine | Admitting: Emergency Medicine

## 2023-09-18 DIAGNOSIS — N3000 Acute cystitis without hematuria: Secondary | ICD-10-CM | POA: Diagnosis not present

## 2023-09-18 LAB — POCT URINALYSIS DIP (MANUAL ENTRY)
Bilirubin, UA: NEGATIVE
Blood, UA: NEGATIVE
Glucose, UA: 100 mg/dL — AB
Ketones, POC UA: NEGATIVE mg/dL
Nitrite, UA: POSITIVE — AB
Protein Ur, POC: NEGATIVE mg/dL
Spec Grav, UA: 1.01 (ref 1.010–1.025)
Urobilinogen, UA: 0.2 U/dL
pH, UA: 5 (ref 5.0–8.0)

## 2023-09-18 MED ORDER — CEPHALEXIN 500 MG PO CAPS: 500.0000 mg | ORAL_CAPSULE | Freq: Two times a day (BID) | ORAL | 0 refills | Status: AC

## 2023-09-18 NOTE — ED Provider Notes (Signed)
Renaldo Fiddler    CSN: [id number] Arrival date & time: 09/18/23  0950      History   Chief Complaint Chief Complaint  Patient presents with   Urinary Frequency    HPI Stacie Mcdonald is a 77 y.o. female.  Patient presents with 1 week history of dysuria, urinary frequency, urgency, bladder pressure.  Treatment attempted with cranberry and Azo.  No fever, abdominal pain, flank pain, or other symptoms.  Patient was hospitalized on 08/07/2023 at [facility] for NSTEMI.  The history is provided by the patient and medical records.    Past Medical History:  Diagnosis Date   Allergy    Arthritis    fingers, knees   Asthma    Breast Cancer 1991   R mastectomy. 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   Sleep apnea    CPAP   Stable angina (HCC) 07/27/2019   Thoracic vertebral fracture (HCC) 04/05/2012   Urinary tract infection    Wears contact lenses    left only    Patient Active Problem List   Diagnosis Date Noted   Hospital discharge follow-up 08/17/2023   NSTEMI (non-ST elevated myocardial infarction) (HCC) 08/09/2023   Unstable angina (HCC) 08/07/2023   Status post coronary angioplasty 06/25/2023   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   Myalgia due to statin 03/21/2020   Chronic left-sided low back pain without sciatica 12/08/2019   History of colonic polyps     Polyp of descending colon    Tachycardia 04/08/2018   S/P drug eluting coronary stent placement 04/07/2018   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   Hyperlipidemia associated with type 2 diabetes mellitus (HCC) 01/22/2018   Multiple pulmonary nodules determined by computed tomography of lung 01/09/2018   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   Insomnia due to stress 09/12/2016   Allergic reaction to penicillin 09/18/2015   Medicare annual wellness visit, subsequent 12/05/2012   Essential hypertension 04/14/2012   Chronic neck pain 04/14/2012   Hyperlipidemia with target low density lipoprotein (LDL) cholesterol less than 70 mg/dL 30/86/5784   Personal history of breast cancer    CAD (coronary artery disease)    Hx of adenomatous colonic polyps    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     Past Surgical History:  Procedure Laterality Date   BREAST BIOPSY     right breast reconstruction S/P mastectomy   BREAST SURGERY     right mastectomy   CATARACT EXTRACTION W/PHACO Left 05/01/2020   Procedure: CATARACT EXTRACTION PHACO AND INTRAOCULAR LENS PLACEMENT (IOC) LEFT 5.45  00:48.6  11.2%;  Surgeon: Lockie Mola, MD;  Location: [facility];  Service: Ophthalmology;  Laterality: Left;  sleep apnea   COLONOSCOPY WITH PROPOFOL N/A 08/18/2019   Procedure: COLONOSCOPY WITH PROPOFOL;  Surgeon: Servando Snare,  Darren, MD;  Location: [facility];  Service: Endoscopy;  Laterality: N/A;  sleep apnea   CORONARY ANGIOPLASTY WITH STENT PLACEMENT  Jan 2013   mid RCA 75% occlusion resolved to 0%, [place]   CORONARY STENT INTERVENTION N/A 04/07/2018   Procedure: CORONARY STENT INTERVENTION;  Surgeon: Alwyn Pea, MD;  Location: [facility];  Service: Cardiovascular;  Laterality: N/A;   CORONARY STENT INTERVENTION N/A 02/20/2021   Procedure: CORONARY STENT INTERVENTION;  Surgeon: Alwyn Pea, MD;  Location: [facility];  Service: Cardiovascular;  Laterality:  N/A;   CORONARY STENT INTERVENTION N/A 06/25/2023   Procedure: CORONARY STENT INTERVENTION;  Surgeon: Alwyn Pea, MD;  Location: [facility];  Service: Cardiovascular;  Laterality: N/A;   LEFT HEART CATH AND CORONARY ANGIOGRAPHY N/A 02/20/2021   Procedure: LEFT HEART CATH AND CORONARY ANGIOGRAPHY;  Surgeon: Alwyn Pea, MD;  Location: [facility];  Service: Cardiovascular;  Laterality: N/A;   LEFT HEART CATH AND CORONARY ANGIOGRAPHY Left 06/25/2023   Procedure: LEFT HEART CATH AND CORONARY ANGIOGRAPHY;  Surgeon: Alwyn Pea, MD;  Location: [facility];  Service: Cardiovascular;  Laterality: Left;   MASTECTOMY Right 1991   BREAST CA   POLYPECTOMY  08/18/2019   Procedure: POLYPECTOMY;  Surgeon: Midge Minium, MD;  Location: [facility];  Service: Endoscopy;;   PSEUDOANERYSM COMPRESSION N/A 02/27/2021   Procedure: Chiquita Loth COMPRESSION;  Surgeon: Renford Dills, MD;  Location: [facility];  Service: Cardiovascular;  Laterality: N/A;   RIGHT/LEFT HEART CATH AND CORONARY ANGIOGRAPHY N/A 04/07/2018   Procedure: RIGHT/LEFT HEART CATH AND CORONARY ANGIOGRAPHY;  Surgeon: Alwyn Pea, MD;  Location: [facility];  Service: Cardiovascular;  Laterality: N/A;   TONSILLECTOMY      OB History     Gravida  4   Para  4   Term      Preterm      AB      Living         SAB      IAB      Ectopic      Multiple      Live Births               Home Medications    Prior to Admission medications   Medication Sig Start Date End Date Taking? Authorizing Provider  cephALEXin (KEFLEX) 500 MG capsule Take 1 capsule (500 mg total) by mouth 2 (two) times daily for 5 days. 09/18/23 09/23/23 Yes Mickie Bail, NP  albuterol (VENTOLIN HFA) 108 (90 Base) MCG/ACT inhaler Inhale 2 puffs into the lungs every 6 (six) hours as needed. Using in Am daily  per MD 07/01/23 06/30/24  [provider]  aspirin 81 MG tablet Take  81 mg by mouth daily.    [provider]  clopidogrel (PLAVIX) 75 MG tablet TAKE ONE TABLET BY MOUTH DAILY Patient taking differently: Take 75 mg by mouth daily. 12/25/13   Sherlene Shams, MD  D-MANNOSE PO Take 800 mg by mouth daily.    [provider]  ELDERBERRY PO Take 1,000 mg by mouth daily.    [provider]  empagliflozin (JARDIANCE) 10 MG TABS tablet Take 1 tablet (10 mg total) by mouth daily before breakfast. 05/18/23   Sherlene Shams, MD  furosemide (LASIX) 40 MG tablet Take 40 mg by mouth daily.    [provider]  GLUCOSAMINE-CHONDROITIN PO Take 2 tablets by  mouth daily.    [provider]  isosorbide mononitrate (IMDUR) 120 MG 24 hr tablet Take 120 mg by mouth daily.     [provider]  loratadine (CLARITIN) 10 MG tablet Take 10 mg by mouth daily as needed for allergies.    [provider]  metoprolol (TOPROL-XL) 200 MG 24 hr tablet TAKE 1 TABLET BY MOUTH DAILY. 08/17/23   Sherlene Shams, MD  nitroGLYCERIN (NITROSTAT) 0.4 MG SL tablet Place 0.4 mg under the tongue every 5 (five) minutes as needed for chest pain.    [provider]  pantoprazole (PROTONIX) 40 MG tablet Take 1 tablet (40 mg total) by mouth daily. 08/17/23   Sherlene Shams, MD  PRALUENT 75 MG/ML SOAJ Inject 75 mg into the muscle every 14 (fourteen) days. 04/11/19   [provider]  ranolazine (RANEXA) 500 MG 12 hr tablet Take 500 mg by mouth 2 (two) times daily.    [provider]  sacubitril-valsartan (ENTRESTO) 49-51 MG Take 1 tablet by mouth 2 (two) times daily. 11/27/19   [provider]  spironolactone (ALDACTONE) 25 MG tablet Take 1 tablet (25 mg total) by mouth daily. 08/17/23   Sherlene Shams, MD  traZODone (DESYREL) 150 MG tablet Take 1 tablet (150 mg total) by mouth at bedtime. 08/17/23   Sherlene Shams, MD    Family History Family History  Problem Relation Age of Onset   Heart disease Mother 27       massive  MI   Heart disease Father 11       AMI   Cancer Father        metastatic lung CA   Diabetes Sister    Cancer Maternal Aunt    Breast cancer Maternal Aunt 55   Breast cancer Cousin        41s   Breast cancer Cousin        37s    Social History Social History   Tobacco Use   Smoking status: Never   Smokeless tobacco: Never  Vaping Use   Vaping status: Never Used  Substance Use Topics   Alcohol use: Yes    Alcohol/week: 1.0 standard drink of alcohol    Types: 1 Glasses of wine per week    Comment: occasional wine   Drug use: No     Allergies   Penicillins and Statins   Review of Systems Review of Systems  Constitutional:  Negative for chills and fever.  Gastrointestinal:  Negative for abdominal pain, nausea and vomiting.  Genitourinary:  Positive for dysuria, frequency and urgency. Negative for flank pain and hematuria.     Physical Exam Triage Vital Signs ED Triage Vitals  Encounter Vitals Group     BP      Systolic BP Percentile      Diastolic BP Percentile      Pulse      Resp      Temp      Temp src      SpO2      Weight      Height      Head Circumference      Peak Flow      Pain Score      Pain Loc      Pain Education      Exclude from Growth Chart    No data found.  Updated Vital Signs BP 129/82   Pulse 77   Temp 98.7 F (37.1 C)  Resp 18   SpO2 95%   Visual Acuity Right Eye Distance:   Left Eye Distance:   Bilateral Distance:    Right Eye Near:   Left Eye Near:    Bilateral Near:     Physical Exam Vitals and nursing note reviewed.  Constitutional:      General: She is not in acute distress.    Appearance: She is well-developed.  HENT:     Mouth/Throat:     Mouth: Mucous membranes are moist.  Cardiovascular:     Rate and Rhythm: Normal rate and regular rhythm.  Pulmonary:     Effort: Pulmonary effort is normal. No respiratory distress.  Abdominal:     General: Bowel sounds are normal.     Palpations: Abdomen is  soft.     Tenderness: There is no abdominal tenderness. There is no right CVA tenderness, left CVA tenderness, guarding or rebound.  Musculoskeletal:     Cervical back: Neck supple.  Skin:    General: Skin is warm and dry.  Neurological:     Mental Status: She is alert.      UC Treatments / Results  Labs (all labs ordered are listed, but only abnormal results are displayed) Labs Reviewed  POCT URINALYSIS DIP (MANUAL ENTRY) - Abnormal; Notable for the following components:      Result Value   Color, UA orange (*)    Clarity, UA turbid (*)    Glucose, UA =100 (*)    Nitrite, UA Positive (*)    Leukocytes, UA Small (1+) (*)    All other components within normal limits  URINE CULTURE    EKG   Radiology No results found.  Procedures Procedures (including critical care time)  Medications Ordered in UC Medications - No data to display  Initial Impression / Assessment and Plan / UC Course  I have reviewed the triage vital signs and the nursing notes.  Pertinent labs & imaging results that were available during my care of the patient were reviewed by me and considered in my medical decision making (see chart for details).    Acute cystitis without hematuria.  Treating with Keflex. Urine culture pending. Discussed with patient that we will call her if the urine culture shows the need to change or discontinue the antibiotic. Instructed her to follow-up with her PCP if her symptoms are not improving. Patient agrees to plan of care.     Final Clinical Impressions(s) / UC Diagnoses   Final diagnoses:  Acute cystitis without hematuria     Discharge Instructions      Take the antibiotic as directed.  The urine culture is pending.  We will call you if it shows the need to change or discontinue your antibiotic.    Follow-up with your primary care provider if your symptoms are not improving.      ED Prescriptions     Medication Sig Dispense Auth. Provider   cephALEXin  (KEFLEX) 500 MG capsule Take 1 capsule (500 mg total) by mouth 2 (two) times daily for 5 days. 10 capsule Mickie Bail, NP      PDMP not reviewed this encounter.   Mickie Bail, NP 09/18/23 1106

## 2023-09-18 NOTE — ED Triage Notes (Signed)
Patient to Urgent Care with complaints of dysuria/ urinary frequency and urgency. Denies any known fevers. Suprapubic pressure.  Symptoms started apport 1 week ago. Has been taking azo/ cranberry.

## 2023-09-18 NOTE — Discharge Instructions (Addendum)
Take the antibiotic as directed.  The urine culture is pending.  We will call you if it shows the need to change or discontinue your antibiotic.    Follow up with your primary care provider if your symptoms are not improving.    

## 2023-09-19 LAB — URINE CULTURE: Culture: 30000 — AB

## 2023-09-21 ENCOUNTER — Encounter: Payer: Medicare Other | Admitting: *Deleted

## 2023-09-21 ENCOUNTER — Ambulatory Visit: Payer: Medicare Other | Admitting: Family

## 2023-09-21 ENCOUNTER — Encounter: Payer: Self-pay | Admitting: Family

## 2023-09-21 VITALS — BP 116/76 | HR 81 | Temp 98.8°F | Ht 64.0 in | Wt 176.0 lb

## 2023-09-21 DIAGNOSIS — Z9861 Coronary angioplasty status: Secondary | ICD-10-CM | POA: Diagnosis not present

## 2023-09-21 DIAGNOSIS — N390 Urinary tract infection, site not specified: Secondary | ICD-10-CM

## 2023-09-21 DIAGNOSIS — A499 Bacterial infection, unspecified: Secondary | ICD-10-CM

## 2023-09-21 DIAGNOSIS — R300 Dysuria: Secondary | ICD-10-CM

## 2023-09-21 LAB — POC URINALSYSI DIPSTICK (AUTOMATED)
Bilirubin, UA: NEGATIVE
Glucose, UA: POSITIVE — AB
Ketones, UA: NEGATIVE
Nitrite, UA: NEGATIVE
Protein, UA: NEGATIVE
Spec Grav, UA: 1.02 (ref 1.010–1.025)
Urobilinogen, UA: 0.2 U/dL
pH, UA: 6 (ref 5.0–8.0)

## 2023-09-21 MED ORDER — SULFAMETHOXAZOLE-TRIMETHOPRIM 800-160 MG PO TABS: 1.0000 | ORAL_TABLET | Freq: Two times a day (BID) | ORAL | 0 refills | Status: DC

## 2023-09-21 MED ORDER — FLUCONAZOLE 150 MG PO TABS: 150.0000 mg | ORAL_TABLET | Freq: Every day | ORAL | 0 refills | Status: DC

## 2023-09-21 NOTE — Progress Notes (Signed)
Daily Session Note  Patient Details  Name: Stacie Mcdonald MRN: [id number] Date of Birth: 28-Feb-1946 Referring Provider:   Flowsheet Row Cardiac Rehab from 07/15/2023 in [facility]  Referring Provider Dr. Dorothyann Peng, MD       Encounter Date: 09/21/2023  Check In:  Session Check In - 09/21/23 0936       Check-In   Supervising physician immediately available to respond to emergencies See telemetry face sheet for immediately available ER MD    Location [facility]    Staff Present Cora Collum, RN, BSN, CCRP;Margaret Best, MS, Exercise Physiologist;Noah Tickle, BS, Exercise Physiologist;Maxon Conetta BS, , Exercise Physiologist    Virtual Visit No    Medication changes reported     No    Fall or balance concerns reported    No    Warm-up and Cool-down Performed on first and last piece of equipment    Resistance Training Performed Yes    VAD Patient? No    PAD/SET Patient? No      Pain Assessment   Currently in Pain? No/denies                Social History   Tobacco Use  Smoking Status Never  Smokeless Tobacco Never    Goals Met:  Independence with exercise equipment Exercise tolerated well No report of concerns or symptoms today  Goals Unmet:  Not Applicable  Comments: Pt able to follow exercise prescription today without complaint.  Will continue to monitor for progression.    Dr. Bethann Punches is Medical Director for [facility].  Dr. Vida Rigger is Medical Director for [facility].

## 2023-09-21 NOTE — Progress Notes (Signed)
Acute Office Visit  Subjective:     Patient ID: Stacie Mcdonald, female    DOB: August 12, 1946, 77 y.o.   MRN: [id number]  Chief Complaint  Patient presents with   Urinary Tract Infection    HPI Patient is in today after being seen on 09/18/2019 for at the urgent care for urinary tract infection.  She was prescribed cephalexin 500 mg twice daily.  Has been taking the medication but her symptoms have not improved.  Continues to have urinary frequency, pressure, pain denies any blood in her urine.  Denies any fever or chills.  Urine culture showed Candida.  Review of Systems  Respiratory: Negative.    Cardiovascular: Negative.   Genitourinary:  Positive for dysuria, frequency and urgency.  Musculoskeletal: Negative.   Neurological: Negative.   Psychiatric/Behavioral: Negative.    All other systems reviewed and are negative.   Past Medical History:  Diagnosis Date   Allergy    Arthritis    fingers, knees   Asthma    Breast Cancer 1991   R mastectomy. 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   Sleep apnea    CPAP   Stable angina (HCC) 07/27/2019   Thoracic vertebral fracture (HCC) 04/05/2012   Urinary tract infection    Wears contact lenses    left only    Social History   Socioeconomic History   Marital status: Married    Spouse name: Gabriel Carina    Number of children: 4   Years of  education: bachelors   Highest education level: Not on file  Occupational History   Occupation: RETIRED  Tobacco Use   Smoking status: Never   Smokeless tobacco: Never  Vaping Use   Vaping status: Never Used  Substance and Sexual Activity   Alcohol use: Yes    Alcohol/week: 1.0 standard drink of alcohol    Types: 1 Glasses of wine per week    Comment: occasional wine   Drug use: No   Sexual activity: Yes  Other Topics Concern   Not on file  Social History Narrative   Not on file   Social Determinants of Health   Financial Resource Strain: Low Risk  (08/11/2023)   Received from [facility]   Overall Financial Resource Strain (CARDIA)    Difficulty of Paying Living Expenses: Not very hard  Food Insecurity: No Food Insecurity (08/12/2023)   Hunger Vital Sign    Worried About Running Out of Food in the Last Year: Never true    Ran Out of Food in the Last Year: Never true  Transportation Needs: No Transportation Needs (08/12/2023)   PRAPARE - Administrator, Civil Service (Medical): No    Lack of Transportation (Non-Medical): No  Physical Activity: Insufficiently Active (05/17/2023)  Exercise Vital Sign    Days of Exercise per Week: 3 days    Minutes of Exercise per Session: 30 min  Stress: No Stress Concern Present (05/17/2023)   [employer] of Occupational Health - Occupational Stress Questionnaire    Feeling of Stress : Only a little  Social Connections: Socially Integrated (05/17/2023)   Social Connection and Isolation Panel [NHANES]    Frequency of Communication with Friends and Family: More than three times a week    Frequency of Social Gatherings with Friends and Family: More than three times a week    Attends Religious Services: More than 4 times per year    Active Member of [employer] or Organizations: Yes    Attends Engineer, structural: More than 4 times per year    Marital Status: Married  Catering manager Violence: Not At Risk  (08/07/2023)   Humiliation, Afraid, Rape, and Kick questionnaire    Fear of Current or Ex-Partner: No    Emotionally Abused: No    Physically Abused: No    Sexually Abused: No    Past Surgical History:  Procedure Laterality Date   BREAST BIOPSY     right breast reconstruction S/P mastectomy   BREAST SURGERY     right mastectomy   CATARACT EXTRACTION W/PHACO Left 05/01/2020   Procedure: CATARACT EXTRACTION PHACO AND INTRAOCULAR LENS PLACEMENT (IOC) LEFT 5.45  00:48.6  11.2%;  Surgeon: Lockie Mola, MD;  Location: [facility];  Service: Ophthalmology;  Laterality: Left;  sleep apnea   COLONOSCOPY WITH PROPOFOL N/A 08/18/2019   Procedure: COLONOSCOPY WITH PROPOFOL;  Surgeon: Midge Minium, MD;  Location: [facility];  Service: Endoscopy;  Laterality: N/A;  sleep apnea   CORONARY ANGIOPLASTY WITH STENT PLACEMENT  Jan 2013   mid RCA 75% occlusion resolved to 0%, [place]   CORONARY STENT INTERVENTION N/A 04/07/2018   Procedure: CORONARY STENT INTERVENTION;  Surgeon: Alwyn Pea, MD;  Location: [facility] INVASIVE CV [facility];  Service: Cardiovascular;  Laterality: N/A;   CORONARY STENT INTERVENTION N/A 02/20/2021   Procedure: CORONARY STENT INTERVENTION;  Surgeon: Alwyn Pea, MD;  Location: [facility];  Service: Cardiovascular;  Laterality: N/A;   CORONARY STENT INTERVENTION N/A 06/25/2023   Procedure: CORONARY STENT INTERVENTION;  Surgeon: Alwyn Pea, MD;  Location: [facility];  Service: Cardiovascular;  Laterality: N/A;   LEFT HEART CATH AND CORONARY ANGIOGRAPHY N/A 02/20/2021   Procedure: LEFT HEART CATH AND CORONARY ANGIOGRAPHY;  Surgeon: Alwyn Pea, MD;  Location: [facility];  Service: Cardiovascular;  Laterality: N/A;   LEFT HEART CATH AND CORONARY ANGIOGRAPHY Left 06/25/2023   Procedure: LEFT HEART CATH AND CORONARY ANGIOGRAPHY;  Surgeon: Alwyn Pea, MD;  Location: [facility];  Service: Cardiovascular;   Laterality: Left;   MASTECTOMY Right 1991   BREAST CA   POLYPECTOMY  08/18/2019   Procedure: POLYPECTOMY;  Surgeon: Midge Minium, MD;  [facility];  Service: Endoscopy;;   PSEUDOANERYSM COMPRESSION N/A 02/27/2021   Procedure: Chiquita Loth COMPRESSION;  Surgeon: Renford Dills, MD;  Location: [facility];  Service: Cardiovascular;  Laterality: N/A;   RIGHT/LEFT HEART CATH AND CORONARY ANGIOGRAPHY N/A 04/07/2018   Procedure: RIGHT/LEFT HEART CATH AND CORONARY ANGIOGRAPHY;  Surgeon: Alwyn Pea, MD;  Location: [facility];  Service: Cardiovascular;  Laterality: N/A;   TONSILLECTOMY      Family History  Problem Relation Age of Onset   Heart disease Mother 42  massive MI   Heart disease Father 79       AMI   Cancer Father        metastatic lung CA   Diabetes Sister    Cancer Maternal Aunt    Breast cancer Maternal Aunt 40   Breast cancer Cousin        58s   Breast cancer Cousin        53s    Allergies  Allergen Reactions   Penicillins Hives    Has patient had a PCN reaction causing immediate rash, facial/tongue/throat swelling, SOB or lightheadedness with hypotension: No Has patient had a PCN reaction causing severe rash involving mucus membranes or skin necrosis: Yes Has patient had a PCN reaction that required hospitalization: No Has patient had a PCN reaction occurring within the last 10 years: Yes If all of the above answers are "NO", then may proceed with Cephalosporin use.    Statins     Other reaction(s): Muscle Pain    Current Outpatient Medications on File Prior to Visit  Medication Sig Dispense Refill   albuterol (VENTOLIN HFA) 108 (90 Base) MCG/ACT inhaler Inhale 2 puffs into the lungs every 6 (six) hours as needed. Using in Am daily  per MD     aspirin 81 MG tablet Take 81 mg by mouth daily.     cephALEXin (KEFLEX) 500 MG capsule Take 1 capsule (500 mg total) by mouth 2 (two) times daily for 5 days. 10 capsule 0    clopidogrel (PLAVIX) 75 MG tablet TAKE ONE TABLET BY MOUTH DAILY (Patient taking differently: Take 75 mg by mouth daily.) 90 tablet 1   D-MANNOSE PO Take 800 mg by mouth daily.     ELDERBERRY PO Take 1,000 mg by mouth daily.     empagliflozin (JARDIANCE) 10 MG TABS tablet Take 1 tablet (10 mg total) by mouth daily before breakfast. 90 tablet 2   furosemide (LASIX) 40 MG tablet Take 40 mg by mouth daily.     GLUCOSAMINE-CHONDROITIN PO Take 2 tablets by mouth daily.     isosorbide mononitrate (IMDUR) 120 MG 24 hr tablet Take 120 mg by mouth daily.      loratadine (CLARITIN) 10 MG tablet Take 10 mg by mouth daily as needed for allergies.     metoprolol (TOPROL-XL) 200 MG 24 hr tablet TAKE 1 TABLET BY MOUTH DAILY. 90 tablet 1   nitroGLYCERIN (NITROSTAT) 0.4 MG SL tablet Place 0.4 mg under the tongue every 5 (five) minutes as needed for chest pain.     pantoprazole (PROTONIX) 40 MG tablet Take 1 tablet (40 mg total) by mouth daily. 90 tablet 1   PRALUENT 75 MG/ML SOAJ Inject 75 mg into the muscle every 14 (fourteen) days.     ranolazine (RANEXA) 500 MG 12 hr tablet Take 500 mg by mouth 2 (two) times daily.     sacubitril-valsartan (ENTRESTO) 49-51 MG Take 1 tablet by mouth 2 (two) times daily.     spironolactone (ALDACTONE) 25 MG tablet Take 1 tablet (25 mg total) by mouth daily.     traZODone (DESYREL) 150 MG tablet Take 1 tablet (150 mg total) by mouth at bedtime. 90 tablet 0   No current facility-administered medications on file prior to visit.    BP 116/76   Pulse 81   Temp 98.8 F (37.1 C)   Ht 5\' 4"  (1.626 m)   Wt 176 lb (79.8 kg)   SpO2 96%   BMI 30.21 kg/m chart  Objective:    There were no vitals taken for this visit.   Physical Exam Vitals and nursing note reviewed.  Constitutional:      Appearance: Normal appearance.  Cardiovascular:     Rate and Rhythm: Normal rate and regular rhythm.  Pulmonary:     Effort: Pulmonary effort is normal.     Breath sounds: Normal  breath sounds.  Abdominal:     General: Abdomen is flat. Bowel sounds are normal.     Palpations: Abdomen is soft.  Musculoskeletal:        General: Normal range of motion.  Skin:    General: Skin is warm and dry.  Neurological:     General: No focal deficit present.     Mental Status: She is alert and oriented to person, place, and time.  Psychiatric:        Mood and Affect: Mood normal.        Behavior: Behavior normal.     No results found for any visits on 09/21/23.      Assessment & Plan:   Problem List Items Addressed This Visit   None Visit Diagnoses     Bacterial urinary infection    -  Primary   Relevant Medications   fluconazole (DIFLUCAN) 150 MG tablet   sulfamethoxazole-trimethoprim (BACTRIM DS) 800-160 MG tablet   Other Relevant Orders   Urine Culture   Dysuria           Meds ordered this encounter  Medications   fluconazole (DIFLUCAN) 150 MG tablet    Sig: Take 1 tablet (150 mg total) by mouth daily.    Dispense:  1 tablet    Refill:  0   sulfamethoxazole-trimethoprim (BACTRIM DS) 800-160 MG tablet    Sig: Take 1 tablet by mouth 2 (two) times daily.    Dispense:  10 tablet    Refill:  0   Call the office if symptoms worsen or persist.  Recheck as scheduled and sooner as needed.  DC cephalexin and start Bactrim. No follow-ups on file.  Eulis Foster, FNP

## 2023-09-22 LAB — URINE CULTURE
MICRO NUMBER:: 15688409
SPECIMEN QUALITY:: ADEQUATE

## 2023-09-23 ENCOUNTER — Encounter: Payer: Medicare Other | Admitting: *Deleted

## 2023-09-23 DIAGNOSIS — Z9861 Coronary angioplasty status: Secondary | ICD-10-CM

## 2023-09-23 NOTE — Progress Notes (Signed)
Daily Session Note  Patient Details  Name: Stacie Mcdonald MRN: [id number] Date of Birth: 01/22/1946 Referring Provider:   Flowsheet Row Cardiac Rehab from 07/15/2023 in [facility] Cardiac and Pulmonary Rehab  Referring Provider Dr. Dorothyann Peng, MD       Encounter Date: 09/23/2023  Check In:  Session Check In - 09/23/23 0927       Check-In   Supervising physician immediately available to respond to emergencies See telemetry face sheet for immediately available ER MD    Location [facility]-Cardiac & Pulmonary Rehab    Staff Present Ronette Deter, BS, Exercise Physiologist;Susanne Bice, RN, BSN, CCRP;Joseph Hood, Guinevere Ferrari, RN, [place]    Virtual Visit No    Medication changes reported     No    Fall or balance concerns reported    No    Warm-up and Cool-down Performed on first and last piece of equipment    Resistance Training Performed Yes    VAD Patient? No    PAD/SET Patient? No      Pain Assessment   Currently in Pain? No/denies                Social History   Tobacco Use  Smoking Status Never  Smokeless Tobacco Never    Goals Met:  Independence with exercise equipment Exercise tolerated well No report of concerns or symptoms today Strength training completed today  Goals Unmet:  Not Applicable  Comments: Pt able to follow exercise prescription today without complaint.  Will continue to monitor for progression.    Dr. Bethann Punches is Medical Director for [facility] Cardiac Rehabilitation.  Dr. Vida Rigger is Medical Director for [facility] Pulmonary Rehabilitation.

## 2023-09-24 ENCOUNTER — Encounter: Payer: Medicare Other | Admitting: *Deleted

## 2023-09-24 DIAGNOSIS — Z9861 Coronary angioplasty status: Secondary | ICD-10-CM

## 2023-09-24 NOTE — Progress Notes (Signed)
Daily Session Note  Patient Details  Name: Stacie Mcdonald MRN: [id number] Date of Birth: 1946/06/12 Referring Provider:   Flowsheet Row Cardiac Rehab from 07/15/2023 in [facility] Cardiac and Pulmonary Rehab  Referring Provider Dr. Dorothyann Peng, MD       Encounter Date: 09/24/2023  Check In:  Session Check In - 09/24/23 0930       Check-In   Supervising physician immediately available to respond to emergencies See telemetry face sheet for immediately available ER MD    Location [facility]    Staff Present Cora Collum, RN, BSN, CCRP;Noah Tickle, BS, Exercise Physiologist;Joseph [place], [place]    Virtual Visit No    Medication changes reported     No    Fall or balance concerns reported    No    Warm-up and Cool-down Performed on first and last piece of equipment    Resistance Training Performed Yes    VAD Patient? No    PAD/SET Patient? No      Pain Assessment   Currently in Pain? No/denies                Social History   Tobacco Use  Smoking Status Never  Smokeless Tobacco Never    Goals Met:  Independence with exercise equipment Exercise tolerated well No report of concerns or symptoms today  Goals Unmet:  Not Applicable  Comments: Pt able to follow exercise prescription today without complaint.  Will continue to monitor for progression.    Dr. Bethann Punches is Medical Director for [facility] Cardiac Rehabilitation.  Dr. Vida Rigger is Medical Director for [facility] Pulmonary Rehabilitation.

## 2023-09-28 DIAGNOSIS — Z9861 Coronary angioplasty status: Secondary | ICD-10-CM | POA: Diagnosis not present

## 2023-09-28 NOTE — Progress Notes (Signed)
Daily Session Note  Patient Details  Name: Stacie Mcdonald MRN: [id number] Date of Birth: [birth year] Referring Provider:   Flowsheet Row Cardiac Rehab from 07/15/2023 in [facility] Cardiac and Pulmonary Rehab  Referring Provider Dr. Dorothyann Peng, MD       Encounter Date: 09/28/2023  Check In:  Session Check In - 09/28/23 0925       Check-In   Supervising physician immediately available to respond to emergencies See telemetry face sheet for immediately available ER MD    Location [facility]-Cardiac [facility] Pulmonary Rehab    Staff Present Maxon Conetta BS, , Exercise Physiologist;Margaret Best, MS, Exercise Physiologist;Titania Gault, RN, BSN;Noah Tickle, BS, Exercise Physiologist    Virtual Visit No    Medication changes reported     No    Fall or balance concerns reported    No    Tobacco Cessation No Change    Warm-up and Cool-down Performed on first and last piece of equipment    Resistance Training Performed Yes    VAD Patient? No    PAD/SET Patient? No      Pain Assessment   Currently in Pain? No/denies                Social History   Tobacco Use  Smoking Status Never  Smokeless Tobacco Never    Goals Met:  Independence with exercise equipment Exercise tolerated well No report of concerns or symptoms today Strength training completed today  Goals Unmet:  Not Applicable  Comments: Pt able to follow exercise prescription today without complaint.  Will continue to monitor for progression.   Dr. Bethann Punches is Medical Director for [facility] Cardiac Rehabilitation.  Dr. Vida Rigger is Medical Director for [facility] Pulmonary Rehabilitation.

## 2023-09-30 ENCOUNTER — Encounter: Payer: Medicare Other | Admitting: *Deleted

## 2023-09-30 DIAGNOSIS — Z9861 Coronary angioplasty status: Secondary | ICD-10-CM

## 2023-09-30 NOTE — Progress Notes (Signed)
Daily Session Note  Patient Details  Name: Stacie Mcdonald MRN: [id number] Date of Birth: 11/05/1946 Referring Provider:   Flowsheet Row Cardiac Rehab from 07/15/2023 in [facility] Cardiac and Pulmonary Rehab  Referring Provider Dr. Dorothyann Peng, MD       Encounter Date: 09/30/2023  Check In:  Session Check In - 09/30/23 0908       Check-In   Supervising physician immediately available to respond to emergencies See telemetry face sheet for immediately available ER MD    Location [facility]-Cardiac & Pulmonary Rehab    Staff Present Cora Collum, RN, BSN, CCRP;Margaret Best, MS, Exercise Physiologist;Berdina Cheever Katrinka Blazing, RN, ADN    Virtual Visit No    Medication changes reported     No    Fall or balance concerns reported    No    Warm-up and Cool-down Performed on first and last piece of equipment    Resistance Training Performed Yes    VAD Patient? No    PAD/SET Patient? No      Pain Assessment   Currently in Pain? No/denies                Social History   Tobacco Use  Smoking Status Never  Smokeless Tobacco Never    Goals Met:  Independence with exercise equipment Exercise tolerated well No report of concerns or symptoms today Strength training completed today  Goals Unmet:  Not Applicable  Comments: Pt able to follow exercise prescription today without complaint.  Will continue to monitor for progression.    Dr. Bethann Punches is Medical Director for [facility] Cardiac Rehabilitation.  Dr. Vida Rigger is Medical Director for [facility] Pulmonary Rehabilitation.

## 2023-10-01 ENCOUNTER — Encounter: Payer: Medicare Other | Admitting: *Deleted

## 2023-10-01 DIAGNOSIS — Z9861 Coronary angioplasty status: Secondary | ICD-10-CM | POA: Diagnosis not present

## 2023-10-01 NOTE — Progress Notes (Signed)
Daily Session Note  Patient Details  Name: Stacie Mcdonald MRN: [id number] Date of Birth: 1946-03-23 Referring Provider:   Flowsheet Row Cardiac Rehab from 07/15/2023 in [facility] Cardiac and Pulmonary Rehab  Referring Provider Dr. Dorothyann Peng, MD       Encounter Date: 10/01/2023  Check In:  Session Check In - 10/01/23 0942       Check-In   Supervising physician immediately available to respond to emergencies See telemetry face sheet for immediately available ER MD    Location [facility]-Cardiac [facility] Pulmonary Rehab    Staff Present Cora Collum, RN, BSN, CCRP;Joseph Hood, RCP,RRT,BSRT;Noah Tickle, [place], Exercise Physiologist    Virtual Visit No    Medication changes reported     No    Fall or balance concerns reported    No    Warm-up and Cool-down Performed on first and last piece of equipment    Resistance Training Performed Yes    VAD Patient? No    PAD/SET Patient? No      Pain Assessment   Currently in Pain? No/denies                Social History   Tobacco Use  Smoking Status Never  Smokeless Tobacco Never    Goals Met:  Independence with exercise equipment Exercise tolerated well No report of concerns or symptoms today  Goals Unmet:  Not Applicable  Comments: Pt able to follow exercise prescription today without complaint.  Will continue to monitor for progression.    Dr. Bethann Punches is Medical Director for [facility] Cardiac Rehabilitation.  Dr. Vida Rigger is Medical Director for [facility] Pulmonary Rehabilitation.

## 2023-10-05 ENCOUNTER — Encounter: Payer: Medicare Other | Admitting: *Deleted

## 2023-10-05 DIAGNOSIS — Z9861 Coronary angioplasty status: Secondary | ICD-10-CM | POA: Diagnosis not present

## 2023-10-05 NOTE — Progress Notes (Signed)
Daily Session Note  Patient Details  Name: Stacie Mcdonald MRN: [id number] Date of Birth: 04/01/1946 Referring Provider:   Flowsheet Row Cardiac Rehab from 07/15/2023 in [facility] Cardiac and Pulmonary Rehab  Referring Provider Dr. Dorothyann Peng, MD       Encounter Date: 10/05/2023  Check In:  Session Check In - 10/05/23 0937       Check-In   Supervising physician immediately available to respond to emergencies See telemetry face sheet for immediately available ER MD    Location [facility]-Cardiac & Pulmonary Rehab    Staff Present Cora Collum, RN, BSN, CCRP;Noah Tickle, BS, Exercise Physiologist;Maxon Conetta BS, , Exercise Physiologist;Margaret Best, MS, Exercise Physiologist    Virtual Visit No    Medication changes reported     No    Fall or balance concerns reported    No    Warm-up and Cool-down Performed on first and last piece of equipment    Resistance Training Performed Yes    VAD Patient? No    PAD/SET Patient? No      Pain Assessment   Currently in Pain? No/denies                Social History   Tobacco Use  Smoking Status Never  Smokeless Tobacco Never    Goals Met:  Independence with exercise equipment Exercise tolerated well No report of concerns or symptoms today  Goals Unmet:  Not Applicable  Comments: Pt able to follow exercise prescription today without complaint.  Will continue to monitor for progression.    Dr. Bethann Punches is Medical Director for [facility] Cardiac Rehabilitation.  Dr. Vida Rigger is Medical Director for [facility] Pulmonary Rehabilitation.

## 2023-10-06 ENCOUNTER — Encounter: Payer: Self-pay | Admitting: *Deleted

## 2023-10-06 DIAGNOSIS — Z9861 Coronary angioplasty status: Secondary | ICD-10-CM

## 2023-10-06 NOTE — Progress Notes (Signed)
Cardiac Individual Treatment Plan  Patient Details  Name: Stacie Mcdonald MRN: [id number] Date of Birth: [birth year] Referring Provider:   Flowsheet Row Cardiac Rehab from 07/15/2023 in [facility] Cardiac and Pulmonary Rehab  Referring Provider Dr. Dorothyann Peng, MD       Initial Encounter Date:  Flowsheet Row Cardiac Rehab from 07/15/2023 in [facility] Cardiac and Pulmonary Rehab  Date 07/15/23       Visit Diagnosis: S/P PTCA (percutaneous transluminal coronary angioplasty)  Patient's Home Medications on Admission:  Current Outpatient Medications:    albuterol (VENTOLIN HFA) 108 (90 Base) MCG/ACT inhaler, Inhale 2 puffs into the lungs every 6 (six) hours as needed. Using in Am daily  per MD, Disp: , Rfl:    aspirin 81 MG tablet, Take 81 mg by mouth daily., Disp: , Rfl:    clopidogrel (PLAVIX) 75 MG tablet, TAKE ONE TABLET BY MOUTH DAILY (Patient taking differently: Take 75 mg by mouth daily.), Disp: 90 tablet, Rfl: 1   D-MANNOSE PO, Take 800 mg by mouth daily., Disp: , Rfl:    ELDERBERRY PO, Take 1,000 mg by mouth daily., Disp: , Rfl:    empagliflozin (JARDIANCE) 10 MG TABS tablet, Take 1 tablet (10 mg total) by mouth daily before breakfast., Disp: 90 tablet, Rfl: 2   fluconazole (DIFLUCAN) 150 MG tablet, Take 1 tablet (150 mg total) by mouth daily., Disp: 1 tablet, Rfl: 0   furosemide (LASIX) 40 MG tablet, Take 40 mg by mouth daily., Disp: , Rfl:    GLUCOSAMINE-CHONDROITIN PO, Take 2 tablets by mouth daily., Disp: , Rfl:    isosorbide mononitrate (IMDUR) 120 MG 24 hr tablet, Take 120 mg by mouth daily. , Disp: , Rfl:    loratadine (CLARITIN) 10 MG tablet, Take 10 mg by mouth daily as needed for allergies., Disp: , Rfl:    metoprolol (TOPROL-XL) 200 MG 24 hr tablet, TAKE 1 TABLET BY MOUTH DAILY., Disp: 90 tablet, Rfl: 1   nitroGLYCERIN (NITROSTAT) 0.4 MG SL tablet, Place 0.4 mg under the tongue every 5 (five) minutes as needed for chest pain., Disp: , Rfl:    pantoprazole (PROTONIX) 40 MG  tablet, Take 1 tablet (40 mg total) by mouth daily., Disp: 90 tablet, Rfl: 1   PRALUENT 75 MG/ML SOAJ, Inject 75 mg into the muscle every 14 (fourteen) days., Disp: , Rfl:    ranolazine (RANEXA) 500 MG 12 hr tablet, Take 500 mg by mouth 2 (two) times daily., Disp: , Rfl:    sacubitril-valsartan (ENTRESTO) 49-51 MG, Take 1 tablet by mouth 2 (two) times daily., Disp: , Rfl:    spironolactone (ALDACTONE) 25 MG tablet, Take 1 tablet (25 mg total) by mouth daily., Disp: , Rfl:    sulfamethoxazole-trimethoprim (BACTRIM DS) 800-160 MG tablet, Take 1 tablet by mouth 2 (two) times daily., Disp: 10 tablet, Rfl: 0   traZODone (DESYREL) 150 MG tablet, Take 1 tablet (150 mg total) by mouth at bedtime., Disp: 90 tablet, Rfl: 0  Past Medical History: Past Medical History:  Diagnosis Date   Allergy    Arthritis    fingers, knees   Asthma    Breast Cancer 1991   R mastectomy. 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   Sleep apnea    CPAP   Stable angina (HCC) 07/27/2019   Thoracic vertebral fracture (HCC) 04/05/2012   Urinary tract infection    Wears contact lenses    left only    Tobacco Use: Social History   Tobacco Use  Smoking Status Never  Smokeless Tobacco Never    Labs: Review Flowsheet  More data exists      Latest Ref Rng & Units 05/06/2022 11/23/2022 08/09/2023 08/17/2023 09/08/2023  Labs for ITP  Cardiac and Pulmonary Rehab  Cholestrol 0 - 200 mg/dL 409  811  - 914  782   LDL (calc) 0 - 99 mg/dL 58  46  - 47  41   Direct LDL mg/dL 95.6  21.3  - 08.6  57.8   HDL-C >39.00 mg/dL 46.96  29.52  - 84.13  34.10   Trlycerides 0.0 - 149.0 mg/dL 244.0  102.7  - 253.6  189.0   Hemoglobin A1c - 6.6  6.3  6.0     - -    Details       This result is from an external source.          Exercise Target Goals: Exercise Program Goal: Individual exercise prescription set using results from initial 6 min walk test and THRR while considering  patient's activity barriers and safety.   Exercise Prescription Goal: Initial exercise prescription builds to 30-45 minutes a day of aerobic activity, 2-3 days per week.  Home exercise guidelines will be given to patient during program as part of exercise prescription that the participant will acknowledge.   Education: Aerobic Exercise: - Group verbal and visual presentation on the components of exercise prescription. Introduces F.I.T.T principle from ACSM for exercise prescriptions.  Reviews F.I.T.T. principles of aerobic exercise including progression. Written material given at graduation. Flowsheet Row Cardiac Rehab from 09/30/2023 in [facility] Cardiac and Pulmonary Rehab  Date 09/16/23  Educator MB  Instruction Review Code 1- Verbalizes Understanding       Education: Resistance Exercise: - Group verbal and visual presentation on the components of exercise prescription. Introduces F.I.T.T principle from ACSM for exercise prescriptions  Reviews F.I.T.T. principles of resistance exercise including progression. Written material given at graduation.    Education: Exercise & Equipment Safety: - Individual verbal instruction and demonstration of equipment use and safety with use of the equipment. Flowsheet Row Cardiac Rehab from 09/30/2023 in [facility] Cardiac and Pulmonary Rehab  Date 07/15/23  Educator NT  Instruction Review Code 1- Verbalizes Understanding        Education: Exercise Physiology & General Exercise Guidelines: - Group verbal and written instruction with models to review the exercise physiology of the cardiovascular system and associated critical values. Provides general exercise guidelines with specific guidelines to those with heart or lung disease.  Flowsheet Row Cardiac Rehab from 09/30/2023 in [facility] Cardiac and Pulmonary Rehab  Date 09/02/23  Educator MB  Instruction Review Code 1- [employer] Understanding       Education: Flexibility, Balance, Mind/Body Relaxation: - Group verbal and visual presentation with interactive activity on the components of exercise  prescription. Introduces F.I.T.T principle from ACSM for exercise prescriptions. Reviews F.I.T.T. principles of flexibility and balance exercise training including progression. Also discusses the mind body connection.  Reviews various relaxation techniques to help reduce and manage stress (i.e. Deep breathing, progressive muscle relaxation, and visualization). Balance handout provided to take home. Written material given at graduation. Flowsheet Row Cardiac Rehab from 08/18/2018 in [facility] Cardiac and Pulmonary Rehab  Date 06/28/18  Educator AS  Instruction Review Code 5- Refused Teaching       Activity Barriers & Risk Stratification:  Activity Barriers & Cardiac Risk Stratification - 07/15/23 1439       Activity Barriers & Cardiac Risk Stratification   Activity Barriers Shortness of Breath;Chest Pain/Angina;Deconditioning;Muscular Weakness    Cardiac Risk Stratification High             6 Minute Walk:  6 Minute Walk     Row Name 07/15/23 1438         6 Minute Walk   Phase Initial     Distance 1055 feet     Walk Time 6 minutes     # of Rest Breaks 0     MPH 1.99     METS 1.92     RPE 15     Perceived Dyspnea  2     VO2 Peak 6.72     Symptoms Yes (comment)     Comments SOB, leg weakness     Resting HR 80 bpm     Resting BP 124/68     Resting  Oxygen Saturation  93 %     Exercise Oxygen Saturation  during 6 min walk 93 %     Max Ex. HR 108 bpm     Max Ex. BP 142/74     2 Minute Post BP 120/68              Oxygen Initial Assessment:   Oxygen Re-Evaluation:   Oxygen Discharge (Final Oxygen Re-Evaluation):   Initial Exercise Prescription:  Initial Exercise Prescription - 07/15/23 1400       Date of Initial Exercise RX and Referring Provider   Date 07/15/23    Referring Provider Dr. Dorothyann Peng, MD      Oxygen   Maintain Oxygen Saturation 88% or higher      Treadmill   MPH 1.4    Grade 0    Minutes 15    METs 2.07      NuStep   Level 2    SPM 80    Minutes 15    METs 1.92      Biostep-RELP   Level 1    SPM 50    Minutes 15    METs 1.92      Prescription Details   Frequency (times per week) 3    Duration Progress to 30 minutes of continuous aerobic without signs/symptoms of physical distress      Intensity   THRR 40-80% of Max Heartrate 104-129    Ratings of Perceived Exertion 11-13    Perceived Dyspnea 0-4      Progression   Progression Continue to progress workloads to maintain intensity without signs/symptoms of physical distress.      Resistance Training   Training Prescription Yes    Weight 3 lb    Reps 10-15             Perform Capillary Blood Glucose checks as needed.  Exercise Prescription Changes:   Exercise Prescription Changes     Row  Name 07/15/23 1400 07/29/23 1300 08/10/23 1400 08/25/23 1100 09/09/23 0700     Response to Exercise   Blood Pressure (Admit) 124/68 102/58 124/62 112/62 102/60   Blood Pressure (Exercise) 142/74 134/64 126/66 132/66 142/70   Blood Pressure (Exit) 120/68 106/60 130/70 100/56 102/58   Heart Rate (Admit) 80 bpm 93 bpm 84 bpm 89 bpm 96 bpm   Heart Rate (Exercise) 108 bpm 107 bpm 112 bpm 115 bpm 102 bpm   Heart Rate (Exit) 87 bpm 89 bpm 84 bpm 88 bpm 78 bpm   Oxygen Saturation (Admit) 93 % -- -- -- --   Oxygen Saturation (Exercise)  93 % -- -- -- --   Rating of Perceived Exertion (Exercise) 15 14 15 15 15    Perceived Dyspnea (Exercise) 2 -- -- 0 --   Symptoms SOB, leg weakness none none none none   Comments Results First two days of exercise -- -- --   Duration -- Progress to 30 minutes of  aerobic without signs/symptoms of physical distress Progress to 30 minutes of  aerobic without signs/symptoms of physical distress Progress to 30 minutes of  aerobic without signs/symptoms of physical distress Progress to 30 minutes of  aerobic without signs/symptoms of physical distress   Intensity -- THRR unchanged THRR unchanged THRR unchanged THRR unchanged     Progression   Progression -- Continue to progress workloads to maintain intensity without signs/symptoms of physical distress. Continue to progress workloads to maintain intensity without signs/symptoms of physical distress. Continue to progress workloads to maintain intensity without signs/symptoms of physical distress. Continue to progress workloads to maintain intensity without signs/symptoms of physical distress.   Average METs -- 2.04 2.04 2.17 2.27     Resistance Training   Training Prescription -- Yes Yes Yes Yes   Weight -- 3 lb 3 lb 3 lb 3 lb   Reps -- 10-15 10-15 10-15 10-15     Interval Training   Interval Training -- No No No No     Treadmill   MPH -- 1.4 1.4 1.5 1.6   Grade -- 0 0 0 0   Minutes -- 15 15 15 15    METs -- 2.07 2.07 2.15 2.23     Recumbant Bike   Level -- -- -- 1 1   Watts -- -- -- 16 16   Minutes -- -- -- 15 15   METs -- -- -- 2.64 2.63     NuStep   Level -- 3 4 4 4    Minutes -- 15 15 15 15    METs -- 1.8 2 2.1 2.5     Biostep-RELP   Level -- -- 1 -- --   Minutes -- -- 15 -- --   METs -- -- 2 -- --     Oxygen   Maintain Oxygen Saturation -- 88% or higher 88% or higher 88% or higher 88% or higher    Row Name 09/21/23 0900 09/22/23 1500           Response to Exercise   Blood Pressure (Admit) -- 102/60      Blood  Pressure (Exit) -- 102/58      Heart Rate (Admit) -- 73 bpm      Heart Rate (Exercise) -- 112 bpm      Heart Rate (Exit) -- 71 bpm      Rating of Perceived Exertion (Exercise) -- 14      Perceived Dyspnea (Exercise) -- 0      Symptoms -- none  Duration -- Progress to 30 minutes of  aerobic without signs/symptoms of physical distress      Intensity -- THRR unchanged        Progression   Progression -- Continue to progress workloads to maintain intensity without signs/symptoms of physical distress.      Average METs -- 2.2        Resistance Training   Training Prescription -- Yes      Weight -- 3 lb      Reps -- 10-15        Interval Training   Interval Training -- No        Treadmill   MPH -- 1.4      Grade -- 0      Minutes -- 15      METs -- 2.07        NuStep   Level -- 4      Minutes -- 15      METs -- 2.4        Home Exercise Plan   Plans to continue exercise at [employer]      Frequency Add 2 additional days to program exercise sessions. Add 2 additional days to program exercise sessions.      Initial Home Exercises Provided 09/21/23 09/21/23        Oxygen   Maintain Oxygen Saturation 88% or higher 88% or higher               Exercise Comments:   Exercise Comments     Row Name 07/15/23 1419           Exercise Comments First full day of exercise!  Patient was oriented to gym and equipment including functions, settings, policies, and procedures.  Patient's individual exercise prescription and treatment plan were reviewed.  All starting workloads were established based on the results of the 6 minute walk test done at initial orientation visit.  The plan for exercise progression was also introduced and progression will be customized based on patient's performance and goals.                Exercise Goals and Review:   Exercise Goals     Row Name 07/15/23 1440             Exercise Goals   Increase Physical Activity Yes        Intervention Provide advice, education, support and counseling about physical activity/exercise needs.;Develop an individualized exercise prescription for aerobic and resistive training based on initial evaluation findings, risk stratification, comorbidities and participant's personal goals.       Expected Outcomes Short Term: Attend rehab on a regular basis to increase amount of physical activity.;Long Term: Add in home exercise to make exercise part of routine and to increase amount of physical activity.;Long Term: Exercising regularly at least 3-5 days a week.       Increase Strength and Stamina Yes       Intervention Provide advice, education, support and counseling about physical activity/exercise needs.;Develop an individualized exercise prescription for aerobic and resistive training based on initial evaluation findings, risk stratification, comorbidities and participant's personal goals.       Expected Outcomes Short Term: Increase workloads from initial exercise prescription for resistance, speed, and METs.;Short Term: Perform resistance training exercises routinely during rehab and add in resistance training at home;Long Term: Improve cardiorespiratory fitness, muscular endurance and strength as measured by increased METs and functional capacity ( )       Able  to understand and use rate of perceived exertion (RPE) scale Yes       Intervention Provide education and explanation on how to use RPE scale       Expected Outcomes Short Term: Able to use RPE daily in rehab to express subjective intensity level;Long Term:  Able to use RPE to guide intensity level when exercising independently       Able to understand and use Dyspnea scale Yes       Intervention Provide education and explanation on how to use Dyspnea scale       Expected Outcomes Short Term: Able to use Dyspnea scale daily in rehab to express subjective sense of shortness of breath during exertion;Long Term: Able to use Dyspnea scale to  guide intensity level when exercising independently       Knowledge and understanding of Target Heart Rate Range (THRR) Yes       Intervention Provide education and explanation of THRR including how the numbers were predicted and where they are located for reference       Expected Outcomes Short Term: Able to state/look up THRR;Short Term: Able to use daily as guideline for intensity in rehab;Long Term: Able to use THRR to govern intensity when exercising independently       Able to check pulse independently Yes       Intervention Provide education and demonstration on how to check pulse in carotid and radial arteries.;Review the importance of being able to check your own pulse for safety during independent exercise       Expected Outcomes Short Term: Able to explain why pulse checking is important during independent exercise;Long Term: Able to check pulse independently and accurately       Understanding of Exercise Prescription Yes       Intervention Provide education, explanation, and written materials on patient's individual exercise prescription       Expected Outcomes Short Term: Able to explain program exercise prescription;Long Term: Able to explain home exercise prescription to exercise independently                Exercise Goals Re-Evaluation :  Exercise Goals Re-Evaluation     Row Name 07/21/23 1420 07/29/23 1333 08/10/23 1414 08/25/23 1104 09/09/23 0743     Exercise Goal Re-Evaluation   Exercise Goals Review Increase Physical Activity;Able to understand and use rate of perceived exertion (RPE) scale;Understanding of Exercise Prescription;Increase Strength and Stamina;Able to check pulse independently Increase Physical Activity;Increase Strength and Stamina;Understanding of Exercise Prescription Increase Physical Activity;Increase Strength and Stamina;Understanding of Exercise Prescription Increase Physical Activity;Increase Strength and Stamina;Understanding of Exercise Prescription  Increase Physical Activity;Increase Strength and Stamina;Understanding of Exercise Prescription   Comments Reviewed RPE  and dyspnea scale, THR and program prescription with pt today.  Pt voiced understanding and was given a copy of goals to take home. Stacie Mcdonald is off to a good start in the program. She was able to walk the treadmill at a speed of 1.4 mph with no incline during her first two sessions. She also improved from level 2 to level 3 on the T4 nustep. We will continue to monitor her progress in the program. Stacie Mcdonald is doing well in the program. She has continued to walk on the treadmill at a speed of 1.4 mph with no incline. She also improved to level 4 on the T4 nustep and began using the biostep at level 1. We will continue to monitor her progress in the program. Stacie Mcdonald is doing well in the  program. She has recently added the recumbent bike to her exercise routine at level 1 intensity. She also increased her treadmill speed from 1.4 mph to 1.5 mph. We will continue to monitor her progress in the program. Stacie Mcdonald continues to do well in rehab. She recently increased her speed on the treadmill to 1.6 mph with no incline. She also has continued to work at level 4 on the T4 nustep and level 1 on the recumbent bike. She has done well with 3 lb hand weights for resistance training as well. We will continue to monitor her progress in the program.   Expected Outcomes Short: Use RPE daily to regulate intensity.  Long: Follow program prescription in THR. Short: Continue to follow current exercise prescription. Long: Continue exercise to improve strength and stamina. Short: Begin to progressively increase treadmill workload. Long: Continue exercise to improve strength and stamina. Short: Begin to progressively increase treadmill workload. Long: Continue exercise to improve strength and stamina. Short: Try level 2 on the recumbent bike. Long: Continue to increase overall METs and stamina.    Row Name 09/21/23 0958  09/22/23 1551           Exercise Goal Re-Evaluation   Exercise Goals Review Able to check pulse independently;Knowledge and understanding of Target Heart Rate Range (THRR);Increase Strength and Stamina;Able to understand and use rate of perceived exertion (RPE) scale;Increase Physical Activity;Able to understand and use Dyspnea scale;Understanding of Exercise Prescription Increase Physical Activity;Increase Strength and Stamina;Understanding of Exercise Prescription      Comments Reviewed home exercise with pt today.  Pt plans to attend [employer] once a week while she still attends the program, but progress to more days after she graduates for exercise.  Reviewed THR, pulse, RPE, sign and symptoms, pulse oximetery and when to call 911 or MD.  Also discussed weather considerations and indoor options.  Pt voiced understanding. Stacie Mcdonald continues to do well in rehab. She continues to maintain her workload on the treadmill and T4 nustep. We will continue to encourage her to increase her workloads, and monitor her progress in the program.      Expected Outcomes Short: Add at least one day of Home exercise to current schedule, progress to 3 days post-graduation. Long: Continue to exercise to improve strength and stamina. Short: Increase workload on the treadmill, increase level on the T4 nustep to 5. Long: Continue to exercise to improve strength and stamina.               Discharge Exercise Prescription (Final Exercise Prescription Changes):  Exercise Prescription Changes - 09/22/23 1500       Response to Exercise   Blood Pressure (Admit) 102/60    Blood Pressure (Exit) 102/58    Heart Rate (Admit) 73 bpm    Heart Rate (Exercise) 112 bpm    Heart Rate (Exit) 71 bpm    Rating of Perceived Exertion (Exercise) 14    Perceived Dyspnea (Exercise) 0    Symptoms none    Duration Progress to 30 minutes of  aerobic without signs/symptoms of physical distress    Intensity THRR unchanged       Progression   Progression Continue to progress workloads to maintain intensity without signs/symptoms of physical distress.    Average METs 2.2      Resistance Training   Training Prescription Yes    Weight 3 lb    Reps 10-15      Interval Training   Interval Training No      Treadmill  MPH 1.4    Grade 0    Minutes 15    METs 2.07      NuStep   Level 4    Minutes 15    METs 2.4      Home Exercise Plan   Plans to continue exercise at [employer]    Frequency Add 2 additional days to program exercise sessions.    Initial Home Exercises Provided 09/21/23      Oxygen   Maintain Oxygen Saturation 88% or higher             Nutrition:  Target Goals: Understanding of nutrition guidelines, daily intake of sodium 1500mg , cholesterol 200mg , calories 30% from fat and 7% or less from saturated fats, daily to have 5 or more servings of fruits and vegetables.  Education: All About Nutrition: -Group instruction provided by verbal, written material, interactive activities, discussions, models, and posters to present general guidelines for heart healthy nutrition including fat, fiber, MyPlate, the role of sodium in heart healthy nutrition, utilization of the nutrition label, and utilization of this knowledge for meal planning. Follow up email sent as well. Written material given at graduation. Flowsheet Row Cardiac Rehab from 09/30/2023 in [facility] Cardiac and Pulmonary Rehab  Date 09/30/23  Educator JG  Instruction Review Code 1- Verbalizes Understanding       Biometrics:  Pre Biometrics - 07/15/23 1440       Pre Biometrics   Height 5' 3.5" (1.613 m)    Weight 181 lb 1.6 oz (82.1 kg)    Waist Circumference 40 inches    Hip Circumference 42.5 inches    Waist to Hip Ratio 0.94 %    BMI (Calculated) 31.57    Single Leg Stand 8.3 seconds              Nutrition Therapy Plan and Nutrition Goals:  Nutrition Therapy & Goals - 09/21/23 0937       Nutrition Therapy    RD appointment deferred Yes             Nutrition Assessments:  MEDIFICTS Score Key: >=70 Need to make dietary changes  40-70 Heart Healthy Diet <= 40 Therapeutic Level Cholesterol Diet  Flowsheet Row Cardiac Rehab from 07/21/2023 in [facility] Cardiac and Pulmonary Rehab  Picture Your Plate Total Score on Admission 54      Picture Your Plate Scores: <29 Unhealthy dietary pattern with much room for improvement. 41-50 Dietary pattern unlikely to meet recommendations for good health and room for improvement. 51-60 More healthful dietary pattern, with some room for improvement.  >60 Healthy dietary pattern, although there may be some specific behaviors that could be improved.    Nutrition Goals Re-Evaluation:  Nutrition Goals Re-Evaluation     Row Name 09/21/23 [phone]             Goals   Comment Pt has deferred to meet with the RD at this time.                Nutrition Goals Discharge (Final Nutrition Goals Re-Evaluation):  Nutrition Goals Re-Evaluation - 09/21/23 3086       Goals   Comment Pt has deferred to meet with the RD at this time.             Psychosocial: Target Goals: Acknowledge presence or absence of significant depression and/or stress, maximize coping skills, provide positive support system. Participant is able to verbalize types and ability to use techniques and skills needed for reducing stress and  depression.   Education: Stress, Anxiety, and Depression - Group verbal and visual presentation to define topics covered.  Reviews how body is impacted by stress, anxiety, and depression.  Also discusses healthy ways to reduce stress and to treat/manage anxiety and depression.  Written material given at graduation. Flowsheet Row Cardiac Rehab from 09/30/2023 in [facility] Cardiac and Pulmonary Rehab  Date 08/26/23  Educator SB  Instruction Review Code 1- [employer] Understanding       Education: Sleep Hygiene -Provides group verbal and written  instruction about how sleep can affect your health.  Define sleep hygiene, discuss sleep cycles and impact of sleep habits. Review good sleep hygiene tips.  Flowsheet Row Cardiac Rehab from 08/18/2018 in [facility] Cardiac and Pulmonary Rehab  Date 07/19/18  Educator [facility]  Instruction Review Code 1- Verbalizes Understanding       Initial Review & Psychosocial Screening:  Initial Psych Review & Screening - 07/13/23 1317       Initial Review   Current issues with None Identified      Family Dynamics   Good Support System? Yes   husband, son and daughter in law, other daughter     Barriers   Psychosocial barriers to participate in program There are no identifiable barriers or psychosocial needs.      Screening Interventions   Interventions To provide support and resources with identified psychosocial needs;Provide feedback about the scores to participant;Encouraged to exercise    Expected Outcomes Short Term goal: Utilizing psychosocial counselor, staff and physician to assist with identification of specific Stressors or current issues interfering with healing process. Setting desired goal for each stressor or current issue identified.;Long Term Goal: Stressors or current issues are controlled or eliminated.;Short Term goal: Identification and review with participant of any Quality of Life or Depression concerns found by scoring the questionnaire.;Long Term goal: The participant improves quality of Life and PHQ9 Scores as seen by post scores and/or verbalization of changes             Quality of Life Scores:   Quality of Life - 07/21/23 1443       Quality of Life   Select Quality of Life      Quality of Life Scores   Health/Function Pre 19 %    Socioeconomic Pre 25.57 %    Psych/Spiritual Pre 24 %    Family Pre 24 %    GLOBAL Pre 22.21 %            Scores of 19 and below usually indicate a poorer quality of life in these areas.  A difference of  2-3 points is a clinically  meaningful difference.  A difference of 2-3 points in the total score of the Quality of Life Index has been associated with significant improvement in overall quality of life, self-image, physical symptoms, and general health in studies assessing change in quality of life.  PHQ-9: Review Flowsheet  More data exists      09/21/2023 08/17/2023 07/15/2023 05/17/2023 01/29/2023  Depression screen PHQ 2/9  Decreased Interest 0 0 0 0 0  Down, Depressed, Hopeless 0 0 0 0 0  PHQ - 2 Score 0 0 0 0 0  Altered sleeping 0 - 0 1 0  Tired, decreased energy 0 - 2 1 0  Change in appetite 0 - 0 0 0  Feeling bad or failure about yourself  0 - 0 0 0  Trouble concentrating 0 - 0 0 0  Moving slowly or fidgety/restless 0 -  0 0 0  Suicidal thoughts 0 - 0 0 0  PHQ-9 Score 0 - 2 2 0  Difficult doing work/chores Not difficult at all - Not difficult at all Not difficult at all Not difficult at all    Details           Interpretation of Total Score  Total Score Depression Severity:  1-4 = Minimal depression, 5-9 = Mild depression, 10-14 = Moderate depression, 15-19 = Moderately severe depression, 20-27 = Severe depression   Psychosocial Evaluation and Intervention:  Psychosocial Evaluation - 07/13/23 1447       Psychosocial Evaluation & Interventions   Comments Stacie Mcdonald has no barriers to attending the program. She lives with her husband. He and her chidren and daughter in law are her support.  She is ready to start the program and work on her exercise progression to get back to her 3 x a week  group exercise class.    Expected Outcomes STG Attends all scheduled sessions and is able to work on her exercise progression to return to level prior to the PTCA. LTG Continue to progress with her exericse without symptoms    Continue Psychosocial Services  Follow up required by staff             Psychosocial Re-Evaluation:  Psychosocial Re-Evaluation     Row Name 09/21/23 0933             Psychosocial  Re-Evaluation   Current issues with None Identified;Current Sleep Concerns       Comments Stacie Mcdonald reports no major stressors at this time. She does report the election causing some stress. She states that she is sleeping well overall, but does wake up throughout the night. She also reports that she has a good support system made up by family. She enjoys gardening for stress relief.       Expected Outcomes Short: Continue to exercise and use stress management techniques Long: Maintain positive outlook.       Interventions Encouraged to attend Cardiac Rehabilitation for the exercise       Continue Psychosocial Services  Follow up required by staff                Psychosocial Discharge (Final Psychosocial Re-Evaluation):  Psychosocial Re-Evaluation - 09/21/23 0933       Psychosocial Re-Evaluation   Current issues with None Identified;Current Sleep Concerns    Comments Stacie Mcdonald reports no major stressors at this time. She does report the election causing some stress. She states that she is sleeping well overall, but does wake up throughout the night. She also reports that she has a good support system made up by family. She enjoys gardening for stress relief.    Expected Outcomes Short: Continue to exercise and use stress management techniques Long: Maintain positive outlook.    Interventions Encouraged to attend Cardiac Rehabilitation for the exercise    Continue Psychosocial Services  Follow up required by staff             Vocational Rehabilitation: Provide vocational rehab assistance to qualifying candidates.   Vocational Rehab Evaluation & Intervention:  Vocational Rehab - 07/13/23 1319       Initial Vocational Rehab Evaluation & Intervention   Assessment shows need for Vocational Rehabilitation No      Vocational Rehab Re-Evaulation   Comments retired             Education: Education Goals: Education classes will be provided on a variety of topics geared  toward better  understanding of heart health and risk factor modification. Participant will state understanding/return demonstration of topics presented as noted by education test scores.  Learning Barriers/Preferences:  Learning Barriers/Preferences - 07/13/23 1319       Learning Barriers/Preferences   Learning Barriers None    Learning Preferences None             General Cardiac Education Topics:  AED/CPR: - Group verbal and written instruction with the use of models to demonstrate the basic use of the AED with the basic ABC's of resuscitation. Flowsheet Row Cardiac Rehab from 08/18/2018 in [facility] Cardiac and Pulmonary Rehab  Date 06/02/18  Educator SB  Instruction Review Code 1- Verbalizes Understanding       Anatomy and Cardiac Procedures: - Group verbal and visual presentation and models provide information about basic cardiac anatomy and function. Reviews the testing methods done to diagnose heart disease and the outcomes of the test results. Describes the treatment choices: Medical Management, Angioplasty, or Coronary Bypass Surgery for treating various heart conditions including Myocardial Infarction, Angina, Valve Disease, and Cardiac Arrhythmias.  Written material given at graduation. Flowsheet Row Cardiac Rehab from 08/18/2018 in [facility] Cardiac and Pulmonary Rehab  Date 05/26/18  Educator SB  Instruction Review Code 5- Refused Teaching       Medication Safety: - Group verbal and visual instruction to review commonly prescribed medications for heart and lung disease. Reviews the medication, class of the drug, and side effects. Includes the steps to properly store meds and maintain the prescription regimen.  Written material given at graduation. Flowsheet Row Cardiac Rehab from 08/18/2018 in [facility] Cardiac and Pulmonary Rehab  Date 07/12/18  Educator SB  Instruction Review Code 5- Refused Teaching       Intimacy: - Group verbal instruction through game format to discuss how heart  and lung disease can affect sexual intimacy. Written material given at graduation.. Flowsheet Row Cardiac Rehab from 09/30/2023 in [facility] Cardiac and Pulmonary Rehab  Date 09/16/23  Educator MB  Instruction Review Code 1- Verbalizes Understanding       Know Your Numbers and Heart Failure: - Group verbal and visual instruction to discuss disease risk factors for cardiac and pulmonary disease and treatment options.  Reviews associated critical values for Overweight/Obesity, Hypertension, Cholesterol, and Diabetes.  Discusses basics of heart failure: signs/symptoms and treatments.  Introduces Heart Failure Zone chart for action plan for heart failure.  Written material given at graduation. Flowsheet Row Cardiac Rehab from 09/30/2023 in [facility] Cardiac and Pulmonary Rehab  Date 08/18/23  Educator SB  Instruction Review Code 1- Verbalizes Understanding       Infection Prevention: - Provides verbal and written material to individual with discussion of infection control including proper hand washing and proper equipment cleaning during exercise session. Flowsheet Row Cardiac Rehab from 09/30/2023 in [facility] Cardiac and Pulmonary Rehab  Date 07/15/23  Educator NT  Instruction Review Code 1- Verbalizes Understanding       Falls Prevention: - Provides verbal and written material to individual with discussion of falls prevention and safety. Flowsheet Row Cardiac Rehab from 09/30/2023 in [facility] Cardiac and Pulmonary Rehab  Date 07/13/23  Educator SB  Instruction Review Code 1- Verbalizes Understanding       Other: -Provides group and verbal instruction on various topics (see comments)   Knowledge Questionnaire Score:  Knowledge Questionnaire Score - 07/21/23 1441       Knowledge Questionnaire Score   Pre Score 26/26  Core Components/Risk Factors/Patient Goals at Admission:  Personal Goals and Risk Factors at Admission - 07/13/23 1320       Core Components/Risk  Factors/Patient Goals on Admission    Weight Management Yes;Weight Loss    Intervention Weight Management: Develop a combined nutrition and exercise program designed to reach desired caloric intake, while maintaining appropriate intake of nutrient and fiber, sodium and fats, and appropriate energy expenditure required for the weight goal.;Weight Management: Provide education and appropriate resources to help participant work on and attain dietary goals.    Admit Weight 179 lb (81.2 kg)    Goal Weight: Short Term 178 lb (80.7 kg)    Goal Weight: Long Term 169 lb (76.7 kg)    Expected Outcomes Short Term: Continue to assess and modify interventions until short term weight is achieved;Long Term: Adherence to nutrition and physical activity/exercise program aimed toward attainment of established weight goal    Improve shortness of breath with ADL's Yes    Intervention Provide education, individualized exercise plan and daily activity instruction to help decrease symptoms of SOB with activities of daily living.    Expected Outcomes Short Term: Improve cardiorespiratory fitness to achieve a reduction of symptoms when performing ADLs;Long Term: Be able to perform more ADLs without symptoms or delay the onset of symptoms    Diabetes Yes   A1C every 6 months   diet controlled   Intervention Provide education about signs/symptoms and action to take for hypo/hyperglycemia.;Provide education about proper nutrition, including hydration, and aerobic/resistive exercise prescription along with prescribed medications to achieve blood glucose in normal ranges: Fasting glucose 65-99 mg/dL    Expected Outcomes Short Term: Participant verbalizes understanding of the signs/symptoms and immediate care of hyper/hypoglycemia, proper foot care and importance of medication, aerobic/resistive exercise and nutrition plan for blood glucose control.;Long Term: Attainment of HbA1C < 7%.    Heart Failure Yes    Intervention Provide a  combined exercise and nutrition program that is supplemented with education, support and counseling about heart failure. Directed toward relieving symptoms such as shortness of breath, decreased exercise tolerance, and extremity edema.    Expected Outcomes Improve functional capacity of life;Short term: Attendance in program 2-3 days a week with increased exercise capacity. Reported lower sodium intake. Reported increased fruit and vegetable intake. Reports medication compliance.;Short term: Daily weights obtained and reported for increase. Utilizing diuretic protocols set by physician.;Long term: Adoption of self-care skills and reduction of barriers for early signs and symptoms recognition and intervention leading to self-care maintenance.    Hypertension Yes    Intervention Provide education on lifestyle modifcations including regular physical activity/exercise, weight management, moderate sodium restriction and increased consumption of fresh fruit, vegetables, and low fat dairy, alcohol moderation, and smoking cessation.;Monitor prescription use compliance.    Expected Outcomes Short Term: Continued assessment and intervention until BP is < 140/75mm HG in hypertensive participants. < 130/27mm HG in hypertensive participants with diabetes, heart failure or chronic kidney disease.;Long Term: Maintenance of blood pressure at goal levels.    Lipids Yes    Intervention Provide education and support for participant on nutrition & aerobic/resistive exercise along with prescribed medications to achieve LDL 70mg , HDL >40mg .    Expected Outcomes Short Term: Participant states understanding of desired cholesterol values and is compliant with medications prescribed. Participant is following exercise prescription and nutrition guidelines.;Long Term: Cholesterol controlled with medications as prescribed, with individualized exercise RX and with personalized nutrition plan. Value goals: LDL < 70mg , HDL > 40 mg.  Education:Diabetes - Individual verbal and written instruction to review signs/symptoms of diabetes, desired ranges of glucose level fasting, after meals and with exercise. Acknowledge that pre and post exercise glucose checks will be done for 3 sessions at entry of program.   Core Components/Risk Factors/Patient Goals Review:   Goals and Risk Factor Review     Row Name 09/21/23 0937             Core Components/Risk Factors/Patient Goals Review   Personal Goals Review Weight Management/Obesity;Diabetes;Hypertension       Review Stacie Mcdonald reports that she would still like to lose some weight. Her short term goal is to lose 5 lbs. Stacie Mcdonald has not been checking her sugars routinely, but states that her diabetes is well controlled at this time. Stacie Mcdonald states that she does not check her BP at home routinely, although she does have a BP cuff at home. Her BP has stayed within normal ranges at rehab.       Expected Outcomes Short: Work towards weight goal through diet and exercise. Long: Continue to manage lifestyle risk factors.                Core Components/Risk Factors/Patient Goals at Discharge (Final Review):   Goals and Risk Factor Review - 09/21/23 0937       Core Components/Risk Factors/Patient Goals Review   Personal Goals Review Weight Management/Obesity;Diabetes;Hypertension    Review Stacie Mcdonald reports that she would still like to lose some weight. Her short term goal is to lose 5 lbs. Stacie Mcdonald has not been checking her sugars routinely, but states that her diabetes is well controlled at this time. Stacie Mcdonald states that she does not check her BP at home routinely, although she does have a BP cuff at home. Her BP has stayed within normal ranges at rehab.    Expected Outcomes Short: Work towards weight goal through diet and exercise. Long: Continue to manage lifestyle risk factors.             ITP Comments:  ITP Comments     Row Name 07/13/23 1453 07/15/23 1426 07/21/23  1041 08/09/23 1629 08/18/23 1217   ITP Comments Virtual orientation call completed today. shehas an appointment on Date: 07/15/23  for EP eval and gym Orientation.  Documentation of diagnosis can be found in [facility] 06/25/2023 . Completed and gym orientation. Initial ITP created and sent for review to Dr. Bethann Punches, Medical Director. 30 Day review completed. Medical Director ITP review done, changes made as directed, and signed approval by Medical Director.     new to program Jazel called rehab today to let us know she went to ER at [facility] with angina and will be transported to [facility] for angioplasty. 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 Will place on medical hold until cardiac clearance received to resume rehab. 30 Day review completed. Medical Director ITP review done, changes made as directed, and signed approval by Medical Director.    Row Name 09/15/23 1307 10/06/23 1156         ITP Comments 30 Day review completed. Medical Director ITP review done, changes made as directed, and signed approval by Medical Director. 30 Day review completed. Medical Director ITP review done, changes made as directed, and signed approval by Medical Director.               Comments:

## 2023-10-07 ENCOUNTER — Encounter: Payer: Medicare Other | Admitting: *Deleted

## 2023-10-07 DIAGNOSIS — Z9861 Coronary angioplasty status: Secondary | ICD-10-CM

## 2023-10-07 NOTE — Progress Notes (Signed)
Daily Session Note  Patient Details  Name: Stacie Mcdonald MRN: [id number] Date of Birth: [birth year] Referring Provider:   Flowsheet Row Cardiac Rehab from 07/15/2023 in [facility] Cardiac and Pulmonary Rehab  Referring Provider Dr. Dorothyann Peng, MD       Encounter Date: 10/07/2023  Check In:  Session Check In - 10/07/23 0922       Check-In   Supervising physician immediately available to respond to emergencies See telemetry face sheet for immediately available ER MD    Location [facility]-Cardiac & Pulmonary Rehab    Staff Present Ronette Deter, BS, Exercise Physiologist;Joseph Megargel, RCP,RRT,BSRT;Maxon [place] BS, , Exercise Physiologist;Neena Beecham Katrinka Blazing, RN, ADN    Virtual Visit No    Medication changes reported     No    Fall or balance concerns reported    No    Warm-up and Cool-down Performed on first and last piece of equipment    Resistance Training Performed Yes    VAD Patient? No    PAD/SET Patient? No      Pain Assessment   Currently in Pain? No/denies                Social History   Tobacco Use  Smoking Status Never  Smokeless Tobacco Never    Goals Met:  Independence with exercise equipment Exercise tolerated well No report of concerns or symptoms today Strength training completed today  Goals Unmet:  Not Applicable  Comments: Pt able to follow exercise prescription today without complaint.  Will continue to monitor for progression.    Dr. Bethann Punches is Medical Director for [facility] Cardiac Rehabilitation.  Dr. Vida Rigger is Medical Director for [facility] Pulmonary Rehabilitation.

## 2023-10-12 ENCOUNTER — Encounter: Payer: Medicare Other | Admitting: *Deleted

## 2023-10-12 VITALS — Ht 63.5 in | Wt 177.3 lb

## 2023-10-12 DIAGNOSIS — Z9861 Coronary angioplasty status: Secondary | ICD-10-CM

## 2023-10-12 NOTE — Patient Instructions (Signed)
Discharge Patient Instructions  Patient Details  Name: Stacie Mcdonald MRN: [id number] Date of Birth: 12-16-1945 Referring Provider:  Sherlene Shams, MD   Number of Visits: 60  Reason for Discharge:  Patient reached a stable level of exercise. Patient independent in their exercise. Patient has met program and personal goals.  Diagnosis:  S/P PTCA (percutaneous transluminal coronary angioplasty)  Initial Exercise Prescription:  Initial Exercise Prescription - 07/15/23 1400       Date of Initial Exercise RX and Referring Provider   Date 07/15/23    Referring Provider Dr. Dorothyann Peng, MD      Oxygen   Maintain Oxygen Saturation 88% or higher      Treadmill   MPH 1.4    Grade 0    Minutes 15    METs 2.07      NuStep   Level 2    SPM 80    Minutes 15    METs 1.92      Biostep-RELP   Level 1    SPM 50    Minutes 15    METs 1.92      Prescription Details   Frequency (times per week) 3    Duration Progress to 30 minutes of continuous aerobic without signs/symptoms of physical distress      Intensity   THRR 40-80% of Max Heartrate 104-129    Ratings of Perceived Exertion 11-13    Perceived Dyspnea 0-4      Progression   Progression Continue to progress workloads to maintain intensity without signs/symptoms of physical distress.      Resistance Training   Training Prescription Yes    Weight 3 lb    Reps 10-15             Discharge Exercise Prescription (Final Exercise Prescription Changes):  Exercise Prescription Changes - 10/07/23 1600       Response to Exercise   Blood Pressure (Admit) 116/58    Blood Pressure (Exit) 104/58    Heart Rate (Admit) 83 bpm    Heart Rate (Exercise) 106 bpm    Heart Rate (Exit) 83 bpm    Rating of Perceived Exertion (Exercise) 15    Perceived Dyspnea (Exercise) 0    Symptoms none    Duration Progress to 30 minutes of  aerobic without signs/symptoms of physical distress    Intensity THRR unchanged       Progression   Progression Continue to progress workloads to maintain intensity without signs/symptoms of physical distress.    Average METs 2.12      Resistance Training   Training Prescription Yes    Weight 3 lb    Reps 10-15      Interval Training   Interval Training No      Treadmill   MPH 1.4    Grade 0    Minutes 15    METs 2.07      NuStep   Level 4    Minutes 15      Home Exercise Plan   Plans to continue exercise at [employer]    Frequency Add 2 additional days to program exercise sessions.    Initial Home Exercises Provided 09/21/23      Oxygen   Maintain Oxygen Saturation 88% or higher             Functional Capacity:  6 Minute Walk     Row Name 07/15/23 1438 10/12/23 0934       6 Minute Walk  Phase Initial Discharge    Distance 1055 feet 1295 feet    Distance % Change -- 22.7 %    Distance Feet Change -- 240 ft    Walk Time 6 minutes 6 minutes    # of Rest Breaks 0 0    MPH 1.99 2.45    METS 1.92 2.51    RPE 15 15    Perceived Dyspnea  2 3    VO2 Peak 6.72 8.78    Symptoms Yes (comment) Yes (comment)    Comments SOB, leg weakness SOB    Resting HR 80 bpm 80 bpm    Resting BP 124/68 112/60    Resting Oxygen Saturation  93 % 95 %    Exercise Oxygen Saturation  during 6 min walk 93 % 93 %    Max Ex. HR 108 bpm 116 bpm    Max Ex. BP 142/74 148/70    2 Minute Post BP 120/68 --            Nutrition & Weight - Outcomes:  Pre Biometrics - 07/15/23 1440       Pre Biometrics   Height 5' 3.5" (1.613 m)    Weight 181 lb 1.6 oz (82.1 kg)    Waist Circumference 40 inches    Hip Circumference 42.5 inches    Waist to Hip Ratio 0.94 %    BMI (Calculated) 31.57    Single Leg Stand 8.3 seconds             Post Biometrics - 10/12/23 0938        Post  Biometrics   Height 5' 3.5" (1.613 m)    Weight 177 lb 4.8 oz (80.4 kg)    Waist Circumference 39.5 inches    Hip Circumference 41 inches    Waist to Hip Ratio 0.96 %    BMI  (Calculated) 30.91    Single Leg Stand 11.7 seconds            Nutrition:  Nutrition Therapy & Goals - 09/21/23 0937       Nutrition Therapy   RD appointment deferred Yes

## 2023-10-12 NOTE — Progress Notes (Signed)
Daily Session Note  Patient Details  Name: Stacie Mcdonald MRN: [id number] Date of Birth: 01/13/1946 Referring Provider:   Flowsheet Row Cardiac Rehab from 07/15/2023 in [facility] Cardiac and Pulmonary Rehab  Referring Provider Dr. Dorothyann Peng, MD       Encounter Date: 10/12/2023  Check In:  Session Check In - 10/12/23 0937       Check-In   Supervising physician immediately available to respond to emergencies See telemetry face sheet for immediately available ER MD    Location [facility]-Cardiac & Pulmonary Rehab    Staff Present Rory Percy, MS, Exercise Physiologist;Maxon Suzzette Righter, , Exercise Physiologist;Meredith Jewel Baize, RN BSN;Juelz Whittenberg, RN, BSN, CCRP;Noah Tickle, BS, Exercise Physiologist    Virtual Visit No    Medication changes reported     No    Fall or balance concerns reported    No    Warm-up and Cool-down Performed on first and last piece of equipment    Resistance Training Performed Yes    VAD Patient? No    PAD/SET Patient? No      Pain Assessment   Currently in Pain? No/denies                Social History   Tobacco Use  Smoking Status Never  Smokeless Tobacco Never    Goals Met:  Independence with exercise equipment Exercise tolerated well No report of concerns or symptoms today  Goals Unmet:  Not Applicable  Comments: Pt able to follow exercise prescription today without complaint.  Will continue to monitor for progression.    Dr. Bethann Punches is Medical Director for [facility] Cardiac Rehabilitation.  Dr. Vida Rigger is Medical Director for [facility] Pulmonary Rehabilitation.

## 2023-10-19 ENCOUNTER — Encounter: Payer: Medicare Other | Attending: Internal Medicine | Admitting: *Deleted

## 2023-10-19 DIAGNOSIS — Z9861 Coronary angioplasty status: Secondary | ICD-10-CM | POA: Insufficient documentation

## 2023-10-19 NOTE — Progress Notes (Signed)
Daily Session Note  Patient Details  Name: Stacie Mcdonald MRN: [id number] Date of Birth: Jan 15, 1946 Referring Provider:   Flowsheet Row Cardiac Rehab from 07/15/2023 in [facility] Cardiac and Pulmonary Rehab  Referring Provider Dr. Dorothyann Peng, MD       Encounter Date: 10/19/2023  Check In:  Session Check In - 10/19/23 0931       Check-In   Supervising physician immediately available to respond to emergencies See telemetry face sheet for immediately available ER MD    Location [facility]-Cardiac & Pulmonary Rehab    Staff Present Rory Percy, MS, Exercise Physiologist;Jathniel Smeltzer, RN, BSN, CCRP;Noah Tickle, BS, Exercise Physiologist;Maxon Conetta BS, , Exercise Physiologist    Virtual Visit No    Medication changes reported     No    Fall or balance concerns reported    No    Warm-up and Cool-down Performed on first and last piece of equipment    Resistance Training Performed Yes    VAD Patient? No    PAD/SET Patient? No      Pain Assessment   Currently in Pain? No/denies                Social History   Tobacco Use  Smoking Status Never  Smokeless Tobacco Never    Goals Met:  Independence with exercise equipment Exercise tolerated well No report of concerns or symptoms today  Goals Unmet:  Not Applicable  Comments: Pt able to follow exercise prescription today without complaint.  Will continue to monitor for progression.    Dr. Bethann Punches is Medical Director for [facility] Cardiac Rehabilitation.  Dr. Vida Rigger is Medical Director for [facility] Pulmonary Rehabilitation.

## 2023-10-21 DIAGNOSIS — Z9861 Coronary angioplasty status: Secondary | ICD-10-CM | POA: Diagnosis not present

## 2023-10-21 NOTE — Progress Notes (Signed)
Daily Session Note  Patient Details  Name: Stacie Mcdonald MRN: [id number] Date of Birth: 01-Apr-1946 Referring Provider:   Flowsheet Row Cardiac Rehab from 07/15/2023 in [facility] Cardiac and Pulmonary Rehab  Referring Provider Dr. Dorothyann Peng, MD       Encounter Date: 10/21/2023  Check In:  Session Check In - 10/21/23 0959       Check-In   Supervising physician immediately available to respond to emergencies See telemetry face sheet for immediately available ER MD    Location [facility]-Cardiac & Pulmonary Rehab    Staff Present Cora Collum, RN, BSN, CCRP;Margaret Best, MS, Exercise Physiologist;Noah Tickle, BS, Exercise Physiologist;Joseph Reino [place], [place]    Virtual Visit No    Medication changes reported     No    Fall or balance concerns reported    No    Warm-up and Cool-down Performed on first and last piece of equipment    Resistance Training Performed Yes    VAD Patient? No    PAD/SET Patient? No      Pain Assessment   Currently in Pain? No/denies                Social History   Tobacco Use  Smoking Status Never  Smokeless Tobacco Never    Goals Met:  Independence with exercise equipment Exercise tolerated well No report of concerns or symptoms today  Goals Unmet:  Not Applicable  Comments: Pt able to follow exercise prescription today without complaint.  Will continue to monitor for progression.    Dr. Bethann Punches is Medical Director for [facility] Cardiac Rehabilitation.  Dr. Vida Rigger is Medical Director for [facility] Pulmonary Rehabilitation.

## 2023-10-22 ENCOUNTER — Encounter: Payer: Medicare Other | Admitting: *Deleted

## 2023-10-22 DIAGNOSIS — Z9861 Coronary angioplasty status: Secondary | ICD-10-CM

## 2023-10-22 NOTE — Progress Notes (Signed)
Daily Session Note  Patient Details  Name: Stacie Mcdonald MRN: [id number] Date of Birth: March 15, 1946 Referring Provider:   Flowsheet Row Cardiac Rehab from 07/15/2023 in [facility] Cardiac and Pulmonary Rehab  Referring Provider Dr. Dorothyann Peng, MD       Encounter Date: 10/22/2023  Check In:  Session Check In - 10/22/23 0946       Check-In   Supervising physician immediately available to respond to emergencies See telemetry face sheet for immediately available ER MD    Location [facility]-Cardiac & Pulmonary Rehab    Staff Present Elige Ko, RCP,RRT,BSRT;Noah Tickle, BS, Exercise Physiologist;Bushra Denman, RN, BSN, CCRP    Virtual Visit No    Medication changes reported     No    Fall or balance concerns reported    No    Warm-up and Cool-down Performed on first and last piece of equipment    Resistance Training Performed Yes    VAD Patient? No    PAD/SET Patient? No      Pain Assessment   Currently in Pain? No/denies                Social History   Tobacco Use  Smoking Status Never  Smokeless Tobacco Never    Goals Met:  Independence with exercise equipment Exercise tolerated well No report of concerns or symptoms today  Goals Unmet:  Not Applicable  Comments: Pt able to follow exercise prescription today without complaint.  Will continue to monitor for progression.    Dr. Bethann Punches is Medical Director for [facility] Cardiac Rehabilitation.  Dr. Vida Rigger is Medical Director for [facility] Pulmonary Rehabilitation.

## 2023-10-26 ENCOUNTER — Encounter: Payer: Medicare Other | Admitting: *Deleted

## 2023-10-26 DIAGNOSIS — Z9861 Coronary angioplasty status: Secondary | ICD-10-CM

## 2023-10-26 NOTE — Progress Notes (Signed)
Daily Session Note  Patient Details  Name: Melena T [place] MRN: [id number] Date of Birth: 1945/11/17 Referring Provider:   Flowsheet Row Cardiac Rehab from 07/15/2023 in [facility] Cardiac and Pulmonary Rehab  Referring Provider Dr. Dorothyann Peng, MD       Encounter Date: 10/26/2023  Check In:  Session Check In - 10/26/23 0940       Check-In   Supervising physician immediately available to respond to emergencies See telemetry face sheet for immediately available ER MD    Location [facility]-Cardiac & Pulmonary Rehab    Staff Present Cora Collum, RN, BSN, CCRP;Margaret Best, MS, Exercise Physiologist;Noah Tickle, BS, Exercise Physiologist;Maxon Conetta BS, , Exercise Physiologist    Virtual Visit No    Medication changes reported     No    Fall or balance concerns reported    No    Warm-up and Cool-down Performed on first and last piece of equipment    Resistance Training Performed Yes    VAD Patient? No    PAD/SET Patient? No      Pain Assessment   Currently in Pain? No/denies                Social History   Tobacco Use  Smoking Status Never  Smokeless Tobacco Never    Goals Met:  Independence with exercise equipment Exercise tolerated well No report of concerns or symptoms today  Goals Unmet:  Not Applicable  Comments: Pt able to follow exercise prescription today without complaint.  Will continue to monitor for progression.    Dr. Bethann Punches is Medical Director for [facility] Cardiac Rehabilitation.  Dr. Vida Rigger is Medical Director for [facility] Pulmonary Rehabilitation.

## 2023-11-03 ENCOUNTER — Encounter: Payer: Self-pay | Admitting: *Deleted

## 2023-11-03 DIAGNOSIS — Z9861 Coronary angioplasty status: Secondary | ICD-10-CM

## 2023-11-03 NOTE — Progress Notes (Signed)
Cardiac Individual Treatment Plan  Patient Details  Name: Stacie Mcdonald MRN: [id number] Date of Birth: May 05, 1946 Referring Provider:   Flowsheet Row Cardiac Rehab from 07/15/2023 in [facility] Cardiac and Pulmonary Rehab  Referring Provider Dr. Dorothyann Peng, MD       Initial Encounter Date:  Flowsheet Row Cardiac Rehab from 07/15/2023 in [facility] Cardiac and Pulmonary Rehab  Date 07/15/23       Visit Diagnosis: S/P PTCA (percutaneous transluminal coronary angioplasty)  Patient's Home Medications on Admission:  Current Outpatient Medications:    albuterol (VENTOLIN HFA) 108 (90 Base) MCG/ACT inhaler, Inhale 2 puffs into the lungs every 6 (six) hours as needed. Using in Am daily  per MD, Disp: , Rfl:    aspirin 81 MG tablet, Take 81 mg by mouth daily., Disp: , Rfl:    clopidogrel (PLAVIX) 75 MG tablet, TAKE ONE TABLET BY MOUTH DAILY (Patient taking differently: Take 75 mg by mouth daily.), Disp: 90 tablet, Rfl: 1   D-MANNOSE PO, Take 800 mg by mouth daily., Disp: , Rfl:    ELDERBERRY PO, Take 1,000 mg by mouth daily., Disp: , Rfl:    empagliflozin (JARDIANCE) 10 MG TABS tablet, Take 1 tablet (10 mg total) by mouth daily before breakfast., Disp: 90 tablet, Rfl: 2   fluconazole (DIFLUCAN) 150 MG tablet, Take 1 tablet (150 mg total) by mouth daily., Disp: 1 tablet, Rfl: 0   furosemide (LASIX) 40 MG tablet, Take 40 mg by mouth daily., Disp: , Rfl:    GLUCOSAMINE-CHONDROITIN PO, Take 2 tablets by mouth daily., Disp: , Rfl:    isosorbide mononitrate (IMDUR) 120 MG 24 hr tablet, Take 120 mg by mouth daily. , Disp: , Rfl:    loratadine (CLARITIN) 10 MG tablet, Take 10 mg by mouth daily as needed for allergies., Disp: , Rfl:    metoprolol (TOPROL-XL) 200 MG 24 hr tablet, TAKE 1 TABLET BY MOUTH DAILY., Disp: 90 tablet, Rfl: 1   nitroGLYCERIN (NITROSTAT) 0.4 MG SL tablet, Place 0.4 mg under the tongue every 5 (five) minutes as needed for chest pain., Disp: , Rfl:    pantoprazole (PROTONIX) 40 MG  tablet, Take 1 tablet (40 mg total) by mouth daily., Disp: 90 tablet, Rfl: 1   PRALUENT 75 MG/ML SOAJ, Inject 75 mg into the muscle every 14 (fourteen) days., Disp: , Rfl:    ranolazine (RANEXA) 500 MG 12 hr tablet, Take 500 mg by mouth 2 (two) times daily., Disp: , Rfl:    sacubitril-valsartan (ENTRESTO) 49-51 MG, Take 1 tablet by mouth 2 (two) times daily., Disp: , Rfl:    spironolactone (ALDACTONE) 25 MG tablet, Take 1 tablet (25 mg total) by mouth daily., Disp: , Rfl:    sulfamethoxazole-trimethoprim (BACTRIM DS) 800-160 MG tablet, Take 1 tablet by mouth 2 (two) times daily., Disp: 10 tablet, Rfl: 0   traZODone (DESYREL) 150 MG tablet, Take 1 tablet (150 mg total) by mouth at bedtime., Disp: 90 tablet, Rfl: 0  Past Medical History: Past Medical History:  Diagnosis Date   Allergy    Arthritis    fingers, knees   Asthma    Breast Cancer 1991   R mastectomy. 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   Sleep apnea    CPAP   Stable angina (HCC) 07/27/2019   Thoracic vertebral fracture (HCC) 04/05/2012   Urinary tract infection    Wears contact lenses    left only    Tobacco Use: Social History   Tobacco Use  Smoking Status Never  Smokeless Tobacco Never    Labs: Review Flowsheet  More data exists      Latest Ref Rng & Units 05/06/2022 11/23/2022 08/09/2023 08/17/2023 09/08/2023  Labs for ITP  Cardiac and Pulmonary Rehab  Cholestrol 0 - 200 mg/dL 865  784  - 696  295   LDL (calc) 0 - 99 mg/dL 58  46  - 47  41   Direct LDL mg/dL 28.4  13.2  - 44.0  10.2   HDL-C >39.00 mg/dL 72.53  66.44  - 03.47  34.10   Trlycerides 0.0 - 149.0 mg/dL 425.9  563.8  - 756.4  189.0   Hemoglobin A1c - 6.6  6.3  6.0     - -    Details       This result is from an external source.          Exercise Target Goals: Exercise Program Goal: Individual exercise prescription set using results from initial 6 min walk test and THRR while considering  patient's activity barriers and safety.   Exercise Prescription Goal: Initial exercise prescription builds to 30-45 minutes a day of aerobic activity, 2-3 days per week.  Home exercise guidelines will be given to patient during program as part of exercise prescription that the participant will acknowledge.   Education: Aerobic Exercise: - Group verbal and visual presentation on the components of exercise prescription. Introduces F.I.T.T principle from ACSM for exercise prescriptions.  Reviews F.I.T.T. principles of aerobic exercise including progression. Written material given at graduation. Flowsheet Row Cardiac Rehab from 10/07/2023 in [facility] Cardiac and Pulmonary Rehab  Date 09/16/23  Educator MB  Instruction Review Code 1- Verbalizes Understanding       Education: Resistance Exercise: - Group verbal and visual presentation on the components of exercise prescription. Introduces F.I.T.T principle from ACSM for exercise prescriptions  Reviews F.I.T.T. principles of resistance exercise including progression. Written material given at graduation.    Education: Exercise & Equipment Safety: - Individual verbal instruction and demonstration of equipment use and safety with use of the equipment. Flowsheet Row Cardiac Rehab from 10/07/2023 in [facility] Cardiac and Pulmonary Rehab  Date 07/15/23  Educator NT  Instruction Review Code 1- Verbalizes Understanding        Education: Exercise Physiology & General Exercise Guidelines: - Group verbal and written instruction with models to review the exercise physiology of the cardiovascular system and associated critical values. Provides general exercise guidelines with specific guidelines to those with heart or lung disease.  Flowsheet Row Cardiac Rehab from 10/07/2023 in [facility] Cardiac and Pulmonary Rehab  Date 09/02/23  Educator MB  Instruction Review Code 1- [employer] Understanding       Education: Flexibility, Balance, Mind/Body Relaxation: - Group verbal and visual presentation with interactive activity on the components of exercise  prescription. Introduces F.I.T.T principle from ACSM for exercise prescriptions. Reviews F.I.T.T. principles of flexibility and balance exercise training including progression. Also discusses the mind body connection.  Reviews various relaxation techniques to help reduce and manage stress (i.e. Deep breathing, progressive muscle relaxation, and visualization). Balance handout provided to take home. Written material given at graduation. Flowsheet Row Cardiac Rehab from 08/18/2018 in [facility] Cardiac and Pulmonary Rehab  Date 06/28/18  Educator AS  Instruction Review Code 5- Refused Teaching       Activity Barriers & Risk Stratification:  Activity Barriers & Cardiac Risk Stratification - 07/15/23 1439       Activity Barriers & Cardiac Risk Stratification   Activity Barriers Shortness of Breath;Chest Pain/Angina;Deconditioning;Muscular Weakness    Cardiac Risk Stratification High             6 Minute Walk:  6 Minute Walk     Row Name 07/15/23 1438 10/12/23 0934       6 Minute Walk   Phase Initial Discharge    Distance 1055 feet 1295 feet    Distance % Change -- 22.7 %    Distance Feet Change -- 240 ft    Walk Time 6 minutes 6 minutes    # of Rest Breaks 0 0    MPH 1.99 2.45    METS 1.92 2.51    RPE 15 15    Perceived Dyspnea  2 3    VO2 Peak 6.72  8.78    Symptoms Yes (comment) Yes (comment)    Comments SOB, leg weakness SOB    Resting HR 80 bpm 80 bpm    Resting BP 124/68 112/60    Resting Oxygen Saturation  93 % 95 %    Exercise Oxygen Saturation  during 6 min walk 93 % 93 %    Max Ex. HR 108 bpm 116 bpm    Max Ex. BP 142/74 148/70    2 Minute Post BP 120/68 --             Oxygen Initial Assessment:   Oxygen Re-Evaluation:   Oxygen Discharge (Final Oxygen Re-Evaluation):   Initial Exercise Prescription:  Initial Exercise Prescription - 07/15/23 1400       Date of Initial Exercise RX and Referring Provider   Date 07/15/23    Referring Provider Dr. Dorothyann Peng, MD      Oxygen   Maintain Oxygen Saturation 88% or higher      Treadmill   MPH 1.4    Grade 0    Minutes 15    METs 2.07      NuStep   Level 2    SPM 80    Minutes 15    METs 1.92      Biostep-RELP   Level 1    SPM 50    Minutes 15    METs 1.92      Prescription Details   Frequency (times per week) 3    Duration Progress to 30 minutes of continuous aerobic without signs/symptoms of physical distress      Intensity   THRR 40-80% of Max Heartrate 104-129    Ratings of Perceived Exertion 11-13    Perceived Dyspnea 0-4      Progression   Progression Continue to progress workloads to maintain intensity without signs/symptoms of physical distress.      Resistance Training   Training Prescription Yes    Weight 3 lb    Reps 10-15  Perform Capillary Blood Glucose checks as needed.  Exercise Prescription Changes:   Exercise Prescription Changes     Row Name 07/15/23 1400 07/29/23 1300 08/10/23 1400 08/25/23 1100 09/09/23 0700     Response to Exercise   Blood Pressure (Admit) 124/68 102/58 124/62 112/62 102/60   Blood Pressure (Exercise) 142/74 134/64 126/66 132/66 142/70   Blood Pressure (Exit) 120/68 106/60 130/70 100/56 102/58   Heart Rate (Admit) 80 bpm 93 bpm 84 bpm 89 bpm 96 bpm   Heart Rate (Exercise)  108 bpm 107 bpm 112 bpm 115 bpm 102 bpm   Heart Rate (Exit) 87 bpm 89 bpm 84 bpm 88 bpm 78 bpm   Oxygen Saturation (Admit) 93 % -- -- -- --   Oxygen Saturation (Exercise) 93 % -- -- -- --   Rating of Perceived Exertion (Exercise) 15 14 15 15 15    Perceived Dyspnea (Exercise) 2 -- -- 0 --   Symptoms SOB, leg weakness none none none none   Comments Results First two days of exercise -- -- --   Duration -- Progress to 30 minutes of  aerobic without signs/symptoms of physical distress Progress to 30 minutes of  aerobic without signs/symptoms of physical distress Progress to 30 minutes of  aerobic without signs/symptoms of physical distress Progress to 30 minutes of  aerobic without signs/symptoms of physical distress   Intensity -- THRR unchanged THRR unchanged THRR unchanged THRR unchanged     Progression   Progression -- Continue to progress workloads to maintain intensity without signs/symptoms of physical distress. Continue to progress workloads to maintain intensity without signs/symptoms of physical distress. Continue to progress workloads to maintain intensity without signs/symptoms of physical distress. Continue to progress workloads to maintain intensity without signs/symptoms of physical distress.   Average METs -- 2.04 2.04 2.17 2.27     Resistance Training   Training Prescription -- Yes Yes Yes Yes   Weight -- 3 lb 3 lb 3 lb 3 lb   Reps -- 10-15 10-15 10-15 10-15     Interval Training   Interval Training -- No No No No     Treadmill   MPH -- 1.4 1.4 1.5 1.6   Grade -- 0 0 0 0   Minutes -- 15 15 15 15    METs -- 2.07 2.07 2.15 2.23     Recumbant Bike   Level -- -- -- 1 1   Watts -- -- -- 16 16   Minutes -- -- -- 15 15   METs -- -- -- 2.64 2.63     NuStep   Level -- 3 4 4 4    Minutes -- 15 15 15 15    METs -- 1.8 2 2.1 2.5     Biostep-RELP   Level -- -- 1 -- --   Minutes -- -- 15 -- --   METs -- -- 2 -- --     Oxygen   Maintain Oxygen Saturation -- 88% or higher  88% or higher 88% or higher 88% or higher    Row Name 09/21/23 0900 09/22/23 1500 10/07/23 1600 10/18/23 1100       Response to Exercise   Blood Pressure (Admit) -- 102/60 116/58 112/60    Blood Pressure (Exit) -- 102/58 104/58 110/62    Heart Rate (Admit) -- 73 bpm 83 bpm 80 bpm    Heart Rate (Exercise) -- 112 bpm 106 bpm 119 bpm    Heart Rate (Exit) -- 71 bpm 83 bpm 84 bpm  Oxygen Saturation (Admit) -- -- -- 96 %    Oxygen Saturation (Exercise) -- -- -- 90 %    Oxygen Saturation (Exit) -- -- -- 90 %    Rating of Perceived Exertion (Exercise) -- 14 15 15     Perceived Dyspnea (Exercise) -- 0 0 0    Symptoms -- none none none    Duration -- Progress to 30 minutes of  aerobic without signs/symptoms of physical distress Progress to 30 minutes of  aerobic without signs/symptoms of physical distress Progress to 30 minutes of  aerobic without signs/symptoms of physical distress    Intensity -- THRR unchanged THRR unchanged THRR unchanged      Progression   Progression -- Continue to progress workloads to maintain intensity without signs/symptoms of physical distress. Continue to progress workloads to maintain intensity without signs/symptoms of physical distress. Continue to progress workloads to maintain intensity without signs/symptoms of physical distress.    Average METs -- 2.2 2.12 2.11      Resistance Training   Training Prescription -- Yes Yes Yes    Weight -- 3 lb 3 lb 3 lb    Reps -- 10-15 10-15 10-15      Interval Training   Interval Training -- No No No      Treadmill   MPH -- 1.4 1.4 1.4    Grade -- 0 0 0    Minutes -- 15 15 15     METs -- 2.07 2.07 2.07      NuStep   Level -- 4 4 4     Minutes -- 15 15 15     METs -- 2.4 -- 2.3      Home Exercise Plan   Plans to continue exercise at [place] [employer]    Frequency Add 2 additional days to program exercise sessions. Add 2 additional days to program exercise sessions. Add 2 additional days  to program exercise sessions. Add 2 additional days to program exercise sessions.    Initial Home Exercises Provided 09/21/23 09/21/23 09/21/23 09/21/23      Oxygen   Maintain Oxygen Saturation 88% or higher 88% or higher 88% or higher 88% or higher             Exercise Comments:   Exercise Comments     Row Name 07/15/23 1419           Exercise Comments First full day of exercise!  Patient was oriented to gym and equipment including functions, settings, policies, and procedures.  Patient's individual exercise prescription and treatment plan were reviewed.  All starting workloads were established based on the results of the 6 minute walk test done at initial orientation visit.  The plan for exercise progression was also introduced and progression will be customized based on patient's performance and goals.                Exercise Goals and Review:   Exercise Goals     Row Name 07/15/23 1440             Exercise Goals   Increase Physical Activity Yes       Intervention Provide advice, education, support and counseling about physical activity/exercise needs.;Develop an individualized exercise prescription for aerobic and resistive training based on initial evaluation findings, risk stratification, comorbidities and participant's personal goals.       Expected Outcomes Short Term: Attend rehab on a regular basis to increase amount of physical activity.;Long Term: Add in home exercise  to make exercise part of routine and to increase amount of physical activity.;Long Term: Exercising regularly at least 3-5 days a week.       Increase Strength and Stamina Yes       Intervention Provide advice, education, support and counseling about physical activity/exercise needs.;Develop an individualized exercise prescription for aerobic and resistive training based on initial evaluation findings, risk stratification, comorbidities and participant's personal goals.       Expected Outcomes  Short Term: Increase workloads from initial exercise prescription for resistance, speed, and METs.;Short Term: Perform resistance training exercises routinely during rehab and add in resistance training at home;Long Term: Improve cardiorespiratory fitness, muscular endurance and strength as measured by increased METs and functional capacity ( )       Able to understand and use rate of perceived exertion (RPE) scale Yes       Intervention Provide education and explanation on how to use RPE scale       Expected Outcomes Short Term: Able to use RPE daily in rehab to express subjective intensity level;Long Term:  Able to use RPE to guide intensity level when exercising independently       Able to understand and use Dyspnea scale Yes       Intervention Provide education and explanation on how to use Dyspnea scale       Expected Outcomes Short Term: Able to use Dyspnea scale daily in rehab to express subjective sense of shortness of breath during exertion;Long Term: Able to use Dyspnea scale to guide intensity level when exercising independently       Knowledge and understanding of Target Heart Rate Range (THRR) Yes       Intervention Provide education and explanation of THRR including how the numbers were predicted and where they are located for reference       Expected Outcomes Short Term: Able to state/look up THRR;Short Term: Able to use daily as guideline for intensity in rehab;Long Term: Able to use THRR to govern intensity when exercising independently       Able to check pulse independently Yes       Intervention Provide education and demonstration on how to check pulse in carotid and radial arteries.;Review the importance of being able to check your own pulse for safety during independent exercise       Expected Outcomes Short Term: Able to explain why pulse checking is important during independent exercise;Long Term: Able to check pulse independently and accurately       Understanding of Exercise  Prescription Yes       Intervention Provide education, explanation, and written materials on patient's individual exercise prescription       Expected Outcomes Short Term: Able to explain program exercise prescription;Long Term: Able to explain home exercise prescription to exercise independently                Exercise Goals Re-Evaluation :  Exercise Goals Re-Evaluation     Row Name 07/21/23 1420 07/29/23 1333 08/10/23 1414 08/25/23 1104 09/09/23 0743     Exercise Goal Re-Evaluation   Exercise Goals Review Increase Physical Activity;Able to understand and use rate of perceived exertion (RPE) scale;Understanding of Exercise Prescription;Increase Strength and Stamina;Able to check pulse independently Increase Physical Activity;Increase Strength and Stamina;Understanding of Exercise Prescription Increase Physical Activity;Increase Strength and Stamina;Understanding of Exercise Prescription Increase Physical Activity;Increase Strength and Stamina;Understanding of Exercise Prescription Increase Physical Activity;Increase Strength and Stamina;Understanding of Exercise Prescription   Comments Reviewed RPE  and dyspnea scale, THR and  program prescription with pt today.  Pt voiced understanding and was given a copy of goals to take home. Khadidja is off to a good start in the program. She was able to walk the treadmill at a speed of 1.4 mph with no incline during her first two sessions. She also improved from level 2 to level 3 on the T4 nustep. We will continue to monitor her progress in the program. Mati is doing well in the program. She has continued to walk on the treadmill at a speed of 1.4 mph with no incline. She also improved to level 4 on the T4 nustep and began using the biostep at level 1. We will continue to monitor her progress in the program. Youstina is doing well in the program. She has recently added the recumbent bike to her exercise routine at level 1 intensity. She also increased her  treadmill speed from 1.4 mph to 1.5 mph. We will continue to monitor her progress in the program. Loida continues to do well in rehab. She recently increased her speed on the treadmill to 1.6 mph with no incline. She also has continued to work at level 4 on the T4 nustep and level 1 on the recumbent bike. She has done well with 3 lb hand weights for resistance training as well. We will continue to monitor her progress in the program.   Expected Outcomes Short: Use RPE daily to regulate intensity.  Long: Follow program prescription in THR. Short: Continue to follow current exercise prescription. Long: Continue exercise to improve strength and stamina. Short: Begin to progressively increase treadmill workload. Long: Continue exercise to improve strength and stamina. Short: Begin to progressively increase treadmill workload. Long: Continue exercise to improve strength and stamina. Short: Try level 2 on the recumbent bike. Long: Continue to increase overall METs and stamina.    Row Name 09/21/23 1027 09/22/23 1551 10/07/23 1611 10/18/23 1111 10/19/23 0926     Exercise Goal Re-Evaluation   Exercise Goals Review Able to check pulse independently;Knowledge and understanding of Target Heart Rate Range (THRR);Increase Strength and Stamina;Able to understand and use rate of perceived exertion (RPE) scale;Increase Physical Activity;Able to understand and use Dyspnea scale;Understanding of Exercise Prescription Increase Physical Activity;Increase Strength and Stamina;Understanding of Exercise Prescription Increase Physical Activity;Increase Strength and Stamina;Understanding of Exercise Prescription Increase Physical Activity;Increase Strength and Stamina;Understanding of Exercise Prescription Increase Physical Activity;Increase Strength and Stamina;Understanding of Exercise Prescription   Comments Reviewed home exercise with pt today.  Pt plans to attend [employer] once a week while she still attends the program, but  progress to more days after she graduates for exercise.  Reviewed THR, pulse, RPE, sign and symptoms, pulse oximetery and when to call 911 or MD.  Also discussed weather considerations and indoor options.  Pt voiced understanding. Tosheba continues to do well in rehab. She continues to maintain her workload on the treadmill and T4 nustep. We will continue to encourage her to increase her workloads, and monitor her progress in the program. Tanvika is doing well in rehab. She has been able to maintain a speed of 1.4 mph on the treadmill and level 4 on the T4 nustep. We will continue to encourage her to increase her workloads, and monitor her progress in the program. Cortisha continues to do well in rehab. She has been able to maintain a speed of 1.4 mph on the treadmill and level 4 on the T4 nustep. We will continue to encourage her to increase her workloads, and monitor her progress  in the program. Monisa has done in well in rehab and is close to graduating. She feels that she has improved her stamina over the coure of the program. She improved by 22.7% on her post . She will continue exercise by participating in a [employer] group in [place], called [employer]. At this class they will do aerobic exercise through dancing and stepping. Shewill also continue to use weights and resistance bands for resistance training.   Expected Outcomes Short: Add at least one day of Home exercise to current schedule, progress to 3 days post-graduation. Long: Continue to exercise to improve strength and stamina. Short: Increase workload on the treadmill, increase level on the T4 nustep to 5. Long: Continue to exercise to improve strength and stamina. Short: Increase workload on the treadmill, increase level on the T4 nustep to 5. Long: Continue to exercise to improve strength and stamina. Short: Increase workload on the treadmill, increase level on the T4 nustep to 5. Long: Continue to exercise to improve strength and stamina.  Short: Graduate. Long: Continue to exercise independently.            Discharge Exercise Prescription (Final Exercise Prescription Changes):  Exercise Prescription Changes - 10/18/23 1100       Response to Exercise   Blood Pressure (Admit) 112/60    Blood Pressure (Exit) 110/62    Heart Rate (Admit) 80 bpm    Heart Rate (Exercise) 119 bpm    Heart Rate (Exit) 84 bpm    Oxygen Saturation (Admit) 96 %    Oxygen Saturation (Exercise) 90 %    Oxygen Saturation (Exit) 90 %    Rating of Perceived Exertion (Exercise) 15    Perceived Dyspnea (Exercise) 0    Symptoms none    Duration Progress to 30 minutes of  aerobic without signs/symptoms of physical distress    Intensity THRR unchanged      Progression   Progression Continue to progress workloads to maintain intensity without signs/symptoms of physical distress.    Average METs 2.11      Resistance Training   Training Prescription Yes    Weight 3 lb    Reps 10-15      Interval Training   Interval Training No      Treadmill   MPH 1.4    Grade 0    Minutes 15    METs 2.07      NuStep   Level 4    Minutes 15    METs 2.3      Home Exercise Plan   Plans to continue exercise at [employer]    Frequency Add 2 additional days to program exercise sessions.    Initial Home Exercises Provided 09/21/23      Oxygen   Maintain Oxygen Saturation 88% or higher             Nutrition:  Target Goals: Understanding of nutrition guidelines, daily intake of sodium 1500mg , cholesterol 200mg , calories 30% from fat and 7% or less from saturated fats, daily to have 5 or more servings of fruits and vegetables.  Education: All About Nutrition: -Group instruction provided by verbal, written material, interactive activities, discussions, models, and posters to present general guidelines for heart healthy nutrition including fat, fiber, MyPlate, the role of sodium in heart healthy nutrition, utilization of the nutrition label, and  utilization of this knowledge for meal planning. Follow up email sent as well. Written material given at graduation. Flowsheet Row Cardiac Rehab from 10/07/2023 in  [facility] Cardiac and Pulmonary Rehab  Date 09/30/23  Educator JG  Instruction Review Code 1- Verbalizes Understanding       Biometrics:  Pre Biometrics - 07/15/23 1440       Pre Biometrics   Height 5' 3.5" (1.613 m)    Weight 181 lb 1.6 oz (82.1 kg)    Waist Circumference 40 inches    Hip Circumference 42.5 inches    Waist to Hip Ratio 0.94 %    BMI (Calculated) 31.57    Single Leg Stand 8.3 seconds             Post Biometrics - 10/12/23 0454        Post  Biometrics   Height 5' 3.5" (1.613 m)    Weight 177 lb 4.8 oz (80.4 kg)    Waist Circumference 39.5 inches    Hip Circumference 41 inches    Waist to Hip Ratio 0.96 %    BMI (Calculated) 30.91    Single Leg Stand 11.7 seconds             Nutrition Therapy Plan and Nutrition Goals:  Nutrition Therapy & Goals - 09/21/23 0937       Nutrition Therapy   RD appointment deferred Yes             Nutrition Assessments:  MEDIFICTS Score Key: >=70 Need to make dietary changes  40-70 Heart Healthy Diet <= 40 Therapeutic Level Cholesterol Diet  Flowsheet Row Cardiac Rehab from 10/26/2023 in [facility] Cardiac and Pulmonary Rehab  Picture Your Plate Total Score on Admission 62      Picture Your Plate Scores: <09 Unhealthy dietary pattern with much room for improvement. 41-50 Dietary pattern unlikely to meet recommendations for good health and room for improvement. 51-60 More healthful dietary pattern, with some room for improvement.  >60 Healthy dietary pattern, although there may be some specific behaviors that could be improved.    Nutrition Goals Re-Evaluation:  Nutrition Goals Re-Evaluation     Row Name 09/21/23 [phone]             Goals   Comment Pt has deferred to meet with the RD at this time.                Nutrition Goals  Discharge (Final Nutrition Goals Re-Evaluation):  Nutrition Goals Re-Evaluation - 09/21/23 1478       Goals   Comment Pt has deferred to meet with the RD at this time.             Psychosocial: Target Goals: Acknowledge presence or absence of significant depression and/or stress, maximize coping skills, provide positive support system. Participant is able to verbalize types and ability to use techniques and skills needed for reducing stress and depression.   Education: Stress, Anxiety, and Depression - Group verbal and visual presentation to define topics covered.  Reviews how body is impacted by stress, anxiety, and depression.  Also discusses healthy ways to reduce stress and to treat/manage anxiety and depression.  Written material given at graduation. Flowsheet Row Cardiac Rehab from 10/07/2023 in [facility] Cardiac and Pulmonary Rehab  Date 08/26/23  Educator SB  Instruction Review Code 1- [employer] Understanding       Education: Sleep Hygiene -Provides group verbal and written instruction about how sleep can affect your health.  Define sleep hygiene, discuss sleep cycles and impact of sleep habits. Review good sleep hygiene tips.  Flowsheet Row Cardiac Rehab from 08/18/2018 in [facility] Cardiac and Pulmonary Rehab  Date 07/19/18  Educator KC  Instruction Review Code 1- Verbalizes Understanding       Initial Review & Psychosocial Screening:  Initial Psych Review & Screening - 07/13/23 1317       Initial Review   Current issues with None Identified      Family Dynamics   Good Support System? Yes   husband, son and daughter in law, other daughter     Barriers   Psychosocial barriers to participate in program There are no identifiable barriers or psychosocial needs.      Screening Interventions   Interventions To provide support and resources with identified psychosocial needs;Provide feedback about the scores to participant;Encouraged to exercise    Expected Outcomes Short  Term goal: Utilizing psychosocial counselor, staff and physician to assist with identification of specific Stressors or current issues interfering with healing process. Setting desired goal for each stressor or current issue identified.;Long Term Goal: Stressors or current issues are controlled or eliminated.;Short Term goal: Identification and review with participant of any Quality of Life or Depression concerns found by scoring the questionnaire.;Long Term goal: The participant improves quality of Life and PHQ9 Scores as seen by post scores and/or verbalization of changes             Quality of Life Scores:   Quality of Life - 10/26/23 1350       Quality of Life   Select Quality of Life      Quality of Life Scores   Health/Function Pre 25 %    Socioeconomic Pre 27.5 %    Psych/Spiritual Pre 28.93 %    Family Pre 28.3 %    GLOBAL Pre 26.83 %            Scores of 19 and below usually indicate a poorer quality of life in these areas.  A difference of  2-3 points is a clinically meaningful difference.  A difference of 2-3 points in the total score of the Quality of Life Index has been associated with significant improvement in overall quality of life, self-image, physical symptoms, and general health in studies assessing change in quality of life.  PHQ-9: Review Flowsheet  More data exists      10/26/2023 09/21/2023 08/17/2023 07/15/2023 05/17/2023  Depression screen PHQ 2/9  Decreased Interest 0 0 0 0 0  Down, Depressed, Hopeless 0 0 0 0 0  PHQ - 2 Score 0 0 0 0 0  Altered sleeping 1 0 - 0 1  Tired, decreased energy 1 0 - 2 1  Change in appetite 1 0 - 0 0  Feeling bad or failure about yourself  0 0 - 0 0  Trouble concentrating 0 0 - 0 0  Moving slowly or fidgety/restless 0 0 - 0 0  Suicidal thoughts 0 0 - 0 0  PHQ-9 Score 3 0 - 2 2  Difficult doing work/chores Not difficult at all Not difficult at all - Not difficult at all Not difficult at all   Interpretation of Total  Score  Total Score Depression Severity:  1-4 = Minimal depression, 5-9 = Mild depression, 10-14 = Moderate depression, 15-19 = Moderately severe depression, 20-27 = Severe depression   Psychosocial Evaluation and Intervention:  Psychosocial Evaluation - 07/13/23 1447       Psychosocial Evaluation & Interventions   Comments Marzie has no barriers to attending the program. She lives with her husband. He and her chidren and daughter in law are her support.  She is ready to  start the program and work on her exercise progression to get back to her 3 x a week  group exercise class.    Expected Outcomes STG Attends all scheduled sessions and is able to work on her exercise progression to return to level prior to the [facility]CA. LTG Continue to progress with her exericse without symptoms    Continue Psychosocial Services  Follow up required by staff             Psychosocial Re-Evaluation:  Psychosocial Re-Evaluation     Row Name 09/21/23 0933 10/19/23 0930           Psychosocial Re-Evaluation   Current issues with None Identified;Current Sleep Concerns None Identified;Current Sleep Concerns      Comments Donnisha reports no major stressors at this time. She does report the election causing some stress. She states that she is sleeping well overall, but does wake up throughout the night. She also reports that she has a good support system made up by family. She enjoys gardening for stress relief. Shaakira reports no major stressors at this time. She states that her sleep has improved as she is not waking up as frequently throughout the night. Her family is a good support system for her.      Expected Outcomes Short: Continue to exercise and use stress management techniques Long: Maintain positive outlook. Short: Continue to exercise and use stress management techniques. Long: Maintain positive outlook.      Interventions Encouraged to attend Cardiac Rehabilitation for the exercise Encouraged to attend  Cardiac Rehabilitation for the exercise      Continue Psychosocial Services  Follow up required by staff Follow up required by staff               Psychosocial Discharge (Final Psychosocial Re-Evaluation):  Psychosocial Re-Evaluation - 10/19/23 0930       Psychosocial Re-Evaluation   Current issues with None Identified;Current Sleep Concerns    Comments Stephane reports no major stressors at this time. She states that her sleep has improved as she is not waking up as frequently throughout the night. Her family is a good support system for her.    Expected Outcomes Short: Continue to exercise and use stress management techniques. Long: Maintain positive outlook.    Interventions Encouraged to attend Cardiac Rehabilitation for the exercise    Continue Psychosocial Services  Follow up required by staff             Vocational Rehabilitation: Provide vocational rehab assistance to qualifying candidates.   Vocational Rehab Evaluation & Intervention:  Vocational Rehab - 07/13/23 1319       Initial Vocational Rehab Evaluation & Intervention   Assessment shows need for Vocational Rehabilitation No      Vocational Rehab Re-Evaulation   Comments retired             Education: Education Goals: Education classes will be provided on a variety of topics geared toward better understanding of heart health and risk factor modification. Participant will state understanding/return demonstration of topics presented as noted by education test scores.  Learning Barriers/Preferences:  Learning Barriers/Preferences - 07/13/23 1319       Learning Barriers/Preferences   Learning Barriers None    Learning Preferences None             General Cardiac Education Topics:  AED/CPR: - Group verbal and written instruction with the use of models to demonstrate the basic use of the AED with the basic ABC's of resuscitation.  Flowsheet Row Cardiac Rehab from 08/18/2018 in [facility] Cardiac and  Pulmonary Rehab  Date 06/02/18  Educator SB  Instruction Review Code 1- Verbalizes Understanding       Anatomy and Cardiac Procedures: - Group verbal and visual presentation and models provide information about basic cardiac anatomy and function. Reviews the testing methods done to diagnose heart disease and the outcomes of the test results. Describes the treatment choices: Medical Management, Angioplasty, or Coronary Bypass Surgery for treating various heart conditions including Myocardial Infarction, Angina, Valve Disease, and Cardiac Arrhythmias.  Written material given at graduation. Flowsheet Row Cardiac Rehab from 10/07/2023 in [facility] Cardiac and Pulmonary Rehab  Date 10/07/23  Educator SB  Instruction Review Code 1- Verbalizes Understanding       Medication Safety: - Group verbal and visual instruction to review commonly prescribed medications for heart and lung disease. Reviews the medication, class of the drug, and side effects. Includes the steps to properly store meds and maintain the prescription regimen.  Written material given at graduation. Flowsheet Row Cardiac Rehab from 08/18/2018 in [facility] Cardiac and Pulmonary Rehab  Date 07/12/18  Educator SB  Instruction Review Code 5- Refused Teaching       Intimacy: - Group verbal instruction through game format to discuss how heart and lung disease can affect sexual intimacy. Written material given at graduation.. Flowsheet Row Cardiac Rehab from 10/07/2023 in [facility] Cardiac and Pulmonary Rehab  Date 09/16/23  Educator MB  Instruction Review Code 1- Verbalizes Understanding       Know Your Numbers and Heart Failure: - Group verbal and visual instruction to discuss disease risk factors for cardiac and pulmonary disease and treatment options.  Reviews associated critical values for Overweight/Obesity, Hypertension, Cholesterol, and Diabetes.  Discusses basics of heart failure: signs/symptoms and treatments.  Introduces Heart  Failure Zone chart for action plan for heart failure.  Written material given at graduation. Flowsheet Row Cardiac Rehab from 10/07/2023 in [facility] Cardiac and Pulmonary Rehab  Date 08/18/23  Educator SB  Instruction Review Code 1- Verbalizes Understanding       Infection Prevention: - Provides verbal and written material to individual with discussion of infection control including proper hand washing and proper equipment cleaning during exercise session. Flowsheet Row Cardiac Rehab from 10/07/2023 in [facility] Cardiac and Pulmonary Rehab  Date 07/15/23  Educator NT  Instruction Review Code 1- Verbalizes Understanding       Falls Prevention: - Provides verbal and written material to individual with discussion of falls prevention and safety. Flowsheet Row Cardiac Rehab from 10/07/2023 in [facility] Cardiac and Pulmonary Rehab  Date 07/13/23  Educator SB  Instruction Review Code 1- Verbalizes Understanding       Other: -Provides group and verbal instruction on various topics (see comments)   Knowledge Questionnaire Score:  Knowledge Questionnaire Score - 10/26/23 1350       Knowledge Questionnaire Score   Pre Score 26/26             Core Components/Risk Factors/Patient Goals at Admission:  Personal Goals and Risk Factors at Admission - 07/13/23 1320       Core Components/Risk Factors/Patient Goals on Admission    Weight Management Yes;Weight Loss    Intervention Weight Management: Develop a combined nutrition and exercise program designed to reach desired caloric intake, while maintaining appropriate intake of nutrient and fiber, sodium and fats, and appropriate energy expenditure required for the weight goal.;Weight Management: Provide education and appropriate resources to help participant work on and attain dietary  goals.    Admit Weight 179 lb (81.2 kg)    Goal Weight: Short Term 178 lb (80.7 kg)    Goal Weight: Long Term 169 lb (76.7 kg)    Expected Outcomes Short Term:  Continue to assess and modify interventions until short term weight is achieved;Long Term: Adherence to nutrition and physical activity/exercise program aimed toward attainment of established weight goal    Improve shortness of breath with ADL's Yes    Intervention Provide education, individualized exercise plan and daily activity instruction to help decrease symptoms of SOB with activities of daily living.    Expected Outcomes Short Term: Improve cardiorespiratory fitness to achieve a reduction of symptoms when performing ADLs;Long Term: Be able to perform more ADLs without symptoms or delay the onset of symptoms    Diabetes Yes   A1C every 6 months   diet controlled   Intervention Provide education about signs/symptoms and action to take for hypo/hyperglycemia.;Provide education about proper nutrition, including hydration, and aerobic/resistive exercise prescription along with prescribed medications to achieve blood glucose in normal ranges: Fasting glucose 65-99 mg/dL    Expected Outcomes Short Term: Participant verbalizes understanding of the signs/symptoms and immediate care of hyper/hypoglycemia, proper foot care and importance of medication, aerobic/resistive exercise and nutrition plan for blood glucose control.;Long Term: Attainment of HbA1C < 7%.    Heart Failure Yes    Intervention Provide a combined exercise and nutrition program that is supplemented with education, support and counseling about heart failure. Directed toward relieving symptoms such as shortness of breath, decreased exercise tolerance, and extremity edema.    Expected Outcomes Improve functional capacity of life;Short term: Attendance in program 2-3 days a week with increased exercise capacity. Reported lower sodium intake. Reported increased fruit and vegetable intake. Reports medication compliance.;Short term: Daily weights obtained and reported for increase. Utilizing diuretic protocols set by physician.;Long term: Adoption of  self-care skills and reduction of barriers for early signs and symptoms recognition and intervention leading to self-care maintenance.    Hypertension Yes    Intervention Provide education on lifestyle modifcations including regular physical activity/exercise, weight management, moderate sodium restriction and increased consumption of fresh fruit, vegetables, and low fat dairy, alcohol moderation, and smoking cessation.;Monitor prescription use compliance.    Expected Outcomes Short Term: Continued assessment and intervention until BP is < 140/43mm HG in hypertensive participants. < 130/57mm HG in hypertensive participants with diabetes, heart failure or chronic kidney disease.;Long Term: Maintenance of blood pressure at goal levels.    Lipids Yes    Intervention Provide education and support for participant on nutrition & aerobic/resistive exercise along with prescribed medications to achieve LDL 70mg , HDL >40mg .    Expected Outcomes Short Term: Participant states understanding of desired cholesterol values and is compliant with medications prescribed. Participant is following exercise prescription and nutrition guidelines.;Long Term: Cholesterol controlled with medications as prescribed, with individualized exercise RX and with personalized nutrition plan. Value goals: LDL < 70mg , HDL > 40 mg.             Education:Diabetes - Individual verbal and written instruction to review signs/symptoms of diabetes, desired ranges of glucose level fasting, after meals and with exercise. Acknowledge that pre and post exercise glucose checks will be done for 3 sessions at entry of program.   Core Components/Risk Factors/Patient Goals Review:   Goals and Risk Factor Review     Row Name 09/21/23 0937 10/19/23 0932           Core Components/Risk Factors/Patient Goals  Review   Personal Goals Review Weight Management/Obesity;Diabetes;Hypertension Weight Management/Obesity;Diabetes;Hypertension       Review Salsabeel reports that she would still like to lose some weight. Her short term goal is to lose 5 lbs. Zai has not been checking her sugars routinely, but states that her diabetes is well controlled at this time. Wilhelmena states that she does not check her BP at home routinely, although she does have a BP cuff at home. Her BP has stayed within normal ranges at rehab. Tinea states that she is still working on losing a little weight although she does not have a specific weight goal in mind. Bissie states that her sugars continue to be well controlled at this time as well. She states she has not been checking her BP routinely. However, if she feels bad she will check it. She also reports continuing to take all her medications as prescribed.      Expected Outcomes Short: Work towards weight goal through diet and exercise. Long: Continue to manage lifestyle risk factors. Short: Continue to work on weight loss through diet and exercise. Long: Continue to manage lifestyle risk factors.               Core Components/Risk Factors/Patient Goals at Discharge (Final Review):   Goals and Risk Factor Review - 10/19/23 0932       Core Components/Risk Factors/Patient Goals Review   Personal Goals Review Weight Management/Obesity;Diabetes;Hypertension    Review Indi states that she is still working on losing a little weight although she does not have a specific weight goal in mind. Brookelynne states that her sugars continue to be well controlled at this time as well. She states she has not been checking her BP routinely. However, if she feels bad she will check it. She also reports continuing to take all her medications as prescribed.    Expected Outcomes Short: Continue to work on weight loss through diet and exercise. Long: Continue to manage lifestyle risk factors.             ITP Comments:  ITP Comments     Row Name 07/13/23 1453 07/15/23 1426 07/21/23 1041 08/09/23 1629 08/18/23 1217   ITP  Comments Virtual orientation call completed today. shehas an appointment on Date: 07/15/23  for EP eval and gym Orientation.  Documentation of diagnosis can be found in [place] 06/25/2023 . Completed and gym orientation. Initial ITP created and sent for review to Dr. Bethann Punches, Medical Director. 30 Day review completed. Medical Director ITP review done, changes made as directed, and signed approval by Medical Director.     new to program Alexanna called rehab today to let us know she went to ER at [facility] with angina and will be transported to [facility] for angioplasty. 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 Will place on medical hold until cardiac clearance received to resume rehab. 30 Day review completed. Medical Director ITP review done, changes made as directed, and signed approval by Medical Director.    Row Name 09/15/23 1307 10/06/23 1156 11/03/23 1257       ITP Comments 30 Day review completed. Medical Director ITP review done, changes made as directed, and signed approval by Medical Director. 30 Day review completed. Medical Director ITP review done, changes made as directed, and signed approval by Medical Director. Discharged              Comments: Discharged

## 2023-11-03 NOTE — Progress Notes (Signed)
Discharge Note for  Stacie Mcdonald     December 11, 1945        Completed 36 sessions.  Will exercise at home/gym.     6 Minute Walk     Row Name 07/15/23 1438 10/12/23 0934       6 Minute Walk   Phase Initial Discharge    Distance 1055 feet 1295 feet    Distance % Change -- 22.7 %    Distance Feet Change -- 240 ft    Walk Time 6 minutes 6 minutes    # of Rest Breaks 0 0    MPH 1.99 2.45    METS 1.92 2.51    RPE 15 15    Perceived Dyspnea  2 3    VO2 Peak 6.72 8.78    Symptoms Yes (comment) Yes (comment)    Comments SOB, leg weakness SOB    Resting HR 80 bpm 80 bpm    Resting BP 124/68 112/60    Resting Oxygen Saturation  93 % 95 %    Exercise Oxygen Saturation  during 6 min walk 93 % 93 %    Max Ex. HR 108 bpm 116 bpm    Max Ex. BP 142/74 148/70    2 Minute Post BP 120/68 --

## 2023-12-01 DIAGNOSIS — G4733 Obstructive sleep apnea (adult) (pediatric): Secondary | ICD-10-CM | POA: Diagnosis not present

## 2023-12-20 ENCOUNTER — Other Ambulatory Visit: Payer: Self-pay | Admitting: Internal Medicine

## 2023-12-20 DIAGNOSIS — Z1231 Encounter for screening mammogram for malignant neoplasm of breast: Secondary | ICD-10-CM

## 2023-12-21 ENCOUNTER — Ambulatory Visit
Admission: RE | Admit: 2023-12-21 | Discharge: 2023-12-21 | Disposition: A | Discharge status: Home / Self Care | Payer: Medicare Other | Source: Ambulatory Visit | Attending: Internal Medicine | Admitting: Internal Medicine

## 2023-12-21 DIAGNOSIS — Z1231 Encounter for screening mammogram for malignant neoplasm of breast: Secondary | ICD-10-CM | POA: Insufficient documentation

## 2023-12-27 DIAGNOSIS — E113393 Type 2 diabetes mellitus with moderate nonproliferative diabetic retinopathy without macular edema, bilateral: Secondary | ICD-10-CM | POA: Diagnosis not present

## 2023-12-27 DIAGNOSIS — H524 Presbyopia: Secondary | ICD-10-CM | POA: Diagnosis not present

## 2023-12-30 DIAGNOSIS — H04123 Dry eye syndrome of bilateral lacrimal glands: Secondary | ICD-10-CM | POA: Diagnosis not present

## 2023-12-30 DIAGNOSIS — H40003 Preglaucoma, unspecified, bilateral: Secondary | ICD-10-CM | POA: Diagnosis not present

## 2023-12-30 DIAGNOSIS — Z961 Presence of intraocular lens: Secondary | ICD-10-CM | POA: Diagnosis not present

## 2023-12-30 DIAGNOSIS — H2511 Age-related nuclear cataract, right eye: Secondary | ICD-10-CM | POA: Diagnosis not present

## 2023-12-30 DIAGNOSIS — Z0100 Encounter for examination of eyes and vision without abnormal findings: Secondary | ICD-10-CM | POA: Diagnosis not present

## 2023-12-30 LAB — HM DIABETES EYE EXAM

## 2024-01-18 ENCOUNTER — Ambulatory Visit: Payer: Medicare Other | Admitting: Internal Medicine

## 2024-01-18 ENCOUNTER — Encounter: Payer: Self-pay | Admitting: Internal Medicine

## 2024-01-18 VITALS — BP 108/62 | HR 88 | Ht 63.5 in | Wt 175.8 lb

## 2024-01-18 DIAGNOSIS — R944 Abnormal results of kidney function studies: Secondary | ICD-10-CM | POA: Diagnosis not present

## 2024-01-18 DIAGNOSIS — E1169 Type 2 diabetes mellitus with other specified complication: Secondary | ICD-10-CM

## 2024-01-18 DIAGNOSIS — H2511 Age-related nuclear cataract, right eye: Secondary | ICD-10-CM | POA: Diagnosis not present

## 2024-01-18 DIAGNOSIS — I10 Essential (primary) hypertension: Secondary | ICD-10-CM

## 2024-01-18 DIAGNOSIS — E785 Hyperlipidemia, unspecified: Secondary | ICD-10-CM | POA: Diagnosis not present

## 2024-01-18 DIAGNOSIS — Z9861 Coronary angioplasty status: Secondary | ICD-10-CM

## 2024-01-18 DIAGNOSIS — E1159 Type 2 diabetes mellitus with other circulatory complications: Secondary | ICD-10-CM

## 2024-01-18 DIAGNOSIS — I2510 Atherosclerotic heart disease of native coronary artery without angina pectoris: Secondary | ICD-10-CM

## 2024-01-18 LAB — COMPREHENSIVE METABOLIC PANEL
ALT: 18 U/L (ref 0–35)
AST: 16 U/L (ref 0–37)
Albumin: 4.8 g/dL (ref 3.5–5.2)
Alkaline Phosphatase: 49 U/L (ref 39–117)
BUN: 26 mg/dL — ABNORMAL HIGH (ref 6–23)
CO2: 29 meq/L (ref 19–32)
Calcium: 9.7 mg/dL (ref 8.4–10.5)
Chloride: 100 meq/L (ref 96–112)
Creatinine, Ser: 1.12 mg/dL (ref 0.40–1.20)
GFR: 46.93 mL/min — ABNORMAL LOW (ref >60.00)
Glucose, Bld: 116 mg/dL — ABNORMAL HIGH (ref 70–99)
Potassium: 4.2 meq/L (ref 3.5–5.1)
Sodium: 139 meq/L (ref 135–145)
Total Bilirubin: 0.6 mg/dL (ref 0.2–1.2)
Total Protein: 8 g/dL (ref 6.0–8.3)

## 2024-01-18 LAB — LIPID PANEL
Cholesterol: 141 mg/dL (ref 0–200)
HDL: 44.1 mg/dL (ref >39.00)
LDL Cholesterol: 64 mg/dL (ref 0–99)
NonHDL: 96.65
Total CHOL/HDL Ratio: 3
Triglycerides: 164 mg/dL — ABNORMAL HIGH (ref 0.0–149.0)
VLDL: 32.8 mg/dL (ref 0.0–40.0)

## 2024-01-18 LAB — HEMOGLOBIN A1C: Hgb A1c MFr Bld: 6.5 % (ref 4.6–6.5)

## 2024-01-18 LAB — MICROALBUMIN / CREATININE URINE RATIO
Creatinine,U: 46.5 mg/dL
Microalb Creat Ratio: 16.5 mg/g (ref 0.0–30.0)
Microalb, Ur: 0.8 mg/dL (ref 0.0–1.9)

## 2024-01-18 LAB — LDL CHOLESTEROL, DIRECT: Direct LDL: 88 mg/dL

## 2024-01-18 MED ORDER — METOPROLOL SUCCINATE ER 200 MG PO TB24: ORAL_TABLET | ORAL | 1 refills | Status: DC

## 2024-01-18 MED ORDER — PANTOPRAZOLE SODIUM 40 MG PO TBEC: 40.0000 mg | DELAYED_RELEASE_TABLET | Freq: Every day | ORAL | 1 refills | Status: DC

## 2024-01-18 MED ORDER — EMPAGLIFLOZIN 10 MG PO TABS: 10.0000 mg | ORAL_TABLET | Freq: Every day | ORAL | 2 refills | Status: DC

## 2024-01-18 MED ORDER — TRAZODONE HCL 150 MG PO TABS: 150.0000 mg | ORAL_TABLET | Freq: Every day | ORAL | 0 refills | Status: DC

## 2024-01-18 MED ORDER — CLOPIDOGREL BISULFATE 75 MG PO TABS: 75.0000 mg | ORAL_TABLET | Freq: Every day | ORAL | 1 refills | Status: DC

## 2024-01-18 NOTE — Patient Instructions (Addendum)
 We may be able to reduce your furosemide dose since you are taking Jardiance.  The combination may be too dehydrating based on your exam and your last assessment of kidney function over the last several months .  We are repeating today

## 2024-01-18 NOTE — Progress Notes (Unsigned)
 Subjective:  Patient ID: Stacie Mcdonald, female    DOB: [birth year]  Age: 78 y.o. MRN: [id number]  CC: There were no encounter diagnoses.   HPI Stacie Mcdonald presents for  Chief Complaint  Patient presents with   Medical Management of Chronic Issues   Admitted to OSH with [place]  on 9/24  following RCA stent placement and angioplasty on June 25 2023 by [facility]   Cardiac cath done Sept 25 noted :  Single-vessel obstructive CAD - 99% mid RCA (recurrent severe ISR)  2. Non-obstructive CAD in the left coronary system  3. Patent stent with mild-mod ISR in the prox LAD  4. Successful IVUS-guided PTCA/Shockwave/DES x1 to mid RCA.  Given recoil findings on IVUS, decision made to treat with additional stent rather than DCB alone.  The vessel is ~3.56mm and the prior stents were 3.73mm.  Improved expansion and no recoil in  the mid RCA after 3.81mm DES and post-dilation to high-pressure with 3.50mm [place] balloon.   Plavix was continued. No new meds.  Completed cardiopulmonary rehab.  Has resumed her exercise class at [employer] without chest pain  Having some typical chest pain z(hers has always been left side hjaw pain) without diaphoresis or nausea  occurring  at night at rest while sitting up , which has responded to 1 TG  Weighs daily and uses extra lasix when feet swell  Outpatient Medications Prior to Visit  Medication Sig Dispense Refill   albuterol (VENTOLIN HFA) 108 (90 Base) MCG/ACT inhaler Inhale 2 puffs into the lungs every 6 (six) hours as needed. Using in Am daily  per MD     aspirin 81 MG tablet Take 81 mg by mouth daily.     clopidogrel (PLAVIX) 75 MG tablet TAKE ONE TABLET BY MOUTH DAILY (Patient taking differently: Take 75 mg by mouth daily.) 90 tablet 1   D-MANNOSE PO Take 800 mg by mouth daily.     ELDERBERRY PO Take 1,000 mg by mouth daily.     empagliflozin (JARDIANCE) 10 MG TABS tablet Take 1 tablet (10 mg total) by mouth daily before breakfast. 90 tablet 2    furosemide (LASIX) 40 MG tablet Take 40 mg by mouth daily.     GLUCOSAMINE-CHONDROITIN PO Take 2 tablets by mouth daily.     isosorbide mononitrate (IMDUR) 120 MG 24 hr tablet Take 120 mg by mouth daily.      loratadine (CLARITIN) 10 MG tablet Take 10 mg by mouth daily as needed for allergies.     metoprolol (TOPROL-XL) 200 MG 24 hr tablet TAKE 1 TABLET BY MOUTH DAILY. 90 tablet 1   nitroGLYCERIN (NITROSTAT) 0.4 MG SL tablet Place 0.4 mg under the tongue every 5 (five) minutes as needed for chest pain.     pantoprazole (PROTONIX) 40 MG tablet Take 1 tablet (40 mg total) by mouth daily. 90 tablet 1   PRALUENT 75 MG/ML SOAJ Inject 75 mg into the muscle every 14 (fourteen) days.     ranolazine (RANEXA) 500 MG 12 hr tablet Take 500 mg by mouth 2 (two) times daily.     sacubitril-valsartan (ENTRESTO) 49-51 MG Take 1 tablet by mouth 2 (two) times daily.     spironolactone (ALDACTONE) 25 MG tablet Take 1 tablet (25 mg total) by mouth daily.     traZODone (DESYREL) 150 MG tablet Take 1 tablet (150 mg total) by mouth at bedtime. 90 tablet 0   fluconazole (DIFLUCAN) 150 MG tablet Take 1 tablet (  150 mg total) by mouth daily. (Patient not taking: Reported on 01/18/2024) 1 tablet 0   sulfamethoxazole-trimethoprim (BACTRIM DS) 800-160 MG tablet Take 1 tablet by mouth 2 (two) times daily. (Patient not taking: Reported on 01/18/2024) 10 tablet 0   No facility-administered medications prior to visit.    Review of Systems;  Patient denies headache, fevers, malaise, unintentional weight loss, skin rash, eye pain, sinus congestion and sinus pain, sore throat, dysphagia,  hemoptysis , cough, dyspnea, wheezing, chest pain, palpitations, orthopnea, edema, abdominal pain, nausea, melena, diarrhea, constipation, flank pain, dysuria, hematuria, urinary  Frequency, nocturia, numbness, tingling, seizures,  Focal weakness, Loss of consciousness,  Tremor, insomnia, depression, anxiety, and suicidal ideation.      Objective:   BP 108/62   Pulse 88   Ht 5' 3.5" (1.613 m)   Wt 175 lb 12.8 oz (79.7 kg)   SpO2 95%   BMI 30.65 kg/m   BP Readings from Last 3 Encounters:  01/18/24 108/62  09/21/23 116/76  09/18/23 129/82    Wt Readings from Last 3 Encounters:  01/18/24 175 lb 12.8 oz (79.7 kg)  10/12/23 177 lb 4.8 oz (80.4 kg)  09/21/23 176 lb (79.8 kg)    Physical Exam  Lab Results  Component Value Date   HGBA1C 6.0 08/09/2023   HGBA1C 6.3 11/23/2022   HGBA1C 6.6 (H) 05/06/2022    Lab Results  Component Value Date   CREATININE 1.02 09/08/2023   CREATININE 1.07 08/17/2023   CREATININE 0.74 08/08/2023    Lab Results  Component Value Date   WBC 6.6 08/17/2023   HGB 13.5 08/17/2023   HCT 41.7 08/17/2023   PLT 288.0 08/17/2023   GLUCOSE 104 (H) 09/08/2023   CHOL 113 09/08/2023   TRIG 189.0 (H) 09/08/2023   HDL 34.10 (L) 09/08/2023   LDLDIRECT 61.0 09/08/2023   LDLCALC 41 09/08/2023   ALT 14 09/08/2023   AST 13 09/08/2023   NA 139 09/08/2023   K 4.1 09/08/2023   CL 104 09/08/2023   CREATININE 1.02 09/08/2023   BUN 20 09/08/2023   CO2 29 09/08/2023   TSH 3.11 08/17/2023   INR 1.0 08/07/2023   HGBA1C 6.0 08/09/2023   MICROALBUR <0.7 08/17/2023    MM 3D SCREENING MAMMOGRAM UNILATERAL LEFT BREAST Result Date: 12/24/2023 CLINICAL DATA:  Screening. EXAM: DIGITAL SCREENING UNILATERAL LEFT MAMMOGRAM WITH CAD AND TOMOSYNTHESIS TECHNIQUE: Left screening digital craniocaudal and mediolateral oblique mammograms were obtained. Left screening digital breast tomosynthesis was performed. The images were evaluated with computer-aided detection. COMPARISON:  Previous exam(s). ACR Breast Density Category c: The breasts are heterogeneously dense, which may obscure small masses. FINDINGS: There are no findings suspicious for malignancy. IMPRESSION: No mammographic evidence of malignancy. A result letter of this screening mammogram will be mailed directly to the patient. RECOMMENDATION: Screening mammogram in  one year. (Code:[id number]-B-01Y) BI-RADS CATEGORY  1: Negative. Electronically Signed   By: Elberta Fortis M.D.   On: 12/24/2023 08:30    Assessment & Plan:  .There are no diagnoses linked to this encounter.   I spent 34 minutes on the day of this face to face encounter reviewing patient's  most recent visit with cardiology,  nephrology,  and neurology,  prior relevant surgical and non surgical procedures, recent  labs and imaging studies, counseling on weight management,  reviewing the assessment and plan with patient, and post visit ordering and reviewing of  diagnostics and therapeutics with patient  .   Follow-up: No follow-ups on file.   Mar Daring  Darrick Huntsman, MD

## 2024-01-19 ENCOUNTER — Encounter: Payer: Self-pay | Admitting: Internal Medicine

## 2024-01-19 ENCOUNTER — Encounter: Payer: Self-pay | Admitting: Ophthalmology

## 2024-01-19 ENCOUNTER — Other Ambulatory Visit: Payer: Self-pay

## 2024-01-19 DIAGNOSIS — R0609 Other forms of dyspnea: Secondary | ICD-10-CM | POA: Diagnosis not present

## 2024-01-19 DIAGNOSIS — R918 Other nonspecific abnormal finding of lung field: Secondary | ICD-10-CM | POA: Diagnosis not present

## 2024-01-19 DIAGNOSIS — G4733 Obstructive sleep apnea (adult) (pediatric): Secondary | ICD-10-CM | POA: Diagnosis not present

## 2024-01-19 DIAGNOSIS — K080 Exfoliation of teeth due to systemic causes: Secondary | ICD-10-CM | POA: Diagnosis not present

## 2024-01-19 DIAGNOSIS — J986 Disorders of diaphragm: Secondary | ICD-10-CM | POA: Diagnosis not present

## 2024-01-19 MED ORDER — EZETIMIBE 10 MG PO TABS: 10.0000 mg | ORAL_TABLET | Freq: Every day | ORAL | 3 refills | Status: DC

## 2024-01-19 NOTE — Assessment & Plan Note (Signed)
 Adding zetia to praluent  Lab Results  Component Value Date   CHOL 141 01/18/2024   HDL 44.10 01/18/2024   LDLCALC 64 01/18/2024   LDLDIRECT 88.0 01/18/2024   TRIG 164.0 (H) 01/18/2024   CHOLHDL 3 01/18/2024

## 2024-01-19 NOTE — Assessment & Plan Note (Signed)
 Well controlled on current regimen. Renal function has dipped slightly,  no changes today. Repeat in 3 months   Lab Results  Component Value Date   CREATININE 1.12 01/18/2024   Lab Results  Component Value Date   NA 139 01/18/2024   K 4.2 01/18/2024   CL 100 01/18/2024   CO2 29 01/18/2024   Lab Results  Component Value Date   MICROALBUR 0.8 01/18/2024   MICROALBUR <0.7 08/17/2023

## 2024-01-19 NOTE — Assessment & Plan Note (Signed)
 With recent [facility] admission for [place] in Sept 2024 due to in stent restenosis of RCA which was stented in August 2024 ,  she is taking praluent.; will add zetia as a trial to lower LDL to < 55     Lab Results  Component Value Date   CHOL 141 01/18/2024   HDL 44.10 01/18/2024   LDLCALC 64 01/18/2024   LDLDIRECT 88.0 01/18/2024   TRIG 164.0 (H) 01/18/2024   CHOLHDL 3 01/18/2024

## 2024-01-19 NOTE — Assessment & Plan Note (Signed)
 She remains well-controlled and is now tolerating Jardiance  supplied via [facility]  pharmaceutical assistance. . Patient is intolerant of  statin therapy for CAD risk reduction but is tolerating a PCSK9 (Praluent) and Is taking valsartan. Lab Results  Component Value Date   HGBA1C 6.5 01/18/2024     Lab Results  Component Value Date   ALT 18 01/18/2024   AST 16 01/18/2024   ALKPHOS 49 01/18/2024   BILITOT 0.6 01/18/2024   Lab Results  Component Value Date   CHOL 141 01/18/2024   HDL 44.10 01/18/2024   LDLCALC 64 01/18/2024   LDLDIRECT 88.0 01/18/2024   TRIG 164.0 (H) 01/18/2024   CHOLHDL 3 01/18/2024

## 2024-01-19 NOTE — Assessment & Plan Note (Addendum)
 She underwent stenting of RCA in August which developed ISR resulting in admission in September for [place].  Repeat cath was done and " Successful IVUS-guided PTCA/Shockwave/DES x1 to mid RCA.  Given recoil findings on IVUS, decision made to treat with additional stent rather than DCB alone.  The vessel is ~3.71mm and the prior stents were 3.26mm.  Improved expansion and no recoil in the mid RCA after 3.54mm DES and post-dilation to high-pressure with 3.54mm [place] balloon.

## 2024-01-20 ENCOUNTER — Encounter: Payer: Self-pay | Admitting: Ophthalmology

## 2024-01-20 ENCOUNTER — Encounter: Payer: Self-pay | Admitting: Anesthesiology

## 2024-01-25 DIAGNOSIS — M542 Cervicalgia: Secondary | ICD-10-CM | POA: Diagnosis not present

## 2024-01-25 DIAGNOSIS — R6884 Jaw pain: Secondary | ICD-10-CM | POA: Diagnosis not present

## 2024-01-25 DIAGNOSIS — Z955 Presence of coronary angioplasty implant and graft: Secondary | ICD-10-CM | POA: Diagnosis not present

## 2024-01-25 DIAGNOSIS — I2510 Atherosclerotic heart disease of native coronary artery without angina pectoris: Secondary | ICD-10-CM | POA: Diagnosis not present

## 2024-02-02 ENCOUNTER — Ambulatory Visit: Admit: 2024-02-02 | Payer: Medicare Other | Admitting: Ophthalmology

## 2024-02-02 HISTORY — DX: Type 2 diabetes mellitus without complications: E11.9

## 2024-02-02 HISTORY — DX: Coronary angioplasty status: Z98.61

## 2024-02-02 SURGERY — PHACOEMULSIFICATION, CATARACT, WITH IOL INSERTION
Anesthesia: Topical | Laterality: Right

## 2024-02-07 DIAGNOSIS — I25110 Atherosclerotic heart disease of native coronary artery with unstable angina pectoris: Secondary | ICD-10-CM | POA: Diagnosis not present

## 2024-02-07 DIAGNOSIS — I129 Hypertensive chronic kidney disease with stage 1 through stage 4 chronic kidney disease, or unspecified chronic kidney disease: Secondary | ICD-10-CM | POA: Diagnosis not present

## 2024-02-07 DIAGNOSIS — Z955 Presence of coronary angioplasty implant and graft: Secondary | ICD-10-CM | POA: Diagnosis not present

## 2024-02-07 DIAGNOSIS — J449 Chronic obstructive pulmonary disease, unspecified: Secondary | ICD-10-CM | POA: Diagnosis not present

## 2024-02-07 DIAGNOSIS — Z87891 Personal history of nicotine dependence: Secondary | ICD-10-CM | POA: Diagnosis not present

## 2024-02-07 DIAGNOSIS — Z853 Personal history of malignant neoplasm of breast: Secondary | ICD-10-CM | POA: Diagnosis not present

## 2024-02-07 DIAGNOSIS — E785 Hyperlipidemia, unspecified: Secondary | ICD-10-CM | POA: Diagnosis not present

## 2024-02-07 DIAGNOSIS — N1831 Chronic kidney disease, stage 3a: Secondary | ICD-10-CM | POA: Diagnosis not present

## 2024-02-07 DIAGNOSIS — I255 Ischemic cardiomyopathy: Secondary | ICD-10-CM | POA: Diagnosis not present

## 2024-02-07 HISTORY — PX: CARDIAC CATHETERIZATION: SHX172

## 2024-02-08 ENCOUNTER — Encounter: Payer: Self-pay | Admitting: Internal Medicine

## 2024-02-16 ENCOUNTER — Encounter: Payer: Self-pay | Admitting: Ophthalmology

## 2024-02-16 NOTE — Anesthesia Preprocedure Evaluation (Addendum)
 Anesthesia Evaluation  Patient identified by MRN, date of birth, ID band Patient awake    Reviewed: Allergy & Precautions, H&P , NPO status , Patient's Chart, lab work & pertinent test results  Airway Mallampati: IV  TM Distance: >3 FB Neck ROM: Full    Dental  (+) Chipped Small chip right upper central incisor, slight gap between upper central incisors:   Pulmonary neg pulmonary ROS, asthma , sleep apnea    Pulmonary exam normal breath sounds clear to auscultation       Cardiovascular hypertension, + angina  + CAD and + Past MI  negative cardio ROS Normal cardiovascular exam Rhythm:Regular Rate:Normal  08--09-24   Mid LAD lesion is 50% stenosed.   Mid RCA-1 lesion is 90% stenosed.   Dist RCA lesion is 95% stenosed.   Non-stenotic Ost LAD to Prox LAD lesion was previously treated.   Non-stenotic Mid RCA-2 lesion was previously treated.   Balloon angioplasty was performed using a BALLN [place] TREK NEO RX 3.5X12.   Balloon angioplasty was performed using a BALLN [place] TREK NEO RX 3.0X15.   Post intervention, there is a 40% residual stenosis.   Post intervention, there is a 0% residual stenosis.   There is mild left ventricular systolic dysfunction.   The left ventricular ejection fraction is 50-55% by visual estimate.   There is no mitral valve regurgitation.   In the absence of any other complications or medical issues, we expect the patient to be ready for discharge from an interventional cardiology perspective on 06/26/2023.   Recommend dual antiplatelet therapy with Aspirin 81mg  daily and Clopidogrel 75mg  daily.      Neuro/Psych  Neuromuscular disease negative neurological ROS  negative psych ROS   GI/Hepatic negative GI ROS, Neg liver ROS,GERD  ,,  Endo/Other  negative endocrine ROSdiabetes    Renal/GU Renal diseasenegative Renal ROS  negative genitourinary   Musculoskeletal negative musculoskeletal ROS (+)  Arthritis ,    Abdominal   Peds negative pediatric ROS (+)  Hematology negative hematology ROS (+)   Anesthesia Other Findings Office note 02-04-24  Stacie Mcdonald is a 78 y.o. female with a PMHX of breast cancer, HTN, HLD, COPD, CKD stage 3a, multivessel CAD s/p multiple PCI's, most recently an ISR of the RCA in September 2024, and ICM (LVEF 35%), who presents for coronary angiography in setting of unstable angina.  Asthma  GERD (gastroesophageal reflux disease) Allergy Heart disease  Hypertension Hyperlipidemia  Rheumatic fever Urinary tract infection CAD (coronary artery disease) Colon polyps  Osteopenia due to cancer therapy Breast Cancer Breast cancer  Myocardial infarction  Sleep apnea Arthritis  Wears contact lenses Stable angina (HCC)  Thoracic vertebral fracture (HCC) SCC (squamous cell carcinoma), leg, right Pseudoaneurysm (HCC) Diabetes mellitus without complication S/P PTCA (percutaneous transluminal coronary angioplasty)    Reproductive/Obstetrics negative OB ROS                             Anesthesia Physical Anesthesia Plan  ASA: 3  Anesthesia Plan: MAC   Post-op Pain Management:    Induction: Intravenous  PONV Risk Score and Plan:   Airway Management Planned: Natural Airway and Nasal Cannula  Additional Equipment:   Intra-op Plan:   Post-operative Plan:   Informed Consent: I have reviewed the patients History and Physical, chart, labs and discussed the procedure including the risks, benefits and alternatives for the proposed anesthesia with the patient or authorized representative who has indicated his/her understanding  and acceptance.     Dental Advisory Given  Plan Discussed with: Anesthesiologist, CRNA and Surgeon  Anesthesia Plan Comments: (Patient consented for risks of anesthesia including but not limited to:  - adverse reactions to medications - damage to eyes, teeth, lips or other oral mucosa - nerve  damage due to positioning  - sore throat or hoarseness - Damage to heart, brain, nerves, lungs, other parts of body or loss of life  Patient voiced understanding and assent.)       Anesthesia Quick Evaluation

## 2024-02-21 NOTE — Discharge Instructions (Signed)

## 2024-02-22 DIAGNOSIS — M542 Cervicalgia: Secondary | ICD-10-CM | POA: Diagnosis not present

## 2024-02-22 DIAGNOSIS — I25110 Atherosclerotic heart disease of native coronary artery with unstable angina pectoris: Secondary | ICD-10-CM | POA: Diagnosis not present

## 2024-02-22 DIAGNOSIS — Z955 Presence of coronary angioplasty implant and graft: Secondary | ICD-10-CM | POA: Diagnosis not present

## 2024-02-22 DIAGNOSIS — I5042 Chronic combined systolic (congestive) and diastolic (congestive) heart failure: Secondary | ICD-10-CM | POA: Diagnosis not present

## 2024-02-23 ENCOUNTER — Ambulatory Visit: Payer: Self-pay | Admitting: Anesthesiology

## 2024-02-23 ENCOUNTER — Other Ambulatory Visit: Payer: Self-pay

## 2024-02-23 ENCOUNTER — Ambulatory Visit
Admission: RE | Admit: 2024-02-23 | Discharge: 2024-02-23 | Disposition: A | Discharge status: Home / Self Care | Attending: Ophthalmology | Admitting: Ophthalmology

## 2024-02-23 ENCOUNTER — Encounter
Admission: RE | Disposition: A | Discharge status: Home / Self Care | Payer: Self-pay | Source: Home / Self Care | Attending: Ophthalmology

## 2024-02-23 ENCOUNTER — Encounter: Payer: Self-pay | Admitting: Ophthalmology

## 2024-02-23 DIAGNOSIS — N1831 Chronic kidney disease, stage 3a: Secondary | ICD-10-CM | POA: Insufficient documentation

## 2024-02-23 DIAGNOSIS — E1136 Type 2 diabetes mellitus with diabetic cataract: Secondary | ICD-10-CM | POA: Insufficient documentation

## 2024-02-23 DIAGNOSIS — G4730 Sleep apnea, unspecified: Secondary | ICD-10-CM | POA: Insufficient documentation

## 2024-02-23 DIAGNOSIS — K219 Gastro-esophageal reflux disease without esophagitis: Secondary | ICD-10-CM | POA: Diagnosis not present

## 2024-02-23 DIAGNOSIS — Z955 Presence of coronary angioplasty implant and graft: Secondary | ICD-10-CM | POA: Insufficient documentation

## 2024-02-23 DIAGNOSIS — J45909 Unspecified asthma, uncomplicated: Secondary | ICD-10-CM | POA: Insufficient documentation

## 2024-02-23 DIAGNOSIS — E1122 Type 2 diabetes mellitus with diabetic chronic kidney disease: Secondary | ICD-10-CM | POA: Insufficient documentation

## 2024-02-23 DIAGNOSIS — I129 Hypertensive chronic kidney disease with stage 1 through stage 4 chronic kidney disease, or unspecified chronic kidney disease: Secondary | ICD-10-CM | POA: Insufficient documentation

## 2024-02-23 DIAGNOSIS — I2510 Atherosclerotic heart disease of native coronary artery without angina pectoris: Secondary | ICD-10-CM | POA: Insufficient documentation

## 2024-02-23 DIAGNOSIS — I252 Old myocardial infarction: Secondary | ICD-10-CM | POA: Insufficient documentation

## 2024-02-23 DIAGNOSIS — Z7902 Long term (current) use of antithrombotics/antiplatelets: Secondary | ICD-10-CM | POA: Diagnosis not present

## 2024-02-23 DIAGNOSIS — H2511 Age-related nuclear cataract, right eye: Secondary | ICD-10-CM | POA: Insufficient documentation

## 2024-02-23 DIAGNOSIS — I25118 Atherosclerotic heart disease of native coronary artery with other forms of angina pectoris: Secondary | ICD-10-CM | POA: Diagnosis not present

## 2024-02-23 HISTORY — DX: Chronic kidney disease, stage 3a: N18.31

## 2024-02-23 HISTORY — PX: CATARACT EXTRACTION W/PHACO: SHX586

## 2024-02-23 LAB — GLUCOSE, CAPILLARY: Glucose-Capillary: 114 mg/dL — ABNORMAL HIGH (ref 70–99)

## 2024-02-23 SURGERY — PHACOEMULSIFICATION, CATARACT, WITH IOL INSERTION
Anesthesia: Monitor Anesthesia Care | Site: Eye | Laterality: Right

## 2024-02-23 MED ORDER — MIDAZOLAM HCL 2 MG/2ML IJ SOLN
INTRAMUSCULAR | Status: DC | PRN
Start: 2024-02-23 — End: 2024-02-23
  Administered 2024-02-23 (×2): 1 mg via INTRAVENOUS

## 2024-02-23 MED ORDER — ARMC OPHTHALMIC DILATING DROPS
OPHTHALMIC | Status: AC
  Filled 2024-02-23: qty 0.5

## 2024-02-23 MED ORDER — TETRACAINE HCL 0.5 % OP SOLN
OPHTHALMIC | Status: AC
  Filled 2024-02-23: qty 4

## 2024-02-23 MED ORDER — MOXIFLOXACIN HCL 0.5 % OP SOLN
OPHTHALMIC | Status: DC | PRN
Start: 2024-02-23 — End: 2024-02-23
  Administered 2024-02-23: .2 mL via OPHTHALMIC

## 2024-02-23 MED ORDER — ARMC OPHTHALMIC DILATING DROPS
1.0000 | OPHTHALMIC | Status: DC | PRN
  Administered 2024-02-23 (×3): 1 via OPHTHALMIC

## 2024-02-23 MED ORDER — MIDAZOLAM HCL 2 MG/2ML IJ SOLN: INTRAMUSCULAR | Status: DC | PRN

## 2024-02-23 MED ORDER — MIDAZOLAM HCL 2 MG/2ML IJ SOLN
INTRAMUSCULAR | Status: AC
  Filled 2024-02-23: qty 2

## 2024-02-23 MED ORDER — FENTANYL CITRATE (PF) 100 MCG/2ML IJ SOLN
INTRAMUSCULAR | Status: DC | PRN
Start: 2024-02-23 — End: 2024-02-23
  Administered 2024-02-23: 50 ug via INTRAVENOUS

## 2024-02-23 MED ORDER — BRIMONIDINE TARTRATE-TIMOLOL 0.2-0.5 % OP SOLN
OPHTHALMIC | Status: DC | PRN
  Administered 2024-02-23: 1 [drp] via OPHTHALMIC

## 2024-02-23 MED ORDER — FENTANYL CITRATE (PF) 100 MCG/2ML IJ SOLN
INTRAMUSCULAR | Status: AC
  Filled 2024-02-23: qty 2

## 2024-02-23 MED ORDER — MIDAZOLAM HCL 2 MG/2ML IJ SOLN
INTRAMUSCULAR | Status: AC
Start: 2024-02-23 — End: ?
  Filled 2024-02-23: qty 2

## 2024-02-23 MED ORDER — SIGHTPATH DOSE#1 BSS IO SOLN
INTRAOCULAR | Status: DC | PRN
  Administered 2024-02-23: 15 mL via INTRAOCULAR

## 2024-02-23 MED ORDER — TETRACAINE HCL 0.5 % OP SOLN
1.0000 [drp] | OPHTHALMIC | Status: DC | PRN
  Administered 2024-02-23 (×3): 1 [drp] via OPHTHALMIC

## 2024-02-23 MED ORDER — SIGHTPATH DOSE#1 BSS IO SOLN
INTRAOCULAR | Status: DC | PRN
  Administered 2024-02-23: 49 mL via OPHTHALMIC

## 2024-02-23 MED ORDER — SIGHTPATH DOSE#1 NA HYALUR & NA CHOND-NA HYALUR IO KIT
PACK | INTRAOCULAR | Status: DC | PRN
  Administered 2024-02-23: 1 via OPHTHALMIC

## 2024-02-23 MED ORDER — LIDOCAINE HCL (PF) 2 % IJ SOLN
INTRAOCULAR | Status: DC | PRN
  Administered 2024-02-23: 2 mL

## 2024-02-23 MED ORDER — FENTANYL CITRATE (PF) 100 MCG/2ML IJ SOLN: INTRAMUSCULAR | Status: DC | PRN

## 2024-02-23 MED ORDER — EPHEDRINE SULFATE (PRESSORS) 50 MG/ML IJ SOLN
INTRAMUSCULAR | Status: DC | PRN
  Administered 2024-02-23 (×2): 10 mg via INTRAVENOUS

## 2024-02-23 SURGICAL SUPPLY — 10 items
CATARACT SUITE SIGHTPATH (MISCELLANEOUS) ×1 IMPLANT
FEE CATARACT SUITE SIGHTPATH (MISCELLANEOUS) ×1 IMPLANT
GLOVE BIOGEL PI IND STRL 8 (GLOVE) ×1 IMPLANT
GLOVE SURG LX STRL 7.5 STRW (GLOVE) ×1 IMPLANT
GLOVE SURG PROTEXIS BL SZ6.5 (GLOVE) ×1 IMPLANT
GLOVE SURG SYN 6.5 PF PI BL (GLOVE) ×1 IMPLANT
LENS IOL TECNIS EYHANCE 21.0 (Intraocular Lens) IMPLANT
NDL FILTER BLUNT 18X1 1/2 (NEEDLE) ×1 IMPLANT
NEEDLE FILTER BLUNT 18X1 1/2 (NEEDLE) ×1 IMPLANT
SYR 3ML LL SCALE MARK (SYRINGE) ×1 IMPLANT

## 2024-02-23 NOTE — Transfer of Care (Signed)
 Immediate Anesthesia Transfer of Care Note  Patient: Akshita T [place]  Procedure(s) Performed: PHACOEMULSIFICATION, CATARACT, WITH IOL INSERTION 7.73 00:34.1 (Right: Eye)  Patient Location: PACU  Anesthesia Type: MAC  Level of Consciousness: awake, alert  and patient cooperative  Airway and Oxygen Therapy: Patient Spontanous Breathing and Patient connected to supplemental oxygen  Post-op Assessment: Post-op Vital signs reviewed, Patient's Cardiovascular Status Stable, Respiratory Function Stable, Patent Airway and No signs of Nausea or vomiting  Post-op Vital Signs: Reviewed and stable  Complications: No notable events documented.

## 2024-02-23 NOTE — H&P (Signed)
 [facility]   Primary Care Physician:  Sherlene Shams, MD Ophthalmologist: Dr. Lockie Mola  Pre-Procedure History & Physical: HPI:  Stacie Mcdonald is a 78 y.o. female here for ophthalmic surgery.   Past Medical History:  Diagnosis Date   Allergy    Arthritis    fingers, knees   Asthma    Breast Cancer 1991   R mastectomy. XRT , chemo   Breast cancer (HCC) 1991   RT MASTECTOMY   CAD (coronary artery disease) 2008   50% LAD occlusion 2009,  stent in mid LAD 2008 for 95%    Chicken pox    CKD stage 3a, GFR 45-59 ml/min (HCC)    Colon polyps 02/2008   by Colonoscopy, [place]   Diabetes mellitus without complication (HCC)    GERD (gastroesophageal reflux disease)    Heart disease    Hyperlipidemia    Hypertension    Myocardial infarction (HCC)    silent. 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   Sleep apnea    CPAP   Stable angina (HCC) 07/27/2019   Thoracic vertebral fracture (HCC) 04/05/2012   Urinary tract infection    Wears contact lenses    left only    Past Surgical History:  Procedure Laterality Date   BREAST BIOPSY     right breast reconstruction S/P mastectomy   BREAST SURGERY     right mastectomy   CARDIAC CATHETERIZATION  02/07/2024   with stent  [facility]   CATARACT EXTRACTION W/PHACO Left 05/01/2020   Procedure: CATARACT EXTRACTION PHACO AND INTRAOCULAR LENS PLACEMENT (IOC) LEFT 5.45  00:48.6  11.2%;  Surgeon: Lockie Mola, MD;  Location: [facility];  Service: Ophthalmology;  Laterality: Left;  sleep apnea   COLONOSCOPY WITH PROPOFOL N/A 08/18/2019   Procedure: COLONOSCOPY WITH PROPOFOL;  Surgeon: Midge Minium, MD;  Location: [facility];  Service:  Endoscopy;  Laterality: N/A;  sleep apnea   CORONARY ANGIOPLASTY WITH STENT PLACEMENT  11/2011   mid RCA 75% occlusion resolved to 0%, [place]   CORONARY STENT INTERVENTION N/A 04/07/2018   Procedure: CORONARY STENT INTERVENTION;  Surgeon: Alwyn Pea, MD;  Location: [facility] INVASIVE CV LAB;  Service: Cardiovascular;  Laterality: N/A;   CORONARY STENT INTERVENTION N/A 02/20/2021   Procedure: CORONARY STENT INTERVENTION;  Surgeon: Alwyn Pea, MD;  Location: [facility] INVAS[facility] CV [facility];  Service: Cardiovascular;  Laterality: N/A;   CORONARY STENT INTERVENTION N/A 06/25/2023   Procedure: CORONARY STENT INTERVENTION;  Surgeon: Alwyn Pea, MD;  Location: [facility] INVAS[facility] CV [facility];  Service: Cardiovascular;  Laterality: N/A;   LEFT HEART CATH AND CORONARY ANGIOGRAPHY N/A 02/20/2021   Procedure: LEFT HEART CATH AND CORONARY ANGIOGRAPHY;  Surgeon: Alwyn Pea, MD;  Location: [facility] INVASIVE CV [facility];  Service: Cardiovascular;  Laterality: N/A;   LEFT HEART CATH AND CORONARY ANGIOGRAPHY Left 06/25/2023   Procedure: LEFT HEART CATH AND CORONARY ANGIOGRAPHY;  Surgeon: Alwyn Pea, MD;  Location: [facility] INVASIVE CV LAB;  Service: Cardiovascular;  Laterality: Left;   MASTECTOMY Right 1991   BREAST CA   POLYPECTOMY  08/18/2019   Procedure: POLYPECTOMY;  Surgeon: Midge Minium, MD;  Location: [facility];  Service: Endoscopy;;   PSEUDOANERYSM COMPRESSION N/A 02/27/2021  Procedure: PSEUDOANERYSM COMPRESSION;  Surgeon: Renford Dills, MD;  Location: [facility] INV[facility] CV [facility];  Service: Cardiovascular;  Laterality: N/A;   RIGHT/LEFT HEART CATH AND CORONARY ANGIOGRAPHY N/A 04/07/2018   Procedure: RIGHT/LEFT HEART CATH AND CORONARY ANGIOGRAPHY;  Surgeon: Alwyn Pea, MD;  Location: [facility] IN[facility] CV [facility];  Service: Cardiovascular;  Laterality: N/A;   TONSILLECTOMY      Prior to Admission medications   Medication Sig Start Date End Date Taking? Authorizing Provider  albuterol  (VENTOLIN HFA) 108 (90 Base) MCG/ACT inhaler Inhale 2 puffs into the lungs every 6 (six) hours as needed. Using in Am daily  per MD 07/01/23 06/30/24 Yes [provider]  aspirin 81 MG tablet Take 81 mg by mouth daily.   Yes [provider]  clopidogrel (PLAVIX) 75 MG tablet Take 1 tablet (75 mg total) by mouth daily. 01/18/24  Yes Sherlene Shams, MD  D-MANNOSE PO Take 800 mg by mouth daily.   Yes [provider]  ELDERBERRY PO Take 1,000 mg by mouth daily.   Yes [provider]  empagliflozin (JARDIANCE) 10 MG TABS tablet Take 1 tablet (10 mg total) by mouth daily before breakfast. 01/18/24  Yes Sherlene Shams, MD  ezetimibe (ZETIA) 10 MG tablet Take 1 tablet (10 mg total) by mouth daily. 01/19/24  Yes Sherlene Shams, MD  furosemide (LASIX) 40 MG tablet Take 40 mg by mouth daily.   Yes [provider]  GLUCOSAMINE-CHONDROITIN PO Take 2 tablets by mouth daily.   Yes [provider]  isosorbide mononitrate (IMDUR) 120 MG 24 hr tablet Take 120 mg by mouth daily.    Yes [provider]  loratadine (CLARITIN) 10 MG tablet Take 10 mg by mouth daily as needed for allergies.   Yes [provider]  metoprolol (TOPROL-XL) 200 MG 24 hr tablet TAKE 1 TABLET BY MOUTH DAILY. 01/18/24  Yes Sherlene Shams, MD  pantoprazole (PROTONIX) 40 MG tablet Take 1 tablet (40 mg total) by mouth daily. 01/18/24  Yes Sherlene Shams, MD  PRALUENT 75 MG/ML SOAJ Inject 75 mg into the muscle every 14 (fourteen) days. 04/11/19  Yes [provider]  ranolazine (RANEXA) 500 MG 12 hr tablet Take 500 mg by mouth 2 (two) times daily.   Yes [provider]  sacubitril-valsartan (ENTRESTO) 49-51 MG Take 1 tablet by mouth 2 (two) times daily. 11/27/19  Yes [provider]  spironolactone (ALDACTONE) 25 MG tablet Take 1 tablet (25 mg total) by mouth daily. 08/17/23  Yes Sherlene Shams, MD  traZODone (DESYREL) 150 MG tablet Take 1 tablet (150 mg total)  by mouth at bedtime. 01/18/24  Yes Sherlene Shams, MD  nitroGLYCERIN (NITROSTAT) 0.4 MG SL tablet Place 0.4 mg under the tongue every 5 (five) minutes as needed for chest pain.    [provider]    Allergies as of 02/15/2024 - Review Complete 01/19/2024  Allergen Reaction Noted   Penicillins Hives 09/09/2015   Statins  04/05/2019    Family History  Problem Relation Age of Onset   Heart disease Mother 22       massive MI   Heart disease Father 46       AMI   Cancer Father        metastatic lung CA   Diabetes Sister    Cancer Maternal Aunt    Breast cancer Maternal Aunt 54   Breast cancer Cousin        74s  Breast cancer Cousin        76s    Social History   Socioeconomic History   Marital status: Married    Spouse name: Gabriel Carina    Number of children: 4   Years of education: bachelors   Highest education level: Not on file  Occupational History   Occupation: RETIRED  Tobacco Use   Smoking status: Never   Smokeless tobacco: Never  Vaping Use   Vaping status: Never Used  Substance and Sexual Activity   Alcohol use: Yes    Alcohol/week: 1.0 standard drink of alcohol    Types: 1 Glasses of wine per week    Comment: occasional wine   Drug use: No   Sexual activity: Yes  Other Topics Concern   Not on file  Social History Narrative   Not on file   Social Drivers of Health   Financial Resource Strain: Low Risk  (01/19/2024)   Received from [facility]   Overall Financial Resource Strain (CARDIA)    Difficulty of Paying Living Expenses: Not hard at all  Food Insecurity: No Food Insecurity (01/19/2024)   [facility]   Hunger Vital Sign    Worried About Running Out of Food in the Last Year: Never true    Ran Out of Food in the Last Year: Never true  Transportation Needs: No Transportation Needs (01/19/2024)   Received from [facility] - Transportation    In the past 12 months,  has lack of transportation kept you from medical appointments or from getting medications?: No    Lack of Transportation (Non-Medical): No  Physical Activity: Insufficiently Active (05/17/2023)   Exercise Vital Sign    Days of Exercise per Week: 3 days    Minutes of Exercise per Session: 30 min  Stress: No Stress Concern Present (05/17/2023)   [employer] of Occupational Health - Occupational Stress Questionnaire    Feeling of Stress : Only a little  Social Connections: Socially Integrated (05/17/2023)   Social Connection and Isolation Panel [NHANES]    Frequency of Communication with Friends and Family: More than three times a week    Frequency of Social Gatherings with Friends and Family: More than three times a week    Attends Religious Services: More than 4 times per year    Active Member of [employer] or Organizations: Yes    Attends Engineer, structural: More than 4 times per year    Marital Status: Married  Catering manager Violence: Not At Risk (08/07/2023)   Humiliation, Afraid, Rape, and Kick questionnaire    Fear of Current or Ex-Partner: No    Emotionally Abused: No    Physically Abused: No    Sexually Abused: No    Review of Systems: See HPI, otherwise negative ROS  Physical Exam: Ht 5\' 4"  (1.626 m)   Wt 80.7 kg   BMI 30.55 kg/m  General:   Alert,  pleasant and cooperative in NAD Head:  Normocephalic and atraumatic. Lungs:  Clear to auscultation.    Heart:  Regular rate and rhythm.   Impression/Plan: Stacie Mcdonald is here for ophthalmic surgery.  Risks, benefits, limitations, and alternatives regarding ophthalmic surgery have been reviewed with the patient.  Questions have been answered.  All parties agreeable.   Lockie Mola, MD  02/23/2024, 9:03 AM

## 2024-02-23 NOTE — Op Note (Signed)
,  LOCATION:  [facility]   PREOPERATIVE DIAGNOSIS:    Nuclear sclerotic cataract right eye. H25.11   POSTOPERATIVE DIAGNOSIS:  Nuclear sclerotic cataract right eye.     PROCEDURE:  Phacoemusification with posterior chamber intraocular lens placement of the right eye   ULTRASOUND TIME: Procedure(s): PHACOEMULSIFICATION, CATARACT, WITH IOL INSERTION 7.73 00:34.1 (Right)  LENS:   Implant Name Type Inv. Item Serial No. Manufacturer Lot No. LRB No. Used Action  LENS IOL TECNIS EYHANCE 21.0 - [id number] Intraocular Lens LENS IOL TECNIS EYHANCE 21.0 [id number] [employer]  Right 1 Implanted         SURGEON:  Deirdre Evener, MD   ANESTHESIA:  Topical with tetracaine drops and 2% Xylocaine jelly, augmented with 1% preservative-free intracameral lidocaine.    COMPLICATIONS:  None.   DESCRIPTION OF PROCEDURE:  The patient was identified in the holding room and transported to the operating room and placed in the supine position under the operating microscope.  The right eye was identified as the operative eye and it was prepped and draped in the usual sterile ophthalmic fashion.   A 1 millimeter clear-corneal paracentesis was made at the 12:00 position.  0.5 ml of preservative-free 1% lidocaine was injected into the anterior chamber. The anterior chamber was filled with Viscoat viscoelastic.  A 2.4 millimeter keratome was used to make a near-clear corneal incision at the 9:00 position.  A curvilinear capsulorrhexis was made with a cystotome and capsulorrhexis forceps.  Balanced salt solution was used to hydrodissect and hydrodelineate the nucleus.   Phacoemulsification was then used in stop and chop fashion to remove the lens nucleus and epinucleus.  The remaining cortex was then removed using the irrigation and aspiration handpiece. Provisc was then placed into the capsular bag to distend it for lens placement.  A lens was then injected into the capsular bag.  The remaining  viscoelastic was aspirated.   Wounds were hydrated with balanced salt solution.  The anterior chamber was inflated to a physiologic pressure with balanced salt solution.  No wound leaks were noted. Vigamox 0.2 ml of a 1mg  per ml solution was injected into the anterior chamber for a dose of 0.2 mg of intracameral antibiotic at the completion of the case.   Timolol and Brimonidine drops were applied to the eye.  The patient was taken to the recovery room in stable condition without complications of anesthesia or surgery.   Saylor Murry 02/23/2024, 10:06 AM

## 2024-02-23 NOTE — Anesthesia Postprocedure Evaluation (Signed)
 Anesthesia Post Note  Patient: Clyde T [place]  Procedure(s) Performed: PHACOEMULSIFICATION, CATARACT, WITH IOL INSERTION 7.73 00:34.1 (Right: Eye)  Patient location during evaluation: PACU Anesthesia Type: MAC Level of consciousness: awake and alert Pain management: pain level controlled Vital Signs Assessment: post-procedure vital signs reviewed and stable Respiratory status: spontaneous breathing, nonlabored ventilation, respiratory function stable and patient connected to nasal cannula oxygen Cardiovascular status: stable and blood pressure returned to baseline Postop Assessment: no apparent nausea or vomiting Anesthetic complications: no   No notable events documented.   Last Vitals:  Vitals:   02/23/24 1010 02/23/24 1015  BP: (!) 99/52 98/60  Pulse: 77 78  Resp: (!) 23 18  Temp:  36.4 C  SpO2: 92% 94%    Last Pain:  Vitals:   02/23/24 1015  TempSrc:   PainSc: 0-No pain                 Cavan Bearden C Marisa Hufstetler

## 2024-02-29 DIAGNOSIS — G4733 Obstructive sleep apnea (adult) (pediatric): Secondary | ICD-10-CM | POA: Diagnosis not present

## 2024-04-20 ENCOUNTER — Ambulatory Visit: Admitting: Internal Medicine

## 2024-04-26 ENCOUNTER — Encounter: Payer: Self-pay | Admitting: Internal Medicine

## 2024-04-26 ENCOUNTER — Ambulatory Visit: Admitting: Internal Medicine

## 2024-04-26 VITALS — BP 120/72 | HR 84 | Ht 64.0 in | Wt 176.6 lb

## 2024-04-26 DIAGNOSIS — E785 Hyperlipidemia, unspecified: Secondary | ICD-10-CM | POA: Diagnosis not present

## 2024-04-26 DIAGNOSIS — E1159 Type 2 diabetes mellitus with other circulatory complications: Secondary | ICD-10-CM | POA: Diagnosis not present

## 2024-04-26 DIAGNOSIS — I10 Essential (primary) hypertension: Secondary | ICD-10-CM | POA: Diagnosis not present

## 2024-04-26 DIAGNOSIS — Z955 Presence of coronary angioplasty implant and graft: Secondary | ICD-10-CM

## 2024-04-26 DIAGNOSIS — I2510 Atherosclerotic heart disease of native coronary artery without angina pectoris: Secondary | ICD-10-CM | POA: Diagnosis not present

## 2024-04-26 DIAGNOSIS — E1169 Type 2 diabetes mellitus with other specified complication: Secondary | ICD-10-CM

## 2024-04-26 DIAGNOSIS — N1831 Chronic kidney disease, stage 3a: Secondary | ICD-10-CM

## 2024-04-26 DIAGNOSIS — Z7984 Long term (current) use of oral hypoglycemic drugs: Secondary | ICD-10-CM

## 2024-04-26 MED ORDER — SPIRONOLACTONE 25 MG PO TABS: 25.0000 mg | ORAL_TABLET | Freq: Every day | ORAL | 1 refills | Status: DC

## 2024-04-26 MED ORDER — FREESTYLE LANCETS MISC
3 refills | Status: AC
Start: 2024-04-26 — End: ?

## 2024-04-26 NOTE — Patient Instructions (Signed)
 I have refilled the spironolactone  and the lancets for you  Goal for Fasting sugars should be < 130 and for post prandials > 160

## 2024-04-26 NOTE — Progress Notes (Signed)
 Subjective:  Patient ID: Stacie Mcdonald, female    DOB: 1946-07-22  Age: 78 y.o. MRN: [id number]  CC: The primary encounter diagnosis was Essential hypertension. 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   HPI Stacie Mcdonald presents for  Chief Complaint  Patient presents with   Medical Management of Chronic Issues    3 month follow up    Since her last visit [name] has undergone   1) Cataract surgery  right eye by [facility].  She is l disappointed that her vision has not  improved   Has appt with [facility] in August   2)  stent placement in March in RCA by Dr Rochelle Chu at [facility] due to severe angina .  Pain free now ,  has not resumed cardiac rehab .  But she is walking three times a week on her own without chest pain . Some exertional dyspnea continues to occur whe walking  the length of her house  or going up a flight of stairs.  .  Has right diaphragam paralysis ,  sees [facility]   3)  HLD :  she is statin and zetia  intolerant .  Taking praluent   4) Type 2 DM:  She  feels generally well,  But is not  exercising regularly except for walking . Has not checked   blood sugars in the last several months due  to loss of supplies.  Denies any recent hypoglyemic events.  Taking   medications as directed. Following a carbohydrate modified diet 6 days per week. Denies numbness, burning and tingling of extremities. Appetite is good.    Outpatient Medications Prior to Visit  Medication Sig Dispense Refill   albuterol  (VENTOLIN  HFA) 108 (90 Base) MCG/ACT inhaler Inhale 2 puffs into the lungs every 6 (six) hours as needed. Using in Am daily  per  MD     aspirin  81 MG tablet Take 81 mg by mouth daily.     clopidogrel  (PLAVIX ) 75 MG tablet Take 1 tablet (75 mg total) by mouth daily. 90 tablet 1   D-MANNOSE PO Take 800 mg by mouth daily.     ELDERBERRY PO Take 1,000 mg by mouth daily.     furosemide  (LASIX ) 40 MG tablet Take 40 mg by mouth daily.     GLUCOSAMINE-CHONDROITIN PO Take 2 tablets by mouth daily.     isosorbide  mononitrate (IMDUR ) 120 MG 24 hr tablet Take 120 mg by mouth daily.      loratadine  (CLARITIN ) 10 MG tablet Take 10 mg by mouth daily as needed for allergies.     metoprolol  (TOPROL -XL) 200 MG 24 hr tablet TAKE 1 TABLET BY MOUTH DAILY. 90 tablet 1   nitroGLYCERIN  (NITROSTAT ) 0.4 MG SL tablet Place 0.4 mg under the tongue every 5 (five) minutes as needed for chest pain.     pantoprazole  (PROTONIX ) 40 MG tablet Take 1 tablet (40 mg total) by mouth daily. 90 tablet 1   PRALUENT 75 MG/ML SOAJ Inject 75 mg into the muscle every 14 (fourteen) days.     ranolazine  (RANEXA ) 500 MG 12 hr tablet Take 500 mg by mouth 2 (two) times daily.     sacubitril -valsartan  (ENTRESTO ) 49-51  MG Take 1 tablet by mouth 2 (two) times daily.     traZODone  (DESYREL ) 150 MG tablet Take 1 tablet (150 mg total) by mouth at bedtime. 90 tablet 0   empagliflozin  (JARDIANCE ) 10 MG TABS tablet Take 1 tablet (10 mg total) by mouth daily before breakfast. 90 tablet 2   ezetimibe  (ZETIA ) 10 MG tablet Take 1 tablet (10 mg total) by mouth daily. 90 tablet 3   spironolactone  (ALDACTONE ) 25 MG tablet Take 1 tablet (25 mg total) by mouth daily.     No facility-administered medications prior to visit.    Review of Systems;  Patient denies headache, fevers, malaise, unintentional weight loss, skin rash, eye pain, sinus congestion and sinus pain, sore throat, dysphagia,  hemoptysis , cough, dyspnea, wheezing, chest pain, palpitations, orthopnea, edema, abdominal pain, nausea, melena, diarrhea, constipation, flank pain, dysuria, hematuria, urinary  Frequency,  nocturia, numbness, tingling, seizures,  Focal weakness, Loss of consciousness,  Tremor, insomnia, depression, anxiety, and suicidal ideation.      Objective:  BP 120/72   Pulse 84   Ht 5' 4 (1.626 m)   Wt 176 lb 9.6 oz (80.1 kg)   SpO2 95%   BMI 30.31 kg/m   BP Readings from Last 3 Encounters:  04/26/24 120/72  02/23/24 98/60  01/18/24 108/62    Wt Readings from Last 3 Encounters:  04/26/24 176 lb 9.6 oz (80.1 kg)  02/23/24 163 lb (73.9 kg)  01/18/24 175 lb 12.8 oz (79.7 kg)    Physical Exam Vitals reviewed.  Constitutional:      General: She is not in acute distress.    Appearance: Normal appearance. She is normal weight. She is not ill-appearing, toxic-appearing or diaphoretic.  HENT:     Head: Normocephalic.   Eyes:     General: No scleral icterus.       Right eye: No discharge.        Left eye: No discharge.     Conjunctiva/sclera: Conjunctivae normal.    Cardiovascular:     Rate and Rhythm: Normal rate and regular rhythm.     Heart sounds: Normal heart sounds.  Pulmonary:     Effort: Pulmonary effort is normal. No respiratory distress.     Breath sounds: Normal breath sounds.   Musculoskeletal:        General: Normal range of motion.   Skin:    General: Skin is warm and dry.   Neurological:     General: No focal deficit present.     Mental Status: She is alert and oriented to person, place, and time. Mental status is at baseline.   Psychiatric:        Mood and Affect: Mood normal.        Behavior: Behavior normal.        Thought Content: Thought content normal.        Judgment: Judgment normal.     Lab Results  Component Value Date   HGBA1C 6.2 04/26/2024   HGBA1C 6.5 01/18/2024   HGBA1C 6.0 08/09/2023    Lab Results  Component Value Date   CREATININE 1.15 04/26/2024   CREATININE 1.12 01/18/2024   CREATININE 1.02 09/08/2023    Lab Results  Component Value Date   WBC 6.6 08/17/2023   HGB 13.5 08/17/2023   HCT 41.7 08/17/2023    PLT 288.0 08/17/2023   GLUCOSE 109 (H) 04/26/2024   CHOL 132 04/26/2024   TRIG 187.0 (H) 04/26/2024   HDL 37.70 (L) 04/26/2024   LDLDIRECT  75.0 04/26/2024   LDLCALC 57 04/26/2024   ALT 15 04/26/2024   AST 13 04/26/2024   NA 138 04/26/2024   K 4.4 04/26/2024   CL 99 04/26/2024   CREATININE 1.15 04/26/2024   BUN 24 (H) 04/26/2024   CO2 29 04/26/2024   TSH 3.11 08/17/2023   INR 1.0 08/07/2023   HGBA1C 6.2 04/26/2024   MICROALBUR 0.8 01/18/2024    No results found.  Assessment & Plan:  .Essential hypertension -     Comprehensive metabolic panel with GFR  Hyperlipidemia associated with type 2 diabetes mellitus (HCC) -     Lipid panel -     LDL cholesterol, direct  Type 2 diabetes mellitus with hyperlipidemia (HCC) -     Comprehensive metabolic panel with GFR -     Hemoglobin A1c  Coronary artery disease involving native coronary artery of native heart without angina pectoris Assessment & Plan: She received a second stent in the RCA In March 2025 (first one mplaced in mid RCA Sept 2024) after presenting to Dr Beau Bound with US . na.  Continue Ranexa , Praluent. Plavix  and ASA . Increase jardiance  to 25 mg    Coronary artery disease due to type 2 diabetes mellitus [facility]) Assessment & Plan: With recent [facility] admission for [place]  in March 2025 resulting in stent placement in RCA. she is taking praluent.;  will increase Jardiance  to 25 mg .  A1c and LDL are at  goal     Lab Results  Component Value Date   HGBA1C 6.2 04/26/2024    Lab Results  Component Value Date   MICROALBUR 0.8 01/18/2024   MICROALBUR <0.7 08/17/2023      Lab Results  Component Value Date   CHOL 132 04/26/2024   HDL 37.70 (L) 04/26/2024   LDLCALC 57 04/26/2024   LDLDIRECT 75.0 04/26/2024   TRIG 187.0 (H) 04/26/2024   CHOLHDL 4 04/26/2024      S/P drug eluting coronary stent placement Assessment & Plan: PTCA/stent x 2 of RCA in April 2022 .  In stent restenosis noted in Sept 2024 .   Another  stent was placed in RCA March 2025 at [facility]   Stage 3a chronic kidney disease [facility]) Assessment & Plan: GFR has been > 60 ml/min since October 2024.  Given her normal urine microalb/cr ratio, and rmultiple cardiac catheterizations ,  ddx includes contrast induced nephropathy . Increase jardianceto 25 mg and refer to nephrology   Lab Results  Component Value Date   MICROALBUR 0.8 01/18/2024   MICROALBUR <0.7 08/17/2023     Lab Results  Component Value Date   CREATININE 1.15 04/26/2024      Other orders -     Spironolactone ; Take 1 tablet (25 mg total) by mouth daily.  Dispense: 90 tablet; Refill: 1 -     FreeStyle Lancets; To check blood sugars once daily  Dispense: 100 each; Refill: 3 -     Empagliflozin ; Take 1 tablet (25 mg total) by mouth daily before breakfast.  Dispense: 30 tablet; Refill: 0     I spent 34 minutes on the day of this face to face encounter reviewing patient's  most recent visit with cardiology,    prior relevant surgical and non surgical procedures, recent  labs and imaging studies, counseling on diabetes  management,  reviewing the assessment and plan with patient, and post visit ordering and reviewing of  diagnostics and therapeutics with patient  .   Follow-up: No follow-ups on file.   Ray Caffey  Madelon Scheuermann, MD

## 2024-04-27 ENCOUNTER — Ambulatory Visit: Payer: Self-pay | Admitting: Internal Medicine

## 2024-04-27 DIAGNOSIS — N183 Chronic kidney disease, stage 3 unspecified: Secondary | ICD-10-CM | POA: Insufficient documentation

## 2024-04-27 LAB — COMPREHENSIVE METABOLIC PANEL WITH GFR
ALT: 15 U/L (ref 0–35)
AST: 13 U/L (ref 0–37)
Albumin: 4.8 g/dL (ref 3.5–5.2)
Alkaline Phosphatase: 43 U/L (ref 39–117)
BUN: 24 mg/dL — ABNORMAL HIGH (ref 6–23)
CO2: 29 meq/L (ref 19–32)
Calcium: 10.1 mg/dL (ref 8.4–10.5)
Chloride: 99 meq/L (ref 96–112)
Creatinine, Ser: 1.15 mg/dL (ref 0.40–1.20)
GFR: 45.37 mL/min — ABNORMAL LOW (ref >60.00)
Glucose, Bld: 109 mg/dL — ABNORMAL HIGH (ref 70–99)
Potassium: 4.4 meq/L (ref 3.5–5.1)
Sodium: 138 meq/L (ref 135–145)
Total Bilirubin: 0.6 mg/dL (ref 0.2–1.2)
Total Protein: 7.5 g/dL (ref 6.0–8.3)

## 2024-04-27 LAB — LDL CHOLESTEROL, DIRECT: Direct LDL: 75 mg/dL

## 2024-04-27 LAB — LIPID PANEL
Cholesterol: 132 mg/dL (ref 0–200)
HDL: 37.7 mg/dL — ABNORMAL LOW (ref >39.00)
LDL Cholesterol: 57 mg/dL (ref 0–99)
NonHDL: 94.36
Total CHOL/HDL Ratio: 4
Triglycerides: 187 mg/dL — ABNORMAL HIGH (ref 0.0–149.0)
VLDL: 37.4 mg/dL (ref 0.0–40.0)

## 2024-04-27 LAB — HEMOGLOBIN A1C: Hgb A1c MFr Bld: 6.2 % (ref 4.6–6.5)

## 2024-04-27 MED ORDER — EMPAGLIFLOZIN 25 MG PO TABS: 25.0000 mg | ORAL_TABLET | Freq: Every day | ORAL | 0 refills | Status: DC

## 2024-04-27 NOTE — Assessment & Plan Note (Signed)
 GFR has been > 60 ml/min since October 2024.  Given her normal urine microalb/cr ratio, and rmultiple cardiac catheterizations ,  ddx includes contrast induced nephropathy . Increase jardianceto 25 mg and refer to nephrology   Lab Results  Component Value Date   MICROALBUR 0.8 01/18/2024   MICROALBUR <0.7 08/17/2023     Lab Results  Component Value Date   CREATININE 1.15 04/26/2024

## 2024-04-27 NOTE — Assessment & Plan Note (Addendum)
 She received a second stent in the RCA In March 2025 (first one mplaced in mid RCA Sept 2024) after presenting to Dr Beau Bound with US . na.  Continue Ranexa , Praluent. Plavix  and ASA . Increase jardiance  to 25 mg

## 2024-04-27 NOTE — Assessment & Plan Note (Addendum)
 With recent [facility] admission for [place]  in March 2025 resulting in stent placement in RCA. she is taking praluent.;  will increase Jardiance  to 25 mg .  A1c and LDL are at  goal     Lab Results  Component Value Date   HGBA1C 6.2 04/26/2024    Lab Results  Component Value Date   MICROALBUR 0.8 01/18/2024   MICROALBUR <0.7 08/17/2023      Lab Results  Component Value Date   CHOL 132 04/26/2024   HDL 37.70 (L) 04/26/2024   LDLCALC 57 04/26/2024   LDLDIRECT 75.0 04/26/2024   TRIG 187.0 (H) 04/26/2024   CHOLHDL 4 04/26/2024

## 2024-04-27 NOTE — Assessment & Plan Note (Signed)
 PTCA/stent x 2 of RCA in April 2022 .  In stent restenosis noted in Sept 2024 .   Another stent was placed in RCA March 2025 at [facility]

## 2024-05-04 ENCOUNTER — Telehealth: Payer: Self-pay

## 2024-05-04 NOTE — Telephone Encounter (Signed)
 Copied from CRM [id number]. Topic: Clinical - Prescription Issue >> May 04, 2024 12:48 PM Shereese L wrote: Reason for CRM: [facility] in and stated Patient was prescribed sacubitril -valsartan  (ENTRESTO ) 49-51 MG which is an interaction with the medication spironolactone  (ALDACTONE ) 25 MG tablet that Dr. Madelon Scheuermann prescribed and the pharmacy would like a call back to verify medication [phone]

## 2024-05-05 DIAGNOSIS — D225 Melanocytic nevi of trunk: Secondary | ICD-10-CM | POA: Diagnosis not present

## 2024-05-05 DIAGNOSIS — C44619 Basal cell carcinoma of skin of left upper limb, including shoulder: Secondary | ICD-10-CM | POA: Diagnosis not present

## 2024-05-05 DIAGNOSIS — D0439 Carcinoma in situ of skin of other parts of face: Secondary | ICD-10-CM | POA: Diagnosis not present

## 2024-05-05 DIAGNOSIS — D2261 Melanocytic nevi of right upper limb, including shoulder: Secondary | ICD-10-CM | POA: Diagnosis not present

## 2024-05-05 DIAGNOSIS — C049 Malignant neoplasm of floor of mouth, unspecified: Secondary | ICD-10-CM | POA: Diagnosis not present

## 2024-05-05 DIAGNOSIS — D2262 Melanocytic nevi of left upper limb, including shoulder: Secondary | ICD-10-CM | POA: Diagnosis not present

## 2024-05-05 DIAGNOSIS — D2272 Melanocytic nevi of left lower limb, including hip: Secondary | ICD-10-CM | POA: Diagnosis not present

## 2024-05-05 DIAGNOSIS — L570 Actinic keratosis: Secondary | ICD-10-CM | POA: Diagnosis not present

## 2024-05-05 DIAGNOSIS — D485 Neoplasm of uncertain behavior of skin: Secondary | ICD-10-CM | POA: Diagnosis not present

## 2024-05-05 NOTE — Telephone Encounter (Signed)
 Pharmacy is aware

## 2024-05-08 ENCOUNTER — Other Ambulatory Visit: Payer: Self-pay

## 2024-05-08 MED ORDER — CONTOUR NEXT TEST VI STRP: ORAL_STRIP | 12 refills | Status: AC

## 2024-05-12 ENCOUNTER — Other Ambulatory Visit: Payer: Self-pay | Admitting: Internal Medicine

## 2024-05-15 ENCOUNTER — Other Ambulatory Visit: Payer: Self-pay | Admitting: Internal Medicine

## 2024-05-17 ENCOUNTER — Ambulatory Visit: Payer: Medicare Other | Admitting: *Deleted

## 2024-05-17 VITALS — Ht 64.0 in | Wt 178.0 lb

## 2024-05-17 DIAGNOSIS — Z0000 Encounter for general adult medical examination without abnormal findings: Secondary | ICD-10-CM | POA: Diagnosis not present

## 2024-05-17 DIAGNOSIS — Z780 Asymptomatic menopausal state: Secondary | ICD-10-CM | POA: Diagnosis not present

## 2024-05-17 NOTE — Telephone Encounter (Signed)
 Duplicate request. Please refuse the refill request.

## 2024-05-17 NOTE — Progress Notes (Signed)
 Subjective:   Stacie Mcdonald is a 78 y.o. who presents for a Medicare Wellness preventive visit.  As a reminder, Annual Wellness Visits don't include a physical exam, and some assessments may be limited, especially if this visit is performed virtually. We may recommend an in-person follow-up visit with your provider if needed.  Visit Complete: Virtual I connected with  Stacie Mcdonald on 05/17/24 by a audio enabled telemedicine application and verified that I am speaking with the correct person using two identifiers.  Patient Location: Home  Provider Location: Home Office  I discussed the limitations of evaluation and management by telemedicine. The patient expressed understanding and agreed to proceed.  Vital Signs: Because this visit was a virtual/telehealth visit, some criteria may be missing or patient reported. Any vitals not documented were not able to be obtained and vitals that have been documented are patient reported.  VideoDeclined- This patient declined Librarian, academic. Therefore the visit was completed with audio only.  Persons Participating in Visit: Patient.  AWV Questionnaire: Yes: Patient Medicare AWV questionnaire was completed by the patient on 05/16/24; I have confirmed that all information answered by patient is correct and no changes since this date.  Cardiac Risk Factors include: advanced age (>43men, >55 women);diabetes mellitus;dyslipidemia;obesity (BMI >30kg/m2);hypertension     Objective:    Today's Vitals   05/17/24 0814  Weight: 178 lb (80.7 kg)  Height: 5' 4 (1.626 m)   Body mass index is 30.55 kg/m.     05/17/2024    8:32 AM 02/23/2024    8:58 AM 09/18/2023   10:22 AM 08/07/2023    9:00 AM 07/13/2023    1:15 PM 06/25/2023    5:32 PM 06/25/2023   10:54 AM  Advanced Directives  Does Patient Have a Medical Advance Directive? Yes Yes No No Yes Yes Yes  Type of Estate agent of [place];Living will  Healthcare Power of [place];Living will   Healthcare Power of [place];Living will Healthcare Power of [employer] of [place];Living will  Does patient want to make changes to medical advance directive?  No - Patient declined    No - Patient declined   Copy of Healthcare Power of Attorney in Chart? No - copy requested Yes - validated most recent copy scanned in chart (See row information)    No - copy requested No - copy requested  Would patient like information on creating a medical advance directive?    No - Patient declined       Current Medications (verified) Outpatient Encounter Medications as of 05/17/2024  Medication Sig   albuterol  (VENTOLIN  HFA) 108 (90 Base) MCG/ACT inhaler Inhale 2 puffs into the lungs every 6 (six) hours as needed. Using in Am daily  per MD   aspirin  81 MG tablet Take 81 mg by mouth daily.   clopidogrel  (PLAVIX ) 75 MG tablet Take 1 tablet (75 mg total) by mouth daily.   D-MANNOSE PO Take 800 mg by mouth daily.   ELDERBERRY PO Take 1,000 mg by mouth daily.   empagliflozin  (JARDIANCE ) 25 MG TABS tablet Take 1 tablet (25 mg total) by mouth daily before breakfast.   furosemide  (LASIX ) 40 MG tablet Take 40 mg by mouth daily.   GLUCOSAMINE-CHONDROITIN PO Take 2 tablets by mouth daily.   glucose blood (CONTOUR NEXT TEST) test strip Use to check blood sugar twice daily.   isosorbide  mononitrate (IMDUR ) 120 MG 24 hr tablet Take 120 mg by mouth daily.  Lancets (FREESTYLE) lancets To check blood sugars once daily   loratadine  (CLARITIN ) 10 MG tablet Take 10 mg by mouth daily as needed for allergies.   metoprolol  (TOPROL -XL) 200 MG 24 hr tablet TAKE 1 TABLET BY MOUTH DAILY.   nitroGLYCERIN  (NITROSTAT ) 0.4 MG SL tablet Place 0.4 mg under the tongue every 5 (five) minutes as needed for chest pain.   pantoprazole  (PROTONIX ) 40 MG tablet Take 1 tablet (40 mg total) by mouth daily.   PRALUENT 75 MG/ML SOAJ Inject 75 mg into the muscle every 14 (fourteen) days.    ranolazine  (RANEXA ) 500 MG 12 hr tablet Take 500 mg by mouth 2 (two) times daily.   sacubitril -valsartan  (ENTRESTO ) 49-51 MG Take 1 tablet by mouth 2 (two) times daily.   spironolactone  (ALDACTONE ) 25 MG tablet Take 1 tablet (25 mg total) by mouth daily.   traZODone  (DESYREL ) 150 MG tablet Take 1 tablet (150 mg total) by mouth at bedtime.   No facility-administered encounter medications on file as of 05/17/2024.    Allergies (verified) Penicillins and Statins   History: Past Medical History:  Diagnosis Date   Allergy    Arthritis    fingers, knees   Asthma    Breast Cancer 1991   R mastectomy. XRT , chemo   Breast cancer (HCC) 1991   RT MASTECTOMY   CAD (coronary artery disease) 2008   50% LAD occlusion 2009,  stent in mid LAD 2008 for 95%    Chicken pox    CKD stage 3a, GFR 45-59 ml/min (HCC)    Colon polyps 02/2008   by Colonoscopy, [place]   Diabetes mellitus without complication (HCC)    GERD (gastroesophageal reflux disease)    Heart disease    Hyperlipidemia    Hypertension    Myocardial infarction (HCC)    silent. 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   Sleep apnea    CPAP   Stable angina (HCC) 07/27/2019   Thoracic vertebral fracture (HCC) 04/05/2012   Urinary tract infection    Wears contact lenses    left only   Past Surgical History:  Procedure Laterality Date   BREAST BIOPSY     right breast reconstruction S/P mastectomy   BREAST SURGERY     right mastectomy   CARDIAC CATHETERIZATION  02/07/2024   with stent  [facility]   CATARACT EXTRACTION W/PHACO Left 05/01/2020   Procedure: CATARACT EXTRACTION PHACO AND INTRAOCULAR LENS PLACEMENT (IOC) LEFT 5.45  00:48.6  11.2%;  Surgeon: Mittie Gaskin, MD;  Location[facility];  Service: Ophthalmology;  Laterality: Left;  sleep apnea   CATARACT EXTRACTION W/PHACO Right 02/23/2024   Procedure: PHACOEMULSIFICATION, CATARACT, WITH IOL INSERTION 7.73 00:34.1;  Surgeon: Mittie Gaskin, MD;  Location: [facility];  Service: Ophthalmology;  Laterality: Right;   COLONOSCOPY WITH PROPOFOL  N/A 08/18/2019   Procedure: COLONOSCOPY WITH PROPOFOL ;  Surgeon: Jinny Carmine, MD;  Location: [facility];  Service: Endoscopy;  Laterality: N/A;  sleep apnea   CORONARY ANGIOPLASTY WITH STENT PLACEMENT  11/2011   mid RCA 75% occlusion resolved to 0%, [place]   CORONARY STENT INTERVENTION N/A 04/07/2018   Procedure: CORONARY STENT INTERVENTION;  Surgeon: Florencio Cara BIRCH, MD;  Location: [facility];  Service: Cardiovascular;  Laterality: N/A;   CORONARY STENT INTERVENTION N/A 02/20/2021   Procedure: CORONARY STENT INTERVENTION;  Surgeon: Florencio Cara BIRCH, MD;  Location: [facility];  Service: Cardiovascular;  Laterality: N/A;   CORONARY STENT INTERVENTION N/A 06/25/2023   Procedure: CORONARY STENT INTERVENTION;  Surgeon: Florencio Cara BIRCH, MD;  Location: [facility];  Service: Cardiovascular;  Laterality: N/A;   LEFT HEART CATH AND CORONARY ANGIOGRAPHY N/A 02/20/2021   Procedure: LEFT HEART CATH AND CORONARY ANGIOGRAPHY;  Surgeon: Florencio Cara BIRCH, MD;  Location: [facility];  Service: Cardiovascular;  Laterality: N/A;   LEFT HEART CATH AND CORONARY ANGIOGRAPHY Left 06/25/2023   Procedure: LEFT HEART CATH AND CORONARY ANGIOGRAPHY;  Surgeon: Florencio Cara BIRCH, MD;  Location: [facility];  Service: Cardiovascular;  Laterality: Left;   MASTECTOMY Right 1991   BREAST CA   POLYPECTOMY  08/18/2019   Procedure: POLYPECTOMY;  Surgeon: Jinny Carmine, MD;  Location: [facility];  Service: Endoscopy;;   PSEUDOANERYSM COMPRESSION N/A 02/27/2021   Procedure: CLARK  COMPRESSION;  Surgeon: Jama Cordella MATSU, MD;  Location: [facility];  Service: Cardiovascular;  Laterality: N/A;   RIGHT/LEFT HEART CATH AND CORONARY ANGIOGRAPHY N/A 04/07/2018   Procedure: RIGHT/LEFT HEART CATH AND CORONARY ANGIOGRAPHY;  Surgeon: Florencio Cara BIRCH, MD;  Location: [facility];  Service: Cardiovascular;  Laterality: N/A;   TONSILLECTOMY     Family History  Problem Relation Age of Onset   Heart disease Mother 11       massive MI   Heart disease Father 66       AMI   Cancer Father        metastatic lung CA   Diabetes Sister    Cancer Maternal Aunt    Breast cancer Maternal Aunt 35   Breast cancer Cousin        19s   Breast cancer Cousin        50s   Social History   Socioeconomic History   Marital status: Married    Spouse name: Zachary Redbird    Number of children: 4   Years of education: bachelors   Highest education level: Bachelor's degree (e.g., BA, AB, BS)  Occupational History   Occupation: RETIRED  Tobacco Use   Smoking status: Never   Smokeless tobacco: Never  Vaping Use   Vaping status: Never Used  Substance and Sexual Activity   Alcohol use: Yes    Alcohol/week: 1.0 standard drink of alcohol    Types: 1 Glasses of wine per week    Comment: occasional wine   Drug use: No   Sexual activity: Yes  Other Topics Concern   Not on file  Social History Narrative   married   Social Drivers of Corporate investment banker Strain: Low Risk  (05/17/2024)   Overall Financial Resource Strain (CARDIA)    Difficulty of Paying Living Expenses: Not hard at all  Food Insecurity: No Food Insecurity (05/16/2024)   Hunger Vital Sign    Worried About Running Out of Food in the Last Year: Never true    Ran Out of Food in the Last Year: Never true  Transportation Needs: No Transportation Needs (05/16/2024)   PRAPARE - Administrator, Civil Service (Medical): No    Lack of Transportation (Non-Medical): No  Physical Activity:  Insufficiently Active (05/16/2024)   Exercise Vital Sign    Days of Exercise per Week: 3 days  Minutes of Exercise per Session: 30 min  Stress: No Stress Concern Present (05/16/2024)   [employer] of Occupational Health - Occupational Stress Questionnaire    Feeling of Stress: Only a little  Social Connections: Socially Integrated (05/16/2024)   Social Connection and Isolation Panel    Frequency of Communication with Friends and Family: More than three times a week    Frequency of Social Gatherings with Friends and Family: Twice a week    Attends Religious Services: More than 4 times per year    Active Member of [employer] or Organizations: Yes    Attends Banker Meetings: 1 to 4 times per year    Marital Status: Married    Tobacco Counseling Counseling given: Not Answered    Clinical Intake:  Pre-visit preparation completed: Yes  Pain : No/denies pain     BMI - recorded: 30.55 Nutritional Status: BMI > 30  Obese Nutritional Risks: None Diabetes: Yes CBG done?: No Did pt. bring in CBG monitor from home?: No  Lab Results  Component Value Date   HGBA1C 6.2 04/26/2024   HGBA1C 6.5 01/18/2024   HGBA1C 6.0 08/09/2023     How often do you need to have someone help you when you read instructions, pamphlets, or other written materials from your doctor or pharmacy?: 1 - Never  Interpreter Needed?: No  Information entered by :: R. Aletta Edmunds LPN   Activities of Daily Living     05/16/2024    1:41 PM 02/23/2024    8:58 AM  In your present state of health, do you have any difficulty performing the following activities:  Hearing? 1 0  Vision? 0 0  Difficulty concentrating or making decisions? 0 0  Walking or climbing stairs? 0   Dressing or bathing? 0   Doing errands, shopping? 0   Preparing Food and eating ? N   Using the Toilet? N   In the past six months, have you accidently leaked urine? Y   Do you have problems with loss of bowel control? N   Managing your  Medications? N   Managing your Finances? N   Housekeeping or managing your Housekeeping? N     Patient Care Team: Marylynn Verneita CROME, MD as PCP - General (Internal Medicine) Florencio Cara BIRCH, MD as Consulting Physician (Cardiology)  I have updated your Care Teams any recent Medical Services you may have received from other providers in the past year.     Assessment:   This is a routine wellness examination for Stacie Mcdonald.  Hearing/Vision screen Hearing Screening - Comments:: Wears aids Vision Screening - Comments:: No correction   Goals Addressed             This Visit's Progress    Patient Stated       Wants to lose a few pounds       Depression Screen     05/17/2024    8:25 AM 04/26/2024    3:44 PM 01/18/2024   10:51 AM 10/26/2023    1:53 PM 09/21/2023    1:42 PM 08/17/2023   11:08 AM 07/15/2023    2:43 PM  PHQ 2/9 Scores  PHQ - 2 Score 0 0 0 0 0 0 0  PHQ- 9 Score 2   3 0  2    Fall Risk     05/16/2024    1:41 PM 04/26/2024    3:44 PM 01/18/2024   10:51 AM 09/21/2023    1:41 PM 08/17/2023   11:08  AM  Fall Risk   Falls in the past year? 1 0 0 0 0  Number falls in past yr: 0 0 0 0 0  Injury with Fall? 0 0 0 0 0  Risk for fall due to : No Fall Risks No Fall Risks No Fall Risks No Fall Risks No Fall Risks  Follow up Falls evaluation completed;Falls prevention discussed Falls evaluation completed Falls evaluation completed Falls evaluation completed Falls evaluation completed    MEDICARE RISK AT HOME:  Medicare Risk at Home Any stairs in or around the home?: (Patient-Rptd) Yes If so, are there any without handrails?: (Patient-Rptd) No Home free of loose throw rugs in walkways, pet beds, electrical cords, etc?: No (advised to secure rugs) Adequate lighting in your home to reduce risk of falls?: (Patient-Rptd) Yes Life alert?: (Patient-Rptd) No Use of a cane, walker or w/c?: (Patient-Rptd) No Grab bars in the bathroom?: (Patient-Rptd) No Shower chair or bench in shower?:  (Patient-Rptd) Yes Elevated toilet seat or a handicapped toilet?: (Patient-Rptd) Yes  TIMED UP AND GO:  Was the test performed?  No  Cognitive Function: 6CIT completed    06/09/2018   10:26 AM 06/08/2017   10:40 AM  MMSE - Mini Mental State Exam  Orientation to time 5 5   Orientation to Place 5 5   Registration 3 3   Attention/ Calculation 5 5   Recall 3 3   Language- name 2 objects 2 2   Language- repeat 1 1  Language- follow 3 step command 3 3   Language- read & follow direction 1 1   Write a sentence 1 1   Copy design 1 1   Total score 30 30      Data saved with a previous flowsheet row definition        05/17/2024    8:32 AM 05/17/2023    8:51 AM 07/28/2019   10:23 AM  6CIT Screen  What Year? 0 points 0 points 0 points  What month? 0 points 0 points 0 points  What time? 0 points 0 points 0 points  Count back from 20 0 points 0 points 0 points  Months in reverse 0 points 0 points 0 points  Repeat phrase 0 points 0 points 0 points  Total Score 0 points 0 points 0 points    Immunizations Immunization History  Administered Date(s) Administered   Fluad Quad(high Dose 65+) 11/23/2022   Fluad Trivalent(High Dose 65+) 08/08/2023   Influenza Split 09/03/2013, 08/01/2014   Influenza, High Dose Seasonal PF 09/03/2017, 07/27/2018, 07/27/2019   Influenza-Unspecified 08/16/2012, 08/25/2021   Pneumococcal Conjugate-13 02/13/2015   Pneumococcal Polysaccharide-23 12/05/2009, 06/08/2017   Td 03/13/2010   Tdap 12/06/2003   Zoster Recombinant(Shingrix) 08/26/2018, 11/30/2018    Screening Tests Health Maintenance  Topic Date Due   Medicare Annual Wellness (AWV)  05/16/2024   Colonoscopy  08/17/2024   INFLUENZA VACCINE  06/16/2024   HEMOGLOBIN A1C  10/26/2024   OPHTHALMOLOGY EXAM  12/29/2024   Diabetic kidney evaluation - Urine ACR  01/17/2025   Diabetic kidney evaluation - eGFR measurement  04/26/2025   FOOT EXAM  04/26/2025   Pneumococcal Vaccine: 50+ Years  Completed    DEXA SCAN  Completed   Hepatitis C Screening  Completed   Zoster Vaccines- Shingrix  Completed   Hepatitis B Vaccines  Aged Out   HPV VACCINES  Aged Out   Meningococcal B Vaccine  Aged Out   DTaP/Tdap/Td  Discontinued   COVID-19 Vaccine  Discontinued  Health Maintenance  Health Maintenance Due  Topic Date Due   Medicare Annual Wellness (AWV)  05/16/2024   Colonoscopy  08/17/2024   Health Maintenance Items Addressed: DEXA ordered Discussed the need to update tetanus. Patient declines covid vaccine  Additional Screening:  Vision Screening: Recommended annual ophthalmology exams for early detection of glaucoma and other disorders of the eye. Up to date  Dr. Carolee Would you like a referral to an eye doctor? No    Dental Screening: Recommended annual dental exams for proper oral hygiene  Community Resource Referral / Chronic Care Management: CRR required this visit?  No   CCM required this visit?  No   Plan:    I have personally reviewed and noted the following in the patient's chart:   Medical and social history Use of alcohol, tobacco or illicit drugs  Current medications and supplements including opioid prescriptions. Patient is not currently taking opioid prescriptions. Functional ability and status Nutritional status Physical activity Advanced directives List of other physicians Hospitalizations, surgeries, and ER visits in previous 12 months Vitals Screenings to include cognitive, depression, and falls Referrals and appointments  In addition, I have reviewed and discussed with patient certain preventive protocols, quality metrics, and best practice recommendations. A written personalized care plan for preventive services as well as general preventive health recommendations were provided to patient.   Angeline Fredericks, LPN   12/23/7972   After Visit Summary: (MyChart) Due to this being a telephonic visit, the after visit summary with patients personalized plan was  offered to patient via MyChart   Notes: Nothing significant to report at this time.

## 2024-05-17 NOTE — Patient Instructions (Signed)
 Stacie Mcdonald , Thank you for taking time out of your busy schedule to complete your Annual Wellness Visit with me. I enjoyed our conversation and look forward to speaking with you again next year. I, as well as your care team,  appreciate your ongoing commitment to your health goals. Please review the following plan we discussed and let me know if I can assist you in the future. Your Game plan/ To Do List    Referrals: If you haven't heard from the office you've been referred to, please reach out to them at the phone provided.   Order has been placed for Dexa/Bone density. Remember to update your tetanus vaccine at your pharmacy. You have an order for:  []   2D Mammogram  []   3D Mammogram  [x]   Bone Density     Please call for appointment:  [facility]  [address] [place] [address] [phone]     Make sure to wear two-piece clothing.  No lotions, powders, or deodorants the day of the appointment. Make sure to bring picture ID and insurance card.  Bring list of medications you are currently taking including any supplements.   Follow up Visits: Next Medicare AWV with our clinical staff: 05/22/25 @ 8:50   Have you seen your provider in the last 6 months (3 months if uncontrolled diabetes)? Yes Next Office Visit with your provider: 10/26/24  Clinician Recommendations:  Aim for 30 minutes of exercise or brisk walking, 6-8 glasses of water , and 5 servings of fruits and vegetables each day.       This is a list of the screening recommended for you and due dates:  Health Maintenance  Topic Date Due   Colon Cancer Screening  08/17/2024   Flu Shot  06/16/2024   Hemoglobin A1C  10/26/2024   Mammogram  12/20/2024   Eye exam for diabetics  12/29/2024   Yearly kidney health urinalysis for diabetes  01/17/2025   Yearly kidney function blood test for diabetes  04/26/2025   Complete foot exam   04/26/2025   Medicare Annual Wellness  Visit  05/17/2025   Pneumococcal Vaccine for age over 45  Completed   DEXA scan (bone density measurement)  Completed   Hepatitis C Screening  Completed   Zoster (Shingles) Vaccine  Completed   Hepatitis B Vaccine  Aged Out   HPV Vaccine  Aged Out   Meningitis B Vaccine  Aged Out   DTaP/Tdap/Td vaccine  Discontinued   COVID-19 Vaccine  Discontinued    Advanced directives: (Copy Requested) Please bring a copy of your health care power of attorney and living will to the office to be added to your chart at your convenience. You can mail to [facility] [address] [place], [place] [address] or email to [email or web address] Advance Care Planning is important because it:  [x]  Makes sure you receive the medical care that is consistent with your values, goals, and preferences  [x]  It provides guidance to your family and loved ones and reduces their decisional burden about whether or not they are making the right decisions based on your wishes.

## 2024-05-22 DIAGNOSIS — Z961 Presence of intraocular lens: Secondary | ICD-10-CM | POA: Diagnosis not present

## 2024-05-31 ENCOUNTER — Other Ambulatory Visit: Payer: Self-pay | Admitting: Internal Medicine

## 2024-06-01 DIAGNOSIS — I129 Hypertensive chronic kidney disease with stage 1 through stage 4 chronic kidney disease, or unspecified chronic kidney disease: Secondary | ICD-10-CM | POA: Diagnosis not present

## 2024-06-01 DIAGNOSIS — N1831 Chronic kidney disease, stage 3a: Secondary | ICD-10-CM | POA: Diagnosis not present

## 2024-06-01 DIAGNOSIS — E785 Hyperlipidemia, unspecified: Secondary | ICD-10-CM | POA: Diagnosis not present

## 2024-06-01 DIAGNOSIS — I2510 Atherosclerotic heart disease of native coronary artery without angina pectoris: Secondary | ICD-10-CM | POA: Diagnosis not present

## 2024-06-05 ENCOUNTER — Encounter: Payer: Self-pay | Admitting: Nephrology

## 2024-06-08 DIAGNOSIS — G4733 Obstructive sleep apnea (adult) (pediatric): Secondary | ICD-10-CM | POA: Diagnosis not present

## 2024-06-19 DIAGNOSIS — H209 Unspecified iridocyclitis: Secondary | ICD-10-CM | POA: Diagnosis not present

## 2024-07-03 DIAGNOSIS — Z961 Presence of intraocular lens: Secondary | ICD-10-CM | POA: Diagnosis not present

## 2024-07-03 DIAGNOSIS — H209 Unspecified iridocyclitis: Secondary | ICD-10-CM | POA: Diagnosis not present

## 2024-07-11 ENCOUNTER — Telehealth: Payer: Self-pay

## 2024-07-11 DIAGNOSIS — E113393 Type 2 diabetes mellitus with moderate nonproliferative diabetic retinopathy without macular edema, bilateral: Secondary | ICD-10-CM | POA: Diagnosis not present

## 2024-07-11 LAB — HM DIABETES EYE EXAM

## 2024-07-11 NOTE — Telephone Encounter (Signed)
 Copied from CRM [id number]. Topic: General - Call Back - No Documentation >> Jul 11, 2024  3:05 PM Franky GRADE wrote: Reason for CRM: Patient returning a call she received from the office, Called [facility] and could not figure out who may have called. Asked patient if a v/m was left but she replied no.

## 2024-07-12 NOTE — Telephone Encounter (Signed)
 I do not see any documentation where anyone called this Patient.

## 2024-07-12 NOTE — Telephone Encounter (Signed)
 Is this the pt that you called yesterday about the Diabetic Eye Exam

## 2024-07-13 NOTE — Telephone Encounter (Signed)
 Is there anything else that we need to do with this?

## 2024-07-15 LAB — LAB REPORT - SCANNED: EGFR: 90

## 2024-07-19 DIAGNOSIS — G4733 Obstructive sleep apnea (adult) (pediatric): Secondary | ICD-10-CM | POA: Diagnosis not present

## 2024-07-19 DIAGNOSIS — J986 Disorders of diaphragm: Secondary | ICD-10-CM | POA: Diagnosis not present

## 2024-07-19 DIAGNOSIS — R0609 Other forms of dyspnea: Secondary | ICD-10-CM | POA: Diagnosis not present

## 2024-07-19 DIAGNOSIS — J301 Allergic rhinitis due to pollen: Secondary | ICD-10-CM | POA: Diagnosis not present

## 2024-07-31 DIAGNOSIS — K080 Exfoliation of teeth due to systemic causes: Secondary | ICD-10-CM | POA: Diagnosis not present

## 2024-08-03 DIAGNOSIS — I10 Essential (primary) hypertension: Secondary | ICD-10-CM | POA: Diagnosis not present

## 2024-08-03 DIAGNOSIS — R0789 Other chest pain: Secondary | ICD-10-CM | POA: Diagnosis not present

## 2024-08-03 DIAGNOSIS — I25110 Atherosclerotic heart disease of native coronary artery with unstable angina pectoris: Secondary | ICD-10-CM | POA: Diagnosis not present

## 2024-08-03 DIAGNOSIS — I5022 Chronic systolic (congestive) heart failure: Secondary | ICD-10-CM | POA: Diagnosis not present

## 2024-08-03 DIAGNOSIS — Z9861 Coronary angioplasty status: Secondary | ICD-10-CM | POA: Diagnosis not present

## 2024-08-04 DIAGNOSIS — Z853 Personal history of malignant neoplasm of breast: Secondary | ICD-10-CM | POA: Diagnosis not present

## 2024-08-04 DIAGNOSIS — I214 Non-ST elevation (NSTEMI) myocardial infarction: Secondary | ICD-10-CM | POA: Diagnosis not present

## 2024-08-04 DIAGNOSIS — Z79899 Other long term (current) drug therapy: Secondary | ICD-10-CM | POA: Diagnosis not present

## 2024-08-04 DIAGNOSIS — Z4682 Encounter for fitting and adjustment of non-vascular catheter: Secondary | ICD-10-CM | POA: Diagnosis not present

## 2024-08-04 DIAGNOSIS — T82855A Stenosis of coronary artery stent, initial encounter: Secondary | ICD-10-CM | POA: Diagnosis not present

## 2024-08-04 DIAGNOSIS — I25110 Atherosclerotic heart disease of native coronary artery with unstable angina pectoris: Secondary | ICD-10-CM | POA: Diagnosis not present

## 2024-08-04 DIAGNOSIS — R079 Chest pain, unspecified: Secondary | ICD-10-CM | POA: Diagnosis not present

## 2024-08-04 DIAGNOSIS — E782 Mixed hyperlipidemia: Secondary | ICD-10-CM | POA: Diagnosis not present

## 2024-08-04 DIAGNOSIS — R6884 Jaw pain: Secondary | ICD-10-CM | POA: Diagnosis not present

## 2024-08-04 DIAGNOSIS — G4733 Obstructive sleep apnea (adult) (pediatric): Secondary | ICD-10-CM | POA: Diagnosis not present

## 2024-08-04 DIAGNOSIS — J4489 Other specified chronic obstructive pulmonary disease: Secondary | ICD-10-CM | POA: Diagnosis not present

## 2024-08-04 DIAGNOSIS — I4510 Unspecified right bundle-branch block: Secondary | ICD-10-CM | POA: Diagnosis not present

## 2024-08-04 DIAGNOSIS — I25118 Atherosclerotic heart disease of native coronary artery with other forms of angina pectoris: Secondary | ICD-10-CM | POA: Diagnosis not present

## 2024-08-04 DIAGNOSIS — I252 Old myocardial infarction: Secondary | ICD-10-CM | POA: Diagnosis not present

## 2024-08-04 DIAGNOSIS — I517 Cardiomegaly: Secondary | ICD-10-CM | POA: Diagnosis not present

## 2024-08-04 DIAGNOSIS — I2510 Atherosclerotic heart disease of native coronary artery without angina pectoris: Secondary | ICD-10-CM | POA: Diagnosis not present

## 2024-08-04 DIAGNOSIS — K219 Gastro-esophageal reflux disease without esophagitis: Secondary | ICD-10-CM | POA: Diagnosis not present

## 2024-08-04 DIAGNOSIS — R9431 Abnormal electrocardiogram [ECG] [EKG]: Secondary | ICD-10-CM | POA: Diagnosis not present

## 2024-08-04 DIAGNOSIS — Z7984 Long term (current) use of oral hypoglycemic drugs: Secondary | ICD-10-CM | POA: Diagnosis not present

## 2024-08-04 DIAGNOSIS — I130 Hypertensive heart and chronic kidney disease with heart failure and stage 1 through stage 4 chronic kidney disease, or unspecified chronic kidney disease: Secondary | ICD-10-CM | POA: Diagnosis not present

## 2024-08-04 DIAGNOSIS — Y831 Surgical operation with implant of artificial internal device as the cause of abnormal reaction of the patient, or of later complication, without mention of misadventure at the time of the procedure: Secondary | ICD-10-CM | POA: Diagnosis not present

## 2024-08-04 DIAGNOSIS — E1122 Type 2 diabetes mellitus with diabetic chronic kidney disease: Secondary | ICD-10-CM | POA: Diagnosis not present

## 2024-08-04 DIAGNOSIS — G4700 Insomnia, unspecified: Secondary | ICD-10-CM | POA: Diagnosis not present

## 2024-08-04 DIAGNOSIS — I5022 Chronic systolic (congestive) heart failure: Secondary | ICD-10-CM | POA: Diagnosis not present

## 2024-08-04 DIAGNOSIS — I509 Heart failure, unspecified: Secondary | ICD-10-CM | POA: Diagnosis not present

## 2024-08-04 DIAGNOSIS — N189 Chronic kidney disease, unspecified: Secondary | ICD-10-CM | POA: Diagnosis not present

## 2024-08-04 DIAGNOSIS — Z452 Encounter for adjustment and management of vascular access device: Secondary | ICD-10-CM | POA: Diagnosis not present

## 2024-08-04 DIAGNOSIS — Z7982 Long term (current) use of aspirin: Secondary | ICD-10-CM | POA: Diagnosis not present

## 2024-08-07 ENCOUNTER — Telehealth: Payer: Self-pay

## 2024-08-07 NOTE — Transitions of Care (Post Inpatient/ED Visit) (Signed)
 08/07/2024  Name: Stacie Mcdonald MRN: [id number] DOB: 05-19-1946  Today's TOC FU Call Status: Today's TOC FU Call Status:: Successful TOC FU Call Completed TOC FU Call Complete Date: 08/07/24 Patient's Name and Date of Birth confirmed.  Transition Care Management Follow-up Telephone Call Date of Discharge: 08/06/24 Discharge Facility: Other [facility]) Name of Other (Non[facility]) Discharge Facility: [facility] Type of Discharge: Inpatient Admission Primary Inpatient Discharge Diagnosis:: NSTEMI How have you been since you were released from the hospital?: Better (patient denies any pain and feels a little fatigued) Any questions or concerns?: No  Items Reviewed: Did you receive and understand the discharge instructions provided?: Yes Medications obtained,verified, and reconciled?: Yes (Medications Reviewed) Any new allergies since your discharge?: No Dietary orders reviewed?: Yes Type of Diet Ordered:: low salt heart healthy Do you have support at home?: Yes People in Home [RPT]: spouse Name of Support/Comfort Primary Source: [name]  Medications Reviewed Today: Medications Reviewed Today     Reviewed by Chrystopher Stangl E, RN (Registered Nurse) on 08/07/24 at 1500  Med List Status: <None>   Medication Order Taking? Sig Documenting Provider Last Dose Status Informant  albuterol  (VENTOLIN  HFA) 108 (90 Base) MCG/ACT inhaler [id number] Yes Inhale 2 puffs into the lungs every 6 (six) hours as needed. Using in Am daily  per MD [provider]  Active   aspirin  81 MG tablet [id number] Yes Take 81 mg by mouth daily. [provider]  Active Self  clopidogrel  (PLAVIX ) 75 MG tablet [id number] Yes Take 1 tablet (75 mg total) by mouth daily. Marylynn Verneita CROME, MD  Active   D-MANNOSE PO [id number]  Take 800 mg by mouth daily.  Patient not taking: Reported on 08/07/2024   [provider]  Active Self  ELDERBERRY PO [id number] Yes Take 1,000 mg by  mouth daily. [provider]  Active Self  empagliflozin  (JARDIANCE ) 25 MG TABS tablet [id number] Yes TAKE 1 TABLET BY MOUTH DAILY BEFORE BREAKFAST Marylynn Verneita CROME, MD  Active            Med Note DORI, Harim Bi E   Mon Aug 07, 2024  2:39 PM) Patient states she is taking the 25 mg Jardiance  at this time.   furosemide  (LASIX ) 40 MG tablet [id number] Yes Take 40 mg by mouth daily. [provider]  Active Self           Med Note KERRIN, MELISSA R   Tue Feb 18, 2021  2:37 PM)    GLUCOSAMINE-CHONDROITIN PO [id number]  Take 2 tablets by mouth daily.  Patient not taking: Reported on 08/07/2024   [provider]  Active Self  glucose blood (CONTOUR NEXT TEST) test strip [id number] Yes Use to check blood sugar twice daily. Marylynn Verneita CROME, MD  Active   isosorbide  mononitrate (IMDUR ) 120 MG 24 hr tablet [id number]  Take 120 mg by mouth daily.   Patient not taking: Reported on 08/07/2024   [provider]  Active Self           Med Note PATRIC, SUSANNE P   Tue Jul 13, 2023  1:10 PM)    Lancets (FREESTYLE) lancets [id number] Yes To check blood sugars once daily Tullo, Teresa L, MD  Active   loratadine  (CLARITIN ) 10 MG tablet [id number]  Take 10 mg by mouth daily as needed for allergies.  Patient not taking: Reported on 08/07/2024   [provider]  Active Self  metoprolol  (TOPROL -XL) 200 MG 24 hr tablet [id number] Yes  TAKE 1 TABLET BY MOUTH DAILY. Marylynn Verneita CROME, MD  Active   nitroGLYCERIN  (NITROSTAT ) 0.4 MG SL tablet [id number] Yes Place 0.4 mg under the tongue every 5 (five) minutes as needed for chest pain. [provider]  Active Self           Med Note MARLAND BEATRIS CHRISTELLA [place] Aug 12, 2023  3:14 PM) 08/12/23: Reports during TOC acll has not needed post-hospital discharge from [facility] on 08/11/23   pantoprazole  (PROTONIX ) 40 MG tablet [id number] Yes Take 1 tablet (40 mg total) by mouth daily. Marylynn Verneita CROME, MD  Active   PRALUENT 75 MG/ML EMMANUEL [id number] Yes Inject  75 mg into the muscle every 14 (fourteen) days. [provider]  Active Self  ranolazine  (RANEXA ) 500 MG 12 hr tablet [id number] Yes Take 500 mg by mouth 2 (two) times daily. [provider]  Active Self           Med Note PATRIC, SUSANNE P   Tue Jul 13, 2023  1:12 PM)    sacubitril -valsartan  (ENTRESTO ) 49-51 MG [id number]  Take 1 tablet by mouth 2 (two) times daily.  Patient not taking: Reported on 08/07/2024   [provider]  Active Self  spironolactone  (ALDACTONE ) 25 MG tablet [id number] Yes Take 1 tablet (25 mg total) by mouth daily. Marylynn Verneita CROME, MD  Active   traZODone  (DESYREL ) 150 MG tablet [id number] Yes Take 1 tablet (150 mg total) by mouth at bedtime. Marylynn Verneita CROME, MD  Active             Home Care and Equipment/Supplies: Were Home Health Services Ordered?: No Any new equipment or medical supplies ordered?: No  Functional Questionnaire: Do you need assistance with bathing/showering or dressing?: No Do you need assistance with meal preparation?: No Do you need assistance with eating?: No Do you have difficulty maintaining continence: No Do you need assistance with getting out of bed/getting out of a chair/moving?: No Do you have difficulty managing or taking your medications?: No  Follow up appointments reviewed: PCP Follow-up appointment confirmed?: No (patient will call and scheduled hospital follow up with primary care provider) Specialist Hospital Follow-up appointment confirmed?: Yes Date of Specialist follow-up appointment?:  (patient states she has a scheduled follow up with Dr. Florencio in October 2025) Follow-Up Specialty Provider:: Dr. Florencio Do you need transportation to your follow-up appointment?: No Do you understand care options if your condition(s) worsen?: Yes-patient verbalized understanding  SDOH Interventions Today    Flowsheet Row Most Recent Value  SDOH Interventions   Food Insecurity Interventions Intervention Not  Indicated  Housing Interventions Intervention Not Indicated  Transportation Interventions Intervention Not Indicated  Utilities Interventions Intervention Not Indicated   Discussed and offered 30 day TOC program.  Patient declined.  The patient has been provided with contact information for the care management team and has been advised to call with any health -related questions or concerns.  The patient verbalized understanding with current plan of care.  The patient is directed to their insurance card regarding availability of benefits coverage.    Arvin Seip RN, BSN, CCM [employer], Population Health Case Manager Phone: [phone]

## 2024-08-07 NOTE — Patient Instructions (Signed)
 Visit Information  Thank you for taking time to visit with me today. Please don't hesitate to contact me if I can be of assistance to you   Patient instructions: Weigh daily and record Notify provider of weight gain greater than 2 lbs in 1 day or 5 lbs in 1 week Report questions or concerns to the heart center at [phone] Notify primary care provider for other symptoms Monitor incision site for signs of infection. Notify cardiologist if symptoms note.  Call cardiac rehab center if you do not hear from them to schedule   Patient verbalizes understanding of instructions and care plan provided today and agrees to view in MyChart. Active MyChart status and patient understanding of how to access instructions and care plan via MyChart confirmed with patient.     The patient has been provided with contact information for the care management team and has been advised to call with any health related questions or concerns.   Please call the care guide team at [phone] if you need to cancel or reschedule your appointment.   Please call the Suicide and Crisis Lifeline: 988 call the [place]  National Suicide Prevention Lifeline: 219-052-4957 or TTY: 475-345-3873 TTY 602-701-4312) to talk to a trained counselor call 1-800-273-TALK (toll free, 24 hour hotline) go to [facility] [address] [place] [phone]) if you are experiencing a Mental Health or Behavioral Health Crisis or need someone to talk to.  Arvin Seip RN, BSN, CCM [employer], Population Health Case Manager Phone: [phone]

## 2024-08-08 ENCOUNTER — Other Ambulatory Visit: Payer: Self-pay | Admitting: Internal Medicine

## 2024-08-14 ENCOUNTER — Ambulatory Visit (INDEPENDENT_AMBULATORY_CARE_PROVIDER_SITE_OTHER): Admitting: Internal Medicine

## 2024-08-14 ENCOUNTER — Encounter: Payer: Self-pay | Admitting: Internal Medicine

## 2024-08-14 VITALS — BP 120/70 | HR 69 | Ht 64.0 in | Wt 171.6 lb

## 2024-08-14 DIAGNOSIS — Z860100 Personal history of colon polyps, unspecified: Secondary | ICD-10-CM

## 2024-08-14 DIAGNOSIS — I255 Ischemic cardiomyopathy: Secondary | ICD-10-CM

## 2024-08-14 DIAGNOSIS — E1169 Type 2 diabetes mellitus with other specified complication: Secondary | ICD-10-CM

## 2024-08-14 DIAGNOSIS — Z7984 Long term (current) use of oral hypoglycemic drugs: Secondary | ICD-10-CM

## 2024-08-14 DIAGNOSIS — Z853 Personal history of malignant neoplasm of breast: Secondary | ICD-10-CM

## 2024-08-14 DIAGNOSIS — I214 Non-ST elevation (NSTEMI) myocardial infarction: Secondary | ICD-10-CM | POA: Diagnosis not present

## 2024-08-14 DIAGNOSIS — R918 Other nonspecific abnormal finding of lung field: Secondary | ICD-10-CM

## 2024-08-14 DIAGNOSIS — E785 Hyperlipidemia, unspecified: Secondary | ICD-10-CM | POA: Diagnosis not present

## 2024-08-14 DIAGNOSIS — Z23 Encounter for immunization: Secondary | ICD-10-CM | POA: Diagnosis not present

## 2024-08-14 DIAGNOSIS — E119 Type 2 diabetes mellitus without complications: Secondary | ICD-10-CM

## 2024-08-14 DIAGNOSIS — Z09 Encounter for follow-up examination after completed treatment for conditions other than malignant neoplasm: Secondary | ICD-10-CM

## 2024-08-14 DIAGNOSIS — D124 Benign neoplasm of descending colon: Secondary | ICD-10-CM

## 2024-08-14 HISTORY — DX: Non-ST elevation (NSTEMI) myocardial infarction: I21.4

## 2024-08-14 LAB — COMPREHENSIVE METABOLIC PANEL WITH GFR
ALT: 12 U/L (ref 0–35)
AST: 10 U/L (ref 0–37)
Albumin: 4.5 g/dL (ref 3.5–5.2)
Alkaline Phosphatase: 54 U/L (ref 39–117)
BUN: 20 mg/dL (ref 6–23)
CO2: 28 meq/L (ref 19–32)
Calcium: 9.5 mg/dL (ref 8.4–10.5)
Chloride: 101 meq/L (ref 96–112)
Creatinine, Ser: 0.84 mg/dL (ref 0.40–1.20)
GFR: 66.01 mL/min (ref >60.00)
Glucose, Bld: 105 mg/dL — ABNORMAL HIGH (ref 70–99)
Potassium: 4.2 meq/L (ref 3.5–5.1)
Sodium: 137 meq/L (ref 135–145)
Total Bilirubin: 0.4 mg/dL (ref 0.2–1.2)
Total Protein: 6.9 g/dL (ref 6.0–8.3)

## 2024-08-14 LAB — HEMOGLOBIN A1C: Hgb A1c MFr Bld: 6.5 % (ref 4.6–6.5)

## 2024-08-14 LAB — LIPID PANEL
Cholesterol: 111 mg/dL (ref 0–200)
HDL: 32.7 mg/dL — ABNORMAL LOW (ref >39.00)
LDL Cholesterol: 43 mg/dL (ref 0–99)
NonHDL: 78.3
Total CHOL/HDL Ratio: 3
Triglycerides: 178 mg/dL — ABNORMAL HIGH (ref 0.0–149.0)
VLDL: 35.6 mg/dL (ref 0.0–40.0)

## 2024-08-14 LAB — LDL CHOLESTEROL, DIRECT: Direct LDL: 67 mg/dL

## 2024-08-14 MED ORDER — SPIRONOLACTONE 25 MG PO TABS: 25.0000 mg | ORAL_TABLET | Freq: Every day | ORAL | 1 refills | Status: DC

## 2024-08-14 MED ORDER — METOPROLOL SUCCINATE ER 200 MG PO TB24: ORAL_TABLET | ORAL | 1 refills | Status: DC

## 2024-08-14 MED ORDER — PANTOPRAZOLE SODIUM 40 MG PO TBEC: 40.0000 mg | DELAYED_RELEASE_TABLET | Freq: Every day | ORAL | 1 refills | Status: DC

## 2024-08-14 MED ORDER — CLOPIDOGREL BISULFATE 75 MG PO TABS: 75.0000 mg | ORAL_TABLET | Freq: Every day | ORAL | 1 refills | Status: DC

## 2024-08-14 NOTE — Patient Instructions (Signed)
  Referral to [facility]ULMONARY REHAB AND [facility] ARE  in progress .  If you are not contacted in one week,  check your mychart.  The referral coordinators will send you a message if they can't reach you by phone.

## 2024-08-14 NOTE — Assessment & Plan Note (Signed)
 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 She has continued to take Imdur  120 mg and has not had hypotension.  She has not increased Jardiance  from 25 mg to 30 mg .. She is continuining all of her previous cardiac meds..  repeat lipids are pending

## 2024-08-14 NOTE — Progress Notes (Unsigned)
 Subjective:  Patient ID: Stacie Mcdonald, female    DOB: 01/21/1946  Age: 78 y.o. MRN: [id number]  CC: There were no encounter diagnoses.   HPI Stacie Mcdonald presents for  Chief Complaint  Patient presents with   Hospitalization Follow-up   Developed  recurrent angina (jaw and neck pain on the Sunday prior to admission progressively more frequent .  Worse at night,  . Admitted Sept 19 (FRiday) at [facility] with NSTEMI.   Troponins < 2000.  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   Discharged home on Sept 21.  Still taking Imdur  (did not stop as directd).  Still having occasional  tightness of jaw not using ntg    Cc: feels tired since hospitalizatoin   Outpatient Medications Prior to Visit  Medication Sig Dispense Refill   albuterol  (VENTOLIN  HFA) 108 (90 Base) MCG/ACT inhaler Inhale 2 puffs into the lungs every 6 (six) hours as needed. Using in Am daily  per MD     aspirin  81 MG tablet Take 81 mg by mouth daily.     clopidogrel  (PLAVIX ) 75 MG tablet Take 1 tablet (75 mg total) by mouth daily. 90 tablet 1   D-MANNOSE PO Take 800 mg by mouth daily.     ELDERBERRY PO Take 1,000 mg by mouth daily.     empagliflozin  (JARDIANCE ) 25 MG TABS tablet TAKE 1 TABLET BY MOUTH DAILY BEFORE BREAKFAST 30 tablet 5   furosemide  (LASIX ) 40 MG tablet Take 40 mg by mouth daily.     GLUCOSAMINE-CHONDROITIN PO Take 2 tablets by mouth daily.     glucose blood (CONTOUR NEXT TEST) test strip Use to check blood sugar twice daily. 100 each 12   isosorbide  mononitrate (IMDUR ) 120 MG 24 hr tablet Take 120 mg by mouth daily.      Lancets (FREESTYLE) lancets To check blood sugars once daily 100 each 3   loratadine  (CLARITIN ) 10 MG tablet Take 10 mg by mouth daily as needed for allergies.     metoprolol  (TOPROL -XL) 200 MG 24 hr tablet TAKE 1 TABLET BY MOUTH DAILY. 90 tablet 1    nitroGLYCERIN  (NITROSTAT ) 0.4 MG SL tablet Place 0.4 mg under the tongue every 5 (five) minutes as needed for chest pain.     pantoprazole  (PROTONIX ) 40 MG tablet Take 1 tablet (40 mg total) by mouth daily. 90 tablet 1   PRALUENT 75 MG/ML SOAJ Inject 75 mg into the muscle every 14 (fourteen) days.     ranolazine  (RANEXA ) 1000 MG SR tablet Take 1,000 mg by mouth 2 (two) times daily.     sacubitril -valsartan  (ENTRESTO ) 49-51 MG Take 1 tablet by mouth 2 (two) times daily.     spironolactone  (ALDACTONE ) 25 MG tablet Take 1 tablet (25 mg total) by mouth daily. 90 tablet 1   traZODone  (DESYREL ) 150 MG tablet Take 1 tablet (150 mg total) by mouth at bedtime. 90 tablet 1   ranolazine  (RANEXA ) 500 MG 12 hr tablet Take 500 mg by mouth 2 (two) times daily.     No facility-administered medications prior to visit.    Review of Systems;  Patient denies headache, fevers, malaise, unintentional weight loss, skin rash, eye pain, sinus congestion and sinus pain, sore throat, dysphagia,  hemoptysis , cough, dyspnea, wheezing, chest pain, palpitations, orthopnea, edema, abdominal pain, nausea, melena, diarrhea,  constipation, flank pain, dysuria, hematuria, urinary  Frequency, nocturia, numbness, tingling, seizures,  Focal weakness, Loss of consciousness,  Tremor, insomnia, depression, anxiety, and suicidal ideation.      Objective:  BP 120/70   Pulse 69   Ht 5' 4 (1.626 m)   Wt 171 lb 9.6 oz (77.8 kg)   SpO2 95%   BMI 29.46 kg/m   BP Readings from Last 3 Encounters:  08/14/24 120/70  04/26/24 120/72  02/23/24 98/60    Wt Readings from Last 3 Encounters:  08/14/24 171 lb 9.6 oz (77.8 kg)  05/17/24 178 lb (80.7 kg)  04/26/24 176 lb 9.6 oz (80.1 kg)    Physical Exam  Lab Results  Component Value Date   HGBA1C 6.2 04/26/2024   HGBA1C 6.5 01/18/2024   HGBA1C 6.0 08/09/2023    Lab Results  Component Value Date   CREATININE 1.15 04/26/2024   CREATININE 1.12 01/18/2024   CREATININE 1.02  09/08/2023    Lab Results  Component Value Date   WBC 6.6 08/17/2023   HGB 13.5 08/17/2023   HCT 41.7 08/17/2023   PLT 288.0 08/17/2023   GLUCOSE 109 (H) 04/26/2024   CHOL 132 04/26/2024   TRIG 187.0 (H) 04/26/2024   HDL 37.70 (L) 04/26/2024   LDLDIRECT 75.0 04/26/2024   LDLCALC 57 04/26/2024   ALT 15 04/26/2024   AST 13 04/26/2024   NA 138 04/26/2024   K 4.4 04/26/2024   CL 99 04/26/2024   CREATININE 1.15 04/26/2024   BUN 24 (H) 04/26/2024   CO2 29 04/26/2024   TSH 3.11 08/17/2023   INR 1.0 08/07/2023   HGBA1C 6.2 04/26/2024   MICROALBUR 0.8 01/18/2024    No results found.  Assessment & Plan:  .There are no diagnoses linked to this encounter.   I spent 34 minutes on the day of this face to face encounter reviewing patient's  most recent visit with cardiology,  nephrology,  and neurology,  prior relevant surgical and non surgical procedures, recent  labs and imaging studies, counseling on weight management,  reviewing the assessment and plan with patient, and post visit ordering and reviewing of  diagnostics and therapeutics with patient  .   Follow-up: No follow-ups on file.   Verneita LITTIE Kettering, MD

## 2024-08-14 NOTE — Assessment & Plan Note (Signed)
 Cntinue jardiance  25 mg daily  she has not increased dose to 30 mg daily (10 mg x 3) per [facility] hospitalization rec

## 2024-08-15 ENCOUNTER — Ambulatory Visit: Payer: Self-pay | Admitting: Internal Medicine

## 2024-08-15 DIAGNOSIS — I255 Ischemic cardiomyopathy: Secondary | ICD-10-CM | POA: Insufficient documentation

## 2024-08-15 NOTE — Assessment & Plan Note (Signed)
 Unchanged from sept 2024 ECHO per review of report via EPIC portal. Continue metoprolol , entresto , ranexa . Follow up with dr Florencio

## 2024-08-15 NOTE — Assessment & Plan Note (Signed)
 Patient is stable post discharge from [facility] on Sept 21 for NSTEMI and has no new issues or questions about discharge plans at the visit today for hospital follow up. All labs , imaging studies and progress notes from admission were reviewed with patient today

## 2024-08-15 NOTE — Assessment & Plan Note (Addendum)
 S/p right mastectomy 1991 ,  Chemo .  She continues annual screening with Unilateral left mammogram , last one Feb 2025

## 2024-08-15 NOTE — Assessment & Plan Note (Addendum)
 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  Referral made . Will likely need to defer procedure given recent stenting of coronaries x 2

## 2024-08-15 NOTE — Assessment & Plan Note (Addendum)
 Repeat CT was done in 2022 by her pulmonologist  Dr Theotis  and all nodules were considered stable since 2019. Stacie Mcdonald  No further surveillance was recommended.

## 2024-08-23 DIAGNOSIS — D0439 Carcinoma in situ of skin of other parts of face: Secondary | ICD-10-CM | POA: Diagnosis not present

## 2024-08-23 DIAGNOSIS — C44619 Basal cell carcinoma of skin of left upper limb, including shoulder: Secondary | ICD-10-CM | POA: Diagnosis not present

## 2024-08-29 DIAGNOSIS — I10 Essential (primary) hypertension: Secondary | ICD-10-CM | POA: Diagnosis not present

## 2024-08-29 DIAGNOSIS — I5022 Chronic systolic (congestive) heart failure: Secondary | ICD-10-CM | POA: Diagnosis not present

## 2024-08-29 DIAGNOSIS — I25110 Atherosclerotic heart disease of native coronary artery with unstable angina pectoris: Secondary | ICD-10-CM | POA: Diagnosis not present

## 2024-08-29 DIAGNOSIS — Z955 Presence of coronary angioplasty implant and graft: Secondary | ICD-10-CM | POA: Diagnosis not present

## 2024-08-30 DIAGNOSIS — I5032 Chronic diastolic (congestive) heart failure: Secondary | ICD-10-CM | POA: Diagnosis not present

## 2024-08-30 DIAGNOSIS — I2510 Atherosclerotic heart disease of native coronary artery without angina pectoris: Secondary | ICD-10-CM | POA: Diagnosis not present

## 2024-08-30 DIAGNOSIS — I214 Non-ST elevation (NSTEMI) myocardial infarction: Secondary | ICD-10-CM | POA: Diagnosis not present

## 2024-08-30 DIAGNOSIS — I10 Essential (primary) hypertension: Secondary | ICD-10-CM | POA: Diagnosis not present

## 2024-08-30 NOTE — Progress Notes (Signed)
 Primary care physician: Marylynn Verneita Kay, MD.  Reason for consultation: CAD.  History of Present Illness Stacie Mcdonald is a 78 year old female with coronary artery disease who presents with recurrent angina for interventional cardiology consultation.  She has a history of coronary artery disease with multiple stents placed in the past. Recently, she experienced a myocardial infarction and underwent a procedure at [facility] where an additional stent was placed, and balloon angioplasty was performed on existing stents. Initially, she felt better for about a week and a half post-procedure.  Her angina, characterized by neck and jaw pain, has gradually worsened. The pain was initially mild and tolerable but has since intensified. She experienced significant discomfort last night while at rest, although she is currently not experiencing pain. Physical exertion, such as walking around [employer], also triggers discomfort, but she was able to complete her activities.  Her current medications include aspirin , Plavix , metoprolol  200 mg, spironolactone , sacubitril /valsartan , and Ranexa  1000 mg twice daily. Her home blood pressure readings are typically around 120/70 mmHg. She has not tried Isosorbide  (Imdur ) previously but reports that nitroglycerin  is used frequently for symptom relief.  Family history is significant for heart disease, with her mother having died at a young age. She is unable to tolerate statin medications but is on Praluent to manage cholesterol levels.  Review of systems Twelve point review of systems was completed and was non-contributory except as noted above in the history of present illness.  Patient Active Problem List  Diagnosis  . CHF (congestive heart failure) (CMS/HHS-HCC)  . Essential hypertension  . Coronary artery disease  . Arrhythmia  . Mixed hyperlipidemia  . NSTEMI (non-ST elevated myocardial infarction) (CMS/HHS-HCC)  . Tachycardia  . Recurrent angina status post  coronary stent placement  . OSA on CPAP  . Statin intolerance  . Myalgia due to statin  . History of colonic polyps  . Hospital discharge follow-up  . Status post coronary angioplasty  . CKD (chronic kidney disease) stage 3, GFR 30-59 ml/min (CMS-HCC)   Current Outpatient Medications  Medication Sig Dispense Refill  . alirocumab (PRALUENT PEN) 75 mg/mL pen injector Inject 75 mg subcutaneously every 14 (fourteen) days 6 mL 3  . ascorbic acid/multivit-min (EMERGEN-C ORAL) Take by mouth    . aspirin  81 MG EC tablet Take 81 mg by mouth once daily.    SABRA azelastine (ASTELIN) 137 mcg nasal spray Place 1 spray into both nostrils 2 (two) times daily 30 mL 4  . clopidogreL  (PLAVIX ) 75 mg tablet Take 1 tablet (75 mg total) by mouth once daily 90 tablet 11  . FUROsemide  (LASIX ) 40 MG tablet Take 1 tablet (40 mg total) by mouth once daily 90 tablet 0  . JARDIANCE  25 mg tablet Take 25 mg by mouth every morning before breakfast    . metoprolol  succinate (TOPROL -XL) 200 MG XL tablet Take 1 tablet (200 mg total) by mouth once daily 90 tablet 3  . mv-min/vit C/elderber/herb 124 (AIRBORNE ELDERBERRY ORAL) Take by mouth    . nitroGLYcerin  (NITROSTAT ) 0.4 MG SL tablet Place 1 tablet (0.4 mg total) under the tongue every 5 (five) minutes as needed for Chest pain 25 tablet 0  . pantoprazole  (PROTONIX ) 40 MG DR tablet Take 40 mg by mouth    . ranolazine  (RANEXA ) 1,000 mg ER tablet Take 1 tablet (1,000 mg total) by mouth 2 (two) times daily for 360 days 180 tablet 3  . sacubitriL -valsartan  (ENTRESTO ) 49-51 mg tablet Take 1 tablet by mouth 2 (two)  times daily 180 tablet 3  . spironolactone  (ALDACTONE ) 25 MG tablet Take 1 tablet (25 mg total) by mouth once daily 30 tablet 3  . traZODone  (DESYREL ) 150 MG tablet Take 150 mg by mouth at bedtime    . albuterol  MDI, PROVENTIL , VENTOLIN , PROAIR , HFA 90 mcg/actuation inhaler Inhale 2 inhalations into the lungs every 6 (six) hours as needed 18 g 4  . isosorbide  mononitrate  (IMDUR ) 30 MG ER tablet Take 1 tablet (30 mg total) by mouth once daily 90 tablet 3   No current facility-administered medications for this visit.   Allergies  Allergen Reactions  . Penicillins Hives  . Hydrocodone -Acetaminophen  Nausea  . Statins-Hmg-Coa Reductase Inhibitors Muscle Pain   Past Medical History:  Diagnosis Date  . Allergic rhinitis due to allergen   . Angina pectoris   . Arrhythmia    tachycardia  . Asthma without status asthmaticus (HHS-HCC)   . Breast cancer (CMS/HHS-HCC)    S/P mastectomy, chemo, XRT  . CHF (congestive heart failure) (CMS/HHS-HCC)   . COPD (chronic obstructive pulmonary disease) (CMS/HHS-HCC)   . Coronary artery disease   . GERD (gastroesophageal reflux disease)   . GI bleed    after stent placement  . Hyperlipidemia   . Hypertension   . Myocardial infarction (CMS/HHS-HCC)   . Obesity   . Osteoporosis, post-menopausal   . Sleep apnea   . Tachycardia    Past Surgical History:  Procedure Laterality Date  . COLONOSCOPY  02/25/2004   hyperplastic and adenomatous  . CARDIAC CATHETERIZATION  01/2007   stent placement, PTCA. Status post stent placement in the mid-left anterior descending artery.  . COLONOSCOPY  02/21/2008   hyperplastic  . EGD  02/21/2008   09/18/1993, 02/25/2004, no repeat needed  . COLONOSCOPY  02/05/2014   repeat 02/06/2019 (MUS)  . cataract surgery Left 04/2020  . chemo xrt    . MASTECTOMY PARTIAL    . reconstructive breast    . TONSILLECTOMY     Social history She reports that she has never smoked. She has never used smokeless tobacco. She reports that she does not drink alcohol and does not use drugs.  Family History  Problem Relation Name Age of Onset  . Ulcers Father Quintin   . Lung cancer Father Quintin   . Cancer Father Quintin   . Breast cancer Other 2 aunts   . Myocardial Infarction (Heart attack) Mother Johnston   . High blood pressure (Hypertension) Mother Johnston   . Diabetes type II Sister    .  Hypothyroidism Sister      PHYSICAL EXAMINATION:  BP (!) 148/86   Pulse 85   Ht 162.6 cm (5' 4)   Wt 78.6 kg (173 lb 3.2 oz)   SpO2 93%   BMI 29.73 kg/m   General Appearance:  Alert, cooperative, no distress, pleasant  HEENT:  Sclera nonicteric  Neck: Carotids 2+ bilaterally, no carotid bruits, No JVD, no HJR  No thyromegaly noted  Lungs:   Clear to auscultation bilaterally, respirations unlabored  Heart:  Regular rhythm Normal S1 and S2  No murmurs No rubs/gallops Non-displaced PMI  Abdomen:   Soft, non-tender, bowel sounds active, no bruit  Extremities: Extremities normal, no cyanosis No LE edema  Pulses: PTs 1+ symmetric bilaterally  Skin: No lower extremity rashes or ulcers  Neurologic: Alert, interactive, and appropriate, grossly moving all 4 extremities   LABS:   Last 3 CBC results: Lab Results  Component Value Date   WBC 6.8 08/06/2024  WBC 9.8 08/05/2024   WBC 10.6 (H) 08/04/2024   Lab Results  Component Value Date   HGB 12.5 08/06/2024   HGB 12.4 08/05/2024   HGB 14.3 08/04/2024   Lab Results  Component Value Date   HCT 39.2 08/06/2024   HCT 39.0 08/05/2024   HCT 44.7 08/04/2024   Lab Results  Component Value Date   MCV 95 08/06/2024   MCV 95 08/05/2024   MCV 95 08/04/2024   Lab Results  Component Value Date   PLT 195 08/06/2024   PLT 216 08/05/2024   PLT 292 08/04/2024   Lab Results  Component Value Date   CREATININE 0.7 08/06/2024   BUN 13 08/06/2024   NA 141 08/06/2024   K 4.3 08/06/2024   CL 105 08/06/2024   CO2 21 08/06/2024   Lab Results  Component Value Date   HGBA1C 6.1 (H) 08/06/2024   Lab Results  Component Value Date   CHOLTOTAL 87 08/05/2024   CHOLTOTAL 149 06/22/2023   Lab Results  Component Value Date   HDL 31 08/05/2024   HDL 40.4 06/22/2023   HDL 60.9 01/14/2021   Lab Results  Component Value Date   LDLCALC 68 06/22/2023   LDLCALC 62 01/14/2021   Lab Results  Component Value Date   TRIG 89  08/05/2024   TRIG 201 (H) 06/22/2023   Lab Results  Component Value Date   ALT 16 08/05/2024   AST 23 08/05/2024   ALKPHOS 46 08/05/2024   Assessment & Plan Coronary artery disease with recurrent angina post-stenting and recent myocardial infarction Recurrent angina post-stenting with recent myocardial infarction. Angina symptoms include neck and jaw pain, exacerbated by exertion and occurring at rest. Recent intervention at [facility] involved stenting and balloon angioplasty, but symptoms have returned. Differential includes possible plaque rupture or restenosis of previously treated arteries. Discussed potential need for further intervention, including angiogram, and possible bypass surgery if stenting is no longer viable. Risks of bypass surgery discussed, especially given her age. Consideration of clinical trials for refractory angina. - Order angi[facility] at [facility] on September 05, 2024. - Prescribe Isosorbide  (Imdur ) to manage angina symptoms, to be taken once daily in the morning. - Continue current medications: aspirin , Plavix , metoprolol , spironolactone , sacubitril /valsartan , and Ranexa . - Monitor for side effects of Isosorbide , such as headache. - Discuss potential need for bypass surgery or clinical trial participation based on angiogram results.  Hypotension Hypotension with previous low blood pressure readings. Current blood pressure readings at home are 120/70 mmHg. Isosorbide  is not expected to significantly lower blood pressure further.  Hyperlipidemia managed with PCSK9 inhibitor Hyperlipidemia managed with Praluent due to statin intolerance. Genetic predisposition to heart disease noted, with family history of early cardiac death. Praluent is being used to lower cholesterol and reduce future cardiovascular events. - Continue Praluent for hyperlipidemia management.  These notes generated with voice recognition software.  I apologize for typographical errors.  Murray FURY Katina, MD, [facility],  FSCAI

## 2024-09-01 NOTE — Progress Notes (Signed)
 Cardiac Catheterization Pre-Procedure  I have reviewed the medical and surgical history, family history, review of systems, current medications, allergies and sensitivities, physical examination, laboratory and diagnostic data.   Stacie Mcdonald is a 78 y.o. female with a PMHX of CAD s/p DES to RCA (01/2024), h/o RCA in-stent rethrombosis (07/2023), HFrEF (EF 35%), angina, breast cancer, CKD, HTN, OSA, GERD, HLD, COPD, OSA on CPAP, and non-insulin-dependent T2DM, who was recently admitted with high risk NSTEMI 08/04/24 and underwent PTCA/DCB to the mid RCA and PTCA/Shockwave lithotripsy/DES to the proximal RCA, felt well for ~ a week and a half and now presents for coronary angiography in setting of recurrent angina.  Loaded with 324 mg ASA and 300 mg Plavix  in CVSSU  Labs (08/06/24, scanned into EPIC): K 4.2 Cr 0.84 Lab Results  Component Value Date   INR 1.1 08/05/2024   INR 1.0 08/04/2024   Lab Results  Component Value Date   PLT 195 08/06/2024    Latest Reference Range & Units 09/05/24 09:02  Hemoglobin, Arterial 12.0 - 15.5 g/dL 86.8  Hematocrit, Arterial 35.0 - 45.0 % 39.0  Sodium, Arterial 135 - 145 mmol/L 135  Potassium, Arterial 3.5 - 5.0 mmol/L 4.5    EKG: NSR, late transition, nonspecific ST-T wave changes, prolonged QT Plan for procedure: cardiac catheterization, coronary angiography Alternative options: no catheterization The procedures are indicated to evaluate and/or treat (diagnosis): CAD of native vessel with unstable angina  Plan for moderate sedation: Moderate sedation indicated to provide moderate sedation and analgesia during the invasive procedure. Adverse experiences with sedation / analgesia reviewed. The patient is an appropriate candidate for moderate sedation. Planned sedation may include: Diphenhydramine (Benadryl), Midazolam  (Versed ), Hydromorphone (Dilaudid) and Fentanyl  (Sublimaze )  ASA Classification: IV - Severe Symptomatic disease  Mallampati  Classification: not assessed  Plan for blood product transfusion: transfusion not anticipated  PHYSICAL EXAM: BP 118/71   Pulse 75   Temp 36.6 C (97.8 F) (Oral)   Resp 22   Ht 162.6 cm (5' 4.02)   Wt 78.8 kg (173 lb 11.6 oz)   SpO2 96%   BMI 29.80 kg/m  Body mass index is 29.8 kg/m. General appearance: alert, cooperative, pleasant, in no acute distress Head: Normocephalic, atraumatic Eyes: conjunctiva/corneas normal, PERRL Oropharynx: moist without lesions, teeth in good repair Neck: supple and no JVD Heart: regular rate and rhythm, without murmur Lungs: clear to auscultation, without rales or wheeze, good air exchange Abdomen: soft, nondistended, nontender Ext: no edema in LE bilaterally, 2+ femorals no bruit, 2+ DP/PTs, 1+ RRA, 2+ LRA  The patient was interviewed and examined, and the available chart reviewed.  The indications, procedure, risk, potential complications (as listed on the respective consent forms), and alternatives for both the procedure and for moderate sedation were explained and discussed.  The patient understands and has signed the informed consent for the procedure and the informed consent for moderate sedation.  Plan for post-procedure care needs: Outpatient holding  TIFFANY BARBARANN MASSA, NP 09/05/2024

## 2024-09-01 NOTE — Progress Notes (Signed)
 Prior Authorization Request   Type of Request   [x]   Original (Date submitted: 08/31/24)  []   Re-submit after denial (Date submitted: mm/dd/yyyy)  []   Appeal (Date submitted: mm/dd/yyyy)  []   Second level appeal (Date submitted: mm/dd/yyyy)  Medication Name and Strength:   Praluent 75mg                Rx Insurance Information to which PA Submitted:     Rx [employer] Name:  bcbsnc part d Pt ID#:  [id number] RxGRP:  [id number] RxBIN:  [id number] RxPCN:  [facility] PA Phone:  []  No PA required, above issue resolved and patient can receive medication  []  Patient notified on (date)  Completed PA Details:  DIAGNOSIS USED TO SUBMIT PA: i50.9  DOCUMENTED MEDICATIONS TRIED/FAILED:   Completed PA authorization submitted via:    [x]    CoverMyMeds website on 09/01/24 CMM Key:   [id number]   PA Status   Status Date: 08/31/24  For: [x]   Original []   Re-submit after denial []   Appeal []   Second level appeal                    [x]   Approved  Start Date:08/31/24      End Date: 08/31/25     Case/Reference ID#: [id number]    Co-pay Amount: $   0.00    Does patient need PAP referral? []   YES [x]   NO               []   Denied    Case/Reference ID#:    Denial Reason:  Francel[place] (Key: [id number])     PA Case ID #: [id number] Rx #: [id number] Outcome Approved on October 16 by [employer] [place] [facility] 2017 Approved. Effective Date: 08/31/2024         Authorization Expiration Date: 08/31/2025 Drug    Praluent 75MG /ML auto-injectors  IDA VIKEN Medication Access Specialist

## 2024-09-05 DIAGNOSIS — K219 Gastro-esophageal reflux disease without esophagitis: Secondary | ICD-10-CM | POA: Diagnosis not present

## 2024-09-05 DIAGNOSIS — G4733 Obstructive sleep apnea (adult) (pediatric): Secondary | ICD-10-CM | POA: Diagnosis not present

## 2024-09-05 DIAGNOSIS — Z79899 Other long term (current) drug therapy: Secondary | ICD-10-CM | POA: Diagnosis not present

## 2024-09-05 DIAGNOSIS — R9431 Abnormal electrocardiogram [ECG] [EKG]: Secondary | ICD-10-CM | POA: Diagnosis not present

## 2024-09-05 DIAGNOSIS — Z955 Presence of coronary angioplasty implant and graft: Secondary | ICD-10-CM | POA: Diagnosis not present

## 2024-09-05 DIAGNOSIS — Z9010 Acquired absence of unspecified breast and nipple: Secondary | ICD-10-CM | POA: Diagnosis not present

## 2024-09-05 DIAGNOSIS — I2510 Atherosclerotic heart disease of native coronary artery without angina pectoris: Secondary | ICD-10-CM | POA: Diagnosis not present

## 2024-09-05 DIAGNOSIS — E782 Mixed hyperlipidemia: Secondary | ICD-10-CM | POA: Diagnosis not present

## 2024-09-05 DIAGNOSIS — I130 Hypertensive heart and chronic kidney disease with heart failure and stage 1 through stage 4 chronic kidney disease, or unspecified chronic kidney disease: Secondary | ICD-10-CM | POA: Diagnosis not present

## 2024-09-05 DIAGNOSIS — Z885 Allergy status to narcotic agent status: Secondary | ICD-10-CM | POA: Diagnosis not present

## 2024-09-05 DIAGNOSIS — I5032 Chronic diastolic (congestive) heart failure: Secondary | ICD-10-CM | POA: Diagnosis not present

## 2024-09-05 DIAGNOSIS — I25110 Atherosclerotic heart disease of native coronary artery with unstable angina pectoris: Secondary | ICD-10-CM | POA: Diagnosis not present

## 2024-09-05 DIAGNOSIS — T82855A Stenosis of coronary artery stent, initial encounter: Secondary | ICD-10-CM | POA: Diagnosis not present

## 2024-09-05 DIAGNOSIS — I5022 Chronic systolic (congestive) heart failure: Secondary | ICD-10-CM | POA: Diagnosis not present

## 2024-09-05 DIAGNOSIS — Z7982 Long term (current) use of aspirin: Secondary | ICD-10-CM | POA: Diagnosis not present

## 2024-09-05 DIAGNOSIS — Z888 Allergy status to other drugs, medicaments and biological substances status: Secondary | ICD-10-CM | POA: Diagnosis not present

## 2024-09-05 DIAGNOSIS — I2584 Coronary atherosclerosis due to calcified coronary lesion: Secondary | ICD-10-CM | POA: Diagnosis not present

## 2024-09-05 DIAGNOSIS — Z7984 Long term (current) use of oral hypoglycemic drugs: Secondary | ICD-10-CM | POA: Diagnosis not present

## 2024-09-05 DIAGNOSIS — J449 Chronic obstructive pulmonary disease, unspecified: Secondary | ICD-10-CM | POA: Diagnosis not present

## 2024-09-05 DIAGNOSIS — Z853 Personal history of malignant neoplasm of breast: Secondary | ICD-10-CM | POA: Diagnosis not present

## 2024-09-05 DIAGNOSIS — Z880 Allergy status to penicillin: Secondary | ICD-10-CM | POA: Diagnosis not present

## 2024-09-05 DIAGNOSIS — E1122 Type 2 diabetes mellitus with diabetic chronic kidney disease: Secondary | ICD-10-CM | POA: Diagnosis not present

## 2024-09-05 DIAGNOSIS — N189 Chronic kidney disease, unspecified: Secondary | ICD-10-CM | POA: Diagnosis not present

## 2024-09-05 DIAGNOSIS — I509 Heart failure, unspecified: Secondary | ICD-10-CM | POA: Diagnosis not present

## 2024-09-05 DIAGNOSIS — I252 Old myocardial infarction: Secondary | ICD-10-CM | POA: Diagnosis not present

## 2024-09-05 NOTE — Discharge Summary (Signed)
 [facility]     Cardiology Outpatient Procedure (COD) / Same Day PCI                                     Discharge Summary   Admit Date: 09/05/2024  Discharge Date: 09/06/2024  Admitting Physician: Murray Aida Khan, MD  Discharge Physician: Khan Murray Aida, MD  Primary Care Provider: Marylynn Verneita Kay, MD  Primary Cardiologist: Florencio Kava, MD   Discharge Type:  COD   Admission Diagnoses:  Chronic diastolic congestive heart failure (CMS/HHS-HCC) [I50.32] Coronary artery disease due to calcified coronary lesion [I25.10, I25.84] Essential hypertension [I10] NSTEMI (non-ST elevated myocardial infarction) (CMS/HHS-HCC) [I21.4] Recurrent angina status post coronary stent placement [I20.9, Z95.5] Status post coronary angioplasty [Z98.61] Mixed hyperlipidemia [E78.2]  Discharge Diagnoses:  Active Problems:   * No active hospital problems. * Resolved Problems:   * No resolved hospital problems. *     Anticipatory Guidance (key med changes, results pending, future labs, IV therapies):  Possible stent thrombosis of recently placed proximal RCA stent and moderate severe ISR in the mid and distal RCA S/p POBA with noncompliant balloon and DCB of the entire RCA Change Clopidogrel  to Prasugrel 5 mg once daily (dose adjusted for age) DAPT for at least 12 months Follow up with Dr. Florencio in ~ 1 month Referral placed for cardiac rehab    Cardiac Rehab: ordered  Patient Discharge Instructions:   Ambulatory Referral to Cardiac Rehab  Referral Priority: Routine Referral Type: Consultation  Number of Visits Requested: 1   If you smoke (or have smoked within the last year), we strongly recommend that you do not smoke.   Low cholesterol, low fat   Notify cardiology provider of chest pain   Notify provider of swelling in arms, legs, or stomach   Notify provider temperature greater than 101.0 F (38.3 C) degrees   Notify  provider of weight gain greater than 2 lbs in 1 day or 5 lbs in 1 week   Report questions or concerns to the [facility] at [phone]   Notify primary care physician of other symptoms   For a life-threatening emergency, call 911   Follow-up with Primary Care Provider   Follow-up with Cardiology   Light activity  Order Comments: Please see additional instructions regarding post procedure activity   Cardiac Rehab  Order Comments: Indication(s): PCI/stent occuring on the following date(s): 09/05/24   Special instructions- New Stent Medication  Order Comments: A new medication called Prasugrel (Effient) has been prescribed for you to help keep your new stent open. It is important that you take the medication everyday as prescribed and do not stop taking this medication without speaking to your cardiologist first. This medication replaces Plavix  (Clopidogrel )   Special Instructions- New Medical Card Issued  Order Comments: You should have received a medical card following your procedure this admission. Please keep this card with you at all times, as this contains pertinent information related to your procedure for future health care.      Future Appointments  Date Time Provider Department Center  10/06/2024 10:45 AM Florencio Kava Endow, MD [facility]  01/15/2025  2:30 PM Therisa Bi, MD [facility]  01/16/2025 10:45 AM Theotis Lavelle BRAVO, MD [facility]  02/28/2025 10:45 AM  Callwood, Cara Endow, MD [facility]    Non-[facility] Provider Follow-up: none  Report Issues: By calling PCP, cardiologist or the [facility] at [phone].     Allergies/Intolerances:  Allergies  Allergen Reactions  . Penicillins Hives  . Hydrocodone -Acetaminophen  Nausea  . Statins-Hmg-Coa Reductase Inhibitors Muscle Pain     Medications:     Discharge Medications     New Medications      Details  prasugreL HCl 5 mg tablet Commonly known as:  EFFIENT Start taking on: September 07, 2024  5 mg, Oral, Daily Quantity: 30 tablet Refills: 11       Medications To Continue      Details  AIRBORNE ELDERBERRY ORAL  Take by mouth Refills: 0   albuterol  MDI (PROVENTIL , VENTOLIN , PROAIR ) HFA 90 mcg/actuation inhaler  2 inhalations, Inhalation, Every 6 hours PRN Quantity: 18 g Refills: 4   aspirin  81 MG EC tablet  81 mg, Daily Refills: 0   EMERGEN-C ORAL  Take by mouth Refills: 0   ENTRESTO  49-51 mg tablet Generic drug: sacubitriL -valsartan   1 tablet, Oral, 2 times Daily Quantity: 180 tablet Refills: 3   FUROsemide  40 MG tablet Commonly known as: LASIX   40 mg, Oral, Daily Quantity: 90 tablet Refills: 0   isosorbide  mononitrate 30 MG ER tablet Commonly known as: IMDUR   30 mg, Oral, Daily Quantity: 90 tablet Refills: 3   JARDIANCE  25 mg tablet Generic drug: empagliflozin   25 mg, Daily before breakfast Refills: 0   metoprolol  SUCCinate 200 MG XL tablet Commonly known as: TOPROL -XL  200 mg, Oral, Daily Quantity: 90 tablet Refills: 3   nitroGLYcerin  0.4 MG SL tablet Commonly known as: NITROSTAT   0.4 mg, Sublingual, Every 5 min PRN Quantity: 25 tablet Refills: 0   pantoprazole  40 MG DR tablet Commonly known as: PROTONIX   40 mg Refills: 0   PRALUENT PEN 75 mg/mL pen injector Generic drug: alirocumab  75 mg, Subcutaneous, Every 14 days Quantity: 6 mL Refills: 3   ranolazine  1,000 mg ER tablet Commonly known as: RANEXA   1,000 mg, Oral, 2 times Daily Quantity: 180 tablet Refills: 3   spironolactone  25 MG tablet Commonly known as: ALDACTONE   25 mg, Oral, Daily Quantity: 30 tablet Refills: 3       Stopped Medications    clopidogreL  75 mg tablet Commonly known as: PLAVIX        Unreviewed Medications      Details  azelastine 137 mcg nasal spray Commonly known as: ASTELIN  1 spray, Both Nares, 2 times Daily Quantity: 30 mL Refills: 4   traZODone  150 MG tablet Commonly known as:  DESYREL   150 mg, Nightly Refills: 0         Aspirin : Prescribed P2Y12-I: Prescribed High Intensity Statin: Contraindicated due to intolerance.  On PCSK9i ACE/ARB/ARNI: Prescribed Beta Blocker: Prescribed Nitroglycerin : Prescribed   Brief History of Present Illness: Per the H&P dated on 09/05/2024: Ms. Stacie Mcdonald is a 78 y.o. female with a PMHX of CAD s/p DES to RCA (01/2024), h/o RCA in-stent rethrombosis (07/2023), HFrEF (EF 35%), angina, breast cancer, CKD, HTN, OSA, GERD, HLD, COPD, OSA on CPAP, and non-insulin-dependent T2DM, who was recently admitted with high risk NSTEMI 08/04/24 and underwent PTCA/DCB to the mid RCA and PTCA/Shockwave lithotripsy/DES to the proximal RCA, felt well for ~ a week and a half and now presents for coronary angiography in setting of recurrent angina.   _____________________  Post-Procedure Course:     See procedure note below. There were no procedure  complications. After the procedure, the patient was observed and discharged home in stable condition on Dual Anti-platelet therapy.   Imaging and Procedures Performed:   LHC/PCI (09/05/24): Impressions:   Severe one-vessel CAD with possible stent thrombosis of the recently placed proximal RCA stent and moderate to severe in-stent restenosis in the mid and distal RCA Left main, LAD and LCx unchanged with mild to moderate diffuse disease Successful PCI to RCA RRA 554F sheath 554F JR4 guide Choice PT extra-support wire IVUS with underexpansion of the proximal stent Predilated the proximal stent with a 3.0 x 15 mm noncompliant balloon followed by a 3.0 x 15 mm OPN balloon with good expansion Repeat angiography demonstrated moderate to severe disease in the mid and distal vessel Therefore predilated mid and distal with a 3.0 x 20 mm OPN balloon with good expansion Performed drug-coated balloon angioplasty for the length of the vessel with a 3.0 x 30 mm balloon distally, a 3.0 x 30 mm balloon in the mid vessel  and a 3.5 x 30 mm balloon in the proximal vessel Repeat angiography, no residual stenosis, no evidence of dissection and TIMI-3 flow in the vessel Wire, guide and sheath were removed, and a TR band was applied with adequate hemostasis   Recommendations:   Risk factor modification and Secondary prevention measures Aggressive medical therapy for CAD Routine post-cath PCI care; sheath out, TR band applied Refer for cardiac rehab Aspirin  81 mg lifelong P2Y12 inhibitor for at least 12 months Avoid elective surgery while receiving a P2Y12 inhibitor To CVSSU for further care Discussed with patient and family Discussed with Dr. Florencio (referring physician)   _____________________  Discharge Exam:  BP 138/69   Pulse 88   Temp 36.9 C (98.5 F) (Axillary Manual)   Resp 23   Ht 162.6 cm (5' 4.02)   Wt 78.8 kg (173 lb 11.6 oz)   SpO2 94%   BMI 29.80 kg/m   General: alert, cooperative, and in NAD Respiratory: regular rate, symmetric, unlabored, clear to auscultation bilaterally, and no accessory muscle use Cardiac: regular rate, regular rhythm, S1, S2 present, no murmur, no rub, no gallop, and JVD not elevated Abdomen: normal bowel sounds, soft, nontender, and nondistended Extremities: extremities warm and well perfused, no clubbing or cyanosis, no edema, and distal pulses intact Vascular Access Site: left and radial without tenderness, ecchymosis, hematoma or bruit  Pertinent Lab Testing:  BMP: Recent Labs  Lab 09/06/24 0449  NA 140  K 4.3  CL 107  CO2 24  BUN 15  CREATININE 0.8  GLUCOSE 100  CALCIUM  8.9   CBC: Recent Labs  Lab 09/06/24 0449  WBC 8.1  HGB 13.6  HCT 42.4  PLT 230    ____________________  Time spent on discharge process: >30 minutes  PAIGE A ROSSMAN, PA

## 2024-09-06 ENCOUNTER — Emergency Department

## 2024-09-06 ENCOUNTER — Observation Stay
Admission: EM | Admit: 2024-09-06 | Discharge: 2024-09-07 | Disposition: A | Discharge status: Home / Self Care | Source: Ambulatory Visit | Attending: Internal Medicine | Admitting: Internal Medicine

## 2024-09-06 ENCOUNTER — Other Ambulatory Visit: Payer: Self-pay

## 2024-09-06 DIAGNOSIS — G4733 Obstructive sleep apnea (adult) (pediatric): Secondary | ICD-10-CM | POA: Diagnosis not present

## 2024-09-06 DIAGNOSIS — E782 Mixed hyperlipidemia: Secondary | ICD-10-CM | POA: Diagnosis not present

## 2024-09-06 DIAGNOSIS — E1169 Type 2 diabetes mellitus with other specified complication: Secondary | ICD-10-CM | POA: Diagnosis not present

## 2024-09-06 DIAGNOSIS — I252 Old myocardial infarction: Secondary | ICD-10-CM | POA: Diagnosis not present

## 2024-09-06 DIAGNOSIS — M1711 Unilateral primary osteoarthritis, right knee: Secondary | ICD-10-CM | POA: Diagnosis not present

## 2024-09-06 DIAGNOSIS — Z888 Allergy status to other drugs, medicaments and biological substances status: Secondary | ICD-10-CM | POA: Diagnosis not present

## 2024-09-06 DIAGNOSIS — R9431 Abnormal electrocardiogram [ECG] [EKG]: Secondary | ICD-10-CM | POA: Diagnosis not present

## 2024-09-06 DIAGNOSIS — E1129 Type 2 diabetes mellitus with other diabetic kidney complication: Secondary | ICD-10-CM | POA: Diagnosis not present

## 2024-09-06 DIAGNOSIS — M11261 Other chondrocalcinosis, right knee: Secondary | ICD-10-CM | POA: Diagnosis not present

## 2024-09-06 DIAGNOSIS — E663 Overweight: Secondary | ICD-10-CM | POA: Diagnosis not present

## 2024-09-06 DIAGNOSIS — Z7984 Long term (current) use of oral hypoglycemic drugs: Secondary | ICD-10-CM | POA: Diagnosis not present

## 2024-09-06 DIAGNOSIS — M25461 Effusion, right knee: Secondary | ICD-10-CM | POA: Diagnosis not present

## 2024-09-06 DIAGNOSIS — M10061 Idiopathic gout, right knee: Secondary | ICD-10-CM | POA: Diagnosis not present

## 2024-09-06 DIAGNOSIS — I10 Essential (primary) hypertension: Secondary | ICD-10-CM | POA: Diagnosis not present

## 2024-09-06 DIAGNOSIS — M25561 Pain in right knee: Secondary | ICD-10-CM | POA: Diagnosis not present

## 2024-09-06 DIAGNOSIS — N189 Chronic kidney disease, unspecified: Secondary | ICD-10-CM | POA: Diagnosis not present

## 2024-09-06 DIAGNOSIS — N1831 Chronic kidney disease, stage 3a: Secondary | ICD-10-CM | POA: Insufficient documentation

## 2024-09-06 DIAGNOSIS — Z79899 Other long term (current) drug therapy: Secondary | ICD-10-CM | POA: Diagnosis not present

## 2024-09-06 DIAGNOSIS — M2580 Other specified joint disorders, unspecified joint: Secondary | ICD-10-CM | POA: Diagnosis not present

## 2024-09-06 DIAGNOSIS — I5032 Chronic diastolic (congestive) heart failure: Secondary | ICD-10-CM | POA: Diagnosis not present

## 2024-09-06 DIAGNOSIS — I2510 Atherosclerotic heart disease of native coronary artery without angina pectoris: Secondary | ICD-10-CM | POA: Insufficient documentation

## 2024-09-06 DIAGNOSIS — J45909 Unspecified asthma, uncomplicated: Secondary | ICD-10-CM | POA: Insufficient documentation

## 2024-09-06 DIAGNOSIS — Z955 Presence of coronary angioplasty implant and graft: Secondary | ICD-10-CM | POA: Diagnosis not present

## 2024-09-06 DIAGNOSIS — E785 Hyperlipidemia, unspecified: Secondary | ICD-10-CM | POA: Diagnosis not present

## 2024-09-06 DIAGNOSIS — J449 Chronic obstructive pulmonary disease, unspecified: Secondary | ICD-10-CM | POA: Diagnosis not present

## 2024-09-06 DIAGNOSIS — I2584 Coronary atherosclerosis due to calcified coronary lesion: Secondary | ICD-10-CM | POA: Diagnosis not present

## 2024-09-06 DIAGNOSIS — I5022 Chronic systolic (congestive) heart failure: Secondary | ICD-10-CM | POA: Diagnosis not present

## 2024-09-06 DIAGNOSIS — Z9010 Acquired absence of unspecified breast and nipple: Secondary | ICD-10-CM | POA: Diagnosis not present

## 2024-09-06 DIAGNOSIS — M1120 Other chondrocalcinosis, unspecified site: Secondary | ICD-10-CM | POA: Diagnosis not present

## 2024-09-06 DIAGNOSIS — R262 Difficulty in walking, not elsewhere classified: Secondary | ICD-10-CM | POA: Diagnosis not present

## 2024-09-06 DIAGNOSIS — M7989 Other specified soft tissue disorders: Secondary | ICD-10-CM | POA: Diagnosis not present

## 2024-09-06 DIAGNOSIS — I130 Hypertensive heart and chronic kidney disease with heart failure and stage 1 through stage 4 chronic kidney disease, or unspecified chronic kidney disease: Secondary | ICD-10-CM | POA: Insufficient documentation

## 2024-09-06 DIAGNOSIS — K219 Gastro-esophageal reflux disease without esophagitis: Secondary | ICD-10-CM | POA: Diagnosis not present

## 2024-09-06 DIAGNOSIS — Z853 Personal history of malignant neoplasm of breast: Secondary | ICD-10-CM | POA: Diagnosis not present

## 2024-09-06 DIAGNOSIS — E1122 Type 2 diabetes mellitus with diabetic chronic kidney disease: Secondary | ICD-10-CM | POA: Diagnosis not present

## 2024-09-06 DIAGNOSIS — Z7982 Long term (current) use of aspirin: Secondary | ICD-10-CM | POA: Diagnosis not present

## 2024-09-06 DIAGNOSIS — I25110 Atherosclerotic heart disease of native coronary artery with unstable angina pectoris: Secondary | ICD-10-CM | POA: Diagnosis not present

## 2024-09-06 DIAGNOSIS — J849 Interstitial pulmonary disease, unspecified: Secondary | ICD-10-CM | POA: Diagnosis not present

## 2024-09-06 DIAGNOSIS — M25861 Other specified joint disorders, right knee: Secondary | ICD-10-CM | POA: Diagnosis not present

## 2024-09-06 DIAGNOSIS — Z880 Allergy status to penicillin: Secondary | ICD-10-CM | POA: Diagnosis not present

## 2024-09-06 DIAGNOSIS — Z885 Allergy status to narcotic agent status: Secondary | ICD-10-CM | POA: Diagnosis not present

## 2024-09-06 LAB — COMPREHENSIVE METABOLIC PANEL WITH GFR
ALT: 17 U/L (ref 0–44)
AST: 25 U/L (ref 15–41)
Albumin: 4.8 g/dL (ref 3.5–5.0)
Alkaline Phosphatase: 54 U/L (ref 38–126)
Anion gap: 16 — ABNORMAL HIGH (ref 5–15)
BUN: 16 mg/dL (ref 8–23)
CO2: 24 mmol/L (ref 22–32)
Calcium: 10.3 mg/dL (ref 8.9–10.3)
Chloride: 101 mmol/L (ref 98–111)
Creatinine, Ser: 0.8 mg/dL (ref 0.44–1.00)
GFR, Estimated: >60 mL/min (ref >60)
Glucose, Bld: 111 mg/dL — ABNORMAL HIGH (ref 70–99)
Potassium: 4.2 mmol/L (ref 3.5–5.1)
Sodium: 141 mmol/L (ref 135–145)
Total Bilirubin: 0.8 mg/dL (ref 0.0–1.2)
Total Protein: 9.1 g/dL — ABNORMAL HIGH (ref 6.5–8.1)

## 2024-09-06 LAB — CBC WITH DIFFERENTIAL/PLATELET
Abs Immature Granulocytes: 0.03 K/uL (ref 0.00–0.07)
Basophils Absolute: 0 K/uL (ref 0.0–0.1)
Basophils Relative: 0 %
Eosinophils Absolute: 0 K/uL (ref 0.0–0.5)
Eosinophils Relative: 0 %
HCT: 44.7 % (ref 36.0–46.0)
Hemoglobin: 14.5 g/dL (ref 12.0–15.0)
Immature Granulocytes: 0 %
Lymphocytes Relative: 12 %
Lymphs Abs: 1.2 K/uL (ref 0.7–4.0)
MCH: 30.8 pg (ref 26.0–34.0)
MCHC: 32.4 g/dL (ref 30.0–36.0)
MCV: 94.9 fL (ref 80.0–100.0)
Monocytes Absolute: 0.9 K/uL (ref 0.1–1.0)
Monocytes Relative: 8 %
Neutro Abs: 8.3 K/uL — ABNORMAL HIGH (ref 1.7–7.7)
Neutrophils Relative %: 80 %
Platelets: 267 K/uL (ref 150–400)
RBC: 4.71 MIL/uL (ref 3.87–5.11)
RDW: 13.5 % (ref 11.5–15.5)
WBC: 10.5 K/uL (ref 4.0–10.5)
nRBC: 0 % (ref 0.0–0.2)

## 2024-09-06 LAB — PROTIME-INR
INR: 1 (ref 0.8–1.2)
Prothrombin Time: 14.3 s (ref 11.4–15.2)

## 2024-09-06 LAB — SYNOVIAL FLUID, CRYSTAL

## 2024-09-06 LAB — APTT: aPTT: 28 s (ref 24–36)

## 2024-09-06 LAB — BRAIN NATRIURETIC PEPTIDE: B Natriuretic Peptide: 741.4 pg/mL — ABNORMAL HIGH (ref 0.0–100.0)

## 2024-09-06 MED ORDER — LORATADINE 10 MG PO TABS: 10.0000 mg | ORAL_TABLET | Freq: Every day | ORAL | Status: DC | PRN

## 2024-09-06 MED ORDER — HYDRALAZINE HCL 20 MG/ML IJ SOLN: 5.0000 mg | INTRAMUSCULAR | Status: DC | PRN

## 2024-09-06 MED ORDER — ISOSORBIDE MONONITRATE ER 30 MG PO TB24
30.0000 mg | ORAL_TABLET | Freq: Every day | ORAL | Status: DC
  Administered 2024-09-07: 30 mg via ORAL
  Filled 2024-09-06: qty 1

## 2024-09-06 MED ORDER — ASPIRIN 81 MG PO TBEC
81.0000 mg | DELAYED_RELEASE_TABLET | Freq: Every day | ORAL | Status: DC
  Administered 2024-09-07: 81 mg via ORAL
  Filled 2024-09-06: qty 1

## 2024-09-06 MED ORDER — PREDNISONE 20 MG PO TABS
60.0000 mg | ORAL_TABLET | Freq: Once | ORAL | Status: AC
  Administered 2024-09-06: 60 mg via ORAL
  Filled 2024-09-06: qty 3

## 2024-09-06 MED ORDER — FUROSEMIDE 40 MG PO TABS
40.0000 mg | ORAL_TABLET | Freq: Every day | ORAL | Status: DC
  Administered 2024-09-07: 40 mg via ORAL
  Filled 2024-09-06: qty 1

## 2024-09-06 MED ORDER — DM-GUAIFENESIN ER 30-600 MG PO TB12: 1.0000 | ORAL_TABLET | Freq: Two times a day (BID) | ORAL | Status: DC | PRN

## 2024-09-06 MED ORDER — LIDOCAINE HCL (PF) 1 % IJ SOLN
10.0000 mL | Freq: Once | INTRAMUSCULAR | Status: AC
  Administered 2024-09-06: 10 mL
  Filled 2024-09-06: qty 10

## 2024-09-06 MED ORDER — HEPARIN SODIUM (PORCINE) 5000 UNIT/ML IJ SOLN
5000.0000 [IU] | Freq: Three times a day (TID) | INTRAMUSCULAR | Status: DC
  Administered 2024-09-06 – 2024-09-07 (×3): 5000 [IU] via SUBCUTANEOUS
  Filled 2024-09-06 (×3): qty 1

## 2024-09-06 MED ORDER — METOPROLOL SUCCINATE ER 50 MG PO TB24
200.0000 mg | ORAL_TABLET | Freq: Every day | ORAL | Status: DC
  Administered 2024-09-07: 200 mg via ORAL
  Filled 2024-09-06: qty 4

## 2024-09-06 MED ORDER — TRAZODONE HCL 50 MG PO TABS
150.0000 mg | ORAL_TABLET | Freq: Every day | ORAL | Status: DC
  Administered 2024-09-06: 150 mg via ORAL
  Filled 2024-09-06: qty 1

## 2024-09-06 MED ORDER — PANTOPRAZOLE SODIUM 40 MG PO TBEC
40.0000 mg | DELAYED_RELEASE_TABLET | Freq: Every day | ORAL | Status: DC
  Administered 2024-09-07: 40 mg via ORAL
  Filled 2024-09-06: qty 1

## 2024-09-06 MED ORDER — D-MANNOSE 500 MG PO CAPS
500.0000 mg | ORAL_CAPSULE | Freq: Every day | ORAL | Status: DC
Start: 2024-09-07 — End: 2024-09-06

## 2024-09-06 MED ORDER — PRASUGREL HCL 10 MG PO TABS: 5.0000 mg | ORAL_TABLET | Freq: Every day | ORAL | Status: DC

## 2024-09-06 MED ORDER — ONDANSETRON HCL 4 MG/2ML IJ SOLN: 4.0000 mg | Freq: Three times a day (TID) | INTRAMUSCULAR | Status: DC | PRN

## 2024-09-06 MED ORDER — MORPHINE SULFATE (PF) 4 MG/ML IV SOLN
4.0000 mg | Freq: Once | INTRAVENOUS | Status: AC
  Administered 2024-09-06: 4 mg via INTRAVENOUS
  Filled 2024-09-06: qty 1

## 2024-09-06 MED ORDER — PRASUGREL HCL 10 MG PO TABS
5.0000 mg | ORAL_TABLET | Freq: Every day | ORAL | Status: DC
  Administered 2024-09-07: 5 mg via ORAL
  Filled 2024-09-06: qty 1

## 2024-09-06 MED ORDER — OXYCODONE-ACETAMINOPHEN 5-325 MG PO TABS
1.0000 | ORAL_TABLET | ORAL | Status: DC | PRN
Start: 2024-09-06 — End: 2024-09-07
  Administered 2024-09-06 – 2024-09-07 (×2): 1 via ORAL
  Filled 2024-09-06 (×2): qty 1

## 2024-09-06 MED ORDER — NITROGLYCERIN 0.4 MG SL SUBL: 0.4000 mg | SUBLINGUAL_TABLET | SUBLINGUAL | Status: DC | PRN

## 2024-09-06 MED ORDER — PREDNISONE 20 MG PO TABS
30.0000 mg | ORAL_TABLET | Freq: Every day | ORAL | Status: DC
  Administered 2024-09-07: 30 mg via ORAL
  Filled 2024-09-06: qty 1

## 2024-09-06 MED ORDER — GLUCOSAMINE-CHONDROITIN PO CAPS: ORAL_CAPSULE | Freq: Every day | ORAL | Status: DC

## 2024-09-06 MED ORDER — SPIRONOLACTONE 25 MG PO TABS
25.0000 mg | ORAL_TABLET | Freq: Every day | ORAL | Status: DC
  Administered 2024-09-07: 25 mg via ORAL
  Filled 2024-09-06: qty 1

## 2024-09-06 MED ORDER — RANOLAZINE ER 500 MG PO TB12
1000.0000 mg | ORAL_TABLET | Freq: Two times a day (BID) | ORAL | Status: DC
  Administered 2024-09-06 – 2024-09-07 (×2): 1000 mg via ORAL
  Filled 2024-09-06 (×4): qty 2

## 2024-09-06 MED ORDER — ACETAMINOPHEN 325 MG PO TABS: 650.0000 mg | ORAL_TABLET | Freq: Four times a day (QID) | ORAL | Status: DC | PRN

## 2024-09-06 MED ORDER — SACUBITRIL-VALSARTAN 49-51 MG PO TABS
1.0000 | ORAL_TABLET | Freq: Two times a day (BID) | ORAL | Status: DC
  Administered 2024-09-06 – 2024-09-07 (×2): 1 via ORAL
  Filled 2024-09-06 (×4): qty 1

## 2024-09-06 MED ORDER — ONDANSETRON HCL 4 MG/2ML IJ SOLN
4.0000 mg | Freq: Once | INTRAMUSCULAR | Status: AC
  Administered 2024-09-06: 4 mg via INTRAVENOUS
  Filled 2024-09-06: qty 2

## 2024-09-06 MED ORDER — ELDERBERRY 500 MG PO CAPS: 1000.0000 mg | ORAL_CAPSULE | Freq: Every day | ORAL | Status: DC

## 2024-09-06 MED ORDER — HEPARIN SODIUM (PORCINE) 5000 UNIT/ML IJ SOLN: 5000.0000 [IU] | Freq: Three times a day (TID) | INTRAMUSCULAR | Status: DC

## 2024-09-06 MED ORDER — ALBUTEROL SULFATE (2.5 MG/3ML) 0.083% IN NEBU: 3.0000 mL | INHALATION_SOLUTION | RESPIRATORY_TRACT | Status: DC | PRN

## 2024-09-06 NOTE — Progress Notes (Signed)
 PHARMACIST - PHYSICIAN ORDER COMMUNICATION  CONCERNING: P&T Medication Policy on Herbal Medications  DESCRIPTION:  This patient's orders for:  D-Mannose capsules, Elderberry capsules, and Glucosamine-Chondroitin capsules  have been noted.  This product(s) is classified as an "herbal" or natural product. Due to a lack of definitive safety studies or FDA approval, nonstandard manufacturing practices, plus the potential risk of unknown drug-drug interactions while on inpatient medications, the Pharmacy and Therapeutics Committee does not permit the use of "herbal" or natural products of this type within [facility].   ACTION TAKEN: The pharmacy department is unable to verify this order at this time and your patient has been informed of this safety policy. Please reevaluate patient's clinical condition at discharge and address if the herbal or natural product(s) should be resumed at that time.

## 2024-09-06 NOTE — ED Notes (Signed)
 Fall risk bracelet on, non skid shoes on, chair alarm on, and call bell near pt.

## 2024-09-06 NOTE — ED Provider Notes (Addendum)
 [facility] Provider Note    Event Date/Time   First MD Initiated Contact with Patient 09/06/24 1545     (approximate)   History   Knee Pain   HPI  Stacie Mcdonald is a 78 y.o. female with a history of CHF,  and CAD status post recent catheterization who presents with right knee pain, atraumatic, persisting over the last several days, now severe in intensity to the point where she cannot walk or bear weight on it.  She states that it started hurting during the night while she was admitted to [facility] last week.  She reports some swelling to the knee but not to the lower leg or thigh.  She has had difficulty bending the leg and cannot put any weight on it.  She states that she has had a couple of prior episodes like this of atraumatic spontaneous severe knee pain however this is the most severe.  I reviewed the past medical records.  The patient was just admitted last week at [facility] for cardiac catheterization and discharged today.   Physical Exam   Triage Vital Signs: ED Triage Vitals  Encounter Vitals Group     BP 09/06/24 1435 (!) 120/57     Girls Systolic BP Percentile --      Girls Diastolic BP Percentile --      Boys Systolic BP Percentile --      Boys Diastolic BP Percentile --      Pulse Rate 09/06/24 1435 95     Resp 09/06/24 1435 19     Temp 09/06/24 1435 98.4 F (36.9 C)     Temp Source 09/06/24 1435 Oral     SpO2 09/06/24 1435 95 %     Weight 09/06/24 1441 171 lb 8.3 oz (77.8 kg)     Height --      Head Circumference --      Peak Flow --      Pain Score 09/06/24 1440 8     Pain Loc --      Pain Education --      Exclude from Growth Chart --     Most recent vital signs: Vitals:   09/06/24 1435 09/06/24 1909  BP: (!) 120/57 (!) 120/58  Pulse: 95 99  Resp: 19 20  Temp: 98.4 F (36.9 C) 99.2 F (37.3 C)  SpO2: 95% 96%     General: Awake, no distress.  CV:  Good peripheral perfusion.  Resp:  Normal effort.  Abd:  No distention.   Other:  Right knee effusion and tenderness with warmth.  Pain on any ROM passively.  The patient is able to flex and extend the knee in approximately a 15 to 20 degree range.  There is no overlying erythema or other cutaneous findings.  There are no open wounds.   ED Results / Procedures / Treatments   Labs (all labs ordered are listed, but only abnormal results are displayed) Labs Reviewed  CBC WITH DIFFERENTIAL/PLATELET - Abnormal; Notable for the following components:      Result Value   Neutro Abs 8.3 (*)    All other components within normal limits  COMPREHENSIVE METABOLIC PANEL WITH GFR - Abnormal; Notable for the following components:   Glucose, Bld 111 (*)    Total Protein 9.1 (*)    Anion gap 16 (*)    All other components within normal limits  BODY FLUID CULTURE W GRAM STAIN  SYNOVIAL FLUID, CRYSTAL  GLUCOSE, BODY FLUID OTHER  PROTEIN, BODY FLUID (OTHER)  BRAIN NATRIURETIC PEPTIDE  BASIC METABOLIC PANEL WITH GFR  CBC  PROTIME-INR  APTT     EKG    RADIOLOGY  XR R knee: I independently viewed and interpreted the images; there is arthritis with no acute fracture  PROCEDURES:  Critical Care performed: No  .Joint Aspiration/Arthrocentesis  Date/Time: 09/06/2024 5:23 PM  Performed by: Jacolyn Pae, MD Authorized by: Jacolyn Pae, MD   Consent:    Consent obtained:  Verbal   Consent given by:  Patient   Risks discussed:  Bleeding, infection, pain, incomplete drainage and nerve damage   Alternatives discussed:  Alternative treatment and observation Universal protocol:    Patient identity confirmed:  Verbally with patient Location:    Location:  Knee   Knee:  R knee Anesthesia:    Anesthesia method:  Local infiltration   Local anesthetic:  Lidocaine  1% w/o epi Procedure details:    Needle gauge:  18 G   Approach:  Medial   Aspirate amount:  3mL   Aspirate characteristics:  Blood-tinged   Specimen collected: yes    Post-procedure details:    Dressing:  Sterile dressing   Procedure completion:  Tolerated well, no immediate complications    MEDICATIONS ORDERED IN ED: Medications  albuterol  (PROVENTIL ) (2.5 MG/3ML) 0.083% nebulizer solution 3 mL (has no administration in time range)  dextromethorphan-guaiFENesin  (MUCINEX  DM) 30-600 MG per 12 hr tablet 1 tablet (has no administration in time range)  ondansetron  (ZOFRAN ) injection 4 mg (has no administration in time range)  hydrALAZINE  (APRESOLINE ) injection 5 mg (has no administration in time range)  acetaminophen  (TYLENOL ) tablet 650 mg (has no administration in time range)  oxyCODONE -acetaminophen  (PERCOCET/ROXICET) 5-325 MG per tablet 1 tablet (has no administration in time range)  heparin  injection 5,000 Units (has no administration in time range)  lidocaine  (PF) (XYLOCAINE ) 1 % injection 10 mL (10 mLs Other Given by Other 09/06/24 1725)  morphine  (PF) 4 MG/ML injection 4 mg (4 mg Intravenous Given 09/06/24 1652)  ondansetron  (ZOFRAN ) injection 4 mg (4 mg Intravenous Given 09/06/24 1647)  predniSONE  (DELTASONE ) tablet 60 mg (60 mg Oral Given 09/06/24 1900)  morphine  (PF) 4 MG/ML injection 4 mg (4 mg Intravenous Given 09/06/24 1905)     IMPRESSION / MDM / ASSESSMENT AND PLAN / ED COURSE  I reviewed the triage vital signs and the nursing notes.  78 year old female with PMH as noted above presents with atraumatic right knee pain for the last several days, with inability to bear weight or flex/extend the knee.  Differential diagnosis includes, but is not limited to, exacerbation of osteoarthritis/degenerative joint disease, gout, less likely septic arthritis, DVT, Baker's cyst.  We will perform an arthrocentesis, obtain an ultrasound, and reassess.  Patient's presentation is most consistent with acute presentation with potential threat to life or bodily function.  ----------------------------------------- 7:29 PM on  09/06/2024 -----------------------------------------  Ultrasound shows no evidence of DVT.  I was only able to obtain a very small sample of synovial fluid, which was not sufficient quantity for a cell count.  However, the fluid was run for crystals and shows pyrophosphate crystals consistent with pseudogout.  Although I cannot definitively rule out septic arthritis without a cell count, clinically my suspicion is very low and there is no indication for a repeat attempted arthrocentesis at this time..  Since the patient has relatively severe acute pain, has required multiple doses of pain medication, and cannot ambulate, she will need admission for further management.  I have ordered  prednisone  as well.  I consulted Dr. Hilma from the hospitalist service; based on our discussion he agrees to evaluate patient for admission.   FINAL CLINICAL IMPRESSION(S) / ED DIAGNOSES   Final diagnoses:  Pseudogout     Rx / DC Orders   ED Discharge Orders     None        Note:  This document was prepared using Dragon voice recognition software and may include unintentional dictation errors.    Jacolyn Pae, MD 09/06/24 HAL    Jacolyn Pae, MD 09/06/24 [id number]

## 2024-09-06 NOTE — ED Triage Notes (Signed)
 C/O right knee pain, swelling x 1 day. KC concerned for septic joint, so referred to ED.  NSTEMI in September and cardiac cath Thursday and discharged from [facility] today.  Unable to bear weight on knee.

## 2024-09-06 NOTE — H&P (Incomplete)
 History and Physical    Stacie Mcdonald FMW:[id number] DOB: Dec 27, 1945 DOA: 09/06/2024  Referring MD/NP/PA:   PCP: Marylynn Verneita CROME, MD   Patient coming from:  The patient is coming from home.     Chief Complaint: Right knee pain  HPI: Stacie Mcdonald is a 78 y.o. female with medical history significant of CAD with stent, HTN, HLD, DM, COPD/asthma, sCHF with EF  4-50%, CKD-3a, ILD, OSA on CPAP, breast cancer in remission (s/p of right mastectomy, chemotherapy and radiation therapy), who presents with right knee pain.  Patient was recently hospitalized to [facility], just discharged yesterday due to possible stent thrombosis of recently placed proximal RCA stent and moderate severe ISR in the mid and distal RCA, pt is s/p POBA with noncompliant balloon and DCB of the entire RCA.  She states that she started having right knee swelling and pain since yesterday, which has been progressively worsening.  The pain is constant, aching, moderate to severe, nonradiating, aggravated by movement.  No fever or chills.  Patient does not have chest pain, cough, SOB.  No nausea, vomiting, diarrhea or abdominal pain.  No symptoms of UTI.  ED physician did diagnostic aspiration, the initial synovial fluid analysis is consistent with pseudogout, showing calcium  pyrophosphate crystals.  Data reviewed independently and ED Course: pt was found to have WBC 10.5, GFR> 60, temperature 99.2, blood pressure 120/58, heart rate 99, RR 20, oxygen saturation 96% on room air.  X-ray of right knee negative for bony fracture.  Lower extremity venous Doppler negative for DVT in right leg.  Patient is placed in telemetry bed for observation.   EKG: I have personally reviewed.  Sinus rhythm, QTc 513, LAE, poor R wave progression, LAD, occasional PVC   Review of Systems:   General: no fevers, chills, no body weight gain, has fatigue HEENT: no blurry vision, hearing changes or sore throat Respiratory: no dyspnea, coughing,  wheezing CV: no chest pain, no palpitations GI: no nausea, vomiting, abdominal pain, diarrhea, constipation GU: no dysuria, burning on urination, increased urinary frequency, hematuria  Ext: has trace leg edema  Neuro: no unilateral weakness, numbness, or tingling, no vision change or hearing loss Skin: no rash, no skin tear. MSK: Has right knee swelling and pain Heme: No easy bruising.  Travel history: No recent long distant travel.   Allergy:  Allergies  Allergen Reactions   Penicillins Hives    Has patient had a PCN reaction causing immediate rash, facial/tongue/throat swelling, SOB or lightheadedness with hypotension: No Has patient had a PCN reaction causing severe rash involving mucus membranes or skin necrosis: Yes Has patient had a PCN reaction that required hospitalization: No Has patient had a PCN reaction occurring within the last 10 years: Yes If all of the above answers are NO, then may proceed with Cephalosporin use.    Statins     Other reaction(s): Muscle Pain    Past Medical History:  Diagnosis Date   Allergy    Arthritis    fingers, knees   Asthma    Breast Cancer 1991   R mastectomy. XRT , chemo   Breast cancer (HCC) 1991   RT MASTECTOMY   CAD (coronary artery disease) 2008   50% LAD occlusion 2009,  stent in mid LAD 2008 for 95%    CHF (congestive heart failure) (HCC)    Chicken pox    CKD stage 3a, GFR 45-59 ml/min (HCC)    Colon polyps 02/2008   by Colonoscopy, [place]  COPD (chronic obstructive pulmonary disease) (HCC)    Diabetes mellitus without complication (HCC)    GERD (gastroesophageal reflux disease)    Heart disease    Hyperlipidemia    Hypertension    Myocardial infarction (HCC)    silent. 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   T score -2.1   Pseudoaneurysm 02/27/2021    She successful ablation of the pseudoaneurysm   Rheumatic fever    S/P PTCA (percutaneous transluminal coronary angioplasty)     SCC (squamous cell carcinoma), leg, right 01/09/2018   Sleep apnea    CPAP   Stable angina 07/27/2019   Thoracic vertebral fracture (HCC) 04/05/2012   Urinary tract infection    Wears contact lenses    left only    Past Surgical History:  Procedure Laterality Date   BREAST BIOPSY     right breast reconstruction S/P mastectomy   BREAST SURGERY  1991   right mastectomy   CARDIAC CATHETERIZATION  02/07/2024   with stent  [facility]   CATARACT EXTRACTION W/PHACO Left 05/01/2020   Procedure: CATARACT EXTRACTION PHACO AND INTRAOCULAR LENS PLACEMENT (IOC) LEFT 5.45  00:48.6  11.2%;  Surgeon: Mittie Gaskin, MD;  Location: [facility];  Service: Ophthalmology;  Laterality: Left;  sleep apnea   CATARACT EXTRACTION W/PHACO Right 02/23/2024   Procedure: PHACOEMULSIFICATION, CATARACT, WITH IOL INSERTION 7.73 00:34.1;  Surgeon: Mittie Gaskin, MD;  Location: [facility];  Service: Ophthalmology;  Laterality: Right;   COLONOSCOPY WITH PROPOFOL  N/A 08/18/2019   Procedure: COLONOSCOPY WITH PROPOFOL ;  Surgeon: Jinny Carmine, MD;  Location: [facility];  Service: Endoscopy;  Laterality: N/A;  sleep apnea   CORONARY ANGIOPLASTY WITH STENT PLACEMENT  11/2011   mid RCA 75% occlusion resolved to 0%, [place]   CORONARY STENT INTERVENTION N/A 04/07/2018   Procedure: CORONARY STENT INTERVENTION;  Surgeon: Florencio Cara BIRCH, MD;  Location: [facility];  Service: Cardiovascular;  Laterality: N/A;   CORONARY STENT INTERVENTION N/A 02/20/2021   Procedure: CORONARY STENT INTERVENTION;  Surgeon: Florencio Cara BIRCH, MD;  Location: [facility];  Service: Cardiovascular;  Laterality: N/A;   CORONARY STENT INTERVENTION N/A 06/25/2023   Procedure: CORONARY STENT INTERVENTION;  Surgeon: Florencio Cara BIRCH, MD;  Location: [facility];  Service: Cardiovascular;  Laterality: N/A;   LEFT HEART CATH AND CORONARY ANGIOGRAPHY N/A 02/20/2021   Procedure: LEFT HEART  CATH AND CORONARY ANGIOGRAPHY;  Surgeon: Florencio Cara BIRCH, MD;  Location: [facility];  Service: Cardiovascular;  Laterality: N/A;   LEFT HEART CATH AND CORONARY ANGIOGRAPHY Left 06/25/2023   Procedure: LEFT HEART CATH AND CORONARY ANGIOGRAPHY;  Surgeon: Florencio Cara BIRCH, MD;  Location: [facility];  Service: Cardiovascular;  Laterality: Left;   MASTECTOMY Right 1991   BREAST CA   POLYPECTOMY  08/18/2019   Procedure: POLYPECTOMY;  Surgeon: Jinny Carmine, MD;  Location: [facility];  Service: Endoscopy;;   PSEUDOANERYSM COMPRESSION N/A 02/27/2021   Procedure: CLARK COMPRESSION;  Surgeon: Jama Cordella MATSU, MD;  Location: [facility];  Service: Cardiovascular;  Laterality: N/A;   RIGHT/LEFT HEART CATH AND CORONARY ANGIOGRAPHY N/A 04/07/2018   Procedure: RIGHT/LEFT HEART CATH AND CORONARY ANGIOGRAPHY;  Surgeon: Florencio Cara BIRCH, MD;  Location: [facility];  Service: Cardiovascular;  Laterality: N/A;   TONSILLECTOMY     TUBAL LIGATION  1979    Social History:  reports that she has never smoked. She has never used smokeless tobacco. She  reports current alcohol use of about 1.0 standard drink of alcohol per week. She reports that she does not use drugs.  Family History:  Family History  Problem Relation Age of Onset   Heart disease Mother 33       massive MI   Arthritis Mother    Hyperlipidemia Mother    Hypertension Mother    Heart disease Father 64       AMI   Cancer Father        metastatic lung CA   Diabetes Sister    Cancer Maternal Aunt    Breast cancer Maternal Aunt 44   Breast cancer Cousin        51s   Breast cancer Cousin        36s   Cancer Maternal Aunt      Prior to Admission medications   Medication Sig Start Date End Date Taking? Authorizing Provider  albuterol  (VENTOLIN  HFA) 108 (90 Base) MCG/ACT inhaler Inhale 2 puffs into the lungs every 6 (six) hours as needed. Using in Am daily  per MD 07/01/23 08/14/24   [provider]  aspirin  81 MG tablet Take 81 mg by mouth daily.    [provider]  clopidogrel  (PLAVIX ) 75 MG tablet Take 1 tablet (75 mg total) by mouth daily. 08/14/24   Marylynn Verneita CROME, MD  D-MANNOSE PO Take 800 mg by mouth daily.    [provider]  ELDERBERRY PO Take 1,000 mg by mouth daily.    [provider]  empagliflozin  (JARDIANCE ) 25 MG TABS tablet TAKE 1 TABLET BY MOUTH DAILY BEFORE BREAKFAST 06/01/24   Marylynn Verneita CROME, MD  furosemide  (LASIX ) 40 MG tablet Take 40 mg by mouth daily.    [provider]  GLUCOSAMINE-CHONDROITIN PO Take 2 tablets by mouth daily.    [provider]  glucose blood (CONTOUR NEXT TEST) test strip Use to check blood sugar twice daily. 05/08/24   Marylynn Verneita CROME, MD  isosorbide  mononitrate (IMDUR ) 120 MG 24 hr tablet Take 120 mg by mouth daily.     [provider]  Lancets (FREESTYLE) lancets To check blood sugars once daily 04/26/24   Marylynn Verneita CROME, MD  loratadine  (CLARITIN ) 10 MG tablet Take 10 mg by mouth daily as needed for allergies.    [provider]  metoprolol  (TOPROL -XL) 200 MG 24 hr tablet TAKE 1 TABLET BY MOUTH DAILY. 08/14/24   Marylynn Verneita CROME, MD  nitroGLYCERIN  (NITROSTAT ) 0.4 MG SL tablet Place 0.4 mg under the tongue every 5 (five) minutes as needed for chest pain.    [provider]  pantoprazole  (PROTONIX ) 40 MG tablet Take 1 tablet (40 mg total) by mouth daily. 08/14/24   Marylynn Verneita CROME, MD  PRALUENT 75 MG/ML SOAJ Inject 75 mg into the muscle every 14 (fourteen) days. 04/11/19   [provider]  ranolazine  (RANEXA ) 1000 MG SR tablet Take 1,000 mg by mouth 2 (two) times daily. 05/12/24   [provider]  sacubitril -valsartan  (ENTRESTO ) 49-51 MG Take 1 tablet by mouth 2 (two) times daily. 11/27/19   [provider]  spironolactone  (ALDACTONE ) 25 MG tablet Take 1 tablet (25 mg total) by mouth daily. 08/14/24   Marylynn Verneita CROME, MD  traZODone   (DESYREL ) 150 MG tablet Take 1 tablet (150 mg total) by mouth at bedtime. 08/08/24   Marylynn Verneita CROME, MD    Physical Exam: Vitals:   09/06/24 2050 09/06/24 2100 09/06/24 2130 09/06/24 2241  BP:  127/72 ROLLEN)  117/56 128/72  Pulse: (!) 103 96 96 100  Resp: 20 (!) 22 15 18   Temp:    98.4 F (36.9 C)  TempSrc:    Oral  SpO2: 94% 90% 94% 91%  Weight:       General: Not in acute distress HEENT:       Eyes: PERRL, EOMI, no jaundice       ENT: No discharge from the ears and nose, no pharynx injection, no tonsillar enlargement.        Neck: No JVD, no bruit, no mass felt. Heme: No neck lymph node enlargement. Cardiac: S1/S2, RRR, No murmurs, No gallops or rubs. Respiratory: No rales, wheezing, rhonchi or rubs. GI: Soft, nondistended, nontender, no rebound pain, no organomegaly, BS present. GU: No hematuria Ext: Has trace leg edema bilaterally. 1+DP/PT pulse bilaterally. Musculoskeletal: has swelling and tenderness in right knee, no erythema Skin: No rashes.  Neuro: Alert, oriented X3, cranial nerves II-XII grossly intact, moves all extremities normally.  Psych: Patient is not psychotic, no suicidal or hemocidal ideation.  Labs on Admission: I have personally reviewed following labs and imaging studies  CBC: Recent Labs  Lab 09/06/24 1445  WBC 10.5  NEUTROABS 8.3*  HGB 14.5  HCT 44.7  MCV 94.9  PLT 267   Basic Metabolic Panel: Recent Labs  Lab 09/06/24 1445  NA 141  K 4.2  CL 101  CO2 24  GLUCOSE 111*  BUN 16  CREATININE 0.80  CALCIUM  10.3   GFR: Estimated Creatinine Clearance: 57.5 mL/min (by C-G formula based on SCr of 0.8 mg/dL). Liver Function Tests: Recent Labs  Lab 09/06/24 1445  AST 25  ALT 17  ALKPHOS 54  BILITOT 0.8  PROT 9.1*  ALBUMIN 4.8   No results for input(s): LIPASE, AMYLASE in the last 168 hours. No results for input(s): AMMONIA in the last 168 hours. Coagulation Profile: Recent Labs  Lab 09/06/24 2048  INR 1.0   Cardiac  Enzymes: No results for input(s): CKTOTAL, CKMB, CKMBINDEX, TROPONINI in the last 168 hours. BNP (last 3 results) No results for input(s): PROBNP in the last 8760 hours. HbA1C: No results for input(s): HGBA1C in the last 72 hours. CBG: No results for input(s): GLUCAP in the last 168 hours. Lipid Profile: No results for input(s): CHOL, HDL, LDLCALC, TRIG, CHOLHDL, LDLDIRECT in the last 72 hours. Thyroid  Function Tests: No results for input(s): TSH, T4TOTAL, FREET4, T3FREE, THYROIDAB in the last 72 hours. Anemia Panel: No results for input(s): VITAMINB12, FOLATE, FERRITIN, TIBC, IRON, RETICCTPCT in the last 72 hours. Urine analysis:    Component Value Date/Time   BILIRUBINUR neg 09/21/2023 1343   KETONESUR negative 09/18/2023 1035   PROTEINUR Negative 09/21/2023 1343   UROBILINOGEN 0.2 09/21/2023 1343   NITRITE neg 09/21/2023 1343   LEUKOCYTESUR Small (1+) (A) 09/21/2023 1343   Sepsis Labs: @LABRCNTIP (procalcitonin:4,lacticidven:4) )No results found for this or any previous visit (from the past 240 hours).   Radiological Exams on Admission:   Assessment/Plan Principal Problem:   Pseudogout Active Problems:   CAD (coronary artery disease)   Chronic systolic CHF (congestive heart failure) (HCC)   Interstitial pulmonary disease (HCC)   COPD (chronic obstructive pulmonary disease) (HCC)   Essential hypertension   Hyperlipidemia associated with type 2 diabetes mellitus (HCC)   Type II diabetes mellitus with renal manifestations (HCC)   Chronic kidney disease, stage 3a (HCC)   Overweight (BMI 25.0-29.9)   Assessment and Plan:  Pseudogout: Initial synovial fluid analysis showed calcium  pyrophosphate, consistent with  pseudogout.  - Place in telemetry bed for obs - 60 mg of prednisone  was given in ED --> then 30 mg daily - As needed Percocet, Tylenol  for pain - Fall precaution - PT/OT  CAD (coronary artery disease): s/p of  Stent. No CP today. - Prasugrel 5 mg once daily and ASA 81 mg daily - Imdur , Ranexa ,  Chronic systolic CHF (congestive heart failure) (HCC): 2D echo on 02/27/2024 showed EF of 40-50%.  Patient has trace leg edema, no SOB, CHF seem to be compensated. -Continue home Lasix  and spironolactone  - Continue Entresto   Interstitial pulmonary disease and COPD: Stable -Bronchodilators and as needed Mucinex   Essential hypertension -IV hydralazine  as needed - Lasix , spironolactone , Imdur , metoprolol , Entresto   Hyperlipidemia associated with type 2 diabetes mellitus (HCC) -Patient is Praluent injection  Type II diabetes mellitus with renal manifestations (HCC) recent A1c 6.5, well-controlled.  Please is taking Jardiance  -SSI  Chronic kidney disease, stage 3a (HCC): Stable, GFR> 60 - Follow-up with BMP  Overweight (BMI 25.0-29.9): Body weight 77.8 kg, BMI 29.44 - Encourage losing weight - Exercise healthy diet    DVT ppx: SQ Heparin    Code Status: Full code     Family Communication:     not done, no family member is at bed side.     Disposition Plan:  Anticipate discharge back to previous environment  Consults called:  none  Admission status and Level of care: Telemetry Medical:    for obs     Dispo: The patient is from: Home              Anticipated d/c is to: Home              Anticipated d/c date is: 1 day              Patient currently is not medically stable to d/c.    Severity of Illness:  The appropriate patient status for this patient is OBSERVATION. Observation status is judged to be reasonable and necessary in order to provide the required intensity of service to ensure the patient's safety. The patient's presenting symptoms, physical exam findings, and initial radiographic and laboratory data in the context of their medical condition is felt to place them at decreased risk for further clinical deterioration. Furthermore, it is anticipated that the patient will be  medically stable for discharge from the hospital within 2 midnights of admission.        Date of Service 09/07/2024    Caleb Exon [facility]   If 7PM-7AM, please contact night-coverage [email or web address] 09/07/2024, 2:51 AM

## 2024-09-06 NOTE — Progress Notes (Signed)
 Patient meets discharge criteria:  Vital signs returned to baseline  O2 saturations are stable and at baseline  RASS score -1 to +1  Absent of nausea and vomiting.  Skin warm and dry.  Pain score <5 on 0-10 scale or at baseline  Protective reflexes are present  Absent of bladder distention.  Tube, catheters drains patent  Oral temperature > 35.5C  Dressing dry; or marked if drainage is present.   PIV removed with tip intact. Discussed results of procedure with physician and verbalized understanding. Telemetry discontinued.   Patient discharge instructions given to patient and family member. Reviewed sedation, S&S to report to provider, follow-up appointments, prescription medications,  diet, and activity, with patient and family member/friend.   Patient discharged with printed AVS instructions. Patient verbalized understanding and ability to comply. The personal belongings were returned to the patient. Patient and family member/friend state that they have no further questions at this time and will contact the numbers provided if any issues arise.  Patient states they feel comfortable leaving.     Pt transported for discharge by transporter/family/friend in wheelchair and sent home to be cared by self and family/friend.  Discharge time: 09/06/2024  9:39 AM Discharge destination: Home  Signed: CATHERINE FIELDS, RN                 09/06/2024

## 2024-09-07 ENCOUNTER — Encounter: Payer: Self-pay | Admitting: Internal Medicine

## 2024-09-07 ENCOUNTER — Other Ambulatory Visit: Payer: Self-pay

## 2024-09-07 DIAGNOSIS — M1120 Other chondrocalcinosis, unspecified site: Secondary | ICD-10-CM | POA: Diagnosis not present

## 2024-09-07 DIAGNOSIS — M11261 Other chondrocalcinosis, right knee: Secondary | ICD-10-CM | POA: Diagnosis not present

## 2024-09-07 DIAGNOSIS — E1129 Type 2 diabetes mellitus with other diabetic kidney complication: Secondary | ICD-10-CM | POA: Diagnosis not present

## 2024-09-07 LAB — CBC
HCT: 39.5 % (ref 36.0–46.0)
Hemoglobin: 12.8 g/dL (ref 12.0–15.0)
MCH: 30.6 pg (ref 26.0–34.0)
MCHC: 32.4 g/dL (ref 30.0–36.0)
MCV: 94.5 fL (ref 80.0–100.0)
Platelets: 235 K/uL (ref 150–400)
RBC: 4.18 MIL/uL (ref 3.87–5.11)
RDW: 13.5 % (ref 11.5–15.5)
WBC: 9 K/uL (ref 4.0–10.5)
nRBC: 0 % (ref 0.0–0.2)

## 2024-09-07 LAB — GLUCOSE, CAPILLARY
Glucose-Capillary: 136 mg/dL — ABNORMAL HIGH (ref 70–99)
Glucose-Capillary: 153 mg/dL — ABNORMAL HIGH (ref 70–99)
Glucose-Capillary: 175 mg/dL — ABNORMAL HIGH (ref 70–99)

## 2024-09-07 MED ORDER — INSULIN ASPART 100 UNIT/ML IJ SOLN
0.0000 [IU] | Freq: Three times a day (TID) | INTRAMUSCULAR | Status: DC
  Administered 2024-09-07: 1 [IU] via SUBCUTANEOUS
  Administered 2024-09-07 (×2): 2 [IU] via SUBCUTANEOUS
  Filled 2024-09-07 (×3): qty 1

## 2024-09-07 MED ORDER — PREDNISONE 10 MG PO TABS
20.0000 mg | ORAL_TABLET | Freq: Every day | ORAL | 0 refills | Status: AC
  Filled 2024-09-07: qty 4, 2d supply, fill #0

## 2024-09-07 MED ORDER — ORAL CARE MOUTH RINSE: 15.0000 mL | OROMUCOSAL | Status: DC | PRN

## 2024-09-07 MED ORDER — DIPHENHYDRAMINE HCL 50 MG/ML IJ SOLN: 12.5000 mg | Freq: Three times a day (TID) | INTRAMUSCULAR | Status: DC | PRN

## 2024-09-07 MED ORDER — INSULIN ASPART 100 UNIT/ML IJ SOLN: 0.0000 [IU] | Freq: Every day | INTRAMUSCULAR | Status: DC

## 2024-09-07 NOTE — TOC Transition Note (Addendum)
 Transition of Care [facility]) - Discharge Note   Patient Details  Name: Stacie Mcdonald MRN: [id number] Date of Birth: 02/25/1946  [facility]) CM/SW Contact:  Marinda Cooks, RN Phone Number: 09/07/2024, 4:17 PM   Clinical Narrative:    This CM updated by covering MD pt medically cleared to dc today and has active DC order . This CM spoke with pt introduced role and discussed DC plan / recommendations for [facility] PT/OT & DME. Pt offered choice and informed she did not have a preference for the agency used . This CM  confirmed with HH liaison @ [facility]  HH referral accepted & DME referral and arranged or bedside delivery.  DC transportation confirmed for pt with husband  Medical team updated . No additional DC needs requested by medical team or identified by CM at this time .     This CM updated that [facility] will delivered to pt's home per pt's request Covering Md updated  to place [facility] ordered.     Final next level of care: Home w Home Health Services Barriers to Discharge: No Barriers Identified   Patient Goals and CMS Choice   CMS Medicare.gov Compare Post Acute Care list provided to:: Patient Choice offered to / list presented to : Patient      Discharge Placement                       Discharge Plan and Services Additional resources added to the After Visit Summary for                  DME Arranged: 3-N-1, Walker rolling DME Agency: [facility] ([facility]) Date DME Agency Contacted: 09/07/24 Time DME Agency Contacted: [phone] Representative spoke with at DME Agency: [facility] Arranged: PT, OT HH Agency: Other - See comment ([facility])        Social Drivers of Health (SDOH) Interventions SDOH Screenings   Food Insecurity: No Food Insecurity (09/06/2024)  Housing: Low Risk  (09/06/2024)  Transportation Needs: No Transportation Needs (09/06/2024)  Utilities: Not At Risk (09/06/2024)  Alcohol Screen: Low Risk  (05/16/2024)  Depression (PHQ2-9): Low Risk   (08/14/2024)  Financial Resource Strain: Low Risk  (08/06/2024)   Received from [facility]  Physical Activity: Insufficiently Active (05/16/2024)  Social Connections: Socially Integrated (09/06/2024)  Stress: No Stress Concern Present (05/16/2024)  Tobacco Use: Low Risk  (09/06/2024)   Received from [facility]  Health Literacy: Adequate Health Literacy (05/17/2024)     Readmission Risk Interventions     No data to display

## 2024-09-07 NOTE — Evaluation (Signed)
 Physical Therapy Evaluation Patient Details Name: Stacie Mcdonald MRN: [id number] DOB: Oct 24, 1946 Today's Date: 09/07/2024  History of Present Illness  Pt is a 78 y/o F admitted on 09/06/24 after presenting with c/o R knee pain; d/c from [facility] the day prior after being treated for possible stent thrombosis of recently placed proximal RCA stent & moderate severe ISR in the mid & distal RCA, s/p POBA with noncompliant baloon & DCB of the entire RCA. Diagnostic aspiration consistent with pseudogout. PMH: CAD with stent, HTN, HLD, DM, COPD/asthma, sCHF, CKD 3A, ILD, OSA on CPAP, breast CA in remission (s/p R mastectomy, chemo & radiation therapy)  Clinical Impression  Pt seen for PT evaluation with pt agreeable to tx. Pt reports she lives with spouse in home with 3 steps with B rails to enter, no DME at home. On this date, PT educated pt on importance of mobility & pt voiced understanding. Pt with limited R knee ROM 2/2 pain. Pt does attempt gait, is able to ambulate 4 ft forwards + 4 ft backwards with RW & CGA with impaired gait pattern as noted below. Educated pt on modifications, potential ability to borrow a w/c for longer community distances. Recommend ongoing PT services to progress gait with LRAD & stair negotiation.        If plan is discharge home, recommend the following: A little help with bathing/dressing/bathroom;Assistance with cooking/housework;A little help with walking and/or transfers;Assist for transportation;Help with stairs or ramp for entrance   Can travel by private vehicle        Equipment Recommendations Rolling walker (2 wheels);BSC/3in1  Recommendations for Other Services       Functional Status Assessment Patient has had a recent decline in their functional status and demonstrates the ability to make significant improvements in function in a reasonable and predictable amount of time.     Precautions / Restrictions Precautions Precautions: Fall Restrictions Weight  Bearing Restrictions Per Provider Order: No      Mobility  Bed Mobility               General bed mobility comments: not tested, pt received & left sitting in recliner    Transfers   Equipment used: Rolling walker (2 wheels) Transfers: Sit to/from Stand Sit to Stand: Contact guard assist           General transfer comment: education re: hand placement, technique to decrease pain during movement    Ambulation/Gait Ambulation/Gait assistance: Contact guard assist Gait Distance (Feet):  (4 ft forwards + 4 ft backwards) Assistive device: Rolling walker (2 wheels) Gait Pattern/deviations: Decreased step length - left, Decreased step length - right, Decreased dorsiflexion - right, Decreased stride length, Decreased stance time - right, Decreased weight shift to right Gait velocity: decreased     General Gait Details: Pt ambulated 4 ft forwards with RW with education re: use of RW to offload RLE. After 4 ft pt states she's ready to rest but able to take steps backwards to recliner.  Stairs            Wheelchair Mobility     Tilt Bed    Modified Rankin (Stroke Patients Only)       Balance Overall balance assessment: Needs assistance Sitting-balance support: Feet supported Sitting balance-Leahy Scale: Good     Standing balance support: During functional activity, Bilateral upper extremity supported, Reliant on assistive device for balance Standing balance-Leahy Scale: Fair  Pertinent Vitals/Pain Pain Assessment Pain Assessment: 0-10 Pain Score: 6  Pain Location: R knee at end of session after mobility Pain Descriptors / Indicators: Discomfort, Grimacing Pain Intervention(s): Monitored during session, Limited activity within patient's tolerance, Repositioned    Home Living Family/patient expects to be discharged to:: Private residence Living Arrangements: Spouse/significant other Available Help at Discharge:  Family Type of Home: House Home Access: Stairs to enter Entrance Stairs-Rails: Doctor, general practice of Steps: 3   Home Layout: One level Home Equipment: Shower seat (walking stick)      Prior Function Prior Level of Function : Independent/Modified Independent             Mobility Comments: recently d/c from [facility], minimal walking after surgery 2/2 increased R knee pain, reports PLOF is independent       Extremity/Trunk Assessment   Upper Extremity Assessment Upper Extremity Assessment: Overall WFL for tasks assessed    Lower Extremity Assessment Lower Extremity Assessment: RLE deficits/detail RLE Deficits / Details: 2/5 knee extension in sitting, limited by pain RLE: Unable to fully assess due to pain       Communication   Communication Communication: No apparent difficulties    Cognition Arousal: Alert Behavior During Therapy: WFL for tasks assessed/performed   PT - Cognitive impairments: No apparent impairments                         Following commands: Intact       Cueing Cueing Techniques: Verbal cues     General Comments General comments (skin integrity, edema, etc.): Educated pt on importance of mobility as tolerated.    Exercises     Assessment/Plan    PT Assessment Patient needs continued PT services  PT Problem List Pain;Decreased range of motion;Decreased activity tolerance;Decreased mobility;Decreased knowledge of use of DME;Decreased strength;Decreased balance       PT Treatment Interventions DME instruction;Balance training;Gait training;Neuromuscular re-education;Stair training;Patient/family education;Functional mobility training;Therapeutic activities;Therapeutic exercise;Manual techniques    PT Goals (Current goals can be found in the Care Plan section)  Acute Rehab PT Goals Patient Stated Goal: decreased pain PT Goal Formulation: With patient Time For Goal Achievement: 09/21/24 Potential to Achieve Goals:  Good    Frequency Min 2X/week     Co-evaluation               AM-PAC PT 6 Clicks Mobility  Outcome Measure Help needed turning from your back to your side while in a flat bed without using bedrails?: None Help needed moving from lying on your back to sitting on the side of a flat bed without using bedrails?: A Little Help needed moving to and from a bed to a chair (including a wheelchair)?: A Little Help needed standing up from a chair using your arms (e.g., wheelchair or bedside chair)?: A Little Help needed to walk in hospital room?: A Little Help needed climbing 3-5 steps with a railing? : Total 6 Click Score: 17    End of Session   Activity Tolerance: Patient limited by pain Patient left: in chair;with call bell/phone within reach Nurse Communication: Mobility status PT Visit Diagnosis: Pain;Difficulty in walking, not elsewhere classified (R26.2) Pain - Right/Left: Right Pain - part of body: Knee    Time: 9045-8994 PT Time Calculation (min) (ACUTE ONLY): 11 min   Charges:   PT Evaluation $PT Eval Moderate Complexity: 1 Mod   PT General Charges $$ ACUTE PT VISIT: 1 Visit  Richerd Pinal, PT, DPT 09/07/24, 10:39 AM   Richerd CHRISTELLA Pinal 09/07/2024, 10:38 AM

## 2024-09-07 NOTE — TOC CM/SW Note (Signed)
 Patient is not able to walk the distance required to go the bathroom, or he/she is unable to safely negotiate stairs required to access the bathroom.  A 3in1 BSC will alleviate this problem

## 2024-09-07 NOTE — Discharge Summary (Addendum)
 Physician Discharge Summary   Patient: Stacie Mcdonald MRN: [id number] DOB: [birth year]  Admit date:     09/06/2024  Discharge date: 09/07/24  Discharge Physician: AIDA CHO   PCP: Marylynn Verneita CROME, MD   Recommendations at discharge:   Follow-up with PCP in 1 week  Discharge Diagnoses: Principal Problem:   Pseudogout of right knee Active Problems:   CAD (coronary artery disease)   Chronic systolic CHF (congestive heart failure) (HCC)   Interstitial pulmonary disease (HCC)   COPD (chronic obstructive pulmonary disease) (HCC)   Essential hypertension   Hyperlipidemia associated with type 2 diabetes mellitus (HCC)   Type II diabetes mellitus with renal manifestations (HCC)   Chronic kidney disease, stage 3a (HCC)   Overweight (BMI 25.0-29.9)  Resolved Problems:   * No resolved hospital problems. *  Hospital Course:  Stacie Mcdonald is a 78 y.o. female with medical history significant of CAD with stent, HTN, HLD, DM, COPD/asthma, sCHF with EF  4-50%, CKD-3a, ILD, OSA on CPAP, breast cancer in remission (s/p of right mastectomy, chemotherapy and radiation therapy), who presents with right knee pain.   Patient was recently hospitalized at [facility] and was discharged from [facility] on 09/07/2024.  She went to an Ortho clinic because of painful swelling of the right knee.  She was referred from the clinic to the ED for further evaluation.  Her right knee joint was aspirated in the ED and synovial fluid was positive for calcium  pyrophosphate crystals.  She was admitted to the hospital for acute pseudogout of right knee. .  Assessment and Plan:   Acute pseudogout of right knee: S/p aspiration of right knee joint on 09/06/2024.  Synovial fluid positive for calcium  pyrophosphate crystals consistent with pseudogout.  She was started on prednisone  (received 2 doses).  Pain and swelling of right knee have improved.  She will be discharged on prednisone  for 2 more  days.  Tylenol  as needed for pain. She was evaluated by PT and OT who recommended home health therapy and a rolling walker.  Bedside commode was recommended as well but patient declined.   CAD (coronary artery disease): s/p coronary stent.  Continue low-dose aspirin , prasugrel, Ranexa    Chronic systolic CHF (congestive heart failure) (HCC): 2D echo on 02/27/2024 showed EF of 40-50%.  Patient has trace leg edema, no SOB, CHF seem to be compensated. -Continue home Lasix  and spironolactone  - Continue Entresto     Interstitial pulmonary disease and COPD: Stable -Bronchodilators and as needed Mucinex     Essential hypertension Continue Lasix , spironolactone , Imdur , metoprolol , Entresto     Hyperlipidemia associated with type 2 diabetes mellitus (HCC) She is on Praluent injection    Type II diabetes mellitus with renal manifestations (HCC) recent A1c 6.5, continue Jardiance     Chronic kidney disease, stage 3a (HCC): Stable, GFR> 60    Her condition has improved and she is deemed stable for discharge to home today.          Consultants: None Procedures performed: Aspiration of right knee joint Disposition: Home health Diet recommendation:  Discharge Diet Orders (From admission, onward)     Start     Ordered   09/07/24 0000  Diet - low sodium heart healthy        09/07/24 1317           Cardiac and Carb modified diet DISCHARGE MEDICATION: Allergies as of 09/07/2024       Reactions   Penicillins Hives   Has patient had a  PCN reaction causing immediate rash, facial/tongue/throat swelling, SOB or lightheadedness with hypotension: No Has patient had a PCN reaction causing severe rash involving mucus membranes or skin necrosis: Yes Has patient had a PCN reaction that required hospitalization: No Has patient had a PCN reaction occurring within the last 10 years: Yes If all of the above answers are NO, then may proceed with Cephalosporin use.   Statins    Other  reaction(s): Muscle Pain        Medication List     TAKE these medications    albuterol  108 (90 Base) MCG/ACT inhaler Commonly known as: VENTOLIN  HFA Inhale 2 puffs into the lungs every 6 (six) hours as needed. Using in Am daily  per MD   aspirin  81 MG tablet Take 81 mg by mouth daily.   Contour Next Test test strip Generic drug: glucose blood Use to check blood sugar twice daily.   D-MANNOSE PO Take 800 mg by mouth daily.   ELDERBERRY PO Take 1,000 mg by mouth daily.   freestyle lancets To check blood sugars once daily   furosemide  40 MG tablet Commonly known as: LASIX  Take 40 mg by mouth daily.   GLUCOSAMINE-CHONDROITIN PO Take 2 tablets by mouth daily.   isosorbide  mononitrate 30 MG 24 hr tablet Commonly known as: IMDUR  Take 30 mg by mouth daily.   Jardiance  25 MG Tabs tablet Generic drug: empagliflozin  TAKE 1 TABLET BY MOUTH DAILY BEFORE BREAKFAST   loratadine  10 MG tablet Commonly known as: CLARITIN  Take 10 mg by mouth daily as needed for allergies.   metoprolol  200 MG 24 hr tablet Commonly known as: TOPROL -XL TAKE 1 TABLET BY MOUTH DAILY.   nitroGLYCERIN  0.4 MG SL tablet Commonly known as: NITROSTAT  Place 0.4 mg under the tongue every 5 (five) minutes as needed for chest pain.   pantoprazole  40 MG tablet Commonly known as: PROTONIX  Take 1 tablet (40 mg total) by mouth daily.   Praluent 75 MG/ML Soaj Generic drug: Alirocumab Inject 75 mg into the muscle every 14 (fourteen) days.   prasugrel 5 MG Tabs tablet Commonly known as: EFFIENT Take 5 mg by mouth daily.   predniSONE  10 MG tablet Commonly known as: DELTASONE  Take 2 tablets (20 mg total) by mouth daily with breakfast for 2 days. Start taking on: September 08, 2024   ranolazine  1000 MG SR tablet Commonly known as: RANEXA  Take 1,000 mg by mouth 2 (two) times daily.   sacubitril -valsartan  49-51 MG Commonly known as: ENTRESTO  Take 1 tablet by mouth 2 (two) times daily.    spironolactone  25 MG tablet Commonly known as: Aldactone  Take 1 tablet (25 mg total) by mouth daily.   traZODone  150 MG tablet Commonly known as: DESYREL  Take 1 tablet (150 mg total) by mouth at bedtime.               Durable Medical Equipment  (From admission, onward)           Start     Ordered   09/07/24 0000  For home use only DME Walker rolling       Question Answer Comment  Walker: With 5 Inch Wheels   Patient needs a walker to treat with the following condition Pseudogout of right knee      09/07/24 1317            Discharge Exam: Filed Weights   09/06/24 1441  Weight: 77.8 kg   GEN: NAD SKIN: Warm and dry EYES: No pallor or icterus ENT:  MMM CV: RRR PULM: CTA B ABD: soft, obese, NT, +BS CNS: AAO x 3, non focal EXT: Decreased tenderness and swelling of right knee   Condition at discharge: good  The results of significant diagnostics from this hospitalization (including imaging, microbiology, ancillary and laboratory) are listed below for reference.   Imaging Studies: US  Venous Img Lower Unilateral Right Result Date: 09/06/2024 CLINICAL DATA:  R leg swelling, r/o DVT EXAM: RIGHT LOWER EXTREMITY VENOUS DOPPLER ULTRASOUND TECHNIQUE: Gray-scale sonography with graded compression, as well as color Doppler and duplex ultrasound were performed to evaluate the lower extremity deep venous systems from the level of the common femoral vein and including the common femoral, femoral, profunda femoral, popliteal and calf veins including the posterior tibial, peroneal and gastrocnemius veins when visible. The superficial great saphenous vein was also interrogated. Spectral Doppler was utilized to evaluate flow at rest and with distal augmentation maneuvers in the common femoral, femoral and popliteal veins. COMPARISON:  February 27, 2021 FINDINGS: Contralateral Common Femoral Vein: Respiratory phasicity is normal and symmetric with the symptomatic side. No evidence  of thrombus. Normal compressibility. Common Femoral Vein: No evidence of thrombus. Normal compressibility, respiratory phasicity and response to augmentation. Saphenofemoral Junction: No evidence of thrombus. Normal compressibility and flow on color Doppler imaging. Profunda Femoral Vein: No evidence of thrombus. Normal compressibility and flow on color Doppler imaging. Femoral Vein: No evidence of thrombus. Normal compressibility, respiratory phasicity and response to augmentation. Popliteal Vein: No evidence of thrombus. Normal compressibility, respiratory phasicity and response to augmentation. Calf Veins: No evidence of thrombus. Normal compressibility and flow on color Doppler imaging. Superficial Great Saphenous Vein: No evidence of thrombus. Normal compressibility. Other Findings: Fluid collection in the right popliteal fossa measuring 3.2 x 1.2 x 3.7 cm, possibly a Baker's cyst. IMPRESSION: Negative for deep venous thrombosis in the right leg. Electronically Signed   By: Rogelia Myers M.D.   On: 09/06/2024 18:42   DG Knee Complete 4 Views Right Result Date: 09/06/2024 CLINICAL DATA:  Right knee pain unable to bear weight concern for infection EXAM: RIGHT KNEE - COMPLETE 4+ VIEW COMPARISON:  None Available. FINDINGS: No fracture or malalignment. Tricompartment arthritis of the knee, worst involving the patellofemoral joint space. No definitive effusion. Mild joint space calcification consistent with chondrocalcinosis. IMPRESSION: No acute osseous abnormality. Tricompartment arthritis of the knee, worst involving the patellofemoral joint space. Chondrocalcinosis. Cross-sectional imaging follow-up as appropriate given clinical history Electronically Signed   By: Luke Bun M.D.   On: 09/06/2024 15:25    Microbiology: Results for orders placed or performed in visit on 09/21/23  Urine Culture     Status: None   Collection Time: 09/21/23  1:43 PM   Specimen: Urine  Result Value Ref Range Status    MICRO NUMBER: [id number]  Final   SPECIMEN QUALITY: Adequate  Final   Sample Source URINE  Final   STATUS: FINAL  Final   Result:   Final    Mixed genital flora isolated. These superficial bacteria are not indicative of a urinary tract infection. No further organism identification is warranted on this specimen. If clinically indicated, recollect clean-catch, mid-stream urine and transfer  immediately to Urine Culture Transport Tube.     Labs: CBC: Recent Labs  Lab 09/06/24 1445 09/07/24 0354  WBC 10.5 9.0  NEUTROABS 8.3*  --   HGB 14.5 12.8  HCT 44.7 39.5  MCV 94.9 94.5  PLT 267 235   Basic Metabolic Panel: Recent Labs  Lab 09/06/24 1445  NA 141  K 4.2  CL 101  CO2 24  GLUCOSE 111*  BUN 16  CREATININE 0.80  CALCIUM  10.3   Liver Function Tests: Recent Labs  Lab 09/06/24 1445  AST 25  ALT 17  ALKPHOS 54  BILITOT 0.8  PROT 9.1*  ALBUMIN 4.8   CBG: Recent Labs  Lab 09/07/24 0811 09/07/24 1126  GLUCAP 136* 153*    Discharge time spent: greater than 30 minutes.  Signed: AIDA CHO, MD [facility] 09/07/2024

## 2024-09-07 NOTE — Evaluation (Signed)
 Occupational Therapy Evaluation Patient Details Name: Stacie Mcdonald MRN: [id number] DOB: 1946-10-26 Today's Date: 09/07/2024   History of Present Illness   Pt is a 78 year old female presents with right knee pain, admitted with psedugout    PMH significant for CAD with stent, HTN, HLD, DM, COPD/asthma, sCHF with EF  4-50%, CKD-3a, ILD, OSA on CPAP, breast cancer in remission (s/p of right mastectomy, chemotherapy and radiation therapy). Of note, Patient was recently hospitalized to [facility], discharged 10/21 due to possible stent thrombosis of recently placed proximal RCA stent and moderate severe ISR in the mid and distal RCA, pt is s/p POBA with noncompliant balloon and DCB of the entire RCA.     Clinical Impressions Chart reviewed to date, pt greeted semi supine in bed, agreeable to OT evaluation. PTA pt is indep in ADL/IADL,amb with no AD. She presents with deficits in activity tolerance,endurance, balance affecting safe and optimal ADL completion. Bed mobility completed with supervision, STS, amb approx 10' with RW with CGA with verbal cues for technique. SET UP required for grooming/feeding tasks. Pt is performing ADL/functional mobility below PLOF, will benefit from acute OT to address deficits and to facilitate optimal ADL performance. Provided education re: use of AE/DME for improved ADL performance. Pt is left in chair, all needs met. OT will follow.      If plan is discharge home, recommend the following:   A little help with walking and/or transfers;A little help with bathing/dressing/bathroom     Functional Status Assessment   Patient has had a recent decline in their functional status and demonstrates the ability to make significant improvements in function in a reasonable and predictable amount of time.     Equipment Recommendations   BSC/3in1;Other (comment) (2WW)     Recommendations for Other Services         Precautions/Restrictions    Precautions Precautions: Fall Recall of Precautions/Restrictions: Intact Restrictions Weight Bearing Restrictions Per Provider Order: No     Mobility Bed Mobility Overal bed mobility: Needs Assistance Bed Mobility: Supine to Sit     Supine to sit: Supervision     General bed mobility comments: increased time, intermittent vcs for technique    Transfers Overall transfer level: Needs assistance Equipment used: Rolling walker (2 wheels) Transfers: Sit to/from Stand Sit to Stand: Contact guard assist           General transfer comment: multiple attempts from regular bed height and from chair, intermittent vcs for technique with good carry over      Balance Overall balance assessment: Needs assistance Sitting-balance support: Feet supported Sitting balance-Leahy Scale: Good     Standing balance support: Bilateral upper extremity supported, During functional activity, Reliant on assistive device for balance Standing balance-Leahy Scale: Fair                             ADL either performed or assessed with clinical judgement   ADL Overall ADL's : Needs assistance/impaired Eating/Feeding: Set up;Sitting   Grooming: Set up;Sitting           Upper Body Dressing : Supervision/safety;Sitting   Lower Body Dressing: Maximal assistance Lower Body Dressing Details (indicate cue type and reason): simulated Toilet Transfer: Contact guard assist;Rolling walker (2 wheels);Ambulation Toilet Transfer Details (indicate cue type and reason): simulated to bedside chair         Functional mobility during ADLs: Contact guard assist;Rolling walker (2 wheels) (approx 10' in room, frequent  vcs for technique with RW)       Vision Patient Visual Report: No change from baseline       Perception         Praxis         Pertinent Vitals/Pain Pain Assessment Pain Assessment: Faces Faces Pain Scale: Hurts whole lot Pain Location: R knee with mobility Pain  Descriptors / Indicators: Discomfort, Grimacing Pain Intervention(s): Repositioned, Monitored during session, Limited activity within patient's tolerance     Extremity/Trunk Assessment Upper Extremity Assessment Upper Extremity Assessment: Overall WFL for tasks assessed   Lower Extremity Assessment Lower Extremity Assessment: Defer to PT evaluation;RLE deficits/detail RLE Deficits / Details: edema throughout R knee       Communication Communication Communication: No apparent difficulties   Cognition Arousal: Alert Behavior During Therapy: WFL for tasks assessed/performed Cognition: No apparent impairments                               Following commands: Intact       Cueing  General Comments   Cueing Techniques: Verbal cues  HR 109 bpm pre session, 111 bpm post session   Exercises Other Exercises Other Exercises: edu re role of OT, role of rehab, discharge recommendations, safer ADLs with AE/DME   Shoulder Instructions      Home Living Family/patient expects to be discharged to:: Private residence Living Arrangements: Spouse/significant other Available Help at Discharge: Family Type of Home: House Home Access: Stairs to enter Secretary/administrator of Steps: 3 Entrance Stairs-Rails: Right;Left Home Layout: One level     Bathroom Shower/Tub: Tub/shower unit;Walk-in shower   Bathroom Toilet: Handicapped height     Home Equipment: Shower seat          Prior Functioning/Environment Prior Level of Function : Independent/Modified Independent                    OT Problem List: Decreased strength;Decreased cognition;Decreased activity tolerance;Impaired balance (sitting and/or standing);Decreased coordination;Decreased knowledge of use of DME or AE   OT Treatment/Interventions: Self-care/ADL training;Therapeutic exercise;Therapeutic activities;Energy conservation;Patient/family education;DME and/or AE instruction      OT Goals(Current  goals can be found in the care plan section)   Acute Rehab OT Goals Patient Stated Goal: improve knee function OT Goal Formulation: With patient Time For Goal Achievement: 09/21/24 Potential to Achieve Goals: Good ADL Goals Pt Will Perform Grooming: with modified independence;sitting;standing Pt Will Perform Lower Body Dressing: with modified independence;sit to/from stand;sitting/lateral leans Pt Will Transfer to Toilet: with modified independence;ambulating Pt Will Perform Toileting - Clothing Manipulation and hygiene: with modified independence;sitting/lateral leans;sit to/from stand   OT Frequency:  Min 2X/week    Co-evaluation              AM-PAC OT 6 Clicks Daily Activity     Outcome Measure Help from another person eating meals?: None Help from another person taking care of personal grooming?: None Help from another person toileting, which includes using toliet, bedpan, or urinal?: A Little Help from another person bathing (including washing, rinsing, drying)?: A Little Help from another person to put on and taking off regular upper body clothing?: None Help from another person to put on and taking off regular lower body clothing?: A Lot 6 Click Score: 20   End of Session Equipment Utilized During Treatment: Rolling walker (2 wheels) Nurse Communication: Mobility status  Activity Tolerance: Patient tolerated treatment well Patient left: in chair;with call bell/phone within  reach  OT Visit Diagnosis: Muscle weakness (generalized) (M62.81);Other abnormalities of gait and mobility (R26.89)                Time: 9087-9059 OT Time Calculation (min): 28 min Charges:  OT General Charges $OT Visit: 1 Visit OT Evaluation $OT Eval Moderate Complexity: 1 Mod Therisa Sheffield, OTD OTR/L  09/07/24, 9:58 AM

## 2024-09-07 NOTE — Plan of Care (Signed)

## 2024-09-07 NOTE — Plan of Care (Signed)

## 2024-09-08 DIAGNOSIS — M1120 Other chondrocalcinosis, unspecified site: Secondary | ICD-10-CM | POA: Diagnosis not present

## 2024-09-08 DIAGNOSIS — E1129 Type 2 diabetes mellitus with other diabetic kidney complication: Secondary | ICD-10-CM | POA: Diagnosis not present

## 2024-09-09 DIAGNOSIS — M11261 Other chondrocalcinosis, right knee: Secondary | ICD-10-CM | POA: Diagnosis not present

## 2024-09-09 DIAGNOSIS — I5022 Chronic systolic (congestive) heart failure: Secondary | ICD-10-CM | POA: Diagnosis not present

## 2024-09-09 DIAGNOSIS — M1990 Unspecified osteoarthritis, unspecified site: Secondary | ICD-10-CM | POA: Diagnosis not present

## 2024-09-09 DIAGNOSIS — J4489 Other specified chronic obstructive pulmonary disease: Secondary | ICD-10-CM | POA: Diagnosis not present

## 2024-09-09 DIAGNOSIS — Z7982 Long term (current) use of aspirin: Secondary | ICD-10-CM | POA: Diagnosis not present

## 2024-09-09 DIAGNOSIS — E663 Overweight: Secondary | ICD-10-CM | POA: Diagnosis not present

## 2024-09-09 DIAGNOSIS — Z853 Personal history of malignant neoplasm of breast: Secondary | ICD-10-CM | POA: Diagnosis not present

## 2024-09-09 DIAGNOSIS — I2510 Atherosclerotic heart disease of native coronary artery without angina pectoris: Secondary | ICD-10-CM | POA: Diagnosis not present

## 2024-09-09 DIAGNOSIS — Z955 Presence of coronary angioplasty implant and graft: Secondary | ICD-10-CM | POA: Diagnosis not present

## 2024-09-09 DIAGNOSIS — I130 Hypertensive heart and chronic kidney disease with heart failure and stage 1 through stage 4 chronic kidney disease, or unspecified chronic kidney disease: Secondary | ICD-10-CM | POA: Diagnosis not present

## 2024-09-09 DIAGNOSIS — I252 Old myocardial infarction: Secondary | ICD-10-CM | POA: Diagnosis not present

## 2024-09-09 DIAGNOSIS — E1169 Type 2 diabetes mellitus with other specified complication: Secondary | ICD-10-CM | POA: Diagnosis not present

## 2024-09-09 DIAGNOSIS — K219 Gastro-esophageal reflux disease without esophagitis: Secondary | ICD-10-CM | POA: Diagnosis not present

## 2024-09-09 DIAGNOSIS — J849 Interstitial pulmonary disease, unspecified: Secondary | ICD-10-CM | POA: Diagnosis not present

## 2024-09-09 DIAGNOSIS — E785 Hyperlipidemia, unspecified: Secondary | ICD-10-CM | POA: Diagnosis not present

## 2024-09-09 DIAGNOSIS — E1122 Type 2 diabetes mellitus with diabetic chronic kidney disease: Secondary | ICD-10-CM | POA: Diagnosis not present

## 2024-09-09 DIAGNOSIS — Z7902 Long term (current) use of antithrombotics/antiplatelets: Secondary | ICD-10-CM | POA: Diagnosis not present

## 2024-09-09 DIAGNOSIS — E1129 Type 2 diabetes mellitus with other diabetic kidney complication: Secondary | ICD-10-CM | POA: Diagnosis not present

## 2024-09-09 DIAGNOSIS — G4733 Obstructive sleep apnea (adult) (pediatric): Secondary | ICD-10-CM | POA: Diagnosis not present

## 2024-09-09 DIAGNOSIS — Z860100 Personal history of colon polyps, unspecified: Secondary | ICD-10-CM | POA: Diagnosis not present

## 2024-09-09 DIAGNOSIS — N1831 Chronic kidney disease, stage 3a: Secondary | ICD-10-CM | POA: Diagnosis not present

## 2024-09-11 ENCOUNTER — Telehealth: Payer: Self-pay

## 2024-09-11 NOTE — Telephone Encounter (Signed)
 Copied from CRM [id number]. Topic: Clinical - Home Health Verbal Orders >> Sep 11, 2024  9:20 AM Ahlexyia S wrote: Caller/Agency: Grace with [facility] Callback Number: [phone] Service Requested: Physical Therapy Frequency: 1x a week for 1 week; 2x a week for 4 weeks; 1x a week for 4 weeks Any new concerns about the patient? No

## 2024-09-11 NOTE — Telephone Encounter (Signed)
 LMTCB

## 2024-09-12 ENCOUNTER — Telehealth: Payer: Self-pay

## 2024-09-12 NOTE — Telephone Encounter (Signed)
 duplicate

## 2024-09-12 NOTE — Telephone Encounter (Signed)
 Duplicate message

## 2024-09-12 NOTE — Telephone Encounter (Signed)
 Copied from CRM [id number]. Topic: Clinical - Home Health Verbal Orders >> Sep 11, 2024  9:20 AM Ahlexyia S wrote: Caller/Agency: Grace with [facility] Callback Number: [phone] Service Requested: Physical Therapy Frequency: 1x a week for 1 week; 2x a week for 4 weeks; 1x a week for 4 weeks Any new concerns about the patient? No >> Sep 12, 2024 10:31 AM Terri MATSU wrote: Gracie from PT is returning Eloy call back for a verbal order.

## 2024-09-12 NOTE — Telephone Encounter (Signed)
 Verbal orders given

## 2024-09-19 DIAGNOSIS — I5022 Chronic systolic (congestive) heart failure: Secondary | ICD-10-CM | POA: Diagnosis not present

## 2024-09-19 DIAGNOSIS — J4489 Other specified chronic obstructive pulmonary disease: Secondary | ICD-10-CM | POA: Diagnosis not present

## 2024-09-19 DIAGNOSIS — E1122 Type 2 diabetes mellitus with diabetic chronic kidney disease: Secondary | ICD-10-CM | POA: Diagnosis not present

## 2024-09-19 DIAGNOSIS — I252 Old myocardial infarction: Secondary | ICD-10-CM | POA: Diagnosis not present

## 2024-09-19 DIAGNOSIS — N1831 Chronic kidney disease, stage 3a: Secondary | ICD-10-CM | POA: Diagnosis not present

## 2024-09-19 DIAGNOSIS — I2510 Atherosclerotic heart disease of native coronary artery without angina pectoris: Secondary | ICD-10-CM | POA: Diagnosis not present

## 2024-09-19 DIAGNOSIS — M11261 Other chondrocalcinosis, right knee: Secondary | ICD-10-CM | POA: Diagnosis not present

## 2024-09-19 DIAGNOSIS — J849 Interstitial pulmonary disease, unspecified: Secondary | ICD-10-CM | POA: Diagnosis not present

## 2024-09-19 DIAGNOSIS — Z955 Presence of coronary angioplasty implant and graft: Secondary | ICD-10-CM | POA: Diagnosis not present

## 2024-09-19 DIAGNOSIS — Z860100 Personal history of colon polyps, unspecified: Secondary | ICD-10-CM | POA: Diagnosis not present

## 2024-09-19 DIAGNOSIS — E663 Overweight: Secondary | ICD-10-CM | POA: Diagnosis not present

## 2024-09-19 DIAGNOSIS — E1169 Type 2 diabetes mellitus with other specified complication: Secondary | ICD-10-CM | POA: Diagnosis not present

## 2024-09-19 DIAGNOSIS — M1990 Unspecified osteoarthritis, unspecified site: Secondary | ICD-10-CM | POA: Diagnosis not present

## 2024-09-19 DIAGNOSIS — Z7902 Long term (current) use of antithrombotics/antiplatelets: Secondary | ICD-10-CM | POA: Diagnosis not present

## 2024-09-19 DIAGNOSIS — G4733 Obstructive sleep apnea (adult) (pediatric): Secondary | ICD-10-CM | POA: Diagnosis not present

## 2024-09-19 DIAGNOSIS — Z7982 Long term (current) use of aspirin: Secondary | ICD-10-CM | POA: Diagnosis not present

## 2024-09-19 DIAGNOSIS — K219 Gastro-esophageal reflux disease without esophagitis: Secondary | ICD-10-CM | POA: Diagnosis not present

## 2024-09-19 DIAGNOSIS — I130 Hypertensive heart and chronic kidney disease with heart failure and stage 1 through stage 4 chronic kidney disease, or unspecified chronic kidney disease: Secondary | ICD-10-CM | POA: Diagnosis not present

## 2024-09-19 DIAGNOSIS — Z853 Personal history of malignant neoplasm of breast: Secondary | ICD-10-CM | POA: Diagnosis not present

## 2024-09-19 DIAGNOSIS — E1129 Type 2 diabetes mellitus with other diabetic kidney complication: Secondary | ICD-10-CM | POA: Diagnosis not present

## 2024-09-19 DIAGNOSIS — E785 Hyperlipidemia, unspecified: Secondary | ICD-10-CM | POA: Diagnosis not present

## 2024-09-21 DIAGNOSIS — E663 Overweight: Secondary | ICD-10-CM | POA: Diagnosis not present

## 2024-09-21 DIAGNOSIS — I5022 Chronic systolic (congestive) heart failure: Secondary | ICD-10-CM | POA: Diagnosis not present

## 2024-09-21 DIAGNOSIS — J849 Interstitial pulmonary disease, unspecified: Secondary | ICD-10-CM | POA: Diagnosis not present

## 2024-09-21 DIAGNOSIS — Z7982 Long term (current) use of aspirin: Secondary | ICD-10-CM | POA: Diagnosis not present

## 2024-09-21 DIAGNOSIS — Z853 Personal history of malignant neoplasm of breast: Secondary | ICD-10-CM | POA: Diagnosis not present

## 2024-09-21 DIAGNOSIS — Z955 Presence of coronary angioplasty implant and graft: Secondary | ICD-10-CM | POA: Diagnosis not present

## 2024-09-21 DIAGNOSIS — Z860100 Personal history of colon polyps, unspecified: Secondary | ICD-10-CM | POA: Diagnosis not present

## 2024-09-21 DIAGNOSIS — G4733 Obstructive sleep apnea (adult) (pediatric): Secondary | ICD-10-CM | POA: Diagnosis not present

## 2024-09-21 DIAGNOSIS — I130 Hypertensive heart and chronic kidney disease with heart failure and stage 1 through stage 4 chronic kidney disease, or unspecified chronic kidney disease: Secondary | ICD-10-CM | POA: Diagnosis not present

## 2024-09-21 DIAGNOSIS — I252 Old myocardial infarction: Secondary | ICD-10-CM | POA: Diagnosis not present

## 2024-09-21 DIAGNOSIS — M11261 Other chondrocalcinosis, right knee: Secondary | ICD-10-CM | POA: Diagnosis not present

## 2024-09-21 DIAGNOSIS — E1122 Type 2 diabetes mellitus with diabetic chronic kidney disease: Secondary | ICD-10-CM | POA: Diagnosis not present

## 2024-09-21 DIAGNOSIS — M1990 Unspecified osteoarthritis, unspecified site: Secondary | ICD-10-CM | POA: Diagnosis not present

## 2024-09-21 DIAGNOSIS — J4489 Other specified chronic obstructive pulmonary disease: Secondary | ICD-10-CM | POA: Diagnosis not present

## 2024-09-21 DIAGNOSIS — E1169 Type 2 diabetes mellitus with other specified complication: Secondary | ICD-10-CM | POA: Diagnosis not present

## 2024-09-21 DIAGNOSIS — Z7902 Long term (current) use of antithrombotics/antiplatelets: Secondary | ICD-10-CM | POA: Diagnosis not present

## 2024-09-21 DIAGNOSIS — K219 Gastro-esophageal reflux disease without esophagitis: Secondary | ICD-10-CM | POA: Diagnosis not present

## 2024-09-21 DIAGNOSIS — E785 Hyperlipidemia, unspecified: Secondary | ICD-10-CM | POA: Diagnosis not present

## 2024-09-21 DIAGNOSIS — I2510 Atherosclerotic heart disease of native coronary artery without angina pectoris: Secondary | ICD-10-CM | POA: Diagnosis not present

## 2024-09-21 DIAGNOSIS — E1129 Type 2 diabetes mellitus with other diabetic kidney complication: Secondary | ICD-10-CM | POA: Diagnosis not present

## 2024-09-21 DIAGNOSIS — N1831 Chronic kidney disease, stage 3a: Secondary | ICD-10-CM | POA: Diagnosis not present

## 2024-09-26 DIAGNOSIS — E663 Overweight: Secondary | ICD-10-CM | POA: Diagnosis not present

## 2024-09-26 DIAGNOSIS — I252 Old myocardial infarction: Secondary | ICD-10-CM | POA: Diagnosis not present

## 2024-09-26 DIAGNOSIS — Z7902 Long term (current) use of antithrombotics/antiplatelets: Secondary | ICD-10-CM | POA: Diagnosis not present

## 2024-09-26 DIAGNOSIS — E1122 Type 2 diabetes mellitus with diabetic chronic kidney disease: Secondary | ICD-10-CM | POA: Diagnosis not present

## 2024-09-26 DIAGNOSIS — J849 Interstitial pulmonary disease, unspecified: Secondary | ICD-10-CM | POA: Diagnosis not present

## 2024-09-26 DIAGNOSIS — I2510 Atherosclerotic heart disease of native coronary artery without angina pectoris: Secondary | ICD-10-CM | POA: Diagnosis not present

## 2024-09-26 DIAGNOSIS — E1129 Type 2 diabetes mellitus with other diabetic kidney complication: Secondary | ICD-10-CM | POA: Diagnosis not present

## 2024-09-26 DIAGNOSIS — E785 Hyperlipidemia, unspecified: Secondary | ICD-10-CM | POA: Diagnosis not present

## 2024-09-26 DIAGNOSIS — K219 Gastro-esophageal reflux disease without esophagitis: Secondary | ICD-10-CM | POA: Diagnosis not present

## 2024-09-26 DIAGNOSIS — Z860100 Personal history of colon polyps, unspecified: Secondary | ICD-10-CM | POA: Diagnosis not present

## 2024-09-26 DIAGNOSIS — M1990 Unspecified osteoarthritis, unspecified site: Secondary | ICD-10-CM | POA: Diagnosis not present

## 2024-09-26 DIAGNOSIS — G4733 Obstructive sleep apnea (adult) (pediatric): Secondary | ICD-10-CM | POA: Diagnosis not present

## 2024-09-26 DIAGNOSIS — N1831 Chronic kidney disease, stage 3a: Secondary | ICD-10-CM | POA: Diagnosis not present

## 2024-09-26 DIAGNOSIS — E1169 Type 2 diabetes mellitus with other specified complication: Secondary | ICD-10-CM | POA: Diagnosis not present

## 2024-09-26 DIAGNOSIS — J4489 Other specified chronic obstructive pulmonary disease: Secondary | ICD-10-CM | POA: Diagnosis not present

## 2024-09-26 DIAGNOSIS — M11261 Other chondrocalcinosis, right knee: Secondary | ICD-10-CM | POA: Diagnosis not present

## 2024-09-26 DIAGNOSIS — Z955 Presence of coronary angioplasty implant and graft: Secondary | ICD-10-CM | POA: Diagnosis not present

## 2024-09-26 DIAGNOSIS — I130 Hypertensive heart and chronic kidney disease with heart failure and stage 1 through stage 4 chronic kidney disease, or unspecified chronic kidney disease: Secondary | ICD-10-CM | POA: Diagnosis not present

## 2024-09-26 DIAGNOSIS — I5022 Chronic systolic (congestive) heart failure: Secondary | ICD-10-CM | POA: Diagnosis not present

## 2024-09-26 DIAGNOSIS — Z7982 Long term (current) use of aspirin: Secondary | ICD-10-CM | POA: Diagnosis not present

## 2024-09-26 DIAGNOSIS — Z853 Personal history of malignant neoplasm of breast: Secondary | ICD-10-CM | POA: Diagnosis not present

## 2024-09-29 DIAGNOSIS — I2510 Atherosclerotic heart disease of native coronary artery without angina pectoris: Secondary | ICD-10-CM | POA: Diagnosis not present

## 2024-09-29 DIAGNOSIS — E663 Overweight: Secondary | ICD-10-CM | POA: Diagnosis not present

## 2024-09-29 DIAGNOSIS — Z853 Personal history of malignant neoplasm of breast: Secondary | ICD-10-CM | POA: Diagnosis not present

## 2024-09-29 DIAGNOSIS — M11261 Other chondrocalcinosis, right knee: Secondary | ICD-10-CM | POA: Diagnosis not present

## 2024-09-29 DIAGNOSIS — E1169 Type 2 diabetes mellitus with other specified complication: Secondary | ICD-10-CM | POA: Diagnosis not present

## 2024-09-29 DIAGNOSIS — N1831 Chronic kidney disease, stage 3a: Secondary | ICD-10-CM | POA: Diagnosis not present

## 2024-09-29 DIAGNOSIS — Z860100 Personal history of colon polyps, unspecified: Secondary | ICD-10-CM | POA: Diagnosis not present

## 2024-09-29 DIAGNOSIS — Z7982 Long term (current) use of aspirin: Secondary | ICD-10-CM | POA: Diagnosis not present

## 2024-09-29 DIAGNOSIS — Z7902 Long term (current) use of antithrombotics/antiplatelets: Secondary | ICD-10-CM | POA: Diagnosis not present

## 2024-09-29 DIAGNOSIS — J849 Interstitial pulmonary disease, unspecified: Secondary | ICD-10-CM | POA: Diagnosis not present

## 2024-09-29 DIAGNOSIS — M1990 Unspecified osteoarthritis, unspecified site: Secondary | ICD-10-CM | POA: Diagnosis not present

## 2024-09-29 DIAGNOSIS — I5022 Chronic systolic (congestive) heart failure: Secondary | ICD-10-CM | POA: Diagnosis not present

## 2024-09-29 DIAGNOSIS — J4489 Other specified chronic obstructive pulmonary disease: Secondary | ICD-10-CM | POA: Diagnosis not present

## 2024-09-29 DIAGNOSIS — E1122 Type 2 diabetes mellitus with diabetic chronic kidney disease: Secondary | ICD-10-CM | POA: Diagnosis not present

## 2024-09-29 DIAGNOSIS — K219 Gastro-esophageal reflux disease without esophagitis: Secondary | ICD-10-CM | POA: Diagnosis not present

## 2024-09-29 DIAGNOSIS — Z955 Presence of coronary angioplasty implant and graft: Secondary | ICD-10-CM | POA: Diagnosis not present

## 2024-09-29 DIAGNOSIS — E785 Hyperlipidemia, unspecified: Secondary | ICD-10-CM | POA: Diagnosis not present

## 2024-09-29 DIAGNOSIS — E1129 Type 2 diabetes mellitus with other diabetic kidney complication: Secondary | ICD-10-CM | POA: Diagnosis not present

## 2024-09-29 DIAGNOSIS — I130 Hypertensive heart and chronic kidney disease with heart failure and stage 1 through stage 4 chronic kidney disease, or unspecified chronic kidney disease: Secondary | ICD-10-CM | POA: Diagnosis not present

## 2024-09-29 DIAGNOSIS — G4733 Obstructive sleep apnea (adult) (pediatric): Secondary | ICD-10-CM | POA: Diagnosis not present

## 2024-09-29 DIAGNOSIS — I252 Old myocardial infarction: Secondary | ICD-10-CM | POA: Diagnosis not present

## 2024-10-02 DIAGNOSIS — M11261 Other chondrocalcinosis, right knee: Secondary | ICD-10-CM | POA: Diagnosis not present

## 2024-10-02 DIAGNOSIS — E1122 Type 2 diabetes mellitus with diabetic chronic kidney disease: Secondary | ICD-10-CM | POA: Diagnosis not present

## 2024-10-02 DIAGNOSIS — I130 Hypertensive heart and chronic kidney disease with heart failure and stage 1 through stage 4 chronic kidney disease, or unspecified chronic kidney disease: Secondary | ICD-10-CM | POA: Diagnosis not present

## 2024-10-02 DIAGNOSIS — I5022 Chronic systolic (congestive) heart failure: Secondary | ICD-10-CM | POA: Diagnosis not present

## 2024-10-04 DIAGNOSIS — E785 Hyperlipidemia, unspecified: Secondary | ICD-10-CM | POA: Diagnosis not present

## 2024-10-04 DIAGNOSIS — Z860100 Personal history of colon polyps, unspecified: Secondary | ICD-10-CM | POA: Diagnosis not present

## 2024-10-04 DIAGNOSIS — K219 Gastro-esophageal reflux disease without esophagitis: Secondary | ICD-10-CM | POA: Diagnosis not present

## 2024-10-04 DIAGNOSIS — E1169 Type 2 diabetes mellitus with other specified complication: Secondary | ICD-10-CM | POA: Diagnosis not present

## 2024-10-04 DIAGNOSIS — N1831 Chronic kidney disease, stage 3a: Secondary | ICD-10-CM | POA: Diagnosis not present

## 2024-10-04 DIAGNOSIS — E1122 Type 2 diabetes mellitus with diabetic chronic kidney disease: Secondary | ICD-10-CM | POA: Diagnosis not present

## 2024-10-04 DIAGNOSIS — Z7982 Long term (current) use of aspirin: Secondary | ICD-10-CM | POA: Diagnosis not present

## 2024-10-04 DIAGNOSIS — M1990 Unspecified osteoarthritis, unspecified site: Secondary | ICD-10-CM | POA: Diagnosis not present

## 2024-10-04 DIAGNOSIS — I5022 Chronic systolic (congestive) heart failure: Secondary | ICD-10-CM | POA: Diagnosis not present

## 2024-10-04 DIAGNOSIS — J849 Interstitial pulmonary disease, unspecified: Secondary | ICD-10-CM | POA: Diagnosis not present

## 2024-10-04 DIAGNOSIS — M11261 Other chondrocalcinosis, right knee: Secondary | ICD-10-CM | POA: Diagnosis not present

## 2024-10-04 DIAGNOSIS — I130 Hypertensive heart and chronic kidney disease with heart failure and stage 1 through stage 4 chronic kidney disease, or unspecified chronic kidney disease: Secondary | ICD-10-CM | POA: Diagnosis not present

## 2024-10-04 DIAGNOSIS — G4733 Obstructive sleep apnea (adult) (pediatric): Secondary | ICD-10-CM | POA: Diagnosis not present

## 2024-10-04 DIAGNOSIS — Z7902 Long term (current) use of antithrombotics/antiplatelets: Secondary | ICD-10-CM | POA: Diagnosis not present

## 2024-10-04 DIAGNOSIS — E663 Overweight: Secondary | ICD-10-CM | POA: Diagnosis not present

## 2024-10-04 DIAGNOSIS — I2510 Atherosclerotic heart disease of native coronary artery without angina pectoris: Secondary | ICD-10-CM | POA: Diagnosis not present

## 2024-10-04 DIAGNOSIS — E1129 Type 2 diabetes mellitus with other diabetic kidney complication: Secondary | ICD-10-CM | POA: Diagnosis not present

## 2024-10-04 DIAGNOSIS — Z853 Personal history of malignant neoplasm of breast: Secondary | ICD-10-CM | POA: Diagnosis not present

## 2024-10-04 DIAGNOSIS — I252 Old myocardial infarction: Secondary | ICD-10-CM | POA: Diagnosis not present

## 2024-10-04 DIAGNOSIS — Z955 Presence of coronary angioplasty implant and graft: Secondary | ICD-10-CM | POA: Diagnosis not present

## 2024-10-04 DIAGNOSIS — J4489 Other specified chronic obstructive pulmonary disease: Secondary | ICD-10-CM | POA: Diagnosis not present

## 2024-10-05 DIAGNOSIS — I252 Old myocardial infarction: Secondary | ICD-10-CM | POA: Diagnosis not present

## 2024-10-05 DIAGNOSIS — M1990 Unspecified osteoarthritis, unspecified site: Secondary | ICD-10-CM | POA: Diagnosis not present

## 2024-10-05 DIAGNOSIS — Z955 Presence of coronary angioplasty implant and graft: Secondary | ICD-10-CM | POA: Diagnosis not present

## 2024-10-05 DIAGNOSIS — Z853 Personal history of malignant neoplasm of breast: Secondary | ICD-10-CM | POA: Diagnosis not present

## 2024-10-05 DIAGNOSIS — Z860100 Personal history of colon polyps, unspecified: Secondary | ICD-10-CM | POA: Diagnosis not present

## 2024-10-05 DIAGNOSIS — J849 Interstitial pulmonary disease, unspecified: Secondary | ICD-10-CM | POA: Diagnosis not present

## 2024-10-05 DIAGNOSIS — N1831 Chronic kidney disease, stage 3a: Secondary | ICD-10-CM | POA: Diagnosis not present

## 2024-10-05 DIAGNOSIS — E1169 Type 2 diabetes mellitus with other specified complication: Secondary | ICD-10-CM | POA: Diagnosis not present

## 2024-10-05 DIAGNOSIS — Z7902 Long term (current) use of antithrombotics/antiplatelets: Secondary | ICD-10-CM | POA: Diagnosis not present

## 2024-10-05 DIAGNOSIS — K219 Gastro-esophageal reflux disease without esophagitis: Secondary | ICD-10-CM | POA: Diagnosis not present

## 2024-10-05 DIAGNOSIS — J4489 Other specified chronic obstructive pulmonary disease: Secondary | ICD-10-CM | POA: Diagnosis not present

## 2024-10-05 DIAGNOSIS — E1122 Type 2 diabetes mellitus with diabetic chronic kidney disease: Secondary | ICD-10-CM | POA: Diagnosis not present

## 2024-10-05 DIAGNOSIS — G4733 Obstructive sleep apnea (adult) (pediatric): Secondary | ICD-10-CM | POA: Diagnosis not present

## 2024-10-05 DIAGNOSIS — E1129 Type 2 diabetes mellitus with other diabetic kidney complication: Secondary | ICD-10-CM | POA: Diagnosis not present

## 2024-10-05 DIAGNOSIS — E785 Hyperlipidemia, unspecified: Secondary | ICD-10-CM | POA: Diagnosis not present

## 2024-10-05 DIAGNOSIS — Z7982 Long term (current) use of aspirin: Secondary | ICD-10-CM | POA: Diagnosis not present

## 2024-10-05 DIAGNOSIS — I5022 Chronic systolic (congestive) heart failure: Secondary | ICD-10-CM | POA: Diagnosis not present

## 2024-10-05 DIAGNOSIS — I130 Hypertensive heart and chronic kidney disease with heart failure and stage 1 through stage 4 chronic kidney disease, or unspecified chronic kidney disease: Secondary | ICD-10-CM | POA: Diagnosis not present

## 2024-10-05 DIAGNOSIS — E663 Overweight: Secondary | ICD-10-CM | POA: Diagnosis not present

## 2024-10-05 DIAGNOSIS — I2510 Atherosclerotic heart disease of native coronary artery without angina pectoris: Secondary | ICD-10-CM | POA: Diagnosis not present

## 2024-10-05 DIAGNOSIS — M11261 Other chondrocalcinosis, right knee: Secondary | ICD-10-CM | POA: Diagnosis not present

## 2024-10-06 DIAGNOSIS — T82855A Stenosis of coronary artery stent, initial encounter: Secondary | ICD-10-CM | POA: Diagnosis not present

## 2024-10-06 DIAGNOSIS — I5032 Chronic diastolic (congestive) heart failure: Secondary | ICD-10-CM | POA: Diagnosis not present

## 2024-10-06 DIAGNOSIS — Z955 Presence of coronary angioplasty implant and graft: Secondary | ICD-10-CM | POA: Diagnosis not present

## 2024-10-06 DIAGNOSIS — I25110 Atherosclerotic heart disease of native coronary artery with unstable angina pectoris: Secondary | ICD-10-CM | POA: Diagnosis not present

## 2024-10-09 DIAGNOSIS — E1122 Type 2 diabetes mellitus with diabetic chronic kidney disease: Secondary | ICD-10-CM | POA: Diagnosis not present

## 2024-10-09 DIAGNOSIS — I130 Hypertensive heart and chronic kidney disease with heart failure and stage 1 through stage 4 chronic kidney disease, or unspecified chronic kidney disease: Secondary | ICD-10-CM | POA: Diagnosis not present

## 2024-10-09 DIAGNOSIS — Z853 Personal history of malignant neoplasm of breast: Secondary | ICD-10-CM | POA: Diagnosis not present

## 2024-10-09 DIAGNOSIS — E663 Overweight: Secondary | ICD-10-CM | POA: Diagnosis not present

## 2024-10-09 DIAGNOSIS — E785 Hyperlipidemia, unspecified: Secondary | ICD-10-CM | POA: Diagnosis not present

## 2024-10-09 DIAGNOSIS — Z860100 Personal history of colon polyps, unspecified: Secondary | ICD-10-CM | POA: Diagnosis not present

## 2024-10-09 DIAGNOSIS — G4733 Obstructive sleep apnea (adult) (pediatric): Secondary | ICD-10-CM | POA: Diagnosis not present

## 2024-10-09 DIAGNOSIS — Z7982 Long term (current) use of aspirin: Secondary | ICD-10-CM | POA: Diagnosis not present

## 2024-10-09 DIAGNOSIS — I2510 Atherosclerotic heart disease of native coronary artery without angina pectoris: Secondary | ICD-10-CM | POA: Diagnosis not present

## 2024-10-09 DIAGNOSIS — Z7902 Long term (current) use of antithrombotics/antiplatelets: Secondary | ICD-10-CM | POA: Diagnosis not present

## 2024-10-09 DIAGNOSIS — K219 Gastro-esophageal reflux disease without esophagitis: Secondary | ICD-10-CM | POA: Diagnosis not present

## 2024-10-09 DIAGNOSIS — E1129 Type 2 diabetes mellitus with other diabetic kidney complication: Secondary | ICD-10-CM | POA: Diagnosis not present

## 2024-10-09 DIAGNOSIS — E1169 Type 2 diabetes mellitus with other specified complication: Secondary | ICD-10-CM | POA: Diagnosis not present

## 2024-10-09 DIAGNOSIS — M11261 Other chondrocalcinosis, right knee: Secondary | ICD-10-CM | POA: Diagnosis not present

## 2024-10-09 DIAGNOSIS — J849 Interstitial pulmonary disease, unspecified: Secondary | ICD-10-CM | POA: Diagnosis not present

## 2024-10-09 DIAGNOSIS — N1831 Chronic kidney disease, stage 3a: Secondary | ICD-10-CM | POA: Diagnosis not present

## 2024-10-09 DIAGNOSIS — I252 Old myocardial infarction: Secondary | ICD-10-CM | POA: Diagnosis not present

## 2024-10-09 DIAGNOSIS — I5022 Chronic systolic (congestive) heart failure: Secondary | ICD-10-CM | POA: Diagnosis not present

## 2024-10-09 DIAGNOSIS — J4489 Other specified chronic obstructive pulmonary disease: Secondary | ICD-10-CM | POA: Diagnosis not present

## 2024-10-09 DIAGNOSIS — Z955 Presence of coronary angioplasty implant and graft: Secondary | ICD-10-CM | POA: Diagnosis not present

## 2024-10-09 DIAGNOSIS — M1990 Unspecified osteoarthritis, unspecified site: Secondary | ICD-10-CM | POA: Diagnosis not present

## 2024-10-18 DIAGNOSIS — Z7902 Long term (current) use of antithrombotics/antiplatelets: Secondary | ICD-10-CM | POA: Diagnosis not present

## 2024-10-18 DIAGNOSIS — J849 Interstitial pulmonary disease, unspecified: Secondary | ICD-10-CM | POA: Diagnosis not present

## 2024-10-18 DIAGNOSIS — N1831 Chronic kidney disease, stage 3a: Secondary | ICD-10-CM | POA: Diagnosis not present

## 2024-10-18 DIAGNOSIS — Z7982 Long term (current) use of aspirin: Secondary | ICD-10-CM | POA: Diagnosis not present

## 2024-10-18 DIAGNOSIS — Z860100 Personal history of colon polyps, unspecified: Secondary | ICD-10-CM | POA: Diagnosis not present

## 2024-10-18 DIAGNOSIS — I252 Old myocardial infarction: Secondary | ICD-10-CM | POA: Diagnosis not present

## 2024-10-18 DIAGNOSIS — Z853 Personal history of malignant neoplasm of breast: Secondary | ICD-10-CM | POA: Diagnosis not present

## 2024-10-18 DIAGNOSIS — M11261 Other chondrocalcinosis, right knee: Secondary | ICD-10-CM | POA: Diagnosis not present

## 2024-10-18 DIAGNOSIS — K219 Gastro-esophageal reflux disease without esophagitis: Secondary | ICD-10-CM | POA: Diagnosis not present

## 2024-10-18 DIAGNOSIS — J4489 Other specified chronic obstructive pulmonary disease: Secondary | ICD-10-CM | POA: Diagnosis not present

## 2024-10-18 DIAGNOSIS — Z955 Presence of coronary angioplasty implant and graft: Secondary | ICD-10-CM | POA: Diagnosis not present

## 2024-10-18 DIAGNOSIS — I5022 Chronic systolic (congestive) heart failure: Secondary | ICD-10-CM | POA: Diagnosis not present

## 2024-10-18 DIAGNOSIS — E1129 Type 2 diabetes mellitus with other diabetic kidney complication: Secondary | ICD-10-CM | POA: Diagnosis not present

## 2024-10-18 DIAGNOSIS — I130 Hypertensive heart and chronic kidney disease with heart failure and stage 1 through stage 4 chronic kidney disease, or unspecified chronic kidney disease: Secondary | ICD-10-CM | POA: Diagnosis not present

## 2024-10-18 DIAGNOSIS — E785 Hyperlipidemia, unspecified: Secondary | ICD-10-CM | POA: Diagnosis not present

## 2024-10-18 DIAGNOSIS — G4733 Obstructive sleep apnea (adult) (pediatric): Secondary | ICD-10-CM | POA: Diagnosis not present

## 2024-10-18 DIAGNOSIS — E1169 Type 2 diabetes mellitus with other specified complication: Secondary | ICD-10-CM | POA: Diagnosis not present

## 2024-10-18 DIAGNOSIS — E663 Overweight: Secondary | ICD-10-CM | POA: Diagnosis not present

## 2024-10-18 DIAGNOSIS — I2510 Atherosclerotic heart disease of native coronary artery without angina pectoris: Secondary | ICD-10-CM | POA: Diagnosis not present

## 2024-10-18 DIAGNOSIS — M1990 Unspecified osteoarthritis, unspecified site: Secondary | ICD-10-CM | POA: Diagnosis not present

## 2024-10-18 DIAGNOSIS — E1122 Type 2 diabetes mellitus with diabetic chronic kidney disease: Secondary | ICD-10-CM | POA: Diagnosis not present

## 2024-10-20 NOTE — Progress Notes (Signed)
 Stacie Mcdonald                                          MRN: [id number]   10/20/2024   The [facility]BC[facility] Quality Team Specialist reviewed this patient medical record for the purposes of chart review for care gap closure. The following were reviewed: abstraction for care gap closure-kidney health evaluation for diabetes:eGFR  and uACR.    VBCI Quality Team

## 2024-10-25 ENCOUNTER — Other Ambulatory Visit: Payer: Self-pay | Admitting: Internal Medicine

## 2024-10-25 DIAGNOSIS — E1129 Type 2 diabetes mellitus with other diabetic kidney complication: Secondary | ICD-10-CM | POA: Diagnosis not present

## 2024-10-25 DIAGNOSIS — Z853 Personal history of malignant neoplasm of breast: Secondary | ICD-10-CM | POA: Diagnosis not present

## 2024-10-25 DIAGNOSIS — M11261 Other chondrocalcinosis, right knee: Secondary | ICD-10-CM | POA: Diagnosis not present

## 2024-10-25 DIAGNOSIS — Z7902 Long term (current) use of antithrombotics/antiplatelets: Secondary | ICD-10-CM | POA: Diagnosis not present

## 2024-10-25 DIAGNOSIS — E785 Hyperlipidemia, unspecified: Secondary | ICD-10-CM | POA: Diagnosis not present

## 2024-10-25 DIAGNOSIS — I2510 Atherosclerotic heart disease of native coronary artery without angina pectoris: Secondary | ICD-10-CM | POA: Diagnosis not present

## 2024-10-25 DIAGNOSIS — G4733 Obstructive sleep apnea (adult) (pediatric): Secondary | ICD-10-CM | POA: Diagnosis not present

## 2024-10-25 DIAGNOSIS — Z860100 Personal history of colon polyps, unspecified: Secondary | ICD-10-CM | POA: Diagnosis not present

## 2024-10-25 DIAGNOSIS — N1831 Chronic kidney disease, stage 3a: Secondary | ICD-10-CM | POA: Diagnosis not present

## 2024-10-25 DIAGNOSIS — M1990 Unspecified osteoarthritis, unspecified site: Secondary | ICD-10-CM | POA: Diagnosis not present

## 2024-10-25 DIAGNOSIS — I252 Old myocardial infarction: Secondary | ICD-10-CM | POA: Diagnosis not present

## 2024-10-25 DIAGNOSIS — K219 Gastro-esophageal reflux disease without esophagitis: Secondary | ICD-10-CM | POA: Diagnosis not present

## 2024-10-25 DIAGNOSIS — E663 Overweight: Secondary | ICD-10-CM | POA: Diagnosis not present

## 2024-10-25 DIAGNOSIS — E1169 Type 2 diabetes mellitus with other specified complication: Secondary | ICD-10-CM | POA: Diagnosis not present

## 2024-10-25 DIAGNOSIS — Z7982 Long term (current) use of aspirin: Secondary | ICD-10-CM | POA: Diagnosis not present

## 2024-10-25 DIAGNOSIS — I130 Hypertensive heart and chronic kidney disease with heart failure and stage 1 through stage 4 chronic kidney disease, or unspecified chronic kidney disease: Secondary | ICD-10-CM | POA: Diagnosis not present

## 2024-10-25 DIAGNOSIS — Z955 Presence of coronary angioplasty implant and graft: Secondary | ICD-10-CM | POA: Diagnosis not present

## 2024-10-25 DIAGNOSIS — J4489 Other specified chronic obstructive pulmonary disease: Secondary | ICD-10-CM | POA: Diagnosis not present

## 2024-10-25 DIAGNOSIS — E1122 Type 2 diabetes mellitus with diabetic chronic kidney disease: Secondary | ICD-10-CM | POA: Diagnosis not present

## 2024-10-25 DIAGNOSIS — I5022 Chronic systolic (congestive) heart failure: Secondary | ICD-10-CM | POA: Diagnosis not present

## 2024-10-25 DIAGNOSIS — J849 Interstitial pulmonary disease, unspecified: Secondary | ICD-10-CM | POA: Diagnosis not present

## 2024-10-26 ENCOUNTER — Ambulatory Visit: Admitting: Internal Medicine

## 2024-10-30 DIAGNOSIS — E1122 Type 2 diabetes mellitus with diabetic chronic kidney disease: Secondary | ICD-10-CM | POA: Diagnosis not present

## 2024-10-30 DIAGNOSIS — Z955 Presence of coronary angioplasty implant and graft: Secondary | ICD-10-CM | POA: Diagnosis not present

## 2024-10-30 DIAGNOSIS — Z7982 Long term (current) use of aspirin: Secondary | ICD-10-CM | POA: Diagnosis not present

## 2024-10-30 DIAGNOSIS — Z860100 Personal history of colon polyps, unspecified: Secondary | ICD-10-CM | POA: Diagnosis not present

## 2024-10-30 DIAGNOSIS — J849 Interstitial pulmonary disease, unspecified: Secondary | ICD-10-CM | POA: Diagnosis not present

## 2024-10-30 DIAGNOSIS — Z7902 Long term (current) use of antithrombotics/antiplatelets: Secondary | ICD-10-CM | POA: Diagnosis not present

## 2024-10-30 DIAGNOSIS — E1169 Type 2 diabetes mellitus with other specified complication: Secondary | ICD-10-CM | POA: Diagnosis not present

## 2024-10-30 DIAGNOSIS — M11261 Other chondrocalcinosis, right knee: Secondary | ICD-10-CM | POA: Diagnosis not present

## 2024-10-30 DIAGNOSIS — E663 Overweight: Secondary | ICD-10-CM | POA: Diagnosis not present

## 2024-10-30 DIAGNOSIS — I5022 Chronic systolic (congestive) heart failure: Secondary | ICD-10-CM | POA: Diagnosis not present

## 2024-10-30 DIAGNOSIS — E1129 Type 2 diabetes mellitus with other diabetic kidney complication: Secondary | ICD-10-CM | POA: Diagnosis not present

## 2024-10-30 DIAGNOSIS — J4489 Other specified chronic obstructive pulmonary disease: Secondary | ICD-10-CM | POA: Diagnosis not present

## 2024-10-30 DIAGNOSIS — N1831 Chronic kidney disease, stage 3a: Secondary | ICD-10-CM | POA: Diagnosis not present

## 2024-10-30 DIAGNOSIS — Z853 Personal history of malignant neoplasm of breast: Secondary | ICD-10-CM | POA: Diagnosis not present

## 2024-10-30 DIAGNOSIS — G4733 Obstructive sleep apnea (adult) (pediatric): Secondary | ICD-10-CM | POA: Diagnosis not present

## 2024-10-30 DIAGNOSIS — I2510 Atherosclerotic heart disease of native coronary artery without angina pectoris: Secondary | ICD-10-CM | POA: Diagnosis not present

## 2024-10-30 DIAGNOSIS — I130 Hypertensive heart and chronic kidney disease with heart failure and stage 1 through stage 4 chronic kidney disease, or unspecified chronic kidney disease: Secondary | ICD-10-CM | POA: Diagnosis not present

## 2024-10-30 DIAGNOSIS — I252 Old myocardial infarction: Secondary | ICD-10-CM | POA: Diagnosis not present

## 2024-10-30 DIAGNOSIS — M1990 Unspecified osteoarthritis, unspecified site: Secondary | ICD-10-CM | POA: Diagnosis not present

## 2024-10-30 DIAGNOSIS — E785 Hyperlipidemia, unspecified: Secondary | ICD-10-CM | POA: Diagnosis not present

## 2024-10-30 DIAGNOSIS — K219 Gastro-esophageal reflux disease without esophagitis: Secondary | ICD-10-CM | POA: Diagnosis not present

## 2024-11-22 ENCOUNTER — Ambulatory Visit

## 2024-11-22 ENCOUNTER — Ambulatory Visit: Admitting: Internal Medicine

## 2024-11-22 ENCOUNTER — Encounter: Payer: Self-pay | Admitting: Internal Medicine

## 2024-11-22 VITALS — BP 122/60 | HR 91 | Temp 100.8°F | Ht 64.0 in | Wt 168.0 lb

## 2024-11-22 DIAGNOSIS — J849 Interstitial pulmonary disease, unspecified: Secondary | ICD-10-CM

## 2024-11-22 DIAGNOSIS — R051 Acute cough: Secondary | ICD-10-CM | POA: Diagnosis not present

## 2024-11-22 DIAGNOSIS — E785 Hyperlipidemia, unspecified: Secondary | ICD-10-CM | POA: Diagnosis not present

## 2024-11-22 DIAGNOSIS — D124 Benign neoplasm of descending colon: Secondary | ICD-10-CM

## 2024-11-22 DIAGNOSIS — Z7984 Long term (current) use of oral hypoglycemic drugs: Secondary | ICD-10-CM | POA: Diagnosis not present

## 2024-11-22 DIAGNOSIS — R809 Proteinuria, unspecified: Secondary | ICD-10-CM

## 2024-11-22 DIAGNOSIS — E1129 Type 2 diabetes mellitus with other diabetic kidney complication: Secondary | ICD-10-CM

## 2024-11-22 DIAGNOSIS — I2510 Atherosclerotic heart disease of native coronary artery without angina pectoris: Secondary | ICD-10-CM

## 2024-11-22 DIAGNOSIS — M11261 Other chondrocalcinosis, right knee: Secondary | ICD-10-CM | POA: Diagnosis not present

## 2024-11-22 DIAGNOSIS — E119 Type 2 diabetes mellitus without complications: Secondary | ICD-10-CM

## 2024-11-22 DIAGNOSIS — E1169 Type 2 diabetes mellitus with other specified complication: Secondary | ICD-10-CM

## 2024-11-22 DIAGNOSIS — E1159 Type 2 diabetes mellitus with other circulatory complications: Secondary | ICD-10-CM

## 2024-11-22 DIAGNOSIS — N1831 Chronic kidney disease, stage 3a: Secondary | ICD-10-CM | POA: Diagnosis not present

## 2024-11-22 LAB — LDL CHOLESTEROL, DIRECT: Direct LDL: 53 mg/dL

## 2024-11-22 LAB — COMPREHENSIVE METABOLIC PANEL WITH GFR
ALT: 15 U/L (ref 3–35)
AST: 16 U/L (ref 5–37)
Albumin: 4.5 g/dL (ref 3.5–5.2)
Alkaline Phosphatase: 54 U/L (ref 39–117)
BUN: 17 mg/dL (ref 6–23)
CO2: 29 meq/L (ref 19–32)
Calcium: 9.2 mg/dL (ref 8.4–10.5)
Chloride: 99 meq/L (ref 96–112)
Creatinine, Ser: 0.85 mg/dL (ref 0.40–1.20)
GFR: 64.95 mL/min
Glucose, Bld: 104 mg/dL — ABNORMAL HIGH (ref 70–99)
Potassium: 3.9 meq/L (ref 3.5–5.1)
Sodium: 138 meq/L (ref 135–145)
Total Bilirubin: 0.5 mg/dL (ref 0.2–1.2)
Total Protein: 6.9 g/dL (ref 6.0–8.3)

## 2024-11-22 LAB — POCT INFLUENZA A/B
Influenza A, POC: NEGATIVE
Influenza B, POC: NEGATIVE

## 2024-11-22 LAB — LIPID PANEL
Cholesterol: 101 mg/dL (ref 28–200)
HDL: 38 mg/dL — ABNORMAL LOW
LDL Cholesterol: 37 mg/dL (ref 10–99)
NonHDL: 62.85
Total CHOL/HDL Ratio: 3
Triglycerides: 131 mg/dL (ref 10.0–149.0)
VLDL: 26.2 mg/dL (ref 0.0–40.0)

## 2024-11-22 LAB — MICROALBUMIN / CREATININE URINE RATIO
Creatinine,U: 54.3 mg/dL
Microalb Creat Ratio: 32.3 mg/g — ABNORMAL HIGH (ref 0.0–30.0)
Microalb, Ur: 1.8 mg/dL (ref 0.7–1.9)

## 2024-11-22 LAB — POC COVID19 BINAXNOW: SARS Coronavirus 2 Ag: NEGATIVE

## 2024-11-22 LAB — HEMOGLOBIN A1C: Hgb A1c MFr Bld: 6.1 % (ref 4.6–6.5)

## 2024-11-22 MED ORDER — TRAZODONE HCL 150 MG PO TABS: 150.0000 mg | ORAL_TABLET | Freq: Every day | ORAL | 1 refills | Status: AC

## 2024-11-22 MED ORDER — PANTOPRAZOLE SODIUM 40 MG PO TBEC: 40.0000 mg | DELAYED_RELEASE_TABLET | Freq: Every day | ORAL | 1 refills | Status: AC

## 2024-11-22 MED ORDER — SPIRONOLACTONE 25 MG PO TABS: 25.0000 mg | ORAL_TABLET | Freq: Every day | ORAL | 1 refills | Status: AC

## 2024-11-22 MED ORDER — METOPROLOL SUCCINATE ER 200 MG PO TB24: ORAL_TABLET | ORAL | 1 refills | Status: AC

## 2024-11-22 MED ORDER — PREDNISONE 10 MG PO TABS: ORAL_TABLET | ORAL | 0 refills | Status: AC

## 2024-11-22 NOTE — Progress Notes (Signed)
 "  Subjective:  Patient ID: Stacie Mcdonald, female    DOB: October 27, 1946  Age: 79 y.o. MRN: [id number]  CC: The primary encounter diagnosis was Acute cough. Diagnoses of Diabetes mellitus treated with oral medication (HCC), Hyperlipidemia associated with type 2 diabetes mellitus (HCC), Type 2 diabetes mellitus with diabetic microalbuminuria, unspecified whether long term insulin  use (HCC), Pseudogout of knee, right, Adenomatous polyp of descending colon, Pseudogout of right knee, Interstitial pulmonary disease (HCC), Stage 3a chronic kidney disease (HCC), and Coronary artery disease due to type 2 diabetes mellitus (HCC) were also pertinent to this visit.   HPI Stacie Mcdonald presents for  Chief Complaint  Patient presents with   Medical Management of Chronic Issues    3 month follow up on diabetes  1) CAD: Aki was admitted TO [facility] on sept 19  FOR NSTEMI ,  s/p PCI with DES to proximal ostial 90% lesion and DCB to 99% mRCA lesion.  felt well for ~ a week and a half and but presented ro [facility] cardiologist for follow up with recurrent angina and  readmitted on Oct 21 for coronary angiography in setting of recurrent angina with concern for  stent thrombosis of recently placed proximal RCA stent and moderate severe ISR in the mid and distal RCA. She is S/p POBA with noncompliant balloon and DCB of the entire RCA and medication was changed  from Clopidogrel  to Prasugrel  (Effient ) 5 mg once daily (dose adjusted for age).  During follow up with Dr Florencio o nNov 21 , it appears that Effient  had already been  changed to Plavix     2) Admitted to [facility] Oct 22 FOR SEVERE RIGHT KNEE PAIN.  TURNED AWAY BY [facility]; WENT TO [facility] AND SENT TO ER BY [facility] /  OCT 22-23.OFR CONCERN FOR SEPTIC JOINT.   JOINT   ASPIRATION DONE,  CALCIUM  PYROPHOSPHATE CRYSTALS CONSISTENT WITH PSEUDOGOUT.  NO PREVENTION PRESCRIBED,  NO FLARES SINCE THEN . WAS NOT REFERRED TO RHEUMATOLOGY OR OTHOPEDICS .   HAS  HAD 2 SIMILAR EPISODES IN THE PAST THAT WERE NOT DIAGNOSED OR TREATED.  THIS WAS THE WORST.   3) 3 WEEK HISTORY  OF URI.  STARTED WITH SORE THROAT,  FOLLOWED BY A PERSISTENT COUGH FOR THE PAST 3 WEEKS,  NON PRODUCTIVE,  WORSE LYING DOWN.  LOTS OF RHINORRHEA WHICH IS MOSTLY CLEAR BUT OCCASIONALLY  GREEN ,  SOME  SUBJECTIVE FEVERS EARLY ONE .  T DAY IS 100.8    4) TYPE 2 dm: T2DM:  She  feels generally well,  But is not  exercising regularly DU to recent NSTEMI and current URI. Checking  blood sugars less than once daily at variable times, usually only if she feels she may be having a hypoglycemic event. .  BS have been under 130 fasting and < 150 post prandially.  Denies any recent hypoglyemic events.  Taking   medications as directed. Following a carbohydrate modified diet 6 days per week. Denies numbness, burning and tingling of extremities. Appetite is good.     5) WANTS TO SEE [facility] FOR FOLLOW UP COLONOSCOPY ,    Outpatient Medications Prior to Visit  Medication Sig Dispense Refill   albuterol  (VENTOLIN  HFA) 108 (90 Base) MCG/ACT inhaler Inhale 2 puffs into the lungs every 6 (six) hours as needed. Using in Am daily  per MD     aspirin  81 MG tablet Take 81 mg by mouth daily.     clopidogrel  (PLAVIX )  75 MG tablet Take 75 mg by mouth daily.     D-MANNOSE PO Take 800 mg by mouth daily.     ELDERBERRY PO Take 1,000 mg by mouth daily.     furosemide  (LASIX ) 40 MG tablet Take 40 mg by mouth daily.     GLUCOSAMINE-CHONDROITIN PO Take 2 tablets by mouth daily.     glucose blood (CONTOUR NEXT TEST) test strip Use to check blood sugar twice daily. 100 each 12   isosorbide  mononitrate (IMDUR ) 30 MG 24 hr tablet Take 30 mg by mouth daily.     JARDIANCE  25 MG TABS tablet TAKE 1 TABLET BY MOUTH DAILY BEFORE BREAKFAST 30 tablet 5   Lancets (FREESTYLE) lancets To check blood sugars once daily 100 each 3   loratadine  (CLARITIN ) 10 MG tablet Take 10 mg by mouth daily as needed for allergies.      nitroGLYCERIN  (NITROSTAT ) 0.4 MG SL tablet Place 0.4 mg under the tongue every 5 (five) minutes as needed for chest pain.     PRALUENT 75 MG/ML SOAJ Inject 75 mg into the muscle every 14 (fourteen) days.     ranolazine  (RANEXA ) 1000 MG SR tablet Take 1,000 mg by mouth 2 (two) times daily.     sacubitril -valsartan  (ENTRESTO ) 49-51 MG Take 1 tablet by mouth 2 (two) times daily.     metoprolol  (TOPROL -XL) 200 MG 24 hr tablet TAKE 1 TABLET BY MOUTH DAILY. 90 tablet 1   pantoprazole  (PROTONIX ) 40 MG tablet Take 1 tablet (40 mg total) by mouth daily. 90 tablet 1   prasugrel  (EFFIENT ) 5 MG TABS tablet Take 5 mg by mouth daily.     spironolactone  (ALDACTONE ) 25 MG tablet Take 1 tablet (25 mg total) by mouth daily. 90 tablet 1   traZODone  (DESYREL ) 150 MG tablet Take 1 tablet (150 mg total) by mouth at bedtime. 90 tablet 1   No facility-administered medications prior to visit.    Review of Systems;  Patient denies headache, fevers, malaise, unintentional weight loss, skin rash, eye pain, sinus congestion and sinus pain, sore throat, dysphagia,  hemoptysis , cough, dyspnea, wheezing, chest pain, palpitations, orthopnea, edema, abdominal pain, nausea, melena, diarrhea, constipation, flank pain, dysuria, hematuria, urinary  Frequency, nocturia, numbness, tingling, seizures,  Focal weakness, Loss of consciousness,  Tremor, insomnia, depression, anxiety, and suicidal ideation.      Objective:  BP 122/60 (BP Location: Left Arm)   Pulse 91   Temp (!) 100.8 F (38.2 C)   Ht 5' 4 (1.626 m)   Wt 168 lb (76.2 kg)   SpO2 93%   BMI 28.84 kg/m   BP Readings from Last 3 Encounters:  11/22/24 122/60  09/07/24 (!) 102/50  08/14/24 120/70    Wt Readings from Last 3 Encounters:  11/22/24 168 lb (76.2 kg)  09/06/24 171 lb 8.3 oz (77.8 kg)  08/14/24 171 lb 9.6 oz (77.8 kg)    Physical Exam Vitals reviewed.  Constitutional:      General: She is not in acute distress.    Appearance: Normal appearance.  She is normal weight. She is not ill-appearing, toxic-appearing or diaphoretic.  HENT:     Head: Normocephalic.  Eyes:     General: No scleral icterus.       Right eye: No discharge.        Left eye: No discharge.     Conjunctiva/sclera: Conjunctivae normal.  Musculoskeletal:        General: Normal range of motion.  Skin:  General: Skin is warm and dry.  Neurological:     General: No focal deficit present.     Mental Status: She is alert and oriented to person, place, and time. Mental status is at baseline.  Psychiatric:        Mood and Affect: Mood normal.        Behavior: Behavior normal.        Thought Content: Thought content normal.        Judgment: Judgment normal.     Lab Results  Component Value Date   HGBA1C 6.1 11/22/2024   HGBA1C 6.5 08/14/2024   HGBA1C 6.2 04/26/2024    Lab Results  Component Value Date   CREATININE 0.85 11/22/2024   CREATININE 0.80 09/06/2024   CREATININE 0.84 08/14/2024    Lab Results  Component Value Date   WBC 9.0 09/07/2024   HGB 12.8 09/07/2024   HCT 39.5 09/07/2024   PLT 235 09/07/2024   GLUCOSE 104 (H) 11/22/2024   CHOL 101 11/22/2024   TRIG 131.0 11/22/2024   HDL 38.00 (L) 11/22/2024   LDLDIRECT 53.0 11/22/2024   LDLCALC 37 11/22/2024   ALT 15 11/22/2024   AST 16 11/22/2024   NA 138 11/22/2024   K 3.9 11/22/2024   CL 99 11/22/2024   CREATININE 0.85 11/22/2024   BUN 17 11/22/2024   CO2 29 11/22/2024   TSH 3.11 08/17/2023   INR 1.0 09/06/2024   HGBA1C 6.1 11/22/2024   MICROALBUR 1.8 11/22/2024    US  Venous Img Lower Unilateral Right Result Date: 09/06/2024 CLINICAL DATA:  R leg swelling, r/o DVT EXAM: RIGHT LOWER EXTREMITY VENOUS DOPPLER ULTRASOUND TECHNIQUE: Gray-scale sonography with graded compression, as well as color Doppler and duplex ultrasound were performed to evaluate the lower extremity deep venous systems from the level of the common femoral vein and including the common femoral, femoral, profunda  femoral, popliteal and calf veins including the posterior tibial, peroneal and gastrocnemius veins when visible. The superficial great saphenous vein was also interrogated. Spectral Doppler was utilized to evaluate flow at rest and with distal augmentation maneuvers in the common femoral, femoral and popliteal veins. COMPARISON:  February 27, 2021 FINDINGS: Contralateral Common Femoral Vein: Respiratory phasicity is normal and symmetric with the symptomatic side. No evidence of thrombus. Normal compressibility. Common Femoral Vein: No evidence of thrombus. Normal compressibility, respiratory phasicity and response to augmentation. Saphenofemoral Junction: No evidence of thrombus. Normal compressibility and flow on color Doppler imaging. Profunda Femoral Vein: No evidence of thrombus. Normal compressibility and flow on color Doppler imaging. Femoral Vein: No evidence of thrombus. Normal compressibility, respiratory phasicity and response to augmentation. Popliteal Vein: No evidence of thrombus. Normal compressibility, respiratory phasicity and response to augmentation. Calf Veins: No evidence of thrombus. Normal compressibility and flow on color Doppler imaging. Superficial Great Saphenous Vein: No evidence of thrombus. Normal compressibility. Other Findings: Fluid collection in the right popliteal fossa measuring 3.2 x 1.2 x 3.7 cm, possibly a Baker's cyst. IMPRESSION: Negative for deep venous thrombosis in the right leg. Electronically Signed   By: Rogelia Myers M.D.   On: 09/06/2024 18:42   DG Knee Complete 4 Views Right Result Date: 09/06/2024 CLINICAL DATA:  Right knee pain unable to bear weight concern for infection EXAM: RIGHT KNEE - COMPLETE 4+ VIEW COMPARISON:  None Available. FINDINGS: No fracture or malalignment. Tricompartment arthritis of the knee, worst involving the patellofemoral joint space. No definitive effusion. Mild joint space calcification consistent with chondrocalcinosis. IMPRESSION: No  acute osseous abnormality. Tricompartment  arthritis of the knee, worst involving the patellofemoral joint space. Chondrocalcinosis. Cross-sectional imaging follow-up as appropriate given clinical history Electronically Signed   By: Luke Bun M.D.   On: 09/06/2024 15:25    Assessment & Plan:  .Acute cough -     POC COVID-19 BinaxNow -     POCT Influenza A/B -     DG Chest 2 View; Future  Diabetes mellitus treated with oral medication (HCC) Assessment & Plan: Continue jardiance  25 mg daily  she has not increased dose to 30 mg daily (10 mg x 3) per [facility] . 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    Lab Results  Component Value Date   HGBA1C 6.1 11/22/2024   Lab Results  Component Value Date   MICROALBUR 1.8 11/22/2024   MICROALBUR 0.8 01/18/2024   Lab Results  Component Value Date   NA 138 11/22/2024   K 3.9 11/22/2024   CL 99 11/22/2024   CO2 29 11/22/2024       Orders: -     Hemoglobin A1c  Hyperlipidemia associated with type 2 diabetes mellitus (HCC) -     Comprehensive metabolic panel with GFR -     LDL cholesterol, direct -     Lipid panel  Type 2 diabetes mellitus with diabetic microalbuminuria, unspecified whether long term insulin  use (HCC) Assessment & Plan: Taking jardiance    Lab Results  Component Value Date   HGBA1C 6.5 08/14/2024     Orders: -     Microalbumin / creatinine urine ratio  Pseudogout of knee, right -     PTH, intact and calcium   Adenomatous polyp of descending colon -     Ambulatory referral to Gastroenterology  Pseudogout of right knee Assessment & Plan: Recent hospitalization  fluid analysis reviewed.  She is currently asymptomatic    Interstitial pulmonary disease (HCC) Assessment & Plan: Given her cough x 3 weeks will treat emirically with abx and steroids.  Chest x ray without infiltrate based on my read    Stage 3a chronic kidney disease (HCC) Assessment & Plan: GFR has been > 60 ml/min since  October 2024.  Continue ARB and jardiance . Post catheterization cr is fortunately stable  Lab Results  Component Value Date   MICROALBUR 1.8 11/22/2024   MICROALBUR 0.8 01/18/2024     Lab Results  Component Value Date   CREATININE 0.85 11/22/2024      Coronary artery disease due to type 2 diabetes mellitus [facility]) Assessment & Plan: Recent NSTEMI, Sept 2025 admission reviewed.  In stent thrombosis of new RCA stent managed with thombectomy.    Other orders -     Metoprolol  Succinate ER; TAKE 1 TABLET BY MOUTH DAILY.  Dispense: 90 tablet; Refill: 1 -     Pantoprazole  Sodium; Take 1 tablet (40 mg total) by mouth daily.  Dispense: 90 tablet; Refill: 1 -     Spironolactone ; Take 1 tablet (25 mg total) by mouth daily.  Dispense: 90 tablet; Refill: 1 -     traZODone  HCl; Take 1 tablet (150 mg total) by mouth at bedtime.  Dispense: 90 tablet; Refill: 1 -     predniSONE ; 6 tablets daily for 3 days, then reduce by 1 tablet daily until gone  Dispense: 33 tablet; Refill: 0     I spent 44 minutes on the day of this face to face encounter reviewing patient's  last 2 hospitalizations. most recent visit with cardiology,   recent  labs and imaging studies, counseling on  diabetes mnagement,  reviewing the assessment and plan with patient, and post visit ordering and reviewing of  diagnostics and therapeutics with patient  .   Follow-up: Return in about 6 months (around 05/22/2025).   Verneita LITTIE Kettering, MD "

## 2024-11-22 NOTE — Patient Instructions (Signed)
 PLEASE GO TO [facility] FOR CHEST X RAY TODAY  I WILL MAKE THE GI REFERRAL TO WHOEVER YOUR HUSBAND SAW  PLEASE CONFIRM THAT YOU HAVE REFILLS ON YOUR PLAVIX   PREDNISONE  PROLONGED TAPER SENT TO [facility] FOR NEXT PSEUDOGOUT FLARE

## 2024-11-22 NOTE — Assessment & Plan Note (Signed)
 Taking jardiance    Lab Results  Component Value Date   HGBA1C 6.5 08/14/2024

## 2024-11-23 ENCOUNTER — Ambulatory Visit: Payer: Self-pay | Admitting: Internal Medicine

## 2024-11-23 ENCOUNTER — Encounter: Payer: Self-pay | Admitting: Internal Medicine

## 2024-11-23 MED ORDER — AZITHROMYCIN 500 MG PO TABS: 500.0000 mg | ORAL_TABLET | Freq: Every day | ORAL | 0 refills | Status: AC

## 2024-11-23 NOTE — Assessment & Plan Note (Signed)
 Recent NSTEMI, Sept 2025 admission reviewed.  In stent thrombosis of new RCA stent managed with thombectomy.

## 2024-11-23 NOTE — Assessment & Plan Note (Signed)
 GFR has been > 60 ml/min since October 2024.  Continue ARB and jardiance . Post catheterization cr is fortunately stable  Lab Results  Component Value Date   MICROALBUR 1.8 11/22/2024   MICROALBUR 0.8 01/18/2024     Lab Results  Component Value Date   CREATININE 0.85 11/22/2024

## 2024-11-23 NOTE — Assessment & Plan Note (Addendum)
 Continue jardiance  25 mg daily  she has not increased dose to 30 mg daily (10 mg x 3) per [facility] . 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

## 2024-11-23 NOTE — Assessment & Plan Note (Signed)
 Recent hospitalization  fluid analysis reviewed.  She is currently asymptomatic

## 2024-11-23 NOTE — Assessment & Plan Note (Signed)
 Given her cough x 3 weeks will treat emirically with abx and steroids.  Chest x ray without infiltrate based on my read

## 2024-11-24 LAB — PTH, INTACT AND CALCIUM
Calcium: 9.2 mg/dL (ref 8.6–10.4)
PTH: 75 pg/mL (ref 16–77)

## 2025-05-22 ENCOUNTER — Ambulatory Visit

## 2025-05-30 ENCOUNTER — Ambulatory Visit: Admitting: Internal Medicine
